# Patient Record
Sex: Male | Born: 1955 | Race: Black or African American | Hispanic: No | Smoking: Current every day smoker
Health system: Southern US, Community
[De-identification: ages and names within clinical notes are randomized; demographics above are authoritative.]

## PROBLEM LIST (undated history)

## (undated) DIAGNOSIS — K209 Esophagitis, unspecified: Secondary | ICD-10-CM

## (undated) DIAGNOSIS — F329 Major depressive disorder, single episode, unspecified: Secondary | ICD-10-CM

## (undated) DIAGNOSIS — J449 Chronic obstructive pulmonary disease, unspecified: Secondary | ICD-10-CM

## (undated) DIAGNOSIS — J189 Pneumonia, unspecified organism: Secondary | ICD-10-CM

## (undated) DIAGNOSIS — M549 Dorsalgia, unspecified: Secondary | ICD-10-CM

## (undated) DIAGNOSIS — D649 Anemia, unspecified: Secondary | ICD-10-CM

## (undated) DIAGNOSIS — F32A Depression, unspecified: Secondary | ICD-10-CM

## (undated) DIAGNOSIS — J9859 Other diseases of mediastinum, not elsewhere classified: Secondary | ICD-10-CM

## (undated) DIAGNOSIS — N400 Enlarged prostate without lower urinary tract symptoms: Secondary | ICD-10-CM

## (undated) DIAGNOSIS — R351 Nocturia: Secondary | ICD-10-CM

## (undated) DIAGNOSIS — R197 Diarrhea, unspecified: Secondary | ICD-10-CM

## (undated) DIAGNOSIS — R339 Retention of urine, unspecified: Secondary | ICD-10-CM

## (undated) DIAGNOSIS — Z8709 Personal history of other diseases of the respiratory system: Secondary | ICD-10-CM

## (undated) DIAGNOSIS — M2550 Pain in unspecified joint: Secondary | ICD-10-CM

## (undated) DIAGNOSIS — R350 Frequency of micturition: Secondary | ICD-10-CM

## (undated) DIAGNOSIS — I10 Essential (primary) hypertension: Secondary | ICD-10-CM

## (undated) DIAGNOSIS — Z923 Personal history of irradiation: Secondary | ICD-10-CM

## (undated) DIAGNOSIS — Z9981 Dependence on supplemental oxygen: Secondary | ICD-10-CM

## (undated) DIAGNOSIS — F419 Anxiety disorder, unspecified: Secondary | ICD-10-CM

## (undated) DIAGNOSIS — R05 Cough: Secondary | ICD-10-CM

## (undated) DIAGNOSIS — R011 Cardiac murmur, unspecified: Secondary | ICD-10-CM

## (undated) DIAGNOSIS — G8929 Other chronic pain: Secondary | ICD-10-CM

## (undated) DIAGNOSIS — R0602 Shortness of breath: Secondary | ICD-10-CM

## (undated) DIAGNOSIS — C3490 Malignant neoplasm of unspecified part of unspecified bronchus or lung: Secondary | ICD-10-CM

## (undated) DIAGNOSIS — G629 Polyneuropathy, unspecified: Secondary | ICD-10-CM

## (undated) DIAGNOSIS — E1140 Type 2 diabetes mellitus with diabetic neuropathy, unspecified: Secondary | ICD-10-CM

## (undated) DIAGNOSIS — E119 Type 2 diabetes mellitus without complications: Secondary | ICD-10-CM

## (undated) HISTORY — DX: Malignant neoplasm of unspecified part of unspecified bronchus or lung: C34.90

## (undated) HISTORY — DX: Type 2 diabetes mellitus without complications: E11.9

## (undated) HISTORY — DX: Personal history of irradiation: Z92.3

## (undated) HISTORY — DX: Dorsalgia, unspecified: M54.9

## (undated) HISTORY — PX: COLONOSCOPY: SHX174

## (undated) HISTORY — DX: Type 2 diabetes mellitus with diabetic neuropathy, unspecified: E11.40

## (undated) HISTORY — DX: Esophagitis, unspecified: K20.9

## (undated) HISTORY — DX: Cough: R05

## (undated) HISTORY — PX: EXCISIONAL HEMORRHOIDECTOMY: SHX1541

---

## 2005-01-17 ENCOUNTER — Emergency Department (HOSPITAL_COMMUNITY): Admission: EM | Admit: 2005-01-17 | Discharge: 2005-01-17 | Payer: Self-pay | Admitting: Emergency Medicine

## 2005-07-03 ENCOUNTER — Ambulatory Visit: Payer: Self-pay | Admitting: Internal Medicine

## 2005-07-07 ENCOUNTER — Ambulatory Visit (HOSPITAL_COMMUNITY): Admission: RE | Admit: 2005-07-07 | Discharge: 2005-07-07 | Payer: Self-pay | Admitting: Internal Medicine

## 2005-08-06 ENCOUNTER — Ambulatory Visit: Payer: Self-pay | Admitting: Internal Medicine

## 2005-08-08 ENCOUNTER — Ambulatory Visit (HOSPITAL_COMMUNITY): Admission: RE | Admit: 2005-08-08 | Discharge: 2005-08-08 | Payer: Self-pay | Admitting: Internal Medicine

## 2005-09-09 ENCOUNTER — Ambulatory Visit: Payer: Self-pay | Admitting: Family Medicine

## 2005-09-16 ENCOUNTER — Ambulatory Visit: Payer: Self-pay | Admitting: *Deleted

## 2006-10-10 ENCOUNTER — Emergency Department (HOSPITAL_COMMUNITY): Admission: EM | Admit: 2006-10-10 | Discharge: 2006-10-10 | Payer: Self-pay | Admitting: Emergency Medicine

## 2006-11-15 ENCOUNTER — Inpatient Hospital Stay (HOSPITAL_COMMUNITY): Admission: EM | Admit: 2006-11-15 | Discharge: 2006-11-19 | Payer: Self-pay | Admitting: Emergency Medicine

## 2006-11-16 ENCOUNTER — Encounter (INDEPENDENT_AMBULATORY_CARE_PROVIDER_SITE_OTHER): Payer: Self-pay | Admitting: Cardiology

## 2006-11-16 ENCOUNTER — Encounter: Payer: Self-pay | Admitting: Vascular Surgery

## 2006-11-24 ENCOUNTER — Inpatient Hospital Stay (HOSPITAL_COMMUNITY): Admission: EM | Admit: 2006-11-24 | Discharge: 2006-11-28 | Payer: Self-pay | Admitting: Emergency Medicine

## 2009-07-28 ENCOUNTER — Emergency Department (HOSPITAL_COMMUNITY): Admission: EM | Admit: 2009-07-28 | Discharge: 2009-07-28 | Payer: Self-pay | Admitting: Emergency Medicine

## 2011-01-05 ENCOUNTER — Encounter: Payer: Self-pay | Admitting: Internal Medicine

## 2011-05-02 NOTE — Consult Note (Signed)
NAMEDRAPER, GALLON            ACCOUNT NO.:  [id number]   MEDICAL RECORD NO.:  [id number]          PATIENT TYPE:  INP   LOCATION:  3737                         FACILITY:  [facility]   PHYSICIAN:  Bevelyn Buckles. Champey, M.D.DATE OF BIRTH:  02/14/1956   DATE OF CONSULTATION:  DATE OF DISCHARGE:                                 CONSULTATION   REASON FOR CONSULT:  Stroke.   HPI:  Mr. Chura is a 55 year old African American male with no  significant past medical history who presents with the onset of slurred  speech and confusion today.  The patient was in the normal state of  health today when around 5:30 p.m., he had an episode of nausea and  vomiting and went to bed.  When he woke up from his nap had severely  dysarthric speech, difficulty getting words out.  The patient did not  notice anything wrong with his speech.  His speech has gradually  improved over time.  He denies any focal weakness, numbness, vision  changes, vertigo, swallowing problems, of loss of consciousness.   PAST MEDICAL HISTORY:  Positive for chronic back pain.   CURRENT MEDICATIONS:  None.   ALLERGIES:  NO KNOWN DRUG ALLERGIES.   FAMILY HISTORY:  Noncontributory.   SOCIAL HISTORY:  Positive for stroke and alcohol use.   REVIEW OF SYSTEMS:  Positive as per HPI.  Review of systems is negative  as per HPI in greater than 7 other organ systems.   EXAMINATION:  VITALS:  Temperature is 97, blood pressure is 100/60,  pulse is 80, respirations 18, O2 sat is 95%.  HEENT:  Normocephalic, atraumatic.  Extraocular muscles are intact.  Pupils are equal, round, and reactive to light.  NECK:  Supple.  No carotid bruits.  HEART:  Regular.  LUNGS:  Clear.  ABDOMEN:  Soft.  EXTREMITIES:  Show good pulses.  NEUROLOGICAL:  The patient is awake, alert, and oriented x3.  Language  is slightly dysarthric.  Cranial nerves II-XII are grossly intact.  Motor exam:  The patient has good strength in all 4 extremities.  The  patient has slight bilateral lower extremity drift on exam.  Sensory  examination is within normal limits to touch.  Reflexes are trace to 1+  throughout toes and neutral bilaterally.  Cerebellar function is within  normal limits, finger-to-nose and heel-to-shin.  Gait is not assessed  secondary to safety.   CT of the head showed no acute bleed with left frontal scalp lipoma.   LABS:  WBC is 10, hemoglobin 15, and hematocrit is 45.1, platelets  236,000.  Sodium is 140, potassium is 4.8, chloride is 109, CO2 is 25,  BUN 15, creatinine is 2.3, glucose is 92, calcium is 9.6.   IMPRESSION:  This is a 54 year old with numerous complaints including  nausea, slurred speech, and confusion, all of which have resolved or  improved.  The patient is not a candidate for IV-TPA, as he is greater  than 3 hours out from onset of symptoms.  The patient could possibly  have a small vessel stroke or possibly a small cerebellar stroke, that  could be  related to his symptoms.  I would recommend starting an aspirin  per day and I would check an MRI/MRA of the brain, carotid Doppler, 2-D  echocardiogram, lipids, and homocysteine level, get PT, OT, and Speech  consults, and keep the patient n.p.o. until clear swallow evaluation,  and place him on IV fluids.  I would also place him on DVT prophylaxis.  Chest x-ray showed possible right upper lobe pneumonia and antibiotics  are started.  I will also check a urinalysis and alcohol level.  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      Bevelyn Buckles. Nash Shearer, M.D.  Electronically Signed     DRC/MEDQ  D:  11/15/2006  T:  11/15/2006  Job:  [id number]
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
## 2011-05-02 NOTE — Procedures (Signed)
EEG [phone].   HISTORY:  This is a 55 year old patient who is being evaluated for new  onset slurred speech and confusion.  This is a routine EEG.  No skull  defects noted.   Medications include Lovenox, aspirin, Protonix, Ativan, Zocor, Atrovent,  Zithromax, Rocephin, Tylenol and Reglan.   EEG CLASSIFICATION:  Normal awake.   DESCRIPTION OF RECORDING:  The backgrounds of this recording consist of  a fairly well modulated medium amplitude alpha rhythm of 9 Hz that is  reactive to eye opening and closure.  As the record progresses, the  patient appears to remain in awakened state during the recording.  Photic stimulation is performed resulting in a bilateral and symmetric  photic driving response.  Hyperventilation is not performed.  At no time  during the recording does there appear to evidence of spikes, spike wave  discharges or evidence of focal slowing.  EKG monitor shows no evidence  of cardiac rhythm abnormalities.  Heart rate of 60.   IMPRESSION:  This is a normal EEG recording in the awakened state.  No  evidence of ictal or interictal discharges were seen.      Marlan Palau, M.D.  Electronically Signed     WJX:BJYN  D:  11/17/2006 21:53:23  T:  11/18/2006 13:42:28  Job #:  [id number]
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
## 2011-05-02 NOTE — H&P (Signed)
NAMEJAIYDEN, LAUR            ACCOUNT NO.:  [id number]   MEDICAL RECORD NO.:  [id number]          PATIENT TYPE:  INP   LOCATION:  3737                         FACILITY:  [facility]   PHYSICIAN:  Della Goo, M.D. DATE OF BIRTH:  Mar 15, 1956   DATE OF ADMISSION:  11/15/2006  DATE OF DISCHARGE:                              HISTORY & PHYSICAL   Patient of [facility]   CHIEF COMPLAINT:  Slurred speech and weakness.   HISTORY OF PRESENT ILLNESS:  This is a 55 year old male who complained  of having slurred speech and weakness, after he awakened from an  afternoon nap.  The patient had difficulty walking and weakness of the  right side of the body, prompting the patient's family to contact EMS  for transfer to the hospital and evaluation.  Prior to this, the patient  does report having episode of vomiting and reports vomiting a greenish-  type emesis.  He also reports having a dry cough for approximately a  month.  He denies having any headache or chest pain associated with  this.  He also denies having any shortness of breath, diarrhea,  associated with his symptoms.   The patient was evaluated in the emergency department, and neurology was  consulted.  The patient was seen by neurologist, Dr. Nash Shearer, who made  recommendations for further evaluation of the patient for a possible  posterior cerebellar infarct.  The patient had a CAT scan performed  which was negative for any acute intracranial hemorrhage or mass effect.  ST-T reveals a lipoma in the right frontal scalp area.   PAST MEDICAL HISTORY:  None.   MEDICATIONS:  None.   ALLERGIES:  NO KNOWN DRUG ALLERGIES.   SOCIAL HISTORY:  Lives with relatives, positive smoker and positive  drinker, reports drinking two 24-ounce cans of beer on the day of his  illness.   REVIEW OF SYSTEMS:  Pertinent for mentioned above.   PHYSICAL EXAMINATION:  FINDINGS:  A 55 year old male in no discomfort or  acute distress currently.  VITAL SIGNS:  Temperature 97.0, blood pressure 107/44, heart rate 61 and  respirations 18.  HEENT:  Examination normocephalic, except for a right frontal nodule,  atraumatic.  Pupils equal, round, reactive to light.  Extraocular  muscles are intact funduscopic benign.  Oropharynx clear.  NECK:  Supple full range of motion.  No thyromegaly, adenopathy, jugular  venous distension.  CARDIOVASCULAR:  Regular rate and rhythm.  LUNGS:  Clear to auscultation bilaterally.  ABDOMEN:  Positive bowel sounds, soft, nontender, nondistended.  EXTREMITIES:  Without edema.  NEUROLOGIC:  Alert and oriented x3, cranial nerves intact.  Motor and  sensory function intact, strength 5/5 throughout and symmetric.  Cerebellar function and gait not assessed.   LABORATORY STUDIES:  White blood cell count 10.0, hemoglobin 15.0,  hematocrit 45.1, MCV 81.7, platelets 236, neutrophils 89%, sodium 140,  potassium 4.8, chloride 109, CO2 25, BUN 15, creatinine 2.3, glucose 82,  calcium 9.6.   CHEST X-RAY FINDINGS:  Reveals a right lower lobe infiltrate and chronic  lung disease changes.  CT of the head negative for an acute intracranial  hemorrhage, negative  for mass effect, positive lipoma right frontal  scalp.   ASSESSMENT:  A 55 year old male with transient symptoms of dysarthria  and weakness admitted for a primary diagnosis of:  1. Cerebrovascular accident versus transient ischemic attack.  2. Right lower lobe pneumonia.  3. Chronic renal insufficiency.  4. Alcohol history.   PLAN:  The patient has been evaluated by neurology and will undergo an  MRI in the a.m.  The patient has been placed on antibiotic therapy of  Rocephin and azithromycin, secondary to right lower lobe pneumonia.  The  patient has also been placed on alcohol withdrawal prophylaxis.  The  patient will also be placed on DVT and GI prophylaxis.  He will also be  administered gentle rehydration and p.r.n. nausea medication.       Della Goo, M.D.  Electronically Signed     HJ/MEDQ  D:  11/15/2006  T:  11/16/2006  Job:  [id number]

## 2011-05-02 NOTE — Discharge Summary (Signed)
Rodney Powers, Rodney Powers            ACCOUNT NO.:  [id number]   MEDICAL RECORD NO.:  [id number]          PATIENT TYPE:  INP   LOCATION:  3737                         FACILITY:  [facility]   PHYSICIAN:  Hind I Elsaid, MD      DATE OF BIRTH:  1956-08-22   DATE OF ADMISSION:  11/14/2006  DATE OF DISCHARGE:  11/19/2006                               DISCHARGE SUMMARY   DISCHARGE DIAGNOSES:  1. Alcohol abuse.  2. Right lower lobe pneumonia, community-acquired pneumonia.  3. Dyslipidemia.  4. Altered mental status, most probably due to transient ischemic      attack.  5. Acute renal failure which resolved.  6. Subclinical hypothyroidism.   DISCHARGE MEDICATIONS:  1. Aspirin 325 mg p.o. daily.  2. Zithromax 500 mg p.o. daily for one week.  3. Albuterol nebulizer q.6 h. p.r.n.  4. Zocor 40 mg p.o. daily.  5. __________  100 mg p.o. daily.  6. Folic acid 1 mg p.o. daily.  7. Protonix 20 mg p.o. dally.   CONSULTING PHYSICIAN:  Neurology was consulted for altered mental  status, TIA and for possibility of a stroke versus seizure.   PROCEDURE:  Procedures during hospitalization:  1. CT head was done on November 14, 2006:  No acute intracranial      abnormalities.  Lipoma of the right frontal scalp and probable      sebaceous cyst.  2. Chest x-ray:  Chronic interstitial lung disease with potentially      superimposed acute pneumonia of the right lower lobe.  3. MRI of the brain:  No acute stroke.  Extracranial lipoma and      sebaceous cyst, remote right parietal cortical infarct.  4. Carotid duplex:  Parietally no intracranial artery stenosis.  There      is no intracranial artery stenosis.  5. Echo done on November 16, 2006:  Left ventricular systolic function      was normal.  Ejection fraction estimated to be 65% and the study      was inadequate for evaluation of ventricular region wall motion      abnormalities.  Aortic valve thickness was mildly increased.  Left      atrium was mildly  dilated.  6. EEG was done on November 17, 2006:  It is normal EEG recording in      the awakened state.  No evidence of ictal or interictal discharges      were seen.   HOSPITAL COURSE:  Problem #37 - A 55 year old male mainly admitted on  November 15, 2006 because when he was awake, he was found to have  dysarthria and weakness and experienced vomiting and was unsteady.  EMS  was called.  Neurology was called immediately.  The patient immediately  returned back to his baseline level of consciousness.  There was no  dysarthria and they recommended to start the patient on aspirin.  MRI of  the brain was that result was completely negative and EEG was done also  during hospitalization.  Results were as above.  So patient has an  episode of altered mental status with dysarthria.  We  think it could be  due a transient ischemic attack.  MRI was negative.  Seizure was ruled  out.  The EEG was negative and neurology was recommending smoking  cessation.  Treatment of hyperlipidemia.  During hospitalization, the  patient completely returned back to his baseline with no dysarthria.  Gait was normal.  Problem #2 - Right lower lobe pneumonia:  The patient received Zithromax  and Rocephin IV to be discharged on Zithromax IV to continue one week of  Zithromax.  Problem #3 - Hyperlipidemia:  Patient to continue on Zocor 40 mg p.o.  daily.  Problem #4 - Acute renal failure:  During hospitalization, the patient  was admitted with a creatinine  of 2.3.  Today on discharge, creatinine  was 1.3, so which is completely resolved.  Problem #5 - Alcohol abuse:  Alcohol and smoking counseling was done  during hospitalization.  Patient to be discharged on thiamine and folic  acid.  Problem #6 - The patient has no primary care physician to follow up.  Patient advised to follow-up for his condition.   On discharge, the patient was completely stable with vital signs of  temperature 97.7, pulse rate 66, respiratory  rate 20, blood pressure  122/75 and saturating 97% on two liters.   DISPOSITION:  The patient is medically stable for discharge home on  aspirin, Zocor and albuterol nebulizers for history of COPD and smoking  cessation, alcohol counseling was done during discharge.      Hind Bosie Helper, MD  Electronically Signed     HIE/MEDQ  D:  11/19/2006  T:  11/20/2006  Job:  [id number]

## 2011-10-13 ENCOUNTER — Emergency Department (INDEPENDENT_AMBULATORY_CARE_PROVIDER_SITE_OTHER): Payer: Self-pay

## 2011-10-13 ENCOUNTER — Emergency Department (HOSPITAL_BASED_OUTPATIENT_CLINIC_OR_DEPARTMENT_OTHER)
Admission: EM | Admit: 2011-10-13 | Discharge: 2011-10-14 | Disposition: A | Discharge status: Home / Self Care | Payer: Self-pay | Attending: Emergency Medicine | Admitting: Emergency Medicine

## 2011-10-13 ENCOUNTER — Encounter: Payer: Self-pay | Admitting: *Deleted

## 2011-10-13 DIAGNOSIS — K7689 Other specified diseases of liver: Secondary | ICD-10-CM

## 2011-10-13 DIAGNOSIS — R109 Unspecified abdominal pain: Secondary | ICD-10-CM

## 2011-10-13 DIAGNOSIS — M5030 Other cervical disc degeneration, unspecified cervical region: Secondary | ICD-10-CM

## 2011-10-13 DIAGNOSIS — F17200 Nicotine dependence, unspecified, uncomplicated: Secondary | ICD-10-CM | POA: Insufficient documentation

## 2011-10-13 DIAGNOSIS — G8929 Other chronic pain: Secondary | ICD-10-CM | POA: Insufficient documentation

## 2011-10-13 DIAGNOSIS — R209 Unspecified disturbances of skin sensation: Secondary | ICD-10-CM | POA: Insufficient documentation

## 2011-10-13 DIAGNOSIS — S0990XA Unspecified injury of head, initial encounter: Secondary | ICD-10-CM

## 2011-10-13 DIAGNOSIS — M47812 Spondylosis without myelopathy or radiculopathy, cervical region: Secondary | ICD-10-CM

## 2011-10-13 DIAGNOSIS — Y92410 Unspecified street and highway as the place of occurrence of the external cause: Secondary | ICD-10-CM | POA: Insufficient documentation

## 2011-10-13 HISTORY — DX: Other chronic pain: G89.29

## 2011-10-13 HISTORY — DX: Dorsalgia, unspecified: M54.9

## 2011-10-13 LAB — CBC
HCT: 47.9 % (ref 39.0–52.0)
Hemoglobin: 16.4 g/dL (ref 13.0–17.0)
MCH: 27 pg (ref 26.0–34.0)
MCHC: 34.2 g/dL (ref 30.0–36.0)
WBC: 5.7 10*3/uL (ref 4.0–10.5)

## 2011-10-13 LAB — BASIC METABOLIC PANEL
CO2: 25 mEq/L (ref 19–32)
Calcium: 9.3 mg/dL (ref 8.4–10.5)
Chloride: 104 mEq/L (ref 96–112)
Creatinine, Ser: 1.3 mg/dL (ref 0.50–1.35)
GFR calc non Af Amer: 61 mL/min — ABNORMAL LOW (ref >90)
Glucose, Bld: 112 mg/dL — ABNORMAL HIGH (ref 70–99)

## 2011-10-13 MED ORDER — HYDROMORPHONE HCL 1 MG/ML IJ SOLN
1.0000 mg | Freq: Once | INTRAMUSCULAR | Status: AC
  Administered 2011-10-13: 1 mg via INTRAVENOUS
  Filled 2011-10-13: qty 1

## 2011-10-13 MED ORDER — SODIUM CHLORIDE 0.9 % IV BOLUS (SEPSIS): 250.0000 mL | Freq: Once | INTRAVENOUS | Status: AC

## 2011-10-13 MED ORDER — IOHEXOL 300 MG/ML  SOLN
100.0000 mL | Freq: Once | INTRAMUSCULAR | Status: AC | PRN
  Administered 2011-10-13: 100 mL via INTRAVENOUS

## 2011-10-13 MED ORDER — SODIUM CHLORIDE 0.9 % IV SOLN: INTRAVENOUS | Status: DC

## 2011-10-13 MED ORDER — ONDANSETRON HCL 4 MG/2ML IJ SOLN
4.0000 mg | Freq: Once | INTRAMUSCULAR | Status: AC
  Administered 2011-10-13: 4 mg via INTRAVENOUS
  Filled 2011-10-13: qty 2

## 2011-10-13 NOTE — ED Notes (Signed)
Pt. Reports back pain in the spine area and to the sides  Of his spine.

## 2011-10-13 NOTE — ED Notes (Signed)
Family at bedside. 

## 2011-10-13 NOTE — ED Notes (Signed)
Pt. Oxygen status expressed to Dr. Herma Carson.

## 2011-10-13 NOTE — ED Notes (Signed)
98.8 oral temp
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
## 2011-10-13 NOTE — ED Notes (Signed)
Called Pt. Girlfriend to let her know the Pt. Is in the ED.  [facility] at [facility].

## 2011-10-13 NOTE — ED Notes (Signed)
c-collar removed from patient by EDP currently at the bedside talking to family and patient
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
## 2011-10-14 MED ORDER — HYDROCODONE-ACETAMINOPHEN 5-325 MG PO TABS: 1.0000 | ORAL_TABLET | Freq: Four times a day (QID) | ORAL | Status: AC | PRN

## 2013-03-06 ENCOUNTER — Encounter (HOSPITAL_COMMUNITY): Payer: Self-pay | Admitting: *Deleted

## 2013-03-06 ENCOUNTER — Inpatient Hospital Stay (HOSPITAL_COMMUNITY)
Admission: EM | Admit: 2013-03-06 | Discharge: 2013-03-10 | DRG: 194 | Disposition: A | Discharge status: Home / Self Care | Payer: Medicaid Other | Attending: Internal Medicine | Admitting: Internal Medicine

## 2013-03-06 ENCOUNTER — Emergency Department (HOSPITAL_COMMUNITY): Payer: Medicaid Other

## 2013-03-06 DIAGNOSIS — Z8249 Family history of ischemic heart disease and other diseases of the circulatory system: Secondary | ICD-10-CM

## 2013-03-06 DIAGNOSIS — R7309 Other abnormal glucose: Secondary | ICD-10-CM | POA: Diagnosis present

## 2013-03-06 DIAGNOSIS — R1110 Vomiting, unspecified: Secondary | ICD-10-CM

## 2013-03-06 DIAGNOSIS — D72829 Elevated white blood cell count, unspecified: Secondary | ICD-10-CM

## 2013-03-06 DIAGNOSIS — J189 Pneumonia, unspecified organism: Principal | ICD-10-CM

## 2013-03-06 DIAGNOSIS — D1809 Hemangioma of other sites: Secondary | ICD-10-CM | POA: Diagnosis present

## 2013-03-06 DIAGNOSIS — N179 Acute kidney failure, unspecified: Secondary | ICD-10-CM

## 2013-03-06 DIAGNOSIS — M549 Dorsalgia, unspecified: Secondary | ICD-10-CM | POA: Diagnosis present

## 2013-03-06 DIAGNOSIS — C787 Secondary malignant neoplasm of liver and intrahepatic bile duct: Secondary | ICD-10-CM

## 2013-03-06 DIAGNOSIS — N400 Enlarged prostate without lower urinary tract symptoms: Secondary | ICD-10-CM | POA: Diagnosis present

## 2013-03-06 DIAGNOSIS — F1010 Alcohol abuse, uncomplicated: Secondary | ICD-10-CM

## 2013-03-06 DIAGNOSIS — R918 Other nonspecific abnormal finding of lung field: Secondary | ICD-10-CM | POA: Diagnosis present

## 2013-03-06 DIAGNOSIS — T380X5A Adverse effect of glucocorticoids and synthetic analogues, initial encounter: Secondary | ICD-10-CM | POA: Diagnosis present

## 2013-03-06 DIAGNOSIS — K560 Paralytic ileus: Secondary | ICD-10-CM | POA: Diagnosis present

## 2013-03-06 DIAGNOSIS — R630 Anorexia: Secondary | ICD-10-CM | POA: Diagnosis present

## 2013-03-06 DIAGNOSIS — E440 Moderate protein-calorie malnutrition: Secondary | ICD-10-CM

## 2013-03-06 DIAGNOSIS — J449 Chronic obstructive pulmonary disease, unspecified: Secondary | ICD-10-CM | POA: Diagnosis present

## 2013-03-06 DIAGNOSIS — R739 Hyperglycemia, unspecified: Secondary | ICD-10-CM

## 2013-03-06 DIAGNOSIS — J4489 Other specified chronic obstructive pulmonary disease: Secondary | ICD-10-CM | POA: Diagnosis present

## 2013-03-06 DIAGNOSIS — K769 Liver disease, unspecified: Secondary | ICD-10-CM

## 2013-03-06 DIAGNOSIS — R599 Enlarged lymph nodes, unspecified: Secondary | ICD-10-CM | POA: Diagnosis present

## 2013-03-06 DIAGNOSIS — F17200 Nicotine dependence, unspecified, uncomplicated: Secondary | ICD-10-CM | POA: Diagnosis present

## 2013-03-06 DIAGNOSIS — R222 Localized swelling, mass and lump, trunk: Secondary | ICD-10-CM | POA: Diagnosis present

## 2013-03-06 DIAGNOSIS — E871 Hypo-osmolality and hyponatremia: Secondary | ICD-10-CM | POA: Diagnosis present

## 2013-03-06 DIAGNOSIS — R112 Nausea with vomiting, unspecified: Secondary | ICD-10-CM | POA: Diagnosis present

## 2013-03-06 HISTORY — DX: Chronic obstructive pulmonary disease, unspecified: J44.9

## 2013-03-06 LAB — GLUCOSE, CAPILLARY: Glucose-Capillary: 119 mg/dL — ABNORMAL HIGH (ref 70–99)

## 2013-03-06 LAB — CBC
HCT: 29.9 % — ABNORMAL LOW (ref 39.0–52.0)
Hemoglobin: 10.1 g/dL — ABNORMAL LOW (ref 13.0–17.0)
MCH: 24.8 pg — ABNORMAL LOW (ref 26.0–34.0)
MCHC: 33.8 g/dL (ref 30.0–36.0)
MCV: 73.5 fL — ABNORMAL LOW (ref 78.0–100.0)
RBC: 4.07 MIL/uL — ABNORMAL LOW (ref 4.22–5.81)

## 2013-03-06 LAB — COMPREHENSIVE METABOLIC PANEL
AST: 46 U/L — ABNORMAL HIGH (ref 0–37)
Alkaline Phosphatase: 99 U/L (ref 39–117)
Calcium: 9.1 mg/dL (ref 8.4–10.5)
Chloride: 93 mEq/L — ABNORMAL LOW (ref 96–112)
GFR calc non Af Amer: 71 mL/min — ABNORMAL LOW (ref >90)
Glucose, Bld: 250 mg/dL — ABNORMAL HIGH (ref 70–99)
Total Bilirubin: 0.6 mg/dL (ref 0.3–1.2)
Total Protein: 8.1 g/dL (ref 6.0–8.3)

## 2013-03-06 LAB — URINALYSIS, MICROSCOPIC ONLY
Ketones, ur: 15 mg/dL — AB
Nitrite: NEGATIVE

## 2013-03-06 LAB — CBC WITH DIFFERENTIAL/PLATELET
Eosinophils Absolute: 0 10*3/uL (ref 0.0–0.7)
Eosinophils Relative: 0 % (ref 0–5)
HCT: 34.7 % — ABNORMAL LOW (ref 39.0–52.0)
Lymphocytes Relative: 9 % — ABNORMAL LOW (ref 12–46)
Lymphs Abs: 1.3 10*3/uL (ref 0.7–4.0)
MCHC: 34.3 g/dL (ref 30.0–36.0)
Monocytes Relative: 8 % (ref 3–12)
Neutro Abs: 12.8 10*3/uL — ABNORMAL HIGH (ref 1.7–7.7)

## 2013-03-06 LAB — CREATININE, URINE, RANDOM: Creatinine, Urine: 93.49 mg/dL

## 2013-03-06 LAB — CG4 I-STAT (LACTIC ACID): Lactic Acid, Venous: 1.06 mmol/L (ref 0.5–2.2)

## 2013-03-06 LAB — LIPASE, BLOOD: Lipase: 32 U/L (ref 11–59)

## 2013-03-06 LAB — CREATININE, SERUM: Creatinine, Ser: 1.03 mg/dL (ref 0.50–1.35)

## 2013-03-06 LAB — SODIUM, URINE, RANDOM: Sodium, Ur: 106 mEq/L

## 2013-03-06 MED ORDER — PIPERACILLIN-TAZOBACTAM 3.375 G IVPB
3.3750 g | Freq: Once | INTRAVENOUS | Status: DC
  Administered 2013-03-06: 3.375 g via INTRAVENOUS
  Filled 2013-03-06: qty 50

## 2013-03-06 MED ORDER — SODIUM CHLORIDE 0.9 % IV BOLUS (SEPSIS)
1000.0000 mL | Freq: Once | INTRAVENOUS | Status: AC
  Administered 2013-03-06: 1000 mL via INTRAVENOUS

## 2013-03-06 MED ORDER — SODIUM CHLORIDE 0.9 % IV SOLN
INTRAVENOUS | Status: DC
  Administered 2013-03-07 – 2013-03-09 (×5): via INTRAVENOUS

## 2013-03-06 MED ORDER — OXYCODONE HCL 5 MG PO TABS: 5.0000 mg | ORAL_TABLET | ORAL | Status: DC | PRN

## 2013-03-06 MED ORDER — ACETAMINOPHEN 650 MG RE SUPP: 650.0000 mg | Freq: Four times a day (QID) | RECTAL | Status: DC | PRN

## 2013-03-06 MED ORDER — SODIUM CHLORIDE 0.9 % IV SOLN
INTRAVENOUS | Status: AC
  Administered 2013-03-06: 18:00:00 via INTRAVENOUS

## 2013-03-06 MED ORDER — THIAMINE HCL 100 MG/ML IJ SOLN
100.0000 mg | Freq: Every day | INTRAMUSCULAR | Status: DC
  Administered 2013-03-06: 100 mg via INTRAVENOUS
  Filled 2013-03-06 (×4): qty 1

## 2013-03-06 MED ORDER — ONDANSETRON HCL 4 MG PO TABS: 4.0000 mg | ORAL_TABLET | Freq: Four times a day (QID) | ORAL | Status: DC | PRN

## 2013-03-06 MED ORDER — HEPARIN SODIUM (PORCINE) 5000 UNIT/ML IJ SOLN
5000.0000 [IU] | Freq: Three times a day (TID) | INTRAMUSCULAR | Status: DC
  Administered 2013-03-06 – 2013-03-07 (×2): 5000 [IU] via SUBCUTANEOUS
  Filled 2013-03-06 (×5): qty 1

## 2013-03-06 MED ORDER — INSULIN ASPART 100 UNIT/ML ~~LOC~~ SOLN: 0.0000 [IU] | Freq: Every day | SUBCUTANEOUS | Status: DC

## 2013-03-06 MED ORDER — ACETAMINOPHEN 325 MG PO TABS
650.0000 mg | ORAL_TABLET | Freq: Four times a day (QID) | ORAL | Status: DC | PRN
  Administered 2013-03-06: 650 mg via ORAL
  Filled 2013-03-06: qty 2

## 2013-03-06 MED ORDER — LORAZEPAM 2 MG/ML IJ SOLN: 0.0000 mg | Freq: Four times a day (QID) | INTRAMUSCULAR | Status: AC

## 2013-03-06 MED ORDER — METHYLPREDNISOLONE SODIUM SUCC 125 MG IJ SOLR
60.0000 mg | Freq: Two times a day (BID) | INTRAMUSCULAR | Status: DC
  Administered 2013-03-07 – 2013-03-08 (×3): 60 mg via INTRAVENOUS
  Filled 2013-03-06 (×6): qty 0.96

## 2013-03-06 MED ORDER — INSULIN ASPART 100 UNIT/ML ~~LOC~~ SOLN
4.0000 [IU] | Freq: Three times a day (TID) | SUBCUTANEOUS | Status: DC
  Administered 2013-03-06 – 2013-03-09 (×5): 4 [IU] via SUBCUTANEOUS

## 2013-03-06 MED ORDER — VITAMIN B-1 100 MG PO TABS
100.0000 mg | ORAL_TABLET | Freq: Every day | ORAL | Status: DC
  Administered 2013-03-07 – 2013-03-09 (×3): 100 mg via ORAL
  Filled 2013-03-06 (×5): qty 1

## 2013-03-06 MED ORDER — IOHEXOL 300 MG/ML  SOLN
80.0000 mL | Freq: Once | INTRAMUSCULAR | Status: AC | PRN
  Administered 2013-03-06: 80 mL via INTRAVENOUS

## 2013-03-06 MED ORDER — DEXTROSE 5 % IV SOLN
1.0000 g | INTRAVENOUS | Status: DC
  Administered 2013-03-06 – 2013-03-09 (×4): 1 g via INTRAVENOUS
  Filled 2013-03-06 (×5): qty 10

## 2013-03-06 MED ORDER — VANCOMYCIN HCL IN DEXTROSE 1-5 GM/200ML-% IV SOLN
1000.0000 mg | Freq: Once | INTRAVENOUS | Status: DC
  Filled 2013-03-06: qty 200

## 2013-03-06 MED ORDER — FOLIC ACID 1 MG PO TABS
1.0000 mg | ORAL_TABLET | Freq: Every day | ORAL | Status: DC
  Administered 2013-03-06 – 2013-03-09 (×4): 1 mg via ORAL
  Filled 2013-03-06 (×5): qty 1

## 2013-03-06 MED ORDER — LORAZEPAM 2 MG/ML IJ SOLN: 1.0000 mg | Freq: Four times a day (QID) | INTRAMUSCULAR | Status: AC | PRN

## 2013-03-06 MED ORDER — INSULIN ASPART 100 UNIT/ML ~~LOC~~ SOLN
0.0000 [IU] | Freq: Three times a day (TID) | SUBCUTANEOUS | Status: DC
  Administered 2013-03-07: 8 [IU] via SUBCUTANEOUS
  Administered 2013-03-07 – 2013-03-08 (×3): 3 [IU] via SUBCUTANEOUS
  Administered 2013-03-08: 5 [IU] via SUBCUTANEOUS
  Administered 2013-03-08: 2 [IU] via SUBCUTANEOUS
  Administered 2013-03-09: 1 [IU] via SUBCUTANEOUS
  Administered 2013-03-09: 5 [IU] via SUBCUTANEOUS
  Administered 2013-03-10: 3 [IU] via SUBCUTANEOUS

## 2013-03-06 MED ORDER — SODIUM CHLORIDE 0.9 % IV BOLUS (SEPSIS)
2000.0000 mL | Freq: Once | INTRAVENOUS | Status: AC
  Administered 2013-03-06: 2000 mL via INTRAVENOUS

## 2013-03-06 MED ORDER — POLYETHYLENE GLYCOL 3350 17 G PO PACK
17.0000 g | PACK | Freq: Every day | ORAL | Status: AC
  Administered 2013-03-07 – 2013-03-08 (×2): 17 g via ORAL
  Filled 2013-03-06 (×3): qty 1

## 2013-03-06 MED ORDER — ADULT MULTIVITAMIN W/MINERALS CH
1.0000 | ORAL_TABLET | Freq: Every day | ORAL | Status: DC
  Administered 2013-03-06 – 2013-03-09 (×4): 1 via ORAL
  Filled 2013-03-06 (×5): qty 1

## 2013-03-06 MED ORDER — METOCLOPRAMIDE HCL 5 MG/ML IJ SOLN
10.0000 mg | Freq: Once | INTRAMUSCULAR | Status: AC
  Administered 2013-03-06: 10 mg via INTRAVENOUS
  Filled 2013-03-06: qty 2

## 2013-03-06 MED ORDER — METHYLPREDNISOLONE SODIUM SUCC 125 MG IJ SOLR
80.0000 mg | Freq: Once | INTRAMUSCULAR | Status: AC
  Administered 2013-03-06: 18:00:00 via INTRAVENOUS

## 2013-03-06 MED ORDER — INSULIN DETEMIR 100 UNIT/ML ~~LOC~~ SOLN
5.0000 [IU] | Freq: Every day | SUBCUTANEOUS | Status: DC
  Administered 2013-03-06 – 2013-03-09 (×4): 5 [IU] via SUBCUTANEOUS
  Filled 2013-03-06 (×6): qty 0.05

## 2013-03-06 MED ORDER — AZITHROMYCIN 500 MG PO TABS
500.0000 mg | ORAL_TABLET | ORAL | Status: DC
  Administered 2013-03-06 – 2013-03-09 (×4): 500 mg via ORAL
  Filled 2013-03-06 (×5): qty 1

## 2013-03-06 MED ORDER — ONDANSETRON HCL 4 MG/2ML IJ SOLN: 4.0000 mg | Freq: Four times a day (QID) | INTRAMUSCULAR | Status: DC | PRN

## 2013-03-06 MED ORDER — LORAZEPAM 2 MG/ML IJ SOLN: 0.0000 mg | Freq: Two times a day (BID) | INTRAMUSCULAR | Status: DC

## 2013-03-06 MED ORDER — LORAZEPAM 1 MG PO TABS: 1.0000 mg | ORAL_TABLET | Freq: Four times a day (QID) | ORAL | Status: AC | PRN

## 2013-03-06 NOTE — H&P (Signed)
[facility] History and Physical  Rodney Powers ZOX:[id number] DOB: [birth year] DOA: 03/06/2013  Referring physician: Dr. Louellen Molder  PCP: Default, Provider, MD  Specialists: none  Chief Complaint: Nausea and vomiting  HPI: Rodney Powers is a 57 y.o. male  Alcoholic his last drink was 4 days prior to admission, past medical history of tobacco abuse, but no primary care doctor, that comes in for nausea and vomiting 4 days prior to admission progressively getting worst to the point where he can even history drink anything. He also relates a persistent cough for the past 2 weeks and malaise. In the emergency room his temperature was checked which was 100.7, chest x-ray was done that showed a hilar mass a CT scan was then done that showed a lung mass with multiple lymph nodules. A lipase was checked which was within normal limits. He denies any abdominal pain. Blood glucose is 250 he is hyponatremic and hypochloremic with leukocytosis of 15. He was started empirically on vancomycin Zosyn and we were asked to admit for further evaluation. - He's been anorexic for week , denies any weight loss. Abdominal pain, melena or abnormal bleeding.  Review of Systems: The patient denies  fever, weight loss,, vision loss, decreased hearing, hoarseness, chest pain, syncope, dyspnea on exertion, peripheral edema, balance deficits, hemoptysis, abdominal pain, melena, hematochezia, severe indigestion/heartburn, hematuria, incontinence, genital sores, muscle weakness, suspicious skin lesions, transient blindness, difficulty walking, depression, unusual weight change, abnormal bleeding, enlarged lymph nodes, angioedema, and breast masses.    Past Medical History  Diagnosis Date  . Back pain, chronic   . COPD (chronic obstructive pulmonary disease)    History reviewed. No pertinent past surgical history. Social History:  reports that he has been smoking Cigarettes.  He has been smoking about 1.00 pack per  day. He does not have any smokeless tobacco history on file. He reports that he drinks about 3.6 ounces of alcohol per week. He reports that he uses illicit drugs (Marijuana). Lives by himself  No Known Allergies  Family History  Problem Relation Age of Onset  . Aneurysm Mother   . Heart attack Father     Prior to Admission medications   Not on File   Physical Exam: Filed Vitals:   03/06/13 1006 03/06/13 1435  BP: 140/78 120/63  Pulse: 115 96  Temp: 97.5 F (36.4 C) 100.7 F (38.2 C)  TempSrc: Oral Oral  Resp: 18 20  SpO2: 95% 94%    BP 120/63  Pulse 96  Temp(Src) 100.7 F (38.2 C) (Oral)  Resp 20  SpO2 94%  General Appearance:    Alert, cooperative, no distress, disheveled   Head:    Normocephalic, without obvious abnormality, atraumatic  Eyes:    PERRL, conjunctiva/corneas clear, EOM's intact, fundi    benign, both eyes             Throat:   dry mucous membranes poor oral hygiene   Neck:   Supple, symmetrical, trachea midline, no adenopathy;       thyroid:  No enlargement/tenderness/nodules; no carotid   bruit or JVD  Back:     Symmetric, no curvature, ROM normal, no CVA tenderness  Lungs:     good air movement and wheezing bilaterally.   Chest wall:    No tenderness or deformity  Heart:    Regular rate and rhythm, S1 and S2 normal, no murmur, rub   or gallop  Abdomen:     Soft, non-tender, bowel sounds active all four  quadrants,    no masses, no organomegaly        Extremities:   Extremities normal, atraumatic, no cyanosis or edema  Pulses:   2+ and symmetric all extremities  Skin:   Skin color, texture, turgor normal, no rashes or lesions  Lymph nodes:   Cervical, supraclavicular, and axillary nodes normal  Neurologic:   CNII-XII intact. Normal strength, sensation and reflexes      throughout    Labs on Admission:  Basic Metabolic Panel:  Recent Labs Lab 03/06/13 1009  NA 131*  K 4.0  CL 93*  CO2 24  GLUCOSE 250*  BUN 10  CREATININE 1.14   CALCIUM 9.1   Liver Function Tests:  Recent Labs Lab 03/06/13 1009  AST 46*  ALT 51  ALKPHOS 99  BILITOT 0.6  PROT 8.1  ALBUMIN 2.6*    Recent Labs Lab 03/06/13 1009  LIPASE 32   No results found for this basename: AMMONIA,  in the last 168 hours CBC:  Recent Labs Lab 03/06/13 1009  WBC 15.3*  NEUTROABS 12.8*  HGB 11.9*  HCT 34.7*  MCV 75.3*  PLT 476*   Cardiac Enzymes: No results found for this basename: CKTOTAL, CKMB, CKMBINDEX, TROPONINI,  in the last 168 hours  BNP (last 3 results) No results found for this basename: PROBNP,  in the last 8760 hours CBG: No results found for this basename: GLUCAP,  in the last 168 hours  Radiological Exams on Admission: Ct Chest W Contrast  03/06/2013  *RADIOLOGY REPORT*  Clinical Data: Mass noted on x-ray  CT CHEST WITH CONTRAST  Technique:  Multidetector CT imaging of the chest was performed following the standard protocol during bolus administration of intravenous contrast.  Contrast: 80mL OMNIPAQUE IOHEXOL 300 MG/ML  SOLN  Comparison: Chest x-ray same day  Findings: Sagittal images of the spine are unremarkable.  The images of the thoracic inlet are unremarkable.  Central airways are patent.  As noted on chest x-rays there is a nodular mass or confluent adenopathy in the left mediastinum extending in the left upper lobe suprahilar region.  There is low density centrally suspicious for necrosis the lesion measures at least 6.5 x 5.5 cm highly suspicious for malignancy.  Correlation with bronchoscopy and/or biopsy is recommended.  A AP window lymph node measures 1.4 x 1.5 cm.  Right pretracheal lymph node measures 1.6 x 1.3 cm.  Left hilar lymph node measures 2 x 1.9 cm.  No right hilar adenopathy is noted.  Images of the lung parenchyma shows bilateral interstitial prominence.  There is interstitial thickening with patchy airspace disease in the left upper lobe anteriorly suspicious for postobstructive pneumonia.  Large paraseptal  emphysematous bullae are noted in the upper lobes bilaterally.  Small hiatal hernia.  There is a low density lesion in the right hepatic lobe laterally measures 1.7 cm.  Second low density lesion in the right hepatic lobe measures 2.2 cm.  These are highly suspicious for metastatic disease.  Further evaluation is recommended.  No adrenal gland mass is noted.  Bilateral nonspecific axillary lymph nodes are noted.  IMPRESSION:  1.  There is a mass or confluent adenopathy in the left mediastinum//suprahilar region measures 6.5 x 5.5 cm.  This is highly suspicious for malignancy.  Further evaluation with bronchoscopy and/or biopsy is recommended. 2.  There are pathologic mediastinal and left hilar lymph nodes. 3.  Airspace disease in the left upper lobe anteriorly suspicious for postobstructive pneumonia. 4.  Extensive  emphysematous changes bilateral  upper lobe. 5.  Low density lesions are noted in the right hepatic lobe suspicious for metastatic disease.  Further evaluation is recommended.  Critical findings discussed with Dr. Ethelda Chick   Original Report Authenticated By: Natasha Mead, M.D.    Dg Abd Acute W/chest  03/06/2013  *RADIOLOGY REPORT*  Clinical Data: Abdominal pain, emesis.  ACUTE ABDOMEN SERIES (ABDOMEN 2 VIEW & CHEST 1 VIEW)  Comparison: 10/13/2011  Findings: Poorly marginated left hilar mass-like opacity.  Streaky left perihilar interstitial and alveolar opacities.  Stable prominent right perihilar interstitial markings.  No effusion. Heart size normal.  No free air. Small bowel and colon are nondilated.  Scattered fluid levels on the erect radiograph.  IMPRESSION:  1.  Left perihilar mass-like opacity with adjacent peripheral disease suggesting obstructing mass or pneumonia.  CT chest with contrast may be useful for further characterization. 2.  Nonobstructive bowel gas pattern with scattered fluid levels, suggesting possible early ileus.   Original Report Authenticated By: D. Andria Rhein, MD      EKG: Independently reviewed. None  Assessment/Plan Nausea and vomiting due to to PNA (pneumonia)/Mass of lung/Leukocytosis: - This seems to be most likely a postobstructive pneumonia. He denies any episodes of passing out. She does 5 x 6 cm mass in the right upper area with a pneumonia by CT. I will get a sputum cultures and start him empirically antibiotic Rocephin and azithromycin. - We'll consult pulmonology to see if mass or lymph nodes and amenable to US guided bronchoscopy. - We'll use Tylenol for fevers and, Zofran for nausea. - Check a swallowing evaluation as he relates only nausea and vomiting difficulty swallowing. - He also has mild wheezing on physical exam. This probably secondary to infectious process. We'll go ahead and start him on low-dose nitroglycerin.  Hyponatremia: -  His most likely  decreased intravascular volume, chloride is also low. creatinine is only 1.4. My guess his BUN and creatinine will drop significantly after IV fluids. We'll check urinary sodium and urinary creatinine.   AKI (acute kidney injury): - This most likely secondary to nausea and vomiting, we'll go ahead and start on IV fluids and check a basic metabolic morning.  Protein-calorie malnutrition, moderate: - His albumin is 2.6 I'm sure after hydration his albumin will drop. I will go ahead and get a swallowing evaluation as he's relates difficulty swallowing   Alcohol abuse: - He relates his last drink was about 4 days ago. It is unlikely that he will withdrawal on this admission. I would still go ahead and start him on thiamine and folate and monitor him with serial protocol.     Hyperglycemia: - This most likely secondary to his infectious process. He was started on Solu-Medrol as he had some wheezing so this will make his blood glucose increase, start him on low dose Levemir plus sliding scale insulin.  -  Check a hemoglobin A1c.     Code Status: full Family Communication:  none Disposition Plan: inpatient  Time spent: 65 minutes  Marinda Elk [facility] Pager [phone]  If 7PM-7AM, please contact night-coverage [email or web address] Password [facility] 03/06/2013, 4:39 PM

## 2013-03-06 NOTE — ED Notes (Signed)
Pt reports having vomiting x 2 weeks, feels like something is stuck in his throat and unable to keep anything down, including liquids. Also had mild diarrhea over past two weeks. No acute distress noted at triage.

## 2013-03-06 NOTE — ED Provider Notes (Signed)
History     CSN: [id number]  Arrival date & time 03/06/13  0946   None     Chief Complaint  Patient presents with  . Emesis    (Consider location/radiation/quality/duration/timing/severity/associated sxs/prior treatment) HPI Presents with vomiting and diarrhea onset 2 weeks ago accompanied by diffuse crampy abdominal pain and feeling that food is stuck in his throat. He does not vomit food however he vomited yellow material several times, one to several hours after he eats. He had 2 episodes of diarrhea today, one episode yesterday. He also complains of crampy abdominal pain and chills no fever no treatment prior to coming here nothing makes symptoms better or worse. No other complaint no treatment prior to coming. No other associated symptoms Past Medical History  Diagnosis Date  . Back pain, chronic     History reviewed. No pertinent past surgical history.  Surgical history negative History reviewed. No pertinent family history.  History  Substance Use Topics  . Smoking status: Current Every Day Smoker -- 1.00 packs/day    Types: Cigarettes  . Smokeless tobacco: Not on file  . Alcohol Use: No     Comment: 4-5 days ago Pt. reports having a couple of beers   Denies drug use admits to 2 alcoholic drinks per day   Review of Systems  Constitutional: Positive for chills.  HENT: Negative.   Respiratory: Negative.   Cardiovascular: Negative.   Gastrointestinal: Positive for nausea, vomiting, abdominal pain and diarrhea.  Musculoskeletal: Negative.   Skin: Negative.   Neurological: Negative.   Psychiatric/Behavioral: Negative.   All other systems reviewed and are negative.    Allergies  Review of patient's allergies indicates no known allergies.  Home Medications  No current outpatient prescriptions on file.  BP 140/78  Pulse 115  Temp(Src) 97.5 F (36.4 C) (Oral)  Resp 18  SpO2 95%  Physical Exam  Nursing note and vitals reviewed. Constitutional: He appears  well-developed and well-nourished. He appears distressed.  Actively vomiting  HENT:  Head: Normocephalic and atraumatic.  Eyes: Conjunctivae are normal. Pupils are equal, round, and reactive to light.  Neck: Neck supple. No tracheal deviation present. No thyromegaly present.  Cardiovascular: Normal rate and regular rhythm.   No murmur heard. Pulmonary/Chest: Effort normal and breath sounds normal.  Abdominal: Soft. Bowel sounds are normal. He exhibits no distension. There is no tenderness.  Genitourinary: Penis normal.  Normal male genitalia  Musculoskeletal: Normal range of motion. He exhibits no edema and no tenderness.  Neurological: He is alert. Coordination normal.  Skin: Skin is warm and dry. No rash noted.  Psychiatric: He has a normal mood and affect.    ED Course  Procedures (including critical care time)  Labs Reviewed  CBC WITH DIFFERENTIAL - Abnormal; Notable for the following:    WBC 15.3 (*)    Hemoglobin 11.9 (*)    HCT 34.7 (*)    MCV 75.3 (*)    MCH 25.8 (*)    Platelets 476 (*)    Neutrophils Relative 83 (*)    Neutro Abs 12.8 (*)    Lymphocytes Relative 9 (*)    Monocytes Absolute 1.2 (*)    All other components within normal limits  COMPREHENSIVE METABOLIC PANEL - Abnormal; Notable for the following:    Sodium 131 (*)    Chloride 93 (*)    Glucose, Bld 250 (*)    Albumin 2.6 (*)    AST 46 (*)    GFR calc non Af Amer 71 (*)  GFR calc Af Amer 82 (*)    All other components within normal limits  URINALYSIS, MICROSCOPIC ONLY - Abnormal; Notable for the following:    Color, Urine ORANGE (*)    Hgb urine dipstick MODERATE (*)    Bilirubin Urine SMALL (*)    Ketones, ur 15 (*)    Protein, ur 30 (*)    Urobilinogen, UA 2.0 (*)    Leukocytes, UA TRACE (*)    All other components within normal limits  LIPASE, BLOOD   No results found. 12 05 PM nausea has resolved after treatment with intervenous Reglan and intravenous fluids  No diagnosis  found.  5:20 PM patient feels improved after intravenous fluids and Reglan. He is resting comfortably. Discussed CT scan result with radiologist. X-rays viewed by me  MDM  Case discussed with Dr. Radonna Ricker plan admit medical surgical floor, intravenous antibiotics we'll treat postobstructive pneumonia with vancomycin and Zosyn as patient felt to be immunocompromised. He will receive evaluation for lung lesion while hospitalized Diagnosis #1 vomiting and diarrhea #2 hyperglycemia #3 hyponatremia #4 lung mass #5 fever         Doug Sou, MD 03/06/13 4010

## 2013-03-07 ENCOUNTER — Encounter (HOSPITAL_COMMUNITY): Payer: Self-pay | Admitting: Radiology

## 2013-03-07 ENCOUNTER — Inpatient Hospital Stay (HOSPITAL_COMMUNITY): Payer: Medicaid Other

## 2013-03-07 DIAGNOSIS — J189 Pneumonia, unspecified organism: Principal | ICD-10-CM

## 2013-03-07 DIAGNOSIS — R222 Localized swelling, mass and lump, trunk: Secondary | ICD-10-CM

## 2013-03-07 DIAGNOSIS — N179 Acute kidney failure, unspecified: Secondary | ICD-10-CM

## 2013-03-07 DIAGNOSIS — R1110 Vomiting, unspecified: Secondary | ICD-10-CM

## 2013-03-07 DIAGNOSIS — R197 Diarrhea, unspecified: Secondary | ICD-10-CM

## 2013-03-07 DIAGNOSIS — C787 Secondary malignant neoplasm of liver and intrahepatic bile duct: Secondary | ICD-10-CM

## 2013-03-07 DIAGNOSIS — R7309 Other abnormal glucose: Secondary | ICD-10-CM

## 2013-03-07 DIAGNOSIS — F1010 Alcohol abuse, uncomplicated: Secondary | ICD-10-CM

## 2013-03-07 DIAGNOSIS — D72829 Elevated white blood cell count, unspecified: Secondary | ICD-10-CM

## 2013-03-07 DIAGNOSIS — E871 Hypo-osmolality and hyponatremia: Secondary | ICD-10-CM

## 2013-03-07 DIAGNOSIS — C801 Malignant (primary) neoplasm, unspecified: Secondary | ICD-10-CM

## 2013-03-07 LAB — BASIC METABOLIC PANEL
CO2: 23 mEq/L (ref 19–32)
CO2: 21 mEq/L (ref 19–32)
Chloride: 103 mEq/L (ref 96–112)
Chloride: 103 mEq/L (ref 96–112)
Creatinine, Ser: 0.81 mg/dL (ref 0.50–1.35)
GFR calc Af Amer: >90 mL/min (ref >90)
Glucose, Bld: 180 mg/dL — ABNORMAL HIGH (ref 70–99)
Potassium: 4.5 mEq/L (ref 3.5–5.1)
Sodium: 136 mEq/L (ref 135–145)
Sodium: 135 mEq/L (ref 135–145)

## 2013-03-07 LAB — GLUCOSE, CAPILLARY
Glucose-Capillary: 173 mg/dL — ABNORMAL HIGH (ref 70–99)
Glucose-Capillary: 115 mg/dL — ABNORMAL HIGH (ref 70–99)

## 2013-03-07 LAB — CBC
Hemoglobin: 11.1 g/dL — ABNORMAL LOW (ref 13.0–17.0)
MCH: 24.9 pg — ABNORMAL LOW (ref 26.0–34.0)
Platelets: 442 10*3/uL — ABNORMAL HIGH (ref 150–400)
RBC: 4.45 MIL/uL (ref 4.22–5.81)
WBC: 13 10*3/uL — ABNORMAL HIGH (ref 4.0–10.5)

## 2013-03-07 LAB — HEMOGLOBIN A1C: Hgb A1c MFr Bld: 7.6 % — ABNORMAL HIGH (ref <5.7)

## 2013-03-07 LAB — HIV ANTIBODY (ROUTINE TESTING W REFLEX): HIV: NONREACTIVE

## 2013-03-07 LAB — PROTIME-INR: Prothrombin Time: 16.3 seconds — ABNORMAL HIGH (ref 11.6–15.2)

## 2013-03-07 LAB — APTT: aPTT: 35 seconds (ref 24–37)

## 2013-03-07 MED ORDER — MIDAZOLAM HCL 2 MG/2ML IJ SOLN
INTRAMUSCULAR | Status: AC
  Filled 2013-03-07: qty 4

## 2013-03-07 MED ORDER — FENTANYL CITRATE 0.05 MG/ML IJ SOLN
INTRAMUSCULAR | Status: AC
  Filled 2013-03-07: qty 4

## 2013-03-07 MED ORDER — HEPARIN SODIUM (PORCINE) 5000 UNIT/ML IJ SOLN
5000.0000 [IU] | Freq: Three times a day (TID) | INTRAMUSCULAR | Status: DC
  Administered 2013-03-07 – 2013-03-10 (×7): 5000 [IU] via SUBCUTANEOUS
  Filled 2013-03-07 (×11): qty 1

## 2013-03-07 NOTE — Consult Note (Signed)
PULMONARY  / CRITICAL CARE MEDICINE  Name: Rodney Powers MRN: [id number] DOB: [birth year]    ADMISSION DATE:  03/06/2013 CONSULTATION DATE:  3-24  REFERRING MD :  triad  CHIEF COMPLAINT: Abd pain  BRIEF PATIENT DESCRIPTION:  57 yo unemployed [name] with 1 PPD tobacco use since age 24 who presented on 3-23 with CC: abd pain, N/v x 4 days prior to admit. + for chills sweats and non productive cough. He can normally ambulate 1/2 mile without SOB. No complaints of PND, orthopnea    CULTURES: 3/23 bc x 2>> 3-23 sputum>>   ANTIBIOTICS: 3-23 zithro>> 3-23 Roc>>  HISTORY OF PRESENT ILLNESS:   57 yo unemployed [name] with 1 PPD tobacco use since age 66 who presented on 3-23 with CC: abd pain, N/v x 4 days prior to admit. + for chills sweats and non productive cough. He can normally ambulate 1/2 mile without SOB. No complaints of PND, orthopnea. He is reported to consume etoh on regular bases last 4 days prior to admit.  CT of abd revealed lung lesions/post obstuctive changes and CT chest was concerning for metastatic dz. PCCM asked to evaluate for possible FOB.    VITAL SIGNS: Temp:  [97.3 F (36.3 C)-101.6 F (38.7 C)] 97.3 F (36.3 C) (03/24 0550) Pulse Rate:  [66-115] 66 (03/24 0550) Resp:  [16-22] 18 (03/24 0550) BP: (108-144)/(55-80) 116/69 mmHg (03/24 0550) SpO2:  [94 %-99 %] 99 % (03/24 0550) Weight:  [99.791 kg (220 lb)] 99.791 kg (220 lb) (03/23 1745)  PHYSICAL EXAMINATION: General: WNWDAAM with dull affect Neuro:  Dull affect but intact HEENT:  Poor dentation with multiple missing teeth. No caries noted Neck:  No LAN Cardiovascular:  HSR RRR Lungs:  Decrease bs bases.No increase wob at rest Abdomen:  +bs , obese, tender Musculoskeletal:  intact Skin:  Warm, no edema   Recent Labs Lab 03/06/13 1009 03/06/13 1759 03/07/13 0511  NA 131*  --  136  K 4.0  --  4.5  CL 93*  --  103  CO2 24  --  23  BUN 10  --  10  CREATININE 1.14 1.03 0.93  GLUCOSE 250*  --  180*     Recent Labs Lab 03/06/13 1009 03/06/13 1759 03/07/13 0511  HGB 11.9* 10.1* 11.1*  HCT 34.7* 29.9* 33.5*  WBC 15.3* 13.7* 13.0*  PLT 476* 387 442*   Ct Chest W Contrast  03/06/2013  *RADIOLOGY REPORT*  Clinical Data: Mass noted on x-ray  CT CHEST WITH CONTRAST  Technique:  Multidetector CT imaging of the chest was performed following the standard protocol during bolus administration of intravenous contrast.  Contrast: 80mL OMNIPAQUE IOHEXOL 300 MG/ML  SOLN  Comparison: Chest x-ray same day  Findings: Sagittal images of the spine are unremarkable.  The images of the thoracic inlet are unremarkable.  Central airways are patent.  As noted on chest x-rays there is a nodular mass or confluent adenopathy in the left mediastinum extending in the left upper lobe suprahilar region.  There is low density centrally suspicious for necrosis the lesion measures at least 6.5 x 5.5 cm highly suspicious for malignancy.  Correlation with bronchoscopy and/or biopsy is recommended.  A AP window lymph node measures 1.4 x 1.5 cm.  Right pretracheal lymph node measures 1.6 x 1.3 cm.  Left hilar lymph node measures 2 x 1.9 cm.  No right hilar adenopathy is noted.  Images of the lung parenchyma shows bilateral interstitial prominence.  There is interstitial thickening with  patchy airspace disease in the left upper lobe anteriorly suspicious for postobstructive pneumonia.  Large paraseptal emphysematous bullae are noted in the upper lobes bilaterally.  Small hiatal hernia.  There is a low density lesion in the right hepatic lobe laterally measures 1.7 cm.  Second low density lesion in the right hepatic lobe measures 2.2 cm.  These are highly suspicious for metastatic disease.  Further evaluation is recommended.  No adrenal gland mass is noted.  Bilateral nonspecific axillary lymph nodes are noted.  IMPRESSION:  1.  There is a mass or confluent adenopathy in the left mediastinum//suprahilar region measures 6.5 x 5.5 cm.  This  is highly suspicious for malignancy.  Further evaluation with bronchoscopy and/or biopsy is recommended. 2.  There are pathologic mediastinal and left hilar lymph nodes. 3.  Airspace disease in the left upper lobe anteriorly suspicious for postobstructive pneumonia. 4.  Extensive  emphysematous changes bilateral upper lobe. 5.  Low density lesions are noted in the right hepatic lobe suspicious for metastatic disease.  Further evaluation is recommended.  Critical findings discussed with Dr. Ethelda Chick   Original Report Authenticated By: Natasha Mead, M.D.    Dg Abd Acute W/chest  03/06/2013  *RADIOLOGY REPORT*  Clinical Data: Abdominal pain, emesis.  ACUTE ABDOMEN SERIES (ABDOMEN 2 VIEW & CHEST 1 VIEW)  Comparison: 10/13/2011  Findings: Poorly marginated left hilar mass-like opacity.  Streaky left perihilar interstitial and alveolar opacities.  Stable prominent right perihilar interstitial markings.  No effusion. Heart size normal.  No free air. Small bowel and colon are nondilated.  Scattered fluid levels on the erect radiograph.  IMPRESSION:  1.  Left perihilar mass-like opacity with adjacent peripheral disease suggesting obstructing mass or pneumonia.  CT chest with contrast may be useful for further characterization. 2.  Nonobstructive bowel gas pattern with scattered fluid levels, suggesting possible early ileus.   Original Report Authenticated By: D. Andria Rhein, MD     ASSESSMENT / PLAN:  57 yo smoker admitted 3-23 with abd pain, N/V. Ct abd with Ct findings suspicious for metastatic disease, asdz lul ?post obstructive pna,rt paratracheal lymph node.  Plan: T2N3 possibly M1  -likely stg 4  Sites to biopsy would include endobronchial [place] with needle aspiration of rt paratracheal Ln 9will have to schedule in OR)  vs US biopsy of liver lesion -appears superficial, will discuss with IR   Hyponatremia - resolved, may be related to malignancy  Acute renal failure- dehydration , resolved  Ileus -  obtain CT abdomen/ pelvis with PO & IV contrast  Liver lesions- favor mets   ETOH abuse - w/f withdrawal  Care during the described time interval was provided by me and/or other providers on the critical care team.  I have reviewed this patient's available data, including medical history, events of note, physical examination and test results as part of my evaluation  Cyril Mourning MD. FCCP. [facility] Pager [phone] If no response call [phone]     03/07/2013, 9:13 AM

## 2013-03-07 NOTE — Evaluation (Signed)
Physical Therapy Evaluation Patient Details Name: Rodney Powers MRN: [id number] DOB: 1956/03/14 Today's Date: 03/07/2013 Time: 4098-1191 PT Time Calculation (min): 18 min  PT Assessment / Plan / Recommendation Clinical Impression  Pt is a 57 y.o. male who presented for nausea and vomitting in the setting of PNA.  Patient demonstrates no signficant deficits in functional mobility at this time. Pt has no acute PT needs, will sign off.    PT Assessment  Patent does not need any further PT services    Follow Up Recommendations  No PT follow up          Equipment Recommendations  None recommended by PT          Precautions / Restrictions Precautions Precautions: None Restrictions Weight Bearing Restrictions: No   Pertinent Vitals/Pain No pain reported at this time      Mobility  Bed Mobility Bed Mobility: Supine to Sit;Sitting - Scoot to Edge of Bed Supine to Sit: 7: Independent Sitting - Scoot to [place] of Bed: 7: Independent Transfers Transfers: Sit to Stand;Stand to Sit Sit to Stand: 7: Independent Stand to Sit: 7: Independent Ambulation/Gait Ambulation/Gait Assistance: 7: Independent Ambulation Distance (Feet): 300 Feet Assistive device: None Ambulation/Gait Assistance Details: no assist required; pt feels he is at his baseline Gait Pattern: Within Functional Limits Gait velocity: [facility] Gait Details: pt demonstrates no significant gait dysfunction at this time      Visit Information  Last PT Received On: 03/07/13 Assistance Needed: +1    Subjective Data  Subjective: I had a bad gag refelx Patient Stated Goal: to go home   Prior Functioning  Home Living Lives With: Significant other;Family Available Help at Discharge: Available PRN/intermittently Type of Home: House Home Access: Stairs to enter [employer] of Steps: 4 Entrance Stairs-Rails: Can reach both Home Layout: One level Bathroom Shower/Tub: Agricultural engineer: Standard Bathroom Accessibility: Yes How Accessible: Accessible via walker Home Adaptive Equipment: None Prior Function Level of Independence: Independent Able to Take Stairs?: Yes Driving: Yes Vocation: Unemployed    Cognition  Cognition Overall Cognitive Status: Appears within functional limits for tasks assessed/performed Arousal/Alertness: Awake/alert Orientation Level: Appears intact for tasks assessed;Oriented X4 / Intact Behavior During Session: [facility] for tasks performed    Extremity/Trunk Assessment Right Upper Extremity Assessment RUE ROM/Strength/Tone: [facility] for tasks assessed Left Upper Extremity Assessment LUE ROM/Strength/Tone: [facility] for tasks assessed Right Lower Extremity Assessment RLE ROM/Strength/Tone: [facility] for tasks assessed Left Lower Extremity Assessment LLE ROM/Strength/Tone: [facility] for tasks assessed Trunk Assessment Trunk Assessment: Normal   Balance Balance Balance Assessed: Yes High Level Balance High Level Balance Activites: Side stepping;Backward walking;Direction changes;Turns;Head turns High Level Balance Comments: steady with balance activites  End of Session PT - End of Session Equipment Utilized During Treatment: Gait belt Activity Tolerance: Patient tolerated treatment well Patient left: in chair;with call bell/phone within reach Nurse Communication: Mobility status  GP     Fabio Asa 03/07/2013, 11:16 AM Charlotte Crumb, PT DPT  [phone]

## 2013-03-07 NOTE — Progress Notes (Signed)
Patient ID: Rodney Powers  male  ZOX:[id number]    DOB: [birth year]    DOA: 03/06/2013  PCP: Default, Provider, MD  Assessment/Plan:   PNA likely postobstructive/ 5x6cm mass right upper lung/Leukocytosis/ metastasis:  - CT findings suspicious for metastatic disease, post obstructive PNA Rt paratracheal lymph node - Pulmonology and interventional radiology consulted - US guided biopsy today by IR, keep NPO  Hyponatremia: improved, likely sec to decreased intravascular volume but also has pulm process which can cause SIADH  AKI (acute kidney injury): resolved, likely secondary to nausea and vomiting - cont IV fluids   Protein-calorie malnutrition, moderate: albumin is 2.6 possibly sec to malignancy, Etoh  - nutrition consult  Alcohol abuse: last drink was about 4 days ago.  - cont CIWA protocol  Hyperglycemia: most likely secondary to his infectious process, on solumedrol with underlying DM.  -  on low dose Levemir plus sliding scale insulin.  -  hemoglobin A1c 7.6.   DVT Prophylaxis: Heparin subcutaneous held for biopsy today  Code Status:  Disposition:    Subjective: Requesting for food, no chest pain, shortness of breath fevers or chills  Objective: Weight change:   Intake/Output Summary (Last 24 hours) at 03/07/13 1359 Last data filed at 03/07/13 0500  Gross per 24 hour  Intake      0 ml  Output   1600 ml  Net  -1600 ml   Blood pressure 122/82, pulse 66, temperature 96.5 F (35.8 C), temperature source Oral, resp. rate 22, height 6\' 3"  (1.905 m), weight 99.791 kg (220 lb), SpO2 97.00%.  Physical Exam: General: Alert and awake, oriented x3, not in any acute distress. CVS: S1-S2 clear, no murmur rubs or gallops Chest: clear to auscultation bilaterally, wheezing improved  Abdomen: soft nontender, nondistended, normal bowel sounds Extremities: no cyanosis, clubbing or edema noted bilaterally   Lab Results: Basic Metabolic Panel:  Recent Labs Lab  03/07/13 0511 03/07/13 0910  NA 136 135  K 4.5 4.3  CL 103 103  CO2 23 21  GLUCOSE 180* 159*  BUN 10 11  CREATININE 0.93 0.81  CALCIUM 8.9 8.9   Liver Function Tests:  Recent Labs Lab 03/06/13 1009  AST 46*  ALT 51  ALKPHOS 99  BILITOT 0.6  PROT 8.1  ALBUMIN 2.6*    Recent Labs Lab 03/06/13 1009  LIPASE 32   No results found for this basename: AMMONIA,  in the last 168 hours CBC:  Recent Labs Lab 03/06/13 1009 03/06/13 1759 03/07/13 0511  WBC 15.3* 13.7* 13.0*  NEUTROABS 12.8*  --   --   HGB 11.9* 10.1* 11.1*  HCT 34.7* 29.9* 33.5*  MCV 75.3* 73.5* 75.3*  PLT 476* 387 442*   Cardiac Enzymes: No results found for this basename: CKTOTAL, CKMB, CKMBINDEX, TROPONINI,  in the last 168 hours BNP: No components found with this basename: POCBNP,  CBG:  Recent Labs Lab 03/06/13 1806 03/06/13 2201 03/07/13 0801 03/07/13 1204  GLUCAP 119* 171* 173* 163*     Micro Results: Recent Results (from the past 240 hour(s))  CULTURE, BLOOD (ROUTINE X 2)     Status: None   Collection Time    03/06/13  4:42 PM      Result Value Range Status   Specimen Description BLOOD RIGHT ARM   Final   Special Requests BOTTLES DRAWN AEROBIC AND ANAEROBIC [facility]   Final   Culture  Setup Time 03/06/2013 21:15   Final   Culture     Final  Value:        BLOOD CULTURE RECEIVED NO GROWTH TO DATE CULTURE WILL BE HELD FOR 5 DAYS BEFORE ISSUING A FINAL NEGATIVE REPORT   Report Status PENDING   Incomplete  CULTURE, BLOOD (ROUTINE X 2)     Status: None   Collection Time    03/06/13  4:49 PM      Result Value Range Status   Specimen Description BLOOD LEFT HAND   Final   Special Requests BOTTLES DRAWN AEROBIC AND ANAEROBIC 10CC   Final   Culture  Setup Time 03/06/2013 21:15   Final   Culture     Final   Value:        BLOOD CULTURE RECEIVED NO GROWTH TO DATE CULTURE WILL BE HELD FOR 5 DAYS BEFORE ISSUING A FINAL NEGATIVE REPORT   Report Status PENDING   Incomplete     Studies/Results: Ct Chest W Contrast  03/06/2013  *RADIOLOGY REPORT*  Clinical Data: Mass noted on x-ray  CT CHEST WITH CONTRAST  Technique:  Multidetector CT imaging of the chest was performed following the standard protocol during bolus administration of intravenous contrast.  Contrast: 80mL OMNIPAQUE IOHEXOL 300 MG/ML  SOLN  Comparison: Chest x-ray same day  Findings: Sagittal images of the spine are unremarkable.  The images of the thoracic inlet are unremarkable.  Central airways are patent.  As noted on chest x-rays there is a nodular mass or confluent adenopathy in the left mediastinum extending in the left upper lobe suprahilar region.  There is low density centrally suspicious for necrosis the lesion measures at least 6.5 x 5.5 cm highly suspicious for malignancy.  Correlation with bronchoscopy and/or biopsy is recommended.  A AP window lymph node measures 1.4 x 1.5 cm.  Right pretracheal lymph node measures 1.6 x 1.3 cm.  Left hilar lymph node measures 2 x 1.9 cm.  No right hilar adenopathy is noted.  Images of the lung parenchyma shows bilateral interstitial prominence.  There is interstitial thickening with patchy airspace disease in the left upper lobe anteriorly suspicious for postobstructive pneumonia.  Large paraseptal emphysematous bullae are noted in the upper lobes bilaterally.  Small hiatal hernia.  There is a low density lesion in the right hepatic lobe laterally measures 1.7 cm.  Second low density lesion in the right hepatic lobe measures 2.2 cm.  These are highly suspicious for metastatic disease.  Further evaluation is recommended.  No adrenal gland mass is noted.  Bilateral nonspecific axillary lymph nodes are noted.  IMPRESSION:  1.  There is a mass or confluent adenopathy in the left mediastinum//suprahilar region measures 6.5 x 5.5 cm.  This is highly suspicious for malignancy.  Further evaluation with bronchoscopy and/or biopsy is recommended. 2.  There are pathologic  mediastinal and left hilar lymph nodes. 3.  Airspace disease in the left upper lobe anteriorly suspicious for postobstructive pneumonia. 4.  Extensive  emphysematous changes bilateral upper lobe. 5.  Low density lesions are noted in the right hepatic lobe suspicious for metastatic disease.  Further evaluation is recommended.  Critical findings discussed with Dr. Ethelda Chick   Original Report Authenticated By: Natasha Mead, M.D.    Dg Abd Acute W/chest  03/06/2013  *RADIOLOGY REPORT*  Clinical Data: Abdominal pain, emesis.  ACUTE ABDOMEN SERIES (ABDOMEN 2 VIEW & CHEST 1 VIEW)  Comparison: 10/13/2011  Findings: Poorly marginated left hilar mass-like opacity.  Streaky left perihilar interstitial and alveolar opacities.  Stable prominent right perihilar interstitial markings.  No effusion. Heart size normal.  No free air. Small bowel and colon are nondilated.  Scattered fluid levels on the erect radiograph.  IMPRESSION:  1.  Left perihilar mass-like opacity with adjacent peripheral disease suggesting obstructing mass or pneumonia.  CT chest with contrast may be useful for further characterization. 2.  Nonobstructive bowel gas pattern with scattered fluid levels, suggesting possible early ileus.   Original Report Authenticated By: D. Andria Rhein, MD     Medications: Scheduled Meds: . azithromycin  500 mg Oral Q24H  . cefTRIAXone (ROCEPHIN)  IV  1 g Intravenous Q24H  . fentaNYL      . folic acid  1 mg Oral Daily  . heparin  5,000 Units Subcutaneous Q8H  . insulin aspart  0-15 Units Subcutaneous TID WC  . insulin aspart  0-5 Units Subcutaneous QHS  . insulin aspart  4 Units Subcutaneous TID WC  . insulin detemir  5 Units Subcutaneous QHS  . LORazepam  0-4 mg Intravenous Q6H   Followed by  . [START ON 03/08/2013] LORazepam  0-4 mg Intravenous Q12H  . methylPREDNISolone (SOLU-MEDROL) injection  60 mg Intravenous Q12H  . midazolam      . multivitamin with minerals  1 tablet Oral Daily  . polyethylene glycol   17 g Oral Daily  . thiamine  100 mg Oral Daily   Or  . thiamine  100 mg Intravenous Daily      LOS: 1 day   Abdulahi Schor M.D. [facility] 03/07/2013, 1:59 PM Pager: [phone]  If 7PM-7AM, please contact night-coverage [email or web address] Password [email or web address]

## 2013-03-07 NOTE — Progress Notes (Signed)
INITIAL NUTRITION ASSESSMENT  DOCUMENTATION CODES Per approved criteria  -Not Applicable   INTERVENTION: 1.  General healthful diet; discussed intake and nutrition-related goals with pt.  Pt reports poor tolerance of meals-particularly solid food.  2.  Supplements; Ensure Complete daily  NUTRITION DIAGNOSIS: Unintended wt change related to nausea/vomiting as evidenced by pt report of 15 lbs wt loss.    Monitor:  1.  Food/Beverage; intake sufficient to meet >/=90% estimated needs 2.  Wt/wt change; monitor trends  Reason for Assessment: consult  57 y.o. male  Admitting Dx: post-obstructive PNA  ASSESSMENT: Pt admitted with cough, found to have post-obstructive PNA with possible lung mass.  Pt reports he has been vomiting with meals x2 weeks.  He reports 15 lbs wt loss in unspecified time frame; usual wt is 235 lbs. Pt is eating at time of visit- his first meal since admission per his report and states he feels "burning" in his stomach.  Despite mild discomfort, pt ask to ensure he receives dinner tonight. RD to order supplement once daily for pt.  Additional supplements to be considered based on intake.   Height: Ht Readings from Last 1 Encounters:  03/06/13 6\' 3"  (1.905 m)    Weight: Wt Readings from Last 1 Encounters:  03/06/13 220 lb (99.791 kg)    Ideal Body Weight: 86.3 kg  % Ideal Body Weight: 115%  Wt Readings from Last 10 Encounters:  03/06/13 220 lb (99.791 kg)  10/13/11 235 lb (106.595 kg)    Usual Body Weight: 235 lbs  % Usual Body Weight: 93%  BMI:  Body mass index is 27.5 kg/(m^2).  Estimated Nutritional Needs: Kcal: 2300-2500 Protein: 80-100g Fluid: >2.3 L/day  Skin: intact  Diet Order: Carb Control  EDUCATION NEEDS: -Education needs addressed   Intake/Output Summary (Last 24 hours) at 03/07/13 1528 Last data filed at 03/07/13 1459  Gross per 24 hour  Intake    800 ml  Output   1600 ml  Net   -800 ml    Last BM:  3/24  Labs:   Recent Labs Lab 03/06/13 1009 03/06/13 1759 03/07/13 0511 03/07/13 0910  NA 131*  --  136 135  K 4.0  --  4.5 4.3  CL 93*  --  103 103  CO2 24  --  23 21  BUN 10  --  10 11  CREATININE 1.14 1.03 0.93 0.81  CALCIUM 9.1  --  8.9 8.9  GLUCOSE 250*  --  180* 159*    CBG (last 3)   Recent Labs  03/06/13 2201 03/07/13 0801 03/07/13 1204  GLUCAP 171* 173* 163*    Scheduled Meds: . azithromycin  500 mg Oral Q24H  . cefTRIAXone (ROCEPHIN)  IV  1 g Intravenous Q24H  . folic acid  1 mg Oral Daily  . heparin  5,000 Units Subcutaneous Q8H  . insulin aspart  0-15 Units Subcutaneous TID WC  . insulin aspart  0-5 Units Subcutaneous QHS  . insulin aspart  4 Units Subcutaneous TID WC  . insulin detemir  5 Units Subcutaneous QHS  . LORazepam  0-4 mg Intravenous Q6H   Followed by  . [START ON 03/08/2013] LORazepam  0-4 mg Intravenous Q12H  . methylPREDNISolone (SOLU-MEDROL) injection  60 mg Intravenous Q12H  . multivitamin with minerals  1 tablet Oral Daily  . polyethylene glycol  17 g Oral Daily  . thiamine  100 mg Oral Daily   Or  . thiamine  100 mg Intravenous Daily  Continuous Infusions: . sodium chloride      Past Medical History  Diagnosis Date  . Back pain, chronic   . COPD (chronic obstructive pulmonary disease)     History reviewed. No pertinent past surgical history.  Loyce Dys, MS RD LDN Clinical Inpatient Dietitian Pager: [phone] Weekend/After hours pager: [phone]

## 2013-03-07 NOTE — Progress Notes (Signed)
Occupational Therapy Evaluation Patient Details Name: Rodney Powers MRN: [id number] DOB: 24-Oct-1956 Today's Date: 03/07/2013 Time: 6578-4696 OT Time Calculation (min): 17 min  OT Assessment / Plan / Recommendation Clinical Impression  57 year old male with a history of Alcoholism, past medical history of tobacco abuse. Pt dx with Nausea and vomiting due to to PNA (pneumonia)/Mass of lung/Leukocytosis. Pt reports difficulty swallowing due to nausea and vomiting. Pt with likely a postobstructive pna due to a 5/6cm mass in the right upper lung. Pt independent with ADL and mobility for ADL. No further OT indicated at this time. 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       OT Assessment  Patient does not need any further OT services    Follow Up Recommendations  No OT follow up    Barriers to Discharge      Equipment Recommendations  None recommended by OT    Recommendations for Other Services  SW to educate about community resources.  Frequency       Precautions / Restrictions Precautions Precautions: None   Pertinent Vitals/Pain No pain    ADL  Grooming: Independent Where Assessed - Grooming: Unsupported standing Upper Body Bathing: Independent Where Assessed - Upper Body Bathing: Unsupported standing Lower Body Bathing: Modified independent Where Assessed - Lower Body Bathing: Unsupported sit to stand Upper Body Dressing: Independent Where Assessed - Upper Body Dressing: Unsupported sitting Lower Body Dressing: Modified independent Where Assessed - Lower Body Dressing: Unsupported sit to stand Toilet Transfer: Independent Toilet Transfer Method: Other (comment) (amulating) Acupuncturist: Comfort height toilet Toileting - Architect and Hygiene: Independent Where Assessed - Engineer, mining and Hygiene: Standing Transfers/Ambulation Related to ADLs:  indpendent    OT Diagnosis:    OT Problem List:   OT Treatment Interventions:     OT Goals Acute Rehab OT Goals OT Goal Formulation:  (eval only)  Visit Information  Last OT Received On: 03/07/13 Assistance Needed: +1    Subjective Data      Prior Functioning     Home Living Lives With: Significant other;Family Available Help at Discharge: Available PRN/intermittently Type of Home: House Home Access: Stairs to enter Secretary/administrator of Steps: 4 Entrance Stairs-Rails: Can reach both Home Layout: One level Bathroom Shower/Tub: Engineer, manufacturing systems: Standard Bathroom Accessibility: Yes How Accessible: Accessible via walker Home Adaptive Equipment: None Prior Function Level of Independence: Independent Able to Take Stairs?: Yes Driving: Yes Vocation: Unemployed         Vision/Perception Vision - History Baseline Vision: No visual deficits   Cognition  Cognition Overall Cognitive Status: Appears within functional limits for tasks assessed/performed Arousal/Alertness: Awake/alert Orientation Level: Appears intact for tasks assessed;Oriented X4 / Intact Behavior During Session: [facility] for tasks performed    Extremity/Trunk Assessment Right Upper Extremity Assessment RUE ROM/Strength/Tone: [facility] for tasks assessed Left Upper Extremity Assessment LUE ROM/Strength/Tone: WFL for tasks assessed Right Lower Extremity Assessment RLE ROM/Strength/Tone: [facility] for tasks assessed Left Lower Extremity Assessment LLE ROM/Strength/Tone: [facility] for tasks assessed Trunk Assessment Trunk Assessment: Normal     Mobility Bed Mobility Bed Mobility: Supine to Sit;Sitting - Scoot to Edge of Bed Supine to Sit: 7: Independent Sitting - Scoot to [place] of Bed: 7: Independent Transfers Transfers: Sit to Stand;Stand to Sit Sit to Stand: 7: Independent Stand to Sit: 7: Independent     Exercise     Balance Balance Balance Assessed:  (WFL for ADL) High  Level  Balance High Level Balance Activites: Side stepping;Backward walking;Direction changes;Turns;Head turns High Level Balance Comments: steady with balance activites   End of Session OT - End of Session Equipment Utilized During Treatment: Gait belt Activity Tolerance: Patient tolerated treatment well Patient left: in chair;with call bell/phone within reach Nurse Communication: Mobility status  GO     Jeptha Hinnenkamp,HILLARY 03/07/2013, 11:15 AM Luisa Dago, OTR/L  [phone] 03/07/2013

## 2013-03-07 NOTE — H&P (Signed)
Rodney Powers is an 57 y.o. male.   Chief Complaint: abd pain; cough; fever; chills; sweats Work up reveals liver mass; lymphadenopathy; sacral lesion Scheduled for sacral lesion biopsy  HPI: COPD; ETOH abuse  Past Medical History  Diagnosis Date  . Back pain, chronic   . COPD (chronic obstructive pulmonary disease)     History reviewed. No pertinent past surgical history.  Family History  Problem Relation Age of Onset  . Aneurysm Mother   . Heart attack Father    Social History:  reports that he has been smoking Cigarettes.  He has been smoking about 1.00 pack per day. He does not have any smokeless tobacco history on file. He reports that he drinks about 3.6 ounces of alcohol per week. He reports that he uses illicit drugs (Marijuana).  Allergies: No Known Allergies  No prescriptions prior to admission    Results for orders placed during the hospital encounter of 03/06/13 (from the past 48 hour(s))  CBC WITH DIFFERENTIAL     Status: Abnormal   Collection Time    03/06/13 10:09 AM      Result Value Range   WBC 15.3 (*) 4.0 - 10.5 K/uL   RBC 4.61  4.22 - 5.81 MIL/uL   Hemoglobin 11.9 (*) 13.0 - 17.0 g/dL   HCT 16.1 (*) 09.6 - 04.5 %   MCV 75.3 (*) 78.0 - 100.0 fL   MCH 25.8 (*) 26.0 - 34.0 pg   MCHC 34.3  30.0 - 36.0 g/dL   RDW 40.9  81.1 - 91.4 %   Platelets 476 (*) 150 - 400 K/uL   Neutrophils Relative 83 (*) 43 - 77 %   Neutro Abs 12.8 (*) 1.7 - 7.7 K/uL   Lymphocytes Relative 9 (*) 12 - 46 %   Lymphs Abs 1.3  0.7 - 4.0 K/uL   Monocytes Relative 8  3 - 12 %   Monocytes Absolute 1.2 (*) 0.1 - 1.0 K/uL   Eosinophils Relative 0  0 - 5 %   Eosinophils Absolute 0.0  0.0 - 0.7 K/uL   Basophils Relative 0  0 - 1 %   Basophils Absolute 0.0  0.0 - 0.1 K/uL  COMPREHENSIVE METABOLIC PANEL     Status: Abnormal   Collection Time    03/06/13 10:09 AM      Result Value Range   Sodium 131 (*) 135 - 145 mEq/L   Potassium 4.0  3.5 - 5.1 mEq/L   Chloride 93 (*) 96 - 112  mEq/L   CO2 24  19 - 32 mEq/L   Glucose, Bld 250 (*) 70 - 99 mg/dL   BUN 10  6 - 23 mg/dL   Creatinine, Ser 7.82  0.50 - 1.35 mg/dL   Calcium 9.1  8.4 - 95.6 mg/dL   Total Protein 8.1  6.0 - 8.3 g/dL   Albumin 2.6 (*) 3.5 - 5.2 g/dL   AST 46 (*) 0 - 37 U/L   ALT 51  0 - 53 U/L   Alkaline Phosphatase 99  39 - 117 U/L   Total Bilirubin 0.6  0.3 - 1.2 mg/dL   GFR calc non Af Amer 71 (*) >90 mL/min   GFR calc Af Amer 82 (*) >90 mL/min   Comment:            The eGFR has been calculated     using the CKD EPI equation.     This calculation has not been     validated  in all clinical     situations.     eGFR's persistently     <90 mL/min signify     possible Chronic Kidney Disease.  LIPASE, BLOOD     Status: None   Collection Time    03/06/13 10:09 AM      Result Value Range   Lipase 32  11 - 59 U/L  URINALYSIS, MICROSCOPIC ONLY     Status: Abnormal   Collection Time    03/06/13 10:26 AM      Result Value Range   Color, Urine ORANGE (*) YELLOW   Comment: BIOCHEMICALS MAY BE AFFECTED BY COLOR   APPearance CLEAR  CLEAR   Specific Gravity, Urine 1.024  1.005 - 1.030   pH 5.5  5.0 - 8.0   Glucose, UA NEGATIVE  NEGATIVE mg/dL   Hgb urine dipstick MODERATE (*) NEGATIVE   Bilirubin Urine SMALL (*) NEGATIVE   Ketones, ur 15 (*) NEGATIVE mg/dL   Protein, ur 30 (*) NEGATIVE mg/dL   Urobilinogen, UA 2.0 (*) 0.0 - 1.0 mg/dL   Nitrite NEGATIVE  NEGATIVE   Leukocytes, UA TRACE (*) NEGATIVE   WBC, UA 0-2  <3 WBC/hpf   RBC / HPF 0-2  <3 RBC/hpf   Squamous Epithelial / LPF RARE  RARE  CULTURE, BLOOD (ROUTINE X 2)     Status: None   Collection Time    03/06/13  4:42 PM      Result Value Range   Specimen Description BLOOD RIGHT ARM     Special Requests BOTTLES DRAWN AEROBIC AND ANAEROBIC 7CC     Culture  Setup Time 03/06/2013 21:15     Culture       Value:        BLOOD CULTURE RECEIVED NO GROWTH TO DATE CULTURE WILL BE HELD FOR 5 DAYS BEFORE ISSUING A FINAL NEGATIVE REPORT   Report  Status PENDING    CULTURE, BLOOD (ROUTINE X 2)     Status: None   Collection Time    03/06/13  4:49 PM      Result Value Range   Specimen Description BLOOD LEFT HAND     Special Requests BOTTLES DRAWN AEROBIC AND ANAEROBIC 10CC     Culture  Setup Time 03/06/2013 21:15     Culture       Value:        BLOOD CULTURE RECEIVED NO GROWTH TO DATE CULTURE WILL BE HELD FOR 5 DAYS BEFORE ISSUING A FINAL NEGATIVE REPORT   Report Status PENDING    CG4 I-STAT (LACTIC ACID)     Status: None   Collection Time    03/06/13  5:12 PM      Result Value Range   Lactic Acid, Venous 1.06  0.5 - 2.2 mmol/L  HIV ANTIBODY (ROUTINE TESTING)     Status: None   Collection Time    03/06/13  5:59 PM      Result Value Range   HIV NON REACTIVE  NON REACTIVE  CBC     Status: Abnormal   Collection Time    03/06/13  5:59 PM      Result Value Range   WBC 13.7 (*) 4.0 - 10.5 K/uL   RBC 4.07 (*) 4.22 - 5.81 MIL/uL   Hemoglobin 10.1 (*) 13.0 - 17.0 g/dL   HCT 40.3 (*) 47.4 - 25.9 %   MCV 73.5 (*) 78.0 - 100.0 fL   MCH 24.8 (*) 26.0 - 34.0 pg   MCHC 33.8  30.0 -  36.0 g/dL   RDW 16.1  09.6 - 04.5 %   Platelets 387  150 - 400 K/uL  CREATININE, SERUM     Status: Abnormal   Collection Time    03/06/13  5:59 PM      Result Value Range   Creatinine, Ser 1.03  0.50 - 1.35 mg/dL   GFR calc non Af Amer 80 (*) >90 mL/min   GFR calc Af Amer >90  >90 mL/min   Comment:            The eGFR has been calculated     using the CKD EPI equation.     This calculation has not been     validated in all clinical     situations.     eGFR's persistently     <90 mL/min signify     possible Chronic Kidney Disease.  HEMOGLOBIN A1C     Status: Abnormal   Collection Time    03/06/13  5:59 PM      Result Value Range   Hemoglobin A1C 7.6 (*) <5.7 %   Comment: (NOTE)                                                                               According to the ADA Clinical Practice Recommendations for 2011, when     HbA1c is used  as a screening test:      >=6.5%   Diagnostic of Diabetes Mellitus               (if abnormal result is confirmed)     5.7-6.4%   Increased risk of developing Diabetes Mellitus     References:Diagnosis and Classification of Diabetes Mellitus,Diabetes     Care,2011,34(Suppl 1):S62-S69 and Standards of Medical Care in             Diabetes - 2011,Diabetes Care,2011,34 (Suppl 1):S11-S61.   Mean Plasma Glucose 171 (*) <117 mg/dL  GLUCOSE, CAPILLARY     Status: Abnormal   Collection Time    03/06/13  6:06 PM      Result Value Range   Glucose-Capillary 119 (*) 70 - 99 mg/dL   Comment 1 Notify RN    SODIUM, URINE, RANDOM     Status: None   Collection Time    03/06/13  8:16 PM      Result Value Range   Sodium, Ur 106    CREATININE, URINE, RANDOM     Status: None   Collection Time    03/06/13  8:16 PM      Result Value Range   Creatinine, Urine 93.49    STREP PNEUMONIAE URINARY ANTIGEN     Status: None   Collection Time    03/06/13  8:17 PM      Result Value Range   Strep Pneumo Urinary Antigen NEGATIVE  NEGATIVE   Comment:            Infection due to S. pneumoniae     cannot be absolutely ruled out     since the antigen present     may be below the detection limit     of the test.  GLUCOSE, CAPILLARY     Status:  Abnormal   Collection Time    03/06/13 10:01 PM      Result Value Range   Glucose-Capillary 171 (*) 70 - 99 mg/dL   Comment 1 Documented in Chart     Comment 2 Notify RN    CBC     Status: Abnormal   Collection Time    03/07/13  5:11 AM      Result Value Range   WBC 13.0 (*) 4.0 - 10.5 K/uL   RBC 4.45  4.22 - 5.81 MIL/uL   Hemoglobin 11.1 (*) 13.0 - 17.0 g/dL   HCT 16.1 (*) 09.6 - 04.5 %   MCV 75.3 (*) 78.0 - 100.0 fL   MCH 24.9 (*) 26.0 - 34.0 pg   MCHC 33.1  30.0 - 36.0 g/dL   RDW 40.9  81.1 - 91.4 %   Platelets 442 (*) 150 - 400 K/uL  BASIC METABOLIC PANEL     Status: Abnormal   Collection Time    03/07/13  5:11 AM      Result Value Range   Sodium 136  135  - 145 mEq/L   Potassium 4.5  3.5 - 5.1 mEq/L   Chloride 103  96 - 112 mEq/L   CO2 23  19 - 32 mEq/L   Glucose, Bld 180 (*) 70 - 99 mg/dL   BUN 10  6 - 23 mg/dL   Creatinine, Ser 7.82  0.50 - 1.35 mg/dL   Calcium 8.9  8.4 - 95.6 mg/dL   GFR calc non Af Amer >90  >90 mL/min   GFR calc Af Amer >90  >90 mL/min   Comment:            The eGFR has been calculated     using the CKD EPI equation.     This calculation has not been     validated in all clinical     situations.     eGFR's persistently     <90 mL/min signify     possible Chronic Kidney Disease.  GLUCOSE, CAPILLARY     Status: Abnormal   Collection Time    03/07/13  8:01 AM      Result Value Range   Glucose-Capillary 173 (*) 70 - 99 mg/dL  BASIC METABOLIC PANEL     Status: Abnormal   Collection Time    03/07/13  9:10 AM      Result Value Range   Sodium 135  135 - 145 mEq/L   Potassium 4.3  3.5 - 5.1 mEq/L   Chloride 103  96 - 112 mEq/L   CO2 21  19 - 32 mEq/L   Glucose, Bld 159 (*) 70 - 99 mg/dL   BUN 11  6 - 23 mg/dL   Creatinine, Ser 2.13  0.50 - 1.35 mg/dL   Calcium 8.9  8.4 - 08.6 mg/dL   GFR calc non Af Amer >90  >90 mL/min   GFR calc Af Amer >90  >90 mL/min   Comment:            The eGFR has been calculated     using the CKD EPI equation.     This calculation has not been     validated in all clinical     situations.     eGFR's persistently     <90 mL/min signify     possible Chronic Kidney Disease.   Ct Chest W Contrast  03/06/2013  *RADIOLOGY REPORT*  Clinical Data: Mass noted on x-ray  CT CHEST WITH CONTRAST  Technique:  Multidetector CT imaging of the chest was performed following the standard protocol during bolus administration of intravenous contrast.  Contrast: 80mL OMNIPAQUE IOHEXOL 300 MG/ML  SOLN  Comparison: Chest x-ray same day  Findings: Sagittal images of the spine are unremarkable.  The images of the thoracic inlet are unremarkable.  Central airways are patent.  As noted on chest x-rays there  is a nodular mass or confluent adenopathy in the left mediastinum extending in the left upper lobe suprahilar region.  There is low density centrally suspicious for necrosis the lesion measures at least 6.5 x 5.5 cm highly suspicious for malignancy.  Correlation with bronchoscopy and/or biopsy is recommended.  A AP window lymph node measures 1.4 x 1.5 cm.  Right pretracheal lymph node measures 1.6 x 1.3 cm.  Left hilar lymph node measures 2 x 1.9 cm.  No right hilar adenopathy is noted.  Images of the lung parenchyma shows bilateral interstitial prominence.  There is interstitial thickening with patchy airspace disease in the left upper lobe anteriorly suspicious for postobstructive pneumonia.  Large paraseptal emphysematous bullae are noted in the upper lobes bilaterally.  Small hiatal hernia.  There is a low density lesion in the right hepatic lobe laterally measures 1.7 cm.  Second low density lesion in the right hepatic lobe measures 2.2 cm.  These are highly suspicious for metastatic disease.  Further evaluation is recommended.  No adrenal gland mass is noted.  Bilateral nonspecific axillary lymph nodes are noted.  IMPRESSION:  1.  There is a mass or confluent adenopathy in the left mediastinum//suprahilar region measures 6.5 x 5.5 cm.  This is highly suspicious for malignancy.  Further evaluation with bronchoscopy and/or biopsy is recommended. 2.  There are pathologic mediastinal and left hilar lymph nodes. 3.  Airspace disease in the left upper lobe anteriorly suspicious for postobstructive pneumonia. 4.  Extensive  emphysematous changes bilateral upper lobe. 5.  Low density lesions are noted in the right hepatic lobe suspicious for metastatic disease.  Further evaluation is recommended.  Critical findings discussed with Dr. Ethelda Chick   Original Report Authenticated By: Natasha Mead, M.D.    Dg Abd Acute W/chest  03/06/2013  *RADIOLOGY REPORT*  Clinical Data: Abdominal pain, emesis.  ACUTE ABDOMEN SERIES  (ABDOMEN 2 VIEW & CHEST 1 VIEW)  Comparison: 10/13/2011  Findings: Poorly marginated left hilar mass-like opacity.  Streaky left perihilar interstitial and alveolar opacities.  Stable prominent right perihilar interstitial markings.  No effusion. Heart size normal.  No free air. Small bowel and colon are nondilated.  Scattered fluid levels on the erect radiograph.  IMPRESSION:  1.  Left perihilar mass-like opacity with adjacent peripheral disease suggesting obstructing mass or pneumonia.  CT chest with contrast may be useful for further characterization. 2.  Nonobstructive bowel gas pattern with scattered fluid levels, suggesting possible early ileus.   Original Report Authenticated By: D. Andria Rhein, MD     Review of Systems  Constitutional: Positive for weight loss. Negative for fever.  Respiratory: Positive for cough.   Cardiovascular: Negative for chest pain.  Gastrointestinal: Positive for abdominal pain.  Neurological: Positive for headaches.  Psychiatric/Behavioral: The patient is not nervous/anxious.     Blood pressure 122/82, pulse 66, temperature 96.5 F (35.8 C), temperature source Oral, resp. rate 22, height 6\' 3"  (1.905 m), weight 220 lb (99.791 kg), SpO2 97.00%. Physical Exam  Constitutional: He is oriented to person, place, and time. He appears well-developed.  Cardiovascular: Normal rate, regular  rhythm and normal heart sounds.   No murmur heard. Respiratory: Effort normal. He has wheezes.  GI: Soft. Bowel sounds are normal.  Musculoskeletal: Normal range of motion.  Neurological: He is alert and oriented to person, place, and time.  Psychiatric: He has a normal mood and affect. His behavior is normal. Judgment and thought content normal.     Assessment/Plan Liver lesion; LAN; sacral lesion Scheduled for sacral lesion biopsy Pt aware of procedure benefits and risks and agreeable to propceed Consent signed and in chart Hep inj held after 6 am dose ;may resume in am Dr  Deanne Coffer reviewed and discussed with Dr Sheffield Slider A 03/07/2013, 11:49 AM

## 2013-03-07 NOTE — ED Notes (Signed)
CT procedure cancelled

## 2013-03-07 NOTE — Progress Notes (Addendum)
  Clinical/Bedside Swallow Evaluation Patient Details  Name: JEURY MCNAB MRN: [id number] Date of Birth: [birth year]  Today's Date: 03/07/2013 Time: 1308-6578 SLP Time Calculation (min): 20 min  Past Medical History:  Past Medical History  Diagnosis Date  . Back pain, chronic   . COPD (chronic obstructive pulmonary disease)    Past Surgical History: History reviewed. No pertinent past surgical history. HPI:  Pt isa 57 year old male with a history of Alcoholism, his last drink was 4 days prior to admission, past medical history of tobacco abuse, but no primary care doctor. Pt dx with Nausea and vomiting due to to PNA (pneumonia)/Mass of lung/Leukocytosis. Pt reports difficulty swallowing due to nausea and vomiting. Pt with likely a postobstructive pna due to a 5/6cm mass in the right upper lung.   Assessment / Plan / Recommendation Clinical Impression  Pt demonstrates normal oral and oropharyngeal function. No evidence of aspiration. Pt is safe to initiate a regular diet and thin liquids but Dr. Vassie Loll says that pt will need to stay NPO at this time for CT of abdomen. Confirms that the small amount of PO pt consumed for swallow eval will not impact test. Pt aware. No SLP f/u needed at this time.     Aspiration Risk  None    Diet Recommendation Regular;Thin liquid   Liquid Administration via: Cup;Straw Medication Administration: Whole meds with liquid Supervision: Patient able to self feed    Other  Recommendations Oral Care Recommendations: Patient independent with oral care   Follow Up Recommendations       Frequency and Duration        Pertinent Vitals/Pain NA    SLP Swallow Goals     Swallow Study Prior Functional Status       General HPI: Pt isa 57 year old male with a history of Alcoholism, his last drink was 4 days prior to admission, past medical history of tobacco abuse, but no primary care doctor. Pt dx with Nausea and vomiting due to to PNA (pneumonia)/Mass  of lung/Leukocytosis. Pt reports difficulty swallowing due to nausea and vomiting. Pt with likely a postobstructive pna due to a 5/6cm mass in the right upper lung. Type of Study: Bedside swallow evaluation Diet Prior to this Study: NPO Temperature Spikes Noted: No Respiratory Status: Room air History of Recent Intubation: No Behavior/Cognition: Alert;Cooperative;Pleasant mood Oral Cavity - Dentition: Poor condition;Missing dentition Self-Feeding Abilities: Able to feed self Patient Positioning: Upright in bed Baseline Vocal Quality: Clear Volitional Cough: Strong Volitional Swallow: Able to elicit    Oral/Motor/Sensory Function Overall Oral Motor/Sensory Function: Appears within functional limits for tasks assessed   Ice Chips     Thin Liquid Thin Liquid: Within functional limits    Nectar Thick Nectar Thick Liquid: Not tested   Honey Thick Honey Thick Liquid: Not tested   Puree Puree: Within functional limits   Solid   GO    Solid: Within functional limits      [facility], [place] CCC-SLP [phone]  Claudine Mouton 03/07/2013,10:23 AM

## 2013-03-07 NOTE — Progress Notes (Signed)
Pharmacy to Renally Adjust Abx  Admit Complaint:  57 yo male admitted with complaints of N/V when swallowing.  He was found to have a lung mass as well as possible pneumonia.  He was started on IV Azithromycin 500 mg every 24 hours and Ceftriaxone 1 gm every 24 hours.  Pharmacy was asked to adjust antibiotics for renal fxn.  Plan:  Continue antibiotics as ordered - no renal dose adjustment required.  Nadara Mustard, PharmD., MS Clinical Pharmacist Pager:  [phone] Thank you for allowing pharmacy to be part of this patients care team.

## 2013-03-07 NOTE — Progress Notes (Signed)
Inpatient Diabetes Program Recommendations  AACE/ADA: New Consensus Statement on Inpatient Glycemic Control (2013)  Target Ranges:  Prepandial:   less than 140 mg/dL      Peak postprandial:   less than 180 mg/dL (1-2 hours)      Critically ill patients:  140 - 180 mg/dL   Inpatient Diabetes Program Recommendations HgbA1C: =7.6 no prior diagnosis of Diabetes  MD please address. Thank you  Piedad Climes BSN, RN,CDE Inpatient Diabetes Coordinator [phone] (team pager)

## 2013-03-08 ENCOUNTER — Encounter (HOSPITAL_COMMUNITY): Payer: Self-pay | Admitting: Radiology

## 2013-03-08 ENCOUNTER — Inpatient Hospital Stay (HOSPITAL_COMMUNITY): Payer: Medicaid Other

## 2013-03-08 DIAGNOSIS — R112 Nausea with vomiting, unspecified: Secondary | ICD-10-CM

## 2013-03-08 DIAGNOSIS — E440 Moderate protein-calorie malnutrition: Secondary | ICD-10-CM

## 2013-03-08 LAB — GLUCOSE, CAPILLARY
Glucose-Capillary: 155 mg/dL — ABNORMAL HIGH (ref 70–99)
Glucose-Capillary: 132 mg/dL — ABNORMAL HIGH (ref 70–99)
Glucose-Capillary: 212 mg/dL — ABNORMAL HIGH (ref 70–99)

## 2013-03-08 LAB — BASIC METABOLIC PANEL
BUN: 16 mg/dL (ref 6–23)
CO2: 18 mEq/L — ABNORMAL LOW (ref 19–32)
Calcium: 9 mg/dL (ref 8.4–10.5)
Glucose, Bld: 169 mg/dL — ABNORMAL HIGH (ref 70–99)
Sodium: 136 mEq/L (ref 135–145)

## 2013-03-08 MED ORDER — MIDAZOLAM HCL 2 MG/2ML IJ SOLN
INTRAMUSCULAR | Status: AC
  Filled 2013-03-08: qty 4

## 2013-03-08 MED ORDER — IOHEXOL 300 MG/ML  SOLN
100.0000 mL | Freq: Once | INTRAMUSCULAR | Status: AC | PRN
  Administered 2013-03-08: 100 mL via INTRAVENOUS

## 2013-03-08 MED ORDER — FENTANYL CITRATE 0.05 MG/ML IJ SOLN
INTRAMUSCULAR | Status: AC
  Filled 2013-03-08: qty 4

## 2013-03-08 MED ORDER — IOHEXOL 300 MG/ML  SOLN
25.0000 mL | INTRAMUSCULAR | Status: AC
  Administered 2013-03-08 (×2): 25 mL via ORAL

## 2013-03-08 MED ORDER — LIVING WELL WITH DIABETES BOOK
Freq: Once | Status: AC
  Administered 2013-03-08: 17:00:00
  Filled 2013-03-08: qty 1

## 2013-03-08 MED ORDER — PREDNISONE 20 MG PO TABS
40.0000 mg | ORAL_TABLET | Freq: Every day | ORAL | Status: DC
  Administered 2013-03-09: 40 mg via ORAL
  Filled 2013-03-08 (×3): qty 2

## 2013-03-08 NOTE — Progress Notes (Signed)
Patient ID: Rodney Powers  male  ZOX:[id number]    DOB: 14-Jan-1956    DOA: 03/06/2013  PCP: Default, Provider, MD  Assessment/Plan:   PNA likely postobstructive/ 5x6cm mass right upper lung/Leukocytosis/ metastasis:  - CT findings suspicious for metastatic disease, post obstructive PNA  - Pulmonology following - US guided biopsy was planned today but cancelled. According to Dr Grace Isaac, the liver lesions on CT chest appears most likely as hemangiomas and have been stable compared to previous imaging. CT of the abdomen is ordered today to further characterize these lesions and also sec to abd distension - will defer to pulm regarding bronchoscopy and biopsy for diagnosis   Hyponatremia: improved, likely sec to decreased intravascular volume but also has pulm process which can cause SIADH  AKI (acute kidney injury): resolved, likely secondary to nausea and vomiting - cont IV fluids   Protein-calorie malnutrition, moderate: albumin is 2.6 possibly sec to malignancy, Etoh  - nutrition consult  Alcohol abuse:  - cont CIWA protocol, stable  Hyperglycemia: most likely secondary to his infectious process, on solumedrol with underlying new diagnosis borderline DM.  -  on low dose Levemir plus sliding scale insulin.  -  hemoglobin A1c 7.6.  - dc IV Solu-Medrol, transitioned to oral prednisone tomorrow, hopefully BS will improve   DVT Prophylaxis:   Code Status:  Disposition:    Subjective: no chest pain, shortness of breath fevers or chills, +abdominal distention last bowel movement yesterday Objective: Weight change:   Intake/Output Summary (Last 24 hours) at 03/08/13 1503 Last data filed at 03/08/13 1000  Gross per 24 hour  Intake   1564 ml  Output   1200 ml  Net    364 ml   Blood pressure 142/72, pulse 65, temperature 96.6 F (35.9 C), temperature source Axillary, resp. rate 20, height 6\' 3"  (1.905 m), weight 99.791 kg (220 lb), SpO2 97.00%.  Physical Exam: General: Alert  and awake, oriented x3, NAD CVS: S1-S2 clear, no murmur rubs or gallops Chest: dec BS at bses  Abdomen: distended, soft , BS+  Extremities: no cyanosis, clubbing or edema  b/l   Lab Results: Basic Metabolic Panel:  Recent Labs Lab 03/07/13 0910 03/08/13 0630  NA 135 136  K 4.3 4.5  CL 103 105  CO2 21 18*  GLUCOSE 159* 169*  BUN 11 16  CREATININE 0.81 0.82  CALCIUM 8.9 9.0   Liver Function Tests:  Recent Labs Lab 03/06/13 1009  AST 46*  ALT 51  ALKPHOS 99  BILITOT 0.6  PROT 8.1  ALBUMIN 2.6*    Recent Labs Lab 03/06/13 1009  LIPASE 32   No results found for this basename: AMMONIA,  in the last 168 hours CBC:  Recent Labs Lab 03/06/13 1009 03/06/13 1759 03/07/13 0511  WBC 15.3* 13.7* 13.0*  NEUTROABS 12.8*  --   --   HGB 11.9* 10.1* 11.1*  HCT 34.7* 29.9* 33.5*  MCV 75.3* 73.5* 75.3*  PLT 476* 387 442*   Cardiac Enzymes: No results found for this basename: CKTOTAL, CKMB, CKMBINDEX, TROPONINI,  in the last 168 hours BNP: No components found with this basename: POCBNP,  CBG:  Recent Labs Lab 03/07/13 1204 03/07/13 1718 03/07/13 2138 03/08/13 0735 03/08/13 1212  GLUCAP 163* 266* 115* 155* 132*     Micro Results: Recent Results (from the past 240 hour(s))  CULTURE, BLOOD (ROUTINE X 2)     Status: None   Collection Time    03/06/13  4:42 PM  Result Value Range Status   Specimen Description BLOOD RIGHT ARM   Final   Special Requests BOTTLES DRAWN AEROBIC AND ANAEROBIC 7CC   Final   Culture  Setup Time 03/06/2013 21:15   Final   Culture     Final   Value:        BLOOD CULTURE RECEIVED NO GROWTH TO DATE CULTURE WILL BE HELD FOR 5 DAYS BEFORE ISSUING A FINAL NEGATIVE REPORT   Report Status PENDING   Incomplete  CULTURE, BLOOD (ROUTINE X 2)     Status: None   Collection Time    03/06/13  4:49 PM      Result Value Range Status   Specimen Description BLOOD LEFT HAND   Final   Special Requests BOTTLES DRAWN AEROBIC AND ANAEROBIC 10CC    Final   Culture  Setup Time 03/06/2013 21:15   Final   Culture     Final   Value:        BLOOD CULTURE RECEIVED NO GROWTH TO DATE CULTURE WILL BE HELD FOR 5 DAYS BEFORE ISSUING A FINAL NEGATIVE REPORT   Report Status PENDING   Incomplete  CULTURE, BLOOD (ROUTINE X 2)     Status: None   Collection Time    03/06/13  6:00 PM      Result Value Range Status   Specimen Description BLOOD RIGHT ARM   Final   Special Requests BOTTLES DRAWN AEROBIC AND ANAEROBIC 10CC EA   Final   Culture  Setup Time 03/07/2013 01:43   Final   Culture     Final   Value:        BLOOD CULTURE RECEIVED NO GROWTH TO DATE CULTURE WILL BE HELD FOR 5 DAYS BEFORE ISSUING A FINAL NEGATIVE REPORT   Report Status PENDING   Incomplete    Studies/Results: Ct Chest W Contrast  03/06/2013  *RADIOLOGY REPORT*  Clinical Data: Mass noted on x-ray  CT CHEST WITH CONTRAST  Technique:  Multidetector CT imaging of the chest was performed following the standard protocol during bolus administration of intravenous contrast.  Contrast: 80mL OMNIPAQUE IOHEXOL 300 MG/ML  SOLN  Comparison: Chest x-ray same day  Findings: Sagittal images of the spine are unremarkable.  The images of the thoracic inlet are unremarkable.  Central airways are patent.  As noted on chest x-rays there is a nodular mass or confluent adenopathy in the left mediastinum extending in the left upper lobe suprahilar region.  There is low density centrally suspicious for necrosis the lesion measures at least 6.5 x 5.5 cm highly suspicious for malignancy.  Correlation with bronchoscopy and/or biopsy is recommended.  A AP window lymph node measures 1.4 x 1.5 cm.  Right pretracheal lymph node measures 1.6 x 1.3 cm.  Left hilar lymph node measures 2 x 1.9 cm.  No right hilar adenopathy is noted.  Images of the lung parenchyma shows bilateral interstitial prominence.  There is interstitial thickening with patchy airspace disease in the left upper lobe anteriorly suspicious for  postobstructive pneumonia.  Large paraseptal emphysematous bullae are noted in the upper lobes bilaterally.  Small hiatal hernia.  There is a low density lesion in the right hepatic lobe laterally measures 1.7 cm.  Second low density lesion in the right hepatic lobe measures 2.2 cm.  These are highly suspicious for metastatic disease.  Further evaluation is recommended.  No adrenal gland mass is noted.  Bilateral nonspecific axillary lymph nodes are noted.  IMPRESSION:  1.  There is a mass  or confluent adenopathy in the left mediastinum//suprahilar region measures 6.5 x 5.5 cm.  This is highly suspicious for malignancy.  Further evaluation with bronchoscopy and/or biopsy is recommended. 2.  There are pathologic mediastinal and left hilar lymph nodes. 3.  Airspace disease in the left upper lobe anteriorly suspicious for postobstructive pneumonia. 4.  Extensive  emphysematous changes bilateral upper lobe. 5.  Low density lesions are noted in the right hepatic lobe suspicious for metastatic disease.  Further evaluation is recommended.  Critical findings discussed with Dr. Ethelda Chick   Original Report Authenticated By: Natasha Mead, M.D.    Dg Abd Acute W/chest  03/06/2013  *RADIOLOGY REPORT*  Clinical Data: Abdominal pain, emesis.  ACUTE ABDOMEN SERIES (ABDOMEN 2 VIEW & CHEST 1 VIEW)  Comparison: 10/13/2011  Findings: Poorly marginated left hilar mass-like opacity.  Streaky left perihilar interstitial and alveolar opacities.  Stable prominent right perihilar interstitial markings.  No effusion. Heart size normal.  No free air. Small bowel and colon are nondilated.  Scattered fluid levels on the erect radiograph.  IMPRESSION:  1.  Left perihilar mass-like opacity with adjacent peripheral disease suggesting obstructing mass or pneumonia.  CT chest with contrast may be useful for further characterization. 2.  Nonobstructive bowel gas pattern with scattered fluid levels, suggesting possible early ileus.   Original Report  Authenticated By: D. Andria Rhein, MD     Medications: Scheduled Meds: . azithromycin  500 mg Oral Q24H  . cefTRIAXone (ROCEPHIN)  IV  1 g Intravenous Q24H  . folic acid  1 mg Oral Daily  . heparin  5,000 Units Subcutaneous Q8H  . insulin aspart  0-15 Units Subcutaneous TID WC  . insulin aspart  0-5 Units Subcutaneous QHS  . insulin aspart  4 Units Subcutaneous TID WC  . insulin detemir  5 Units Subcutaneous QHS  . living well with diabetes book   Does not apply Once  . LORazepam  0-4 mg Intravenous Q6H   Followed by  . LORazepam  0-4 mg Intravenous Q12H  . methylPREDNISolone (SOLU-MEDROL) injection  60 mg Intravenous Q12H  . multivitamin with minerals  1 tablet Oral Daily  . polyethylene glycol  17 g Oral Daily  . thiamine  100 mg Oral Daily   Or  . thiamine  100 mg Intravenous Daily      LOS: 2 days   Earla Osei M.D. [facility] 03/08/2013, 3:03 PM Pager: [phone]  If 7PM-7AM, please contact night-coverage [email or web address] Password [email or web address]

## 2013-03-08 NOTE — Progress Notes (Signed)
NUTRITION FOLLOW UP  Intervention:   1. Brief education; handout lefts for pt review.  RD will continue to follow to assess for education needs and ongoing management of intake and blood glucose.  Nutrition Dx:   Unintended wt change, ongoing.  Monitor:   1. Food/Beverage; intake sufficient to meet >/=90% estimated needs.  Ongoing, pt with improved PO intake 2. Wt/wt change; monitor trends.  Ongoing.   Assessment:   Pt admitted with N/V, found to have post-obstructive PNA and CT findings suspicious for metastatic disease.   Pt underwent liver biopsy in IR this am.  RD following pt for reported wt loss and poor PO.  Pt with tolerance of diet since admission.  MD has dx with moderate malnutrition of chronic illness.  Note that albumin has a half-life of 21 days and is strongly affected by stress response and inflammatory process, therefore, do not expect to see an improvement in this lab value during acute hospitalization.  Consult received today for education r/t to new dx of Type 2 Diabetes.   Lab Results  Component Value Date   HGBA1C 7.6* 03/06/2013   RD visited pt who was soundly asleep.  Left handout in room with contact information and will plan to revisit pt once awake, alert, and appropriate for education.   Height: Ht Readings from Last 1 Encounters:  03/06/13 6\' 3"  (1.905 m)    Weight Status:   Wt Readings from Last 1 Encounters:  03/06/13 220 lb (99.791 kg)    Re-estimated needs:  Kcal: 2300-2500 Protein: 80-100g Fluid: >2.3 L/day  Skin: intact  Diet Order: Carb Control   Intake/Output Summary (Last 24 hours) at 03/08/13 1539 Last data filed at 03/08/13 1000  Gross per 24 hour  Intake   1564 ml  Output   1200 ml  Net    364 ml    Last BM: 3/24  Labs:   Recent Labs Lab 03/07/13 0511 03/07/13 0910 03/08/13 0630  NA 136 135 136  K 4.5 4.3 4.5  CL 103 103 105  CO2 23 21 18*  BUN 10 11 16   CREATININE 0.93 0.81 0.82  CALCIUM 8.9 8.9 9.0  GLUCOSE  180* 159* 169*    CBG (last 3)   Recent Labs  03/07/13 2138 03/08/13 0735 03/08/13 1212  GLUCAP 115* 155* 132*    Scheduled Meds: . azithromycin  500 mg Oral Q24H  . cefTRIAXone (ROCEPHIN)  IV  1 g Intravenous Q24H  . folic acid  1 mg Oral Daily  . heparin  5,000 Units Subcutaneous Q8H  . insulin aspart  0-15 Units Subcutaneous TID WC  . insulin aspart  0-5 Units Subcutaneous QHS  . insulin aspart  4 Units Subcutaneous TID WC  . insulin detemir  5 Units Subcutaneous QHS  . living well with diabetes book   Does not apply Once  . LORazepam  0-4 mg Intravenous Q6H   Followed by  . LORazepam  0-4 mg Intravenous Q12H  . multivitamin with minerals  1 tablet Oral Daily  . polyethylene glycol  17 g Oral Daily  . [START ON 03/09/2013] predniSONE  40 mg Oral Q breakfast  . thiamine  100 mg Oral Daily   Or  . thiamine  100 mg Intravenous Daily    Continuous Infusions: . sodium chloride 100 mL/hr at 03/08/13 1000    Loyce Dys, MS RD LDN Clinical Inpatient Dietitian Pager: [phone] Weekend/After hours pager: [phone]

## 2013-03-08 NOTE — ED Notes (Signed)
Procedure cancelled Dr Grace Isaac At [facility] d/w pt POC

## 2013-03-08 NOTE — Progress Notes (Addendum)
Inpatient Diabetes Program Recommendations  AACE/ADA: New Consensus Statement on Inpatient Glycemic Control (2013)  Target Ranges:  Prepandial:   less than 140 mg/dL      Peak postprandial:   less than 180 mg/dL (1-2 hours)      Critically ill patients:  140 - 180 mg/dL    Results for Rodney Powers, Rodney Powers (MRN [id number]) as of 03/08/2013 14:25  Ref. Range 03/06/2013 17:59  Hemoglobin A1C Latest Range: <5.7 % 7.6 (H)   No previous diagnosis of diabetes.  Spoke with patient about his elevated glucose levels and his elevated A1c.  Explained what an A1c is and what it measures with this patient.  Discussed the possibility that he may have a diagnosis of diabetes and may need medication/lifestyle changes to manage his glucose levels at home.  Patient receptive to information and told me that he did have increased thirst/urination and blurry vision before admission.  Explained to patient that these may have been s/sxs of hyperglycemia.   Addendum 1425pm: Spoke with Dr. Isidoro Donning about this patient.  Dr. Isidoro Donning stated that based on his A1c and his glucose readings here in hospital that patient does have a diagnosis of diabetes.  Will order RD consult for diet education.  RNs to provide ongoing DM education at bedside.  When preparing for discharge, please consider patient's financial constraints.  No insurance listed.  Consider oral generics that can be purchased for $4 at [facility].  Metformin may not be a good option considering questionable liver mass.  May consider low dose sulfonlyurea like Amaryl for home (Amaryl 1-2 mg daily in AM).  Will follow. Ambrose Finland RN, MSN, CDE Diabetes Coordinator Inpatient Diabetes Program [phone]

## 2013-03-08 NOTE — Consult Note (Signed)
PULMONARY  / CRITICAL CARE MEDICINE  Name: Rodney Powers MRN: [id number] DOB: [birth year]    ADMISSION DATE:  03/06/2013 CONSULTATION DATE:  3-24  REFERRING MD :  triad  CHIEF COMPLAINT: Abd pain  BRIEF PATIENT DESCRIPTION:  57 yo unemployed [name] with 1 PPD tobacco use since age 48 who presented on 3-23 with CC: abd pain, N/v x 4 days prior to admit. + for chills sweats and non productive cough. He can normally ambulate 1/2 mile without SOB. No complaints of PND, orthopnea    CULTURES: 3/23 bc x 2>> 3-23 sputum>>   ANTIBIOTICS: 3-23 zithro>> 3-23 Roc>>  HISTORY OF PRESENT ILLNESS:   57 yo unemployed [name] with 1 PPD tobacco use since age 46 who presented on 3-23 with CC: abd pain, N/v x 4 days prior to admit. + for chills sweats and non productive cough. He can normally ambulate 1/2 mile without SOB. No complaints of PND, orthopnea. He is reported to consume etoh on regular bases last 4 days prior to admit.  CT of abd revealed lung lesions/post obstuctive changes and CT chest was concerning for metastatic dz. PCCM asked to evaluate for possible FOB.    VITAL SIGNS: Temp:  [95.7 F (35.4 C)-97.3 F (36.3 C)] 97.3 F (36.3 C) (03/25 0900) Pulse Rate:  [76-91] 83 (03/25 0900) Resp:  [16-18] 16 (03/25 0900) BP: (120-140)/(76-90) 140/90 mmHg (03/25 0900) SpO2:  [96 %-98 %] 97 % (03/25 0900)  PHYSICAL EXAMINATION: General: WNWDAAM with dull affect Neuro:  Dull affect but intact HEENT:  Poor dentation with multiple missing teeth. No caries noted, rt temporal lipoma noted. Neck:  No LAN Cardiovascular:  HSR RRR Lungs:  Decrease bs bases.No increase wob at rest Abdomen:  +bs , obese, tender Musculoskeletal:  intact Skin:  Warm, no edema   Recent Labs Lab 03/07/13 0511 03/07/13 0910 03/08/13 0630  NA 136 135 136  K 4.5 4.3 4.5  CL 103 103 105  CO2 23 21 18*  BUN 10 11 16   CREATININE 0.93 0.81 0.82  GLUCOSE 180* 159* 169*    Recent Labs Lab 03/06/13 1009  03/06/13 1759 03/07/13 0511  HGB 11.9* 10.1* 11.1*  HCT 34.7* 29.9* 33.5*  WBC 15.3* 13.7* 13.0*  PLT 476* 387 442*   Ct Chest W Contrast  03/06/2013  *RADIOLOGY REPORT*  Clinical Data: Mass noted on x-ray  CT CHEST WITH CONTRAST  Technique:  Multidetector CT imaging of the chest was performed following the standard protocol during bolus administration of intravenous contrast.  Contrast: 80mL OMNIPAQUE IOHEXOL 300 MG/ML  SOLN  Comparison: Chest x-ray same day  Findings: Sagittal images of the spine are unremarkable.  The images of the thoracic inlet are unremarkable.  Central airways are patent.  As noted on chest x-rays there is a nodular mass or confluent adenopathy in the left mediastinum extending in the left upper lobe suprahilar region.  There is low density centrally suspicious for necrosis the lesion measures at least 6.5 x 5.5 cm highly suspicious for malignancy.  Correlation with bronchoscopy and/or biopsy is recommended.  A AP window lymph node measures 1.4 x 1.5 cm.  Right pretracheal lymph node measures 1.6 x 1.3 cm.  Left hilar lymph node measures 2 x 1.9 cm.  No right hilar adenopathy is noted.  Images of the lung parenchyma shows bilateral interstitial prominence.  There is interstitial thickening with patchy airspace disease in the left upper lobe anteriorly suspicious for postobstructive pneumonia.  Large paraseptal emphysematous bullae are noted  in the upper lobes bilaterally.  Small hiatal hernia.  There is a low density lesion in the right hepatic lobe laterally measures 1.7 cm.  Second low density lesion in the right hepatic lobe measures 2.2 cm.  These are highly suspicious for metastatic disease.  Further evaluation is recommended.  No adrenal gland mass is noted.  Bilateral nonspecific axillary lymph nodes are noted.  IMPRESSION:  1.  There is a mass or confluent adenopathy in the left mediastinum//suprahilar region measures 6.5 x 5.5 cm.  This is highly suspicious for malignancy.   Further evaluation with bronchoscopy and/or biopsy is recommended. 2.  There are pathologic mediastinal and left hilar lymph nodes. 3.  Airspace disease in the left upper lobe anteriorly suspicious for postobstructive pneumonia. 4.  Extensive  emphysematous changes bilateral upper lobe. 5.  Low density lesions are noted in the right hepatic lobe suspicious for metastatic disease.  Further evaluation is recommended.  Critical findings discussed with Dr. Ethelda Chick   Original Report Authenticated By: Natasha Mead, M.D.    Ct Pelvis Limited W/o Cm  03/07/2013  *RADIOLOGY REPORT*  Clinical Data:  COPD, left upper lobe hilar mass  CT PELVIS WITHOUT CONTRAST  Technique:  Multidetector CT imaging of the pelvis was performed following the standard protocol without intravenous contrast.  Comparison:  03/06/2013, 10/13/2011  Findings:  Limited noncontrast CT imaging performed through the pelvic sacrum in preparation for presumed biopsy.  This was done with the patient prone.  Noncontrast imaging fails to demonstrate a sacral mass by CT.  No significant destructive osseous lesion.  No adenopathy.  Vascular calcifications noted.  Upon further chart review, CT sacral biopsy was ordered incorrectly on this patient Rodney, Powers. 08-14-1956). The patient with a sacral mass is Rodney, Powers.  Findings discussed with Dr.Alva  IMPRESSION: Limited pelvic CT negative for a sacral mass.  See above comment. No biopsy performed.  The patient should be credited for this exam.   Original Report Authenticated By: Judie Petit. Shick, M.D.    Dg Abd Acute W/chest  03/06/2013  *RADIOLOGY REPORT*  Clinical Data: Abdominal pain, emesis.  ACUTE ABDOMEN SERIES (ABDOMEN 2 VIEW & CHEST 1 VIEW)  Comparison: 10/13/2011  Findings: Poorly marginated left hilar mass-like opacity.  Streaky left perihilar interstitial and alveolar opacities.  Stable prominent right perihilar interstitial markings.  No effusion. Heart size normal.  No free air. Small  bowel and colon are nondilated.  Scattered fluid levels on the erect radiograph.  IMPRESSION:  1.  Left perihilar mass-like opacity with adjacent peripheral disease suggesting obstructing mass or pneumonia.  CT chest with contrast may be useful for further characterization. 2.  Nonobstructive bowel gas pattern with scattered fluid levels, suggesting possible early ileus.   Original Report Authenticated By: D. Andria Rhein, MD     ASSESSMENT / PLAN:  57 yo smoker admitted 3-23 with abd pain, N/V. Ct abd with Ct findings suspicious for metastatic disease, asdz lul ?post obstructive pna,rt paratracheal lymph node.  Plan: T2N3 possibly M1  -likely stg 4  Sites to biopsy would include endobronchial [place] with needle aspiration of rt paratracheal Ln (will have to schedule in OR)  vs US biopsy of liver lesion vs sacral bx -appears superficial, will discuss with IR (Dr Vassie Loll)  Hyponatremia - resolved, may be related to malignancy  Acute renal failure- dehydration , resolved  Ileus - obtain CT abdomen/ pelvis with PO & IV contrast  Liver lesions- favor mets   ETOH abuse - w/f withdrawal  Brett Canales Minor ACNP [facility] PCC[facility] Pager [phone] till 3 pm If no answer page [phone] 03/08/2013, 10:22 AM  Attending Addendum:  The liver lesions look like hemangiomas, would likely not be diagnostic. There is no sacral mass (as initially reported). FOB + EBUS is the likely best choice here. I will discuss with Dr Vassie Loll and we will help you coordinate, probably outpatient.   Levy Pupa, MD, PhD 03/08/2013, 4:21 PM [facility] [phone] or if no answer [phone]

## 2013-03-08 NOTE — H&P (Signed)
Biopsy not performed as lesions appeared to represent stable hepatic hemangiomas.   Please refer to right upper quadrant ultrasound report. D/W Dr. Isidoro Donning at the time of procedure cancellation.

## 2013-03-08 NOTE — H&P (Signed)
Rodney Powers is an 57 y.o. male.   Chief Complaint: liver lesion abd pain; sweats and chills etoh abuse Work up shows liver lesion; lymphadenopathy; lung mass Scheduled now for liver mass biopsy  HPI: COPD  Past Medical History  Diagnosis Date  . Back pain, chronic   . COPD (chronic obstructive pulmonary disease)     History reviewed. No pertinent past surgical history.  Family History  Problem Relation Age of Onset  . Aneurysm Mother   . Heart attack Father    Social History:  reports that he has been smoking Cigarettes.  He has been smoking about 1.00 pack per day. He does not have any smokeless tobacco history on file. He reports that he drinks about 3.6 ounces of alcohol per week. He reports that he uses illicit drugs (Marijuana).  Allergies: No Known Allergies  No prescriptions prior to admission    Results for orders placed during the hospital encounter of 03/06/13 (from the past 48 hour(s))  CBC WITH DIFFERENTIAL     Status: Abnormal   Collection Time    03/06/13 10:09 AM      Result Value Range   WBC 15.3 (*) 4.0 - 10.5 K/uL   RBC 4.61  4.22 - 5.81 MIL/uL   Hemoglobin 11.9 (*) 13.0 - 17.0 g/dL   HCT 40.9 (*) 81.1 - 91.4 %   MCV 75.3 (*) 78.0 - 100.0 fL   MCH 25.8 (*) 26.0 - 34.0 pg   MCHC 34.3  30.0 - 36.0 g/dL   RDW 78.2  95.6 - 21.3 %   Platelets 476 (*) 150 - 400 K/uL   Neutrophils Relative 83 (*) 43 - 77 %   Neutro Abs 12.8 (*) 1.7 - 7.7 K/uL   Lymphocytes Relative 9 (*) 12 - 46 %   Lymphs Abs 1.3  0.7 - 4.0 K/uL   Monocytes Relative 8  3 - 12 %   Monocytes Absolute 1.2 (*) 0.1 - 1.0 K/uL   Eosinophils Relative 0  0 - 5 %   Eosinophils Absolute 0.0  0.0 - 0.7 K/uL   Basophils Relative 0  0 - 1 %   Basophils Absolute 0.0  0.0 - 0.1 K/uL  COMPREHENSIVE METABOLIC PANEL     Status: Abnormal   Collection Time    03/06/13 10:09 AM      Result Value Range   Sodium 131 (*) 135 - 145 mEq/L   Potassium 4.0  3.5 - 5.1 mEq/L   Chloride 93 (*) 96 - 112  mEq/L   CO2 24  19 - 32 mEq/L   Glucose, Bld 250 (*) 70 - 99 mg/dL   BUN 10  6 - 23 mg/dL   Creatinine, Ser 0.86  0.50 - 1.35 mg/dL   Calcium 9.1  8.4 - 57.8 mg/dL   Total Protein 8.1  6.0 - 8.3 g/dL   Albumin 2.6 (*) 3.5 - 5.2 g/dL   AST 46 (*) 0 - 37 U/L   ALT 51  0 - 53 U/L   Alkaline Phosphatase 99  39 - 117 U/L   Total Bilirubin 0.6  0.3 - 1.2 mg/dL   GFR calc non Af Amer 71 (*) >90 mL/min   GFR calc Af Amer 82 (*) >90 mL/min   Comment:            The eGFR has been calculated     using the CKD EPI equation.     This calculation has not been  validated in all clinical     situations.     eGFR's persistently     <90 mL/min signify     possible Chronic Kidney Disease.  LIPASE, BLOOD     Status: None   Collection Time    03/06/13 10:09 AM      Result Value Range   Lipase 32  11 - 59 U/L  URINALYSIS, MICROSCOPIC ONLY     Status: Abnormal   Collection Time    03/06/13 10:26 AM      Result Value Range   Color, Urine ORANGE (*) YELLOW   Comment: BIOCHEMICALS MAY BE AFFECTED BY COLOR   APPearance CLEAR  CLEAR   Specific Gravity, Urine 1.024  1.005 - 1.030   pH 5.5  5.0 - 8.0   Glucose, UA NEGATIVE  NEGATIVE mg/dL   Hgb urine dipstick MODERATE (*) NEGATIVE   Bilirubin Urine SMALL (*) NEGATIVE   Ketones, ur 15 (*) NEGATIVE mg/dL   Protein, ur 30 (*) NEGATIVE mg/dL   Urobilinogen, UA 2.0 (*) 0.0 - 1.0 mg/dL   Nitrite NEGATIVE  NEGATIVE   Leukocytes, UA TRACE (*) NEGATIVE   WBC, UA 0-2  <3 WBC/hpf   RBC / HPF 0-2  <3 RBC/hpf   Squamous Epithelial / LPF RARE  RARE  CULTURE, BLOOD (ROUTINE X 2)     Status: None   Collection Time    03/06/13  4:42 PM      Result Value Range   Specimen Description BLOOD RIGHT ARM     Special Requests BOTTLES DRAWN AEROBIC AND ANAEROBIC 7CC     Culture  Setup Time 03/06/2013 21:15     Culture       Value:        BLOOD CULTURE RECEIVED NO GROWTH TO DATE CULTURE WILL BE HELD FOR 5 DAYS BEFORE ISSUING A FINAL NEGATIVE REPORT   Report  Status PENDING    CULTURE, BLOOD (ROUTINE X 2)     Status: None   Collection Time    03/06/13  4:49 PM      Result Value Range   Specimen Description BLOOD LEFT HAND     Special Requests BOTTLES DRAWN AEROBIC AND ANAEROBIC 10CC     Culture  Setup Time 03/06/2013 21:15     Culture       Value:        BLOOD CULTURE RECEIVED NO GROWTH TO DATE CULTURE WILL BE HELD FOR 5 DAYS BEFORE ISSUING A FINAL NEGATIVE REPORT   Report Status PENDING    CG4 I-STAT (LACTIC ACID)     Status: None   Collection Time    03/06/13  5:12 PM      Result Value Range   Lactic Acid, Venous 1.06  0.5 - 2.2 mmol/L  HIV ANTIBODY (ROUTINE TESTING)     Status: None   Collection Time    03/06/13  5:59 PM      Result Value Range   HIV NON REACTIVE  NON REACTIVE  CBC     Status: Abnormal   Collection Time    03/06/13  5:59 PM      Result Value Range   WBC 13.7 (*) 4.0 - 10.5 K/uL   RBC 4.07 (*) 4.22 - 5.81 MIL/uL   Hemoglobin 10.1 (*) 13.0 - 17.0 g/dL   HCT 16.1 (*) 09.6 - 04.5 %   MCV 73.5 (*) 78.0 - 100.0 fL   MCH 24.8 (*) 26.0 - 34.0 pg   MCHC 33.8  30.0 - 36.0 g/dL   RDW 84.1  32.4 - 40.1 %   Platelets 387  150 - 400 K/uL  CREATININE, SERUM     Status: Abnormal   Collection Time    03/06/13  5:59 PM      Result Value Range   Creatinine, Ser 1.03  0.50 - 1.35 mg/dL   GFR calc non Af Amer 80 (*) >90 mL/min   GFR calc Af Amer >90  >90 mL/min   Comment:            The eGFR has been calculated     using the CKD EPI equation.     This calculation has not been     validated in all clinical     situations.     eGFR's persistently     <90 mL/min signify     possible Chronic Kidney Disease.  HEMOGLOBIN A1C     Status: Abnormal   Collection Time    03/06/13  5:59 PM      Result Value Range   Hemoglobin A1C 7.6 (*) <5.7 %   Comment: (NOTE)                                                                               According to the ADA Clinical Practice Recommendations for 2011, when     HbA1c is used  as a screening test:      >=6.5%   Diagnostic of Diabetes Mellitus               (if abnormal result is confirmed)     5.7-6.4%   Increased risk of developing Diabetes Mellitus     References:Diagnosis and Classification of Diabetes Mellitus,Diabetes     Care,2011,34(Suppl 1):S62-S69 and Standards of Medical Care in             Diabetes - 2011,Diabetes Care,2011,34 (Suppl 1):S11-S61.   Mean Plasma Glucose 171 (*) <117 mg/dL  CULTURE, BLOOD (ROUTINE X 2)     Status: None   Collection Time    03/06/13  6:00 PM      Result Value Range   Specimen Description BLOOD RIGHT ARM     Special Requests BOTTLES DRAWN AEROBIC AND ANAEROBIC 10CC EA     Culture  Setup Time 03/07/2013 01:43     Culture       Value:        BLOOD CULTURE RECEIVED NO GROWTH TO DATE CULTURE WILL BE HELD FOR 5 DAYS BEFORE ISSUING A FINAL NEGATIVE REPORT   Report Status PENDING    GLUCOSE, CAPILLARY     Status: Abnormal   Collection Time    03/06/13  6:06 PM      Result Value Range   Glucose-Capillary 119 (*) 70 - 99 mg/dL   Comment 1 Notify RN    SODIUM, URINE, RANDOM     Status: None   Collection Time    03/06/13  8:16 PM      Result Value Range   Sodium, Ur 106    CREATININE, URINE, RANDOM     Status: None   Collection Time    03/06/13  8:16 PM      Result  Value Range   Creatinine, Urine 93.49    LEGIONELLA ANTIGEN, URINE     Status: None   Collection Time    03/06/13  8:17 PM      Result Value Range   Specimen Description URINE, CLEAN CATCH     Special Requests NONE     Legionella Antigen, Urine Negative for Legionella pneumophilia serogroup 1     Report Status 03/07/2013 FINAL    STREP PNEUMONIAE URINARY ANTIGEN     Status: None   Collection Time    03/06/13  8:17 PM      Result Value Range   Strep Pneumo Urinary Antigen NEGATIVE  NEGATIVE   Comment:            Infection due to S. pneumoniae     cannot be absolutely ruled out     since the antigen present     may be below the detection limit     of  the test.  GLUCOSE, CAPILLARY     Status: Abnormal   Collection Time    03/06/13 10:01 PM      Result Value Range   Glucose-Capillary 171 (*) 70 - 99 mg/dL   Comment 1 Documented in Chart     Comment 2 Notify RN    CBC     Status: Abnormal   Collection Time    03/07/13  5:11 AM      Result Value Range   WBC 13.0 (*) 4.0 - 10.5 K/uL   RBC 4.45  4.22 - 5.81 MIL/uL   Hemoglobin 11.1 (*) 13.0 - 17.0 g/dL   HCT 16.1 (*) 09.6 - 04.5 %   MCV 75.3 (*) 78.0 - 100.0 fL   MCH 24.9 (*) 26.0 - 34.0 pg   MCHC 33.1  30.0 - 36.0 g/dL   RDW 40.9  81.1 - 91.4 %   Platelets 442 (*) 150 - 400 K/uL  BASIC METABOLIC PANEL     Status: Abnormal   Collection Time    03/07/13  5:11 AM      Result Value Range   Sodium 136  135 - 145 mEq/L   Potassium 4.5  3.5 - 5.1 mEq/L   Chloride 103  96 - 112 mEq/L   CO2 23  19 - 32 mEq/L   Glucose, Bld 180 (*) 70 - 99 mg/dL   BUN 10  6 - 23 mg/dL   Creatinine, Ser 7.82  0.50 - 1.35 mg/dL   Calcium 8.9  8.4 - 95.6 mg/dL   GFR calc non Af Amer >90  >90 mL/min   GFR calc Af Amer >90  >90 mL/min   Comment:            The eGFR has been calculated     using the CKD EPI equation.     This calculation has not been     validated in all clinical     situations.     eGFR's persistently     <90 mL/min signify     possible Chronic Kidney Disease.  GLUCOSE, CAPILLARY     Status: Abnormal   Collection Time    03/07/13  8:01 AM      Result Value Range   Glucose-Capillary 173 (*) 70 - 99 mg/dL  BASIC METABOLIC PANEL     Status: Abnormal   Collection Time    03/07/13  9:10 AM      Result Value Range   Sodium 135  135 - 145 mEq/L  Potassium 4.3  3.5 - 5.1 mEq/L   Chloride 103  96 - 112 mEq/L   CO2 21  19 - 32 mEq/L   Glucose, Bld 159 (*) 70 - 99 mg/dL   BUN 11  6 - 23 mg/dL   Creatinine, Ser 2.95  0.50 - 1.35 mg/dL   Calcium 8.9  8.4 - 62.1 mg/dL   GFR calc non Af Amer >90  >90 mL/min   GFR calc Af Amer >90  >90 mL/min   Comment:            The eGFR has been  calculated     using the CKD EPI equation.     This calculation has not been     validated in all clinical     situations.     eGFR's persistently     <90 mL/min signify     possible Chronic Kidney Disease.  GLUCOSE, CAPILLARY     Status: Abnormal   Collection Time    03/07/13 12:04 PM      Result Value Range   Glucose-Capillary 163 (*) 70 - 99 mg/dL  PROTIME-INR     Status: Abnormal   Collection Time    03/07/13 12:48 PM      Result Value Range   Prothrombin Time 16.3 (*) 11.6 - 15.2 seconds   INR 1.34  0.00 - 1.49  APTT     Status: None   Collection Time    03/07/13 12:48 PM      Result Value Range   aPTT 35  24 - 37 seconds  GLUCOSE, CAPILLARY     Status: Abnormal   Collection Time    03/07/13  5:18 PM      Result Value Range   Glucose-Capillary 266 (*) 70 - 99 mg/dL  GLUCOSE, CAPILLARY     Status: Abnormal   Collection Time    03/07/13  9:38 PM      Result Value Range   Glucose-Capillary 115 (*) 70 - 99 mg/dL   Comment 1 Documented in Chart     Comment 2 Notify RN    BASIC METABOLIC PANEL     Status: Abnormal   Collection Time    03/08/13  6:30 AM      Result Value Range   Sodium 136  135 - 145 mEq/L   Potassium 4.5  3.5 - 5.1 mEq/L   Chloride 105  96 - 112 mEq/L   CO2 18 (*) 19 - 32 mEq/L   Glucose, Bld 169 (*) 70 - 99 mg/dL   BUN 16  6 - 23 mg/dL   Creatinine, Ser 3.08  0.50 - 1.35 mg/dL   Calcium 9.0  8.4 - 65.7 mg/dL   GFR calc non Af Amer >90  >90 mL/min   GFR calc Af Amer >90  >90 mL/min   Comment:            The eGFR has been calculated     using the CKD EPI equation.     This calculation has not been     validated in all clinical     situations.     eGFR's persistently     <90 mL/min signify     possible Chronic Kidney Disease.  GLUCOSE, CAPILLARY     Status: Abnormal   Collection Time    03/08/13  7:35 AM      Result Value Range   Glucose-Capillary 155 (*) 70 - 99 mg/dL   Ct Chest W Contrast  03/06/2013  *RADIOLOGY REPORT*  Clinical Data:  Mass noted on x-ray  CT CHEST WITH CONTRAST  Technique:  Multidetector CT imaging of the chest was performed following the standard protocol during bolus administration of intravenous contrast.  Contrast: 80mL OMNIPAQUE IOHEXOL 300 MG/ML  SOLN  Comparison: Chest x-ray same day  Findings: Sagittal images of the spine are unremarkable.  The images of the thoracic inlet are unremarkable.  Central airways are patent.  As noted on chest x-rays there is a nodular mass or confluent adenopathy in the left mediastinum extending in the left upper lobe suprahilar region.  There is low density centrally suspicious for necrosis the lesion measures at least 6.5 x 5.5 cm highly suspicious for malignancy.  Correlation with bronchoscopy and/or biopsy is recommended.  A AP window lymph node measures 1.4 x 1.5 cm.  Right pretracheal lymph node measures 1.6 x 1.3 cm.  Left hilar lymph node measures 2 x 1.9 cm.  No right hilar adenopathy is noted.  Images of the lung parenchyma shows bilateral interstitial prominence.  There is interstitial thickening with patchy airspace disease in the left upper lobe anteriorly suspicious for postobstructive pneumonia.  Large paraseptal emphysematous bullae are noted in the upper lobes bilaterally.  Small hiatal hernia.  There is a low density lesion in the right hepatic lobe laterally measures 1.7 cm.  Second low density lesion in the right hepatic lobe measures 2.2 cm.  These are highly suspicious for metastatic disease.  Further evaluation is recommended.  No adrenal gland mass is noted.  Bilateral nonspecific axillary lymph nodes are noted.  IMPRESSION:  1.  There is a mass or confluent adenopathy in the left mediastinum//suprahilar region measures 6.5 x 5.5 cm.  This is highly suspicious for malignancy.  Further evaluation with bronchoscopy and/or biopsy is recommended. 2.  There are pathologic mediastinal and left hilar lymph nodes. 3.  Airspace disease in the left upper lobe anteriorly  suspicious for postobstructive pneumonia. 4.  Extensive  emphysematous changes bilateral upper lobe. 5.  Low density lesions are noted in the right hepatic lobe suspicious for metastatic disease.  Further evaluation is recommended.  Critical findings discussed with Dr. Ethelda Chick   Original Report Authenticated By: Natasha Mead, M.D.    Ct Pelvis Limited W/o Cm  03/07/2013  *RADIOLOGY REPORT*  Clinical Data:  COPD, left upper lobe hilar mass  CT PELVIS WITHOUT CONTRAST  Technique:  Multidetector CT imaging of the pelvis was performed following the standard protocol without intravenous contrast.  Comparison:  03/06/2013, 10/13/2011  Findings:  Limited noncontrast CT imaging performed through the pelvic sacrum in preparation for presumed biopsy.  This was done with the patient prone.  Noncontrast imaging fails to demonstrate a sacral mass by CT.  No significant destructive osseous lesion.  No adenopathy.  Vascular calcifications noted.  Upon further chart review, CT sacral biopsy was ordered incorrectly on this patient Wilson, Dusenbery. 25-Oct-1956). The patient with a sacral mass is Ramirez, Fullbright.  Findings discussed with Dr.Alva  IMPRESSION: Limited pelvic CT negative for a sacral mass.  See above comment. No biopsy performed.  The patient should be credited for this exam.   Original Report Authenticated By: Judie Petit. Shick, M.D.    Dg Abd Acute W/chest  03/06/2013  *RADIOLOGY REPORT*  Clinical Data: Abdominal pain, emesis.  ACUTE ABDOMEN SERIES (ABDOMEN 2 VIEW & CHEST 1 VIEW)  Comparison: 10/13/2011  Findings: Poorly marginated left hilar mass-like opacity.  Streaky left perihilar interstitial and alveolar opacities.  Stable  prominent right perihilar interstitial markings.  No effusion. Heart size normal.  No free air. Small bowel and colon are nondilated.  Scattered fluid levels on the erect radiograph.  IMPRESSION:  1.  Left perihilar mass-like opacity with adjacent peripheral disease suggesting obstructing  mass or pneumonia.  CT chest with contrast may be useful for further characterization. 2.  Nonobstructive bowel gas pattern with scattered fluid levels, suggesting possible early ileus.   Original Report Authenticated By: D. Andria Rhein, MD     Review of Systems  Constitutional: Negative for fever.  Respiratory: Positive for cough.   Cardiovascular: Negative for chest pain.  Gastrointestinal: Positive for abdominal pain. Negative for nausea and vomiting.  Musculoskeletal: Positive for back pain.    Blood pressure 120/81, pulse 91, temperature 97.3 F (36.3 C), temperature source Oral, resp. rate 16, height 6\' 3"  (1.905 m), weight 220 lb (99.791 kg), SpO2 97.00%. Physical Exam  Constitutional: He is oriented to person, place, and time. He appears well-developed and well-nourished.  Cardiovascular: Normal rate, regular rhythm and normal heart sounds.   No murmur heard. Respiratory: Effort normal. He has wheezes.  GI: Soft. Bowel sounds are normal. There is no tenderness.  Musculoskeletal: Normal range of motion.  Neurological: He is alert and oriented to person, place, and time.  Psychiatric: He has a normal mood and affect. His behavior is normal. Judgment and thought content normal.     Assessment/Plan Pt with liver lesion Lung mass and LAN Scheduled for liver mass bx in [place] Pt aware of procedure benefits and risks and agreeable to proceed Consent signed and in chart  Esias Mory A 03/08/2013, 8:44 AM

## 2013-03-09 LAB — GLUCOSE, CAPILLARY
Glucose-Capillary: 87 mg/dL (ref 70–99)
Glucose-Capillary: 160 mg/dL — ABNORMAL HIGH (ref 70–99)

## 2013-03-09 MED ORDER — TAMSULOSIN HCL 0.4 MG PO CAPS
0.4000 mg | ORAL_CAPSULE | Freq: Every day | ORAL | Status: DC
  Administered 2013-03-09: 0.4 mg via ORAL
  Filled 2013-03-09 (×2): qty 1

## 2013-03-09 NOTE — Progress Notes (Addendum)
Patient ID: Rodney Powers  male  ZOX:[id number]    DOB: 28-Apr-1956    DOA: 03/06/2013  PCP: Default, Provider, MD   Addendum Please note that the the lung mass is 6.5x 5.5cm left perihilar mass, NOT in right upper lung as dictated by error below.    RAI,RIPUDEEP M.D. [facility] 03/10/2013, 2:06 PM  Pager: [phone]     Assessment/Plan:   PNA likely postobstructive/ NEW 5x6cm mass right upper lung/Leukocytosis/ metastasis:  - CT chest findings suspicious for metastatic disease, post obstructive PNA  - Pulmonology following, rec'd endobronchial [place] and needle aspiration of right paratracheal LN, scheduled tomorrow   - Per IR, Dr Grace Isaac, the liver lesions on CT chest appears most likely as hemangiomas and have been stable compared to previous imaging.  - CT of the abdomen showed possible proteinaceous cyst ( Lesions noted in the right lobe of the liver and stable), long segment narrowing of the terminal ileum ?IBD, multiple renal lesions bilaterally, simple cysts marked enlargement of the prostrate gland. - I have arranged oncology appointment on 03/16/13 with Dr Shirline Frees at 1:30pm.   Hyponatremia: improved, likely sec to decreased intravascular volume but also has pulm process which can cause SIADH  AKI (acute kidney injury): resolved, likely secondary to nausea and vomiting  Protein-calorie malnutrition, moderate: albumin is 2.6 possibly sec to malignancy, Etoh   Alcohol abuse: stable, no withdrawal sx  Hyperglycemia: most likely secondary to his infectious process, on solumedrol with underlying new diagnosis borderline DM.  -  on low dose Levemir plus sliding scale insulin.  -  hemoglobin A1c 7.6.   BPH:  - placed on flomax  DVT Prophylaxis:   Code Status:  Disposition: Per pulmonology, okay to DC tomorrow after the bronchoscopy with outpatient oncology referral and PET scan    Subjective: no chest pain, shortness of breath fevers or chill. Sitting up in the chair,  no complaints   Objective: Weight change:   Intake/Output Summary (Last 24 hours) at 03/09/13 1503 Last data filed at 03/09/13 1300  Gross per 24 hour  Intake   2380 ml  Output    950 ml  Net   1430 ml   Blood pressure 142/70, pulse 83, temperature 97.7 F (36.5 C), temperature source Oral, resp. rate 20, height 6\' 3"  (1.905 m), weight 99.791 kg (220 lb), SpO2 96.00%.  Physical Exam: General: AxO x3 NAD CVS: S1-S2 clear, Chest: dec BS at bses  Abdomen: distended, soft , BS+  Extremities: no cyanosis, clubbing or edema  b/l   Lab Results: Basic Metabolic Panel:  Recent Labs Lab 03/07/13 0910 03/08/13 0630  NA 135 136  K 4.3 4.5  CL 103 105  CO2 21 18*  GLUCOSE 159* 169*  BUN 11 16  CREATININE 0.81 0.82  CALCIUM 8.9 9.0   Liver Function Tests:  Recent Labs Lab 03/06/13 1009  AST 46*  ALT 51  ALKPHOS 99  BILITOT 0.6  PROT 8.1  ALBUMIN 2.6*    Recent Labs Lab 03/06/13 1009  LIPASE 32   No results found for this basename: AMMONIA,  in the last 168 hours CBC:  Recent Labs Lab 03/06/13 1009 03/06/13 1759 03/07/13 0511  WBC 15.3* 13.7* 13.0*  NEUTROABS 12.8*  --   --   HGB 11.9* 10.1* 11.1*  HCT 34.7* 29.9* 33.5*  MCV 75.3* 73.5* 75.3*  PLT 476* 387 442*   Cardiac Enzymes: No results found for this basename: CKTOTAL, CKMB, CKMBINDEX, TROPONINI,  in the last 168 hours  BNP: No components found with this basename: POCBNP,  CBG:  Recent Labs Lab 03/08/13 1645 03/08/13 1839 03/08/13 2203 03/09/13 0753 03/09/13 1255  GLUCAP 212* 206* 107* 121* 87     Micro Results: Recent Results (from the past 240 hour(s))  CULTURE, BLOOD (ROUTINE X 2)     Status: None   Collection Time    03/06/13  4:42 PM      Result Value Range Status   Specimen Description BLOOD RIGHT ARM   Final   Special Requests BOTTLES DRAWN AEROBIC AND ANAEROBIC 7CC   Final   Culture  Setup Time 03/06/2013 21:15   Final   Culture     Final   Value:        BLOOD CULTURE  RECEIVED NO GROWTH TO DATE CULTURE WILL BE HELD FOR 5 DAYS BEFORE ISSUING A FINAL NEGATIVE REPORT   Report Status PENDING   Incomplete  CULTURE, BLOOD (ROUTINE X 2)     Status: None   Collection Time    03/06/13  4:49 PM      Result Value Range Status   Specimen Description BLOOD LEFT HAND   Final   Special Requests BOTTLES DRAWN AEROBIC AND ANAEROBIC 10CC   Final   Culture  Setup Time 03/06/2013 21:15   Final   Culture     Final   Value:        BLOOD CULTURE RECEIVED NO GROWTH TO DATE CULTURE WILL BE HELD FOR 5 DAYS BEFORE ISSUING A FINAL NEGATIVE REPORT   Report Status PENDING   Incomplete  CULTURE, BLOOD (ROUTINE X 2)     Status: None   Collection Time    03/06/13  6:00 PM      Result Value Range Status   Specimen Description BLOOD RIGHT ARM   Final   Special Requests BOTTLES DRAWN AEROBIC AND ANAEROBIC 10CC EA   Final   Culture  Setup Time 03/07/2013 01:43   Final   Culture     Final   Value:        BLOOD CULTURE RECEIVED NO GROWTH TO DATE CULTURE WILL BE HELD FOR 5 DAYS BEFORE ISSUING A FINAL NEGATIVE REPORT   Report Status PENDING   Incomplete    Studies/Results: Ct Chest W Contrast  03/06/2013  *RADIOLOGY REPORT*  Clinical Data: Mass noted on x-ray  CT CHEST WITH CONTRAST  Technique:  Multidetector CT imaging of the chest was performed following the standard protocol during bolus administration of intravenous contrast.  Contrast: 80mL OMNIPAQUE IOHEXOL 300 MG/ML  SOLN  Comparison: Chest x-ray same day  Findings: Sagittal images of the spine are unremarkable.  The images of the thoracic inlet are unremarkable.  Central airways are patent.  As noted on chest x-rays there is a nodular mass or confluent adenopathy in the left mediastinum extending in the left upper lobe suprahilar region.  There is low density centrally suspicious for necrosis the lesion measures at least 6.5 x 5.5 cm highly suspicious for malignancy.  Correlation with bronchoscopy and/or biopsy is recommended.  A AP  window lymph node measures 1.4 x 1.5 cm.  Right pretracheal lymph node measures 1.6 x 1.3 cm.  Left hilar lymph node measures 2 x 1.9 cm.  No right hilar adenopathy is noted.  Images of the lung parenchyma shows bilateral interstitial prominence.  There is interstitial thickening with patchy airspace disease in the left upper lobe anteriorly suspicious for postobstructive pneumonia.  Large paraseptal emphysematous bullae are noted in the upper  lobes bilaterally.  Small hiatal hernia.  There is a low density lesion in the right hepatic lobe laterally measures 1.7 cm.  Second low density lesion in the right hepatic lobe measures 2.2 cm.  These are highly suspicious for metastatic disease.  Further evaluation is recommended.  No adrenal gland mass is noted.  Bilateral nonspecific axillary lymph nodes are noted.  IMPRESSION:  1.  There is a mass or confluent adenopathy in the left mediastinum//suprahilar region measures 6.5 x 5.5 cm.  This is highly suspicious for malignancy.  Further evaluation with bronchoscopy and/or biopsy is recommended. 2.  There are pathologic mediastinal and left hilar lymph nodes. 3.  Airspace disease in the left upper lobe anteriorly suspicious for postobstructive pneumonia. 4.  Extensive  emphysematous changes bilateral upper lobe. 5.  Low density lesions are noted in the right hepatic lobe suspicious for metastatic disease.  Further evaluation is recommended.  Critical findings discussed with Dr. Ethelda Chick   Original Report Authenticated By: Natasha Mead, M.D.    Dg Abd Acute W/chest  03/06/2013  *RADIOLOGY REPORT*  Clinical Data: Abdominal pain, emesis.  ACUTE ABDOMEN SERIES (ABDOMEN 2 VIEW & CHEST 1 VIEW)  Comparison: 10/13/2011  Findings: Poorly marginated left hilar mass-like opacity.  Streaky left perihilar interstitial and alveolar opacities.  Stable prominent right perihilar interstitial markings.  No effusion. Heart size normal.  No free air. Small bowel and colon are nondilated.   Scattered fluid levels on the erect radiograph.  IMPRESSION:  1.  Left perihilar mass-like opacity with adjacent peripheral disease suggesting obstructing mass or pneumonia.  CT chest with contrast may be useful for further characterization. 2.  Nonobstructive bowel gas pattern with scattered fluid levels, suggesting possible early ileus.   Original Report Authenticated By: D. Andria Rhein, MD     Medications: Scheduled Meds: . azithromycin  500 mg Oral Q24H  . cefTRIAXone (ROCEPHIN)  IV  1 g Intravenous Q24H  . folic acid  1 mg Oral Daily  . heparin  5,000 Units Subcutaneous Q8H  . insulin aspart  0-15 Units Subcutaneous TID WC  . insulin aspart  0-5 Units Subcutaneous QHS  . insulin aspart  4 Units Subcutaneous TID WC  . insulin detemir  5 Units Subcutaneous QHS  . LORazepam  0-4 mg Intravenous Q12H  . multivitamin with minerals  1 tablet Oral Daily  . predniSONE  40 mg Oral Q breakfast  . thiamine  100 mg Oral Daily      LOS: 3 days   RAI,RIPUDEEP M.D. [facility] 03/09/2013, 3:03 PM Pager: [phone]  If 7PM-7AM, please contact night-coverage [email or web address] Password [email or web address]

## 2013-03-09 NOTE — Progress Notes (Signed)
Pt IV infiltrated. Finished running pt's Rocephin at 2030

## 2013-03-09 NOTE — Progress Notes (Addendum)
Asked NP on call if pt's 6 AM dose of heparin needed to be held due to pt's bronchoscopy in am. Per K Schorr, hold am dose of heparin.

## 2013-03-09 NOTE — Progress Notes (Addendum)
PULMONARY  / CRITICAL CARE MEDICINE  Name: MINOR IDEN MRN: [id number] DOB: 1956/07/06    ADMISSION DATE:  03/06/2013 CONSULTATION DATE:  3-24  REFERRING MD :  [facility]  CHIEF COMPLAINT: Abd pain  BRIEF PATIENT DESCRIPTION:  57 yo unemployed [name] with 1 PPD tobacco use since age 47 who presented on 3-23 with CC: abd pain, N/v x 4 days prior to admit. + for chills sweats and non productive cough. He can normally ambulate 1/2 mile without SOB. No complaints of PND, orthopnea    CULTURES: 3/23 bc x 2>>ng 3-23 sputum>>   ANTIBIOTICS: 3-23 zithro>> 3-23 Roc>>  HISTORY OF PRESENT ILLNESS:   57 yo unemployed [name] with 1 PPD tobacco use since age 46 who presented on 3-23 with CC: abd pain, N/v x 4 days prior to admit. + for chills sweats and non productive cough. He can normally ambulate 1/2 mile without SOB. No complaints of PND, orthopnea. He is reported to consume etoh on regular bases last 4 days prior to admit.  CT of abd revealed lung lesions/post obstuctive changes and CT chest was concerning for metastatic dz. PCCM asked to evaluate for possible FOB.    VITAL SIGNS: Temp:  [96.6 F (35.9 C)-97.5 F (36.4 C)] 97.3 F (36.3 C) (03/26 0607) Pulse Rate:  [61-83] 66 (03/26 0607) Resp:  [14-20] 18 (03/26 0607) BP: (132-156)/(72-90) 132/80 mmHg (03/26 0607) SpO2:  [97 %-100 %] 98 % (03/26 0607)  PHYSICAL EXAMINATION: General: WNWDAAM with dull affect Neuro:  Dull affect but intact HEENT:  Poor dentation with multiple missing teeth. No caries noted Neck:  No LAN Cardiovascular:  HSR RRR Lungs:  Decrease bs bases.No increase wob at rest Abdomen:  +bs , obese, tender Musculoskeletal:  intact Skin:  Warm, no edema   Recent Labs Lab 03/07/13 0511 03/07/13 0910 03/08/13 0630  NA 136 135 136  K 4.5 4.3 4.5  CL 103 103 105  CO2 23 21 18*  BUN 10 11 16   CREATININE 0.93 0.81 0.82  GLUCOSE 180* 159* 169*    Recent Labs Lab 03/06/13 1009 03/06/13 1759 03/07/13 0511   HGB 11.9* 10.1* 11.1*  HCT 34.7* 29.9* 33.5*  WBC 15.3* 13.7* 13.0*  PLT 476* 387 442*   Ct Abdomen Pelvis W Contrast  03/08/2013  *RADIOLOGY REPORT*  Clinical Data: History of ileus.  Periumbilical pain.  Diarrhea.  CT ABDOMEN AND PELVIS WITH CONTRAST  Technique:  Multidetector CT imaging of the abdomen and pelvis was performed following the standard protocol during bolus administration of intravenous contrast.  Contrast: OMNIPAQUE IOHEXOL 300 MG/ML  SOLN  Comparison: CT of the abdomen and pelvis 10/13/2011.  Findings:  Lung Bases: Linear opacity in the left lower lobe is similar to the prior study compatible with scarring.  Emphysematous changes are noted in the lower lobes of the lungs bilaterally.  Abdomen/Pelvis:  Several small low to intermediate attenuation lesions are noted in the right lobe of the liver and appear similar to the prior examination from 10/13/2011.  These are favored to represent proteinaceous cysts.  The appearance of the gallbladder, pancreas, spleen and bilateral adrenal glands is unremarkable. There is a 1.7 cm low attenuation lesion in the lower pole of the left kidney, compatible with a simple cyst.  Multiple other subcentimeter low attenuation lesions are noted in the kidneys bilaterally, which are too small to definitively characterize, but are similar to the prior examination.  There is a trace amount of ascites in the central abdominal mesentery  and in the lower anatomic pelvis.  No pneumoperitoneum.  No pathologic distension of small bowel.  There is a suggestion of some long segment narrowing of the terminal ileum, best demonstrated on images 45-54 of series 2, and there is a small amount of adjacent mesenteric free fluid. Prostate gland is markedly enlarged measuring approximately 7.1 x 6.9 cm.  Urinary bladder is mildly thick-walled, but otherwise unremarkable in appearance.  Musculoskeletal: There are no aggressive appearing lytic or blastic lesions noted in the  visualized portions of the skeleton.  IMPRESSION: 1.  Findings are suspicious for long segment narrowing of the terminal ileum.  Given the adjacent small amount of free fluid in the mesentery tracking down in the pelvis, the possibility of inflammation from inflammatory bowel disease such as Crohn's disease is not excluded.  Clinical correlation is recommended. 2.  Nonspecific low intermediate attenuation liver lesions are similar to the prior examination but incompletely characterized on today's study.  These are favored to be benign, but could be definitively characterized with contrast-enhanced MRI of the liver if clinically indicated. 3.  Multiple renal lesions bilaterally, the largest of which in the left kidney is compatible with a simple cyst.  The smaller lesions are too small to definitively characterize, but are similar to prior examinations and are therefore favored to represent cysts. 4.  Marked enlargement of the prostate gland. 5.  Additional incidental findings, as above.   Original Report Authenticated By: Trudie Reed, M.D.    Ct Pelvis Limited W/o Cm  03/07/2013  *RADIOLOGY REPORT*  Clinical Data:  COPD, left upper lobe hilar mass  CT PELVIS WITHOUT CONTRAST  Technique:  Multidetector CT imaging of the pelvis was performed following the standard protocol without intravenous contrast.  Comparison:  03/06/2013, 10/13/2011  Findings:  Limited noncontrast CT imaging performed through the pelvic sacrum in preparation for presumed biopsy.  This was done with the patient prone.  Noncontrast imaging fails to demonstrate a sacral mass by CT.  No significant destructive osseous lesion.  No adenopathy.  Vascular calcifications noted.  Upon further chart review, CT sacral biopsy was ordered incorrectly on this patient Ananias, [place]ander. [birth year]). The patient with a sacral mass is Zaden, Sako.  Findings discussed with Dr.Azure Budnick  IMPRESSION: Limited pelvic CT negative for a sacral mass.  See  above comment. No biopsy performed.  The patient should be credited for this exam.   Original Report Authenticated By: Judie Petit. Miles Costain, M.D.    US Abdomen Limited Ruq  03/08/2013  *RADIOLOGY REPORT*  Clinical Data:  History of left-sided perihilar mass and left-sided infrahilar adenopathy worrisome for bronchogenic carcinoma with regional metastatic disease.  Recently performed chest CT demonstrates a several hypoattenuating lesions within the imaged right lobe of the liver.  LIMITED ABDOMINAL ULTRASOUND - RIGHT UPPER QUADRANT  Comparison:  Chest CT - 03/06/2013; CT abdomen pelvis - later same day; 10/13/2011; 08/08/2005  Findings:  Gallbladder:  Images provided of the gallbladder suggests the presence of multiple echogenic shadowing gallstones, however these were not visualized on subsequent CT abdomen and pelvis performed later the same day.  As such, provided images labeled "gallbladder" are favored to represent the adjacent hepatic flexure of the colon as opposed to the gallbladder.  Common bile duct:  Normal in size measuring 5.4 mm in diameter  Liver:  Normal hepatic contour.  There are multiple hyperechoic lesions scattered throughout the right lobe of the liver. Dominant hyperechoic lesion within the inferior subcapsular aspect of the right lobe the liver measures  2.2 x 1.6 x 1.9 cm (images 26-29). Additional dominant hyperechoic lesion within the posterior inferior aspect the right lobe of the liver measures approximately 1.9 x 1.9 x 1.3 cm (images 28 through 31).  There is an additional approximate 1.4 cm hyperechoic lesion in the subcapsular aspect the right lobe the liver (image 40).  IMPRESSION:  Multiple hyperechoic lesions within the right lobe of the liver, the largest of which measures approximately 2.2 cm in diameter.  As the ultrasound appearance of these lesions suggests benign hemangiomas and review of the chest CT performed 03/06/2013 suggests many of these lesions are stable since the CT of the  abdomen pelvis performed in 2006, ultrasound-guided biopsy was not performed at this time. If clinical concern persists for hepatic metastatic disease, further evaluation with either abdominal MRI or PET scan may be performed as indicated.  Above findings discussed with Dr. Isidoro Donning at the time of procedure cancellation.   Original Report Authenticated By: Tacey Ruiz, MD     ASSESSMENT / PLAN:  57 yo smoker admitted 3-23 with abd pain, N/V. Ct abd with Ct findings suspicious for metastatic disease, asdz lul ?post obstructive pna,rt paratracheal lymph node. T2N3  Plan:  Proceed with endobronchial [place] with needle aspiration of rt paratracheal Ln ( scheduled  in OR for 3/27 am) Will need PET scan as outpt & oncology FU Social work consult to help with financial issues I have explained the importance of FU to him   Hyponatremia - resolved, may be related to malignancy  Acute renal failure- dehydration , resolved  Ileus -  CT abdomen/ pelvis showed long segment narrowing of the terminal ileum.  Liver lesions- favor hemangiomas  - stable from priro films  ETOH abuse - w/f withdrawal  Care during the described time interval was provided by me and/or other providers on the critical care team.  I have reviewed this patient's available data, including medical history, events of note, physical examination and test results as part of my evaluation  Cyril Mourning MD. FCCP. [facility] Pager [phone] If no response call [phone]     03/09/2013, 8:53 AM

## 2013-03-10 ENCOUNTER — Encounter (HOSPITAL_COMMUNITY)
Admission: EM | Disposition: A | Discharge status: Home / Self Care | Payer: Self-pay | Source: Home / Self Care | Attending: Internal Medicine

## 2013-03-10 ENCOUNTER — Inpatient Hospital Stay (HOSPITAL_COMMUNITY): Payer: Medicaid Other | Admitting: Certified Registered"

## 2013-03-10 ENCOUNTER — Telehealth: Payer: Self-pay | Admitting: Oncology

## 2013-03-10 ENCOUNTER — Encounter (HOSPITAL_COMMUNITY): Payer: Self-pay | Admitting: Certified Registered"

## 2013-03-10 DIAGNOSIS — K7689 Other specified diseases of liver: Secondary | ICD-10-CM

## 2013-03-10 HISTORY — PX: VIDEO BRONCHOSCOPY WITH ENDOBRONCHIAL ULTRASOUND: SHX6177

## 2013-03-10 LAB — CBC
HCT: 30.9 % — ABNORMAL LOW (ref 39.0–52.0)
Hemoglobin: 10.2 g/dL — ABNORMAL LOW (ref 13.0–17.0)
RBC: 4.09 MIL/uL — ABNORMAL LOW (ref 4.22–5.81)

## 2013-03-10 LAB — SURGICAL PCR SCREEN
MRSA, PCR: NEGATIVE
Staphylococcus aureus: NEGATIVE

## 2013-03-10 LAB — GLUCOSE, CAPILLARY: Glucose-Capillary: 95 mg/dL (ref 70–99)

## 2013-03-10 LAB — BASIC METABOLIC PANEL
BUN: 15 mg/dL (ref 6–23)
CO2: 23 mEq/L (ref 19–32)
Chloride: 108 mEq/L (ref 96–112)
Glucose, Bld: 103 mg/dL — ABNORMAL HIGH (ref 70–99)
Potassium: 4 mEq/L (ref 3.5–5.1)
Sodium: 138 mEq/L (ref 135–145)

## 2013-03-10 SURGERY — BRONCHOSCOPY, WITH EBUS
Anesthesia: General | Laterality: Bilateral | Wound class: Clean Contaminated

## 2013-03-10 MED ORDER — LEVOFLOXACIN 750 MG PO TABS: 750.0000 mg | ORAL_TABLET | Freq: Every day | ORAL | Status: DC

## 2013-03-10 MED ORDER — EPHEDRINE SULFATE 50 MG/ML IJ SOLN
INTRAMUSCULAR | Status: DC | PRN
  Administered 2013-03-10 (×2): 10 mg via INTRAVENOUS

## 2013-03-10 MED ORDER — TAMSULOSIN HCL 0.4 MG PO CAPS: 0.4000 mg | ORAL_CAPSULE | Freq: Every day | ORAL | Status: DC

## 2013-03-10 MED ORDER — OXYCODONE HCL 5 MG PO TABS: 5.0000 mg | ORAL_TABLET | Freq: Four times a day (QID) | ORAL | Status: DC | PRN

## 2013-03-10 MED ORDER — PHENYLEPHRINE HCL 10 MG/ML IJ SOLN
INTRAMUSCULAR | Status: DC | PRN
  Administered 2013-03-10: 80 ug via INTRAVENOUS

## 2013-03-10 MED ORDER — GLYCOPYRROLATE 0.2 MG/ML IJ SOLN
INTRAMUSCULAR | Status: DC | PRN
  Administered 2013-03-10: 1 mg via INTRAVENOUS

## 2013-03-10 MED ORDER — ACETAMINOPHEN 10 MG/ML IV SOLN: 1000.0000 mg | Freq: Once | INTRAVENOUS | Status: DC | PRN

## 2013-03-10 MED ORDER — PROPOFOL 10 MG/ML IV BOLUS
INTRAVENOUS | Status: DC | PRN
  Administered 2013-03-10: 150 mg via INTRAVENOUS

## 2013-03-10 MED ORDER — HYDROMORPHONE HCL PF 1 MG/ML IJ SOLN: 0.2500 mg | INTRAMUSCULAR | Status: DC | PRN

## 2013-03-10 MED ORDER — ADULT MULTIVITAMIN W/MINERALS CH: 1.0000 | ORAL_TABLET | Freq: Every day | ORAL | Status: DC

## 2013-03-10 MED ORDER — LIDOCAINE HCL (CARDIAC) 20 MG/ML IV SOLN
INTRAVENOUS | Status: DC | PRN
  Administered 2013-03-10: 50 mg via INTRAVENOUS

## 2013-03-10 MED ORDER — PREDNISONE (PAK) 10 MG PO TABS: ORAL_TABLET | ORAL | Status: DC

## 2013-03-10 MED ORDER — ONDANSETRON HCL 4 MG/2ML IJ SOLN
INTRAMUSCULAR | Status: DC | PRN
  Administered 2013-03-10: 4 mg via INTRAVENOUS

## 2013-03-10 MED ORDER — 0.9 % SODIUM CHLORIDE (POUR BTL) OPTIME
TOPICAL | Status: DC | PRN
  Administered 2013-03-10: 1000 mL

## 2013-03-10 MED ORDER — FENTANYL CITRATE 0.05 MG/ML IJ SOLN
INTRAMUSCULAR | Status: DC | PRN
  Administered 2013-03-10: 100 ug via INTRAVENOUS
  Administered 2013-03-10 (×2): 50 ug via INTRAVENOUS

## 2013-03-10 MED ORDER — GLIMEPIRIDE 1 MG PO TABS: 1.0000 mg | ORAL_TABLET | Freq: Every day | ORAL | Status: DC

## 2013-03-10 MED ORDER — NEOSTIGMINE METHYLSULFATE 1 MG/ML IJ SOLN
INTRAMUSCULAR | Status: DC | PRN
  Administered 2013-03-10: 5 mg via INTRAVENOUS

## 2013-03-10 MED ORDER — ONDANSETRON HCL 4 MG/2ML IJ SOLN: 4.0000 mg | Freq: Once | INTRAMUSCULAR | Status: DC | PRN

## 2013-03-10 MED ORDER — LACTATED RINGERS IV SOLN
INTRAVENOUS | Status: DC | PRN
  Administered 2013-03-10 (×2): via INTRAVENOUS

## 2013-03-10 MED ORDER — ROCURONIUM BROMIDE 100 MG/10ML IV SOLN
INTRAVENOUS | Status: DC | PRN
  Administered 2013-03-10: 50 mg via INTRAVENOUS
  Administered 2013-03-10: 10 mg via INTRAVENOUS

## 2013-03-10 MED ORDER — MIDAZOLAM HCL 5 MG/5ML IJ SOLN
INTRAMUSCULAR | Status: DC | PRN
  Administered 2013-03-10: 2 mg via INTRAVENOUS

## 2013-03-10 MED ORDER — LEVOFLOXACIN 750 MG PO TABS
750.0000 mg | ORAL_TABLET | Freq: Every day | ORAL | Status: DC
  Administered 2013-03-10: 750 mg via ORAL
  Filled 2013-03-10: qty 1

## 2013-03-10 SURGICAL SUPPLY — 25 items
BRUSH CYTOL CELLEBRITY 1.5X140 (MISCELLANEOUS) IMPLANT
CANISTER SUCTION 2500CC (MISCELLANEOUS) ×2 IMPLANT
CLOTH BEACON ORANGE TIMEOUT ST (SAFETY) ×2 IMPLANT
CONT SPEC 4OZ CLIKSEAL STRL BL (MISCELLANEOUS) ×2 IMPLANT
COVER TABLE BACK 60X90 (DRAPES) ×2 IMPLANT
FORCEPS BIOP RJ4 1.8 (CUTTING FORCEPS) IMPLANT
GLOVE BIO SURGEON STRL SZ 6 (GLOVE) ×2 IMPLANT
GLOVE BIO SURGEON STRL SZ 6.5 (GLOVE) ×2 IMPLANT
GLOVE BIOGEL PI IND STRL 6 (GLOVE) ×1 IMPLANT
GLOVE BIOGEL PI INDICATOR 6 (GLOVE) ×1
GLOVE SURG SIGNA 7.5 PF LTX (GLOVE) ×2 IMPLANT
KIT ROOM TURNOVER OR (KITS) ×2 IMPLANT
MARKER SKIN DUAL TIP RULER LAB (MISCELLANEOUS) ×2 IMPLANT
NEEDLE BIOPSY TRANSBRONCH 21G (NEEDLE) IMPLANT
NEEDLE SYS SONOTIP II EBUSTBNA (NEEDLE) ×2 IMPLANT
NS IRRIG 1000ML POUR BTL (IV SOLUTION) ×2 IMPLANT
OIL SILICONE PENTAX (PARTS (SERVICE/REPAIRS)) IMPLANT
PAD ARMBOARD 7.5X6 YLW CONV (MISCELLANEOUS) ×4 IMPLANT
SPONGE GAUZE 4X4 12PLY (GAUZE/BANDAGES/DRESSINGS) IMPLANT
SYR 20CC LL (SYRINGE) ×2 IMPLANT
SYR 20ML ECCENTRIC (SYRINGE) ×4 IMPLANT
SYR 5ML LUER SLIP (SYRINGE) ×2 IMPLANT
TOWEL OR 17X24 6PK STRL BLUE (TOWEL DISPOSABLE) ×2 IMPLANT
TRAP SPECIMEN MUCOUS 40CC (MISCELLANEOUS) ×2 IMPLANT
TUBE CONNECTING 12X1/4 (SUCTIONS) ×2 IMPLANT

## 2013-03-10 NOTE — H&P (View-Only) (Signed)
PULMONARY  / CRITICAL CARE MEDICINE  Name: Rodney Powers MRN: [id number] DOB: 10/12/1956    ADMISSION DATE:  03/06/2013 CONSULTATION DATE:  3-24  REFERRING MD :  triad  CHIEF COMPLAINT: Abd pain  BRIEF PATIENT DESCRIPTION:  56 yo unemployed [name] with 1 PPD tobacco use since age 15 who presented on 3-23 with CC: abd pain, N/v x 4 days prior to admit. + for chills sweats and non productive cough. He can normally ambulate 1/2 mile without SOB. No complaints of PND, orthopnea    CULTURES: 3/23 bc x 2>>ng 3-23 sputum>>   ANTIBIOTICS: 3-23 zithro>> 3-23 Roc>>  HISTORY OF PRESENT ILLNESS:   56 yo unemployed [name] with 1 PPD tobacco use since age 15 who presented on 3-23 with CC: abd pain, N/v x 4 days prior to admit. + for chills sweats and non productive cough. He can normally ambulate 1/2 mile without SOB. No complaints of PND, orthopnea. He is reported to consume etoh on regular bases last 4 days prior to admit.  CT of abd revealed lung lesions/post obstuctive changes and CT chest was concerning for metastatic dz. PCCM asked to evaluate for possible FOB.    VITAL SIGNS: Temp:  [96.6 F (35.9 C)-97.5 F (36.4 C)] 97.3 F (36.3 C) (03/26 0607) Pulse Rate:  [61-83] 66 (03/26 0607) Resp:  [14-20] 18 (03/26 0607) BP: (132-156)/(72-90) 132/80 mmHg (03/26 0607) SpO2:  [97 %-100 %] 98 % (03/26 0607)  PHYSICAL EXAMINATION: General: WNWDAAM with dull affect Neuro:  Dull affect but intact HEENT:  Poor dentation with multiple missing teeth. No caries noted Neck:  No LAN Cardiovascular:  HSR RRR Lungs:  Decrease bs bases.No increase wob at rest Abdomen:  +bs , obese, tender Musculoskeletal:  intact Skin:  Warm, no edema   Recent Labs Lab 03/07/13 0511 03/07/13 0910 03/08/13 0630  NA 136 135 136  K 4.5 4.3 4.5  CL 103 103 105  CO2 23 21 18*  BUN 10 11 16  CREATININE 0.93 0.81 0.82  GLUCOSE 180* 159* 169*    Recent Labs Lab 03/06/13 1009 03/06/13 1759 03/07/13 0511   HGB 11.9* 10.1* 11.1*  HCT 34.7* 29.9* 33.5*  WBC 15.3* 13.7* 13.0*  PLT 476* 387 442*   Ct Abdomen Pelvis W Contrast  03/08/2013  *RADIOLOGY REPORT*  Clinical Data: History of ileus.  Periumbilical pain.  Diarrhea.  CT ABDOMEN AND PELVIS WITH CONTRAST  Technique:  Multidetector CT imaging of the abdomen and pelvis was performed following the standard protocol during bolus administration of intravenous contrast.  Contrast: 100mL OMNIPAQUE IOHEXOL 300 MG/ML  SOLN  Comparison: CT of the abdomen and pelvis 10/13/2011.  Findings:  Lung Bases: Linear opacity in the left lower lobe is similar to the prior study compatible with scarring.  Emphysematous changes are noted in the lower lobes of the lungs bilaterally.  Abdomen/Pelvis:  Several small low to intermediate attenuation lesions are noted in the right lobe of the liver and appear similar to the prior examination from 10/13/2011.  These are favored to represent proteinaceous cysts.  The appearance of the gallbladder, pancreas, spleen and bilateral adrenal glands is unremarkable. There is a 1.7 cm low attenuation lesion in the lower pole of the left kidney, compatible with a simple cyst.  Multiple other subcentimeter low attenuation lesions are noted in the kidneys bilaterally, which are too small to definitively characterize, but are similar to the prior examination.  There is a trace amount of ascites in the central abdominal mesentery   and in the lower anatomic pelvis.  No pneumoperitoneum.  No pathologic distension of small bowel.  There is a suggestion of some long segment narrowing of the terminal ileum, best demonstrated on images 45-54 of series 2, and there is a small amount of adjacent mesenteric free fluid. Prostate gland is markedly enlarged measuring approximately 7.1 x 6.9 cm.  Urinary bladder is mildly thick-walled, but otherwise unremarkable in appearance.  Musculoskeletal: There are no aggressive appearing lytic or blastic lesions noted in the  visualized portions of the skeleton.  IMPRESSION: 1.  Findings are suspicious for long segment narrowing of the terminal ileum.  Given the adjacent small amount of free fluid in the mesentery tracking down in the pelvis, the possibility of inflammation from inflammatory bowel disease such as Crohn's disease is not excluded.  Clinical correlation is recommended. 2.  Nonspecific low intermediate attenuation liver lesions are similar to the prior examination but incompletely characterized on today's study.  These are favored to be benign, but could be definitively characterized with contrast-enhanced MRI of the liver if clinically indicated. 3.  Multiple renal lesions bilaterally, the largest of which in the left kidney is compatible with a simple cyst.  The smaller lesions are too small to definitively characterize, but are similar to prior examinations and are therefore favored to represent cysts. 4.  Marked enlargement of the prostate gland. 5.  Additional incidental findings, as above.   Original Report Authenticated By: Daniel Entrikin, M.D.    Ct Pelvis Limited W/o Cm  03/07/2013  *RADIOLOGY REPORT*  Clinical Data:  COPD, left upper lobe hilar mass  CT PELVIS WITHOUT CONTRAST  Technique:  Multidetector CT imaging of the pelvis was performed following the standard protocol without intravenous contrast.  Comparison:  03/06/2013, 10/13/2011  Findings:  Limited noncontrast CT imaging performed through the pelvic sacrum in preparation for presumed biopsy.  This was done with the patient prone.  Noncontrast imaging fails to demonstrate a sacral mass by CT.  No significant destructive osseous lesion.  No adenopathy.  Vascular calcifications noted.  Upon further chart review, CT sacral biopsy was ordered incorrectly on this patient (Dib, Tryce R. 06/09/1956). The patient with a sacral mass is Nydam, Muhammed F.  Findings discussed with Dr.Alva  IMPRESSION: Limited pelvic CT negative for a sacral mass.  See  above comment. No biopsy performed.  The patient should be credited for this exam.   Original Report Authenticated By: M. Shick, M.D.    Us Abdomen Limited Ruq  03/08/2013  *RADIOLOGY REPORT*  Clinical Data:  History of left-sided perihilar mass and left-sided infrahilar adenopathy worrisome for bronchogenic carcinoma with regional metastatic disease.  Recently performed chest CT demonstrates a several hypoattenuating lesions within the imaged right lobe of the liver.  LIMITED ABDOMINAL ULTRASOUND - RIGHT UPPER QUADRANT  Comparison:  Chest CT - 03/06/2013; CT abdomen pelvis - later same day; 10/13/2011; 08/08/2005  Findings:  Gallbladder:  Images provided of the gallbladder suggests the presence of multiple echogenic shadowing gallstones, however these were not visualized on subsequent CT abdomen and pelvis performed later the same day.  As such, provided images labeled "gallbladder" are favored to represent the adjacent hepatic flexure of the colon as opposed to the gallbladder.  Common bile duct:  Normal in size measuring 5.4 mm in diameter  Liver:  Normal hepatic contour.  There are multiple hyperechoic lesions scattered throughout the right lobe of the liver. Dominant hyperechoic lesion within the inferior subcapsular aspect of the right lobe the liver measures   2.2 x 1.6 x 1.9 cm (images 26-29). Additional dominant hyperechoic lesion within the posterior inferior aspect the right lobe of the liver measures approximately 1.9 x 1.9 x 1.3 cm (images 28 through 31).  There is an additional approximate 1.4 cm hyperechoic lesion in the subcapsular aspect the right lobe the liver (image 40).  IMPRESSION:  Multiple hyperechoic lesions within the right lobe of the liver, the largest of which measures approximately 2.2 cm in diameter.  As the ultrasound appearance of these lesions suggests benign hemangiomas and review of the chest CT performed 03/06/2013 suggests many of these lesions are stable since the CT of the  abdomen pelvis performed in 2006, ultrasound-guided biopsy was not performed at this time. If clinical concern persists for hepatic metastatic disease, further evaluation with either abdominal MRI or PET scan may be performed as indicated.  Above findings discussed with Dr. Rai at the time of procedure cancellation.   Original Report Authenticated By: John Watts V, MD     ASSESSMENT / PLAN:  56 yo smoker admitted 3-23 with abd pain, N/V. Ct abd with Ct findings suspicious for metastatic disease, asdz lul ?post obstructive pna,rt paratracheal lymph node. T2N3  Plan:  Proceed with endobronchial US with needle aspiration of rt paratracheal Ln ( scheduled  in OR for 3/27 am) Will need PET scan as outpt & oncology FU Social work consult to help with financial issues I have explained the importance of FU to him   Hyponatremia - resolved, may be related to malignancy  Acute renal failure- dehydration , resolved  Ileus -  CT abdomen/ pelvis showed long segment narrowing of the terminal ileum.  Liver lesions- favor hemangiomas  - stable from priro films  ETOH abuse - w/f withdrawal  Care during the described time interval was provided by me and/or other providers on the critical care team.  I have reviewed this patient's available data, including medical history, events of note, physical examination and test results as part of my evaluation  Rakesh Alva MD. FCCP. [facility] Pager [phone] If no response call [phone]     03/09/2013, 8:53 AM   

## 2013-03-10 NOTE — Anesthesia Procedure Notes (Signed)
Procedure Name: Intubation Date/Time: 03/10/2013 7:36 AM Performed by: Carmela Rima Pre-anesthesia Checklist: Patient identified, Timeout performed, Emergency Drugs available, Suction available and Patient being monitored Patient Re-evaluated:Patient Re-evaluated prior to inductionOxygen Delivery Method: Circle system utilized Preoxygenation: Pre-oxygenation with 100% oxygen Intubation Type: IV induction Ventilation: Mask ventilation without difficulty Laryngoscope Size: Mac and 4 Grade View: Grade II Tube size: 9.0 mm Number of attempts: 1 Placement Confirmation: ETT inserted through vocal cords under direct vision,  positive ETCO2 and breath sounds checked- equal and bilateral Secured at: 23 cm Tube secured with: Tape Dental Injury: Teeth and Oropharynx as per pre-operative assessment

## 2013-03-10 NOTE — Anesthesia Postprocedure Evaluation (Signed)
  Anesthesia Post-op Note  Patient: Rodney Powers  Procedure(s) Performed: Procedure(s): VIDEO BRONCHOSCOPY WITH ENDOBRONCHIAL ULTRASOUND (Bilateral)  Patient Location: PACU  Anesthesia Type:General  Level of Consciousness: awake, alert  and oriented  Airway and Oxygen Therapy: Patient Spontanous Breathing and Patient connected to nasal cannula oxygen  Post-op Pain: none  Post-op Assessment: Post-op Vital signs reviewed, Patient's Cardiovascular Status Stable, Respiratory Function Stable, Patent Airway and Pain level controlled  Post-op Vital Signs: stable  Complications: No apparent anesthesia complications

## 2013-03-10 NOTE — Progress Notes (Signed)
PULMONARY  / CRITICAL CARE MEDICINE  Name: Rodney Powers MRN: [id number] DOB: August 27, 1956    ADMISSION DATE:  03/06/2013 CONSULTATION DATE:  3-24  REFERRING MD :  triad  CHIEF COMPLAINT: Abd pain  BRIEF PATIENT DESCRIPTION:  57 yo unemployed [name] with 1 PPD tobacco use since age 6 who presented on 3-23 with CC: abd pain, N/v x 4 days prior to admit. + for chills sweats and non productive cough. He can normally ambulate 1/2 mile without SOB. No complaints of PND, orthopnea    CULTURES: 3/23 bc x 2>>ng    ANTIBIOTICS: 3-23 zithro>> 3-23 Roc>>  HISTORY OF PRESENT ILLNESS:   57 yo unemployed [name] with 1 PPD tobacco use since age 69 who presented on 3-23 with CC: abd pain, N/v x 4 days prior to admit. + for chills sweats and non productive cough. He can normally ambulate 1/2 mile without SOB. No complaints of PND, orthopnea. He is reported to consume etoh on regular bases last 4 days prior to admit.  CT of abd revealed lung lesions/post obstuctive changes and CT chest was concerning for metastatic dz. PCCM asked to evaluate for possible FOB.    VITAL SIGNS: Temp:  [97.3 F (36.3 C)-97.9 F (36.6 C)] 97.3 F (36.3 C) (03/27 0939) Pulse Rate:  [66-85] 75 (03/27 0939) Resp:  [17-24] 17 (03/27 0939) BP: (125-152)/(64-85) 143/83 mmHg (03/27 0930) SpO2:  [93 %-99 %] 96 % (03/27 0939) Weight:  [99.565 kg (219 lb 8 oz)] 99.565 kg (219 lb 8 oz) (03/27 0536)  PHYSICAL EXAMINATION: General: WNWDAAM with dull affect Neuro:  Dull affect but intact HEENT:  Poor dentation with multiple missing teeth. No caries noted Neck:  No LAN Cardiovascular:  HSR RRR Lungs:  Decrease bs bases.No increase wob at rest Abdomen:  +bs , obese, tender Musculoskeletal:  intact Skin:  Warm, no edema   Recent Labs Lab 03/07/13 0910 03/08/13 0630 03/10/13 0430  NA 135 136 138  K 4.3 4.5 4.0  CL 103 105 108  CO2 21 18* 23  BUN 11 16 15   CREATININE 0.81 0.82 0.97  GLUCOSE 159* 169* 103*     Recent Labs Lab 03/06/13 1759 03/07/13 0511 03/10/13 0430  HGB 10.1* 11.1* 10.2*  HCT 29.9* 33.5* 30.9*  WBC 13.7* 13.0* 12.1*  PLT 387 442* 511*   Ct Abdomen Pelvis W Contrast  03/08/2013  *RADIOLOGY REPORT*  Clinical Data: History of ileus.  Periumbilical pain.  Diarrhea.  CT ABDOMEN AND PELVIS WITH CONTRAST  Technique:  Multidetector CT imaging of the abdomen and pelvis was performed following the standard protocol during bolus administration of intravenous contrast.  Contrast: OMNIPAQUE IOHEXOL 300 MG/ML  SOLN  Comparison: CT of the abdomen and pelvis 10/13/2011.  Findings:  Lung Bases: Linear opacity in the left lower lobe is similar to the prior study compatible with scarring.  Emphysematous changes are noted in the lower lobes of the lungs bilaterally.  Abdomen/Pelvis:  Several small low to intermediate attenuation lesions are noted in the right lobe of the liver and appear similar to the prior examination from 10/13/2011.  These are favored to represent proteinaceous cysts.  The appearance of the gallbladder, pancreas, spleen and bilateral adrenal glands is unremarkable. There is a 1.7 cm low attenuation lesion in the lower pole of the left kidney, compatible with a simple cyst.  Multiple other subcentimeter low attenuation lesions are noted in the kidneys bilaterally, which are too small to definitively characterize, but are similar to the  prior examination.  There is a trace amount of ascites in the central abdominal mesentery and in the lower anatomic pelvis.  No pneumoperitoneum.  No pathologic distension of small bowel.  There is a suggestion of some long segment narrowing of the terminal ileum, best demonstrated on images 45-54 of series 2, and there is a small amount of adjacent mesenteric free fluid. Prostate gland is markedly enlarged measuring approximately 7.1 x 6.9 cm.  Urinary bladder is mildly thick-walled, but otherwise unremarkable in appearance.  Musculoskeletal: There  are no aggressive appearing lytic or blastic lesions noted in the visualized portions of the skeleton.  IMPRESSION: 1.  Findings are suspicious for long segment narrowing of the terminal ileum.  Given the adjacent small amount of free fluid in the mesentery tracking down in the pelvis, the possibility of inflammation from inflammatory bowel disease such as Crohn's disease is not excluded.  Clinical correlation is recommended. 2.  Nonspecific low intermediate attenuation liver lesions are similar to the prior examination but incompletely characterized on today's study.  These are favored to be benign, but could be definitively characterized with contrast-enhanced MRI of the liver if clinically indicated. 3.  Multiple renal lesions bilaterally, the largest of which in the left kidney is compatible with a simple cyst.  The smaller lesions are too small to definitively characterize, but are similar to prior examinations and are therefore favored to represent cysts. 4.  Marked enlargement of the prostate gland. 5.  Additional incidental findings, as above.   Original Report Authenticated By: Trudie Reed, M.D.     ASSESSMENT / PLAN:  58 yo smoker admitted 3-23 with abd pain, N/V. Ct abd with Ct findings suspicious for metastatic disease, asdz lul ?post obstructive pna,rt paratracheal lymph node. T2N3  Plan:  ENndobronchial [place] with needle aspiration of 4L & 10L Lns performed - prelim cytology negative but willawait final results  ( scheduled  in OR for 3/27 am) Pl arrange PET scan as outpt  Social work consult to help with financial issues I have explained the importance of FU to him   Hyponatremia - resolved  Acute renal failure- dehydration , resolved  Ileus -  CT abdomen/ pelvis showed long segment narrowing of the terminal ileum.  Liver lesions- favor hemangiomas  - stable from priro films  ETOH abuse - counselled  I have arranged Fu with me, he can be discharged today if stable   Cyril Mourning MD. FCCP. [facility] Pager [phone] If no response call [phone]     03/10/2013, 11:04 AM

## 2013-03-10 NOTE — Preoperative (Signed)
Beta Blockers   Reason not to administer Beta Blockers:Not Applicable 

## 2013-03-10 NOTE — Telephone Encounter (Signed)
S/W PT IN RE TO NP APPT 03/31 @ 1:30 W/DR. HA WELCOME PACKET MAILED.

## 2013-03-10 NOTE — Progress Notes (Signed)
Pt has a pending Medicaid application submitted on 03/09/2013.  I have contacted Financial Counselor office to speak with pt prior to discharging.  Medicaid can take from 45 days to several months to get approved.  There is no other coverage that can assist the patient with expensive testing for his diagnoses.  The financial counselor said that she would speak with the patient prior to his discharge today.

## 2013-03-10 NOTE — Op Note (Signed)
Indication : Mediastinal  Lymphadenopathy & LUL mass in this 57 y.o heavy smoker with suspicion of primary lung malignancy   Written informed consent was obtained prior to the procedure. The risks of the procedure including coughing, bleeding and the small chance of lung puncture requiring chest tube were discussed in great detail. The benefits & alternatives including serial follow up were also discussed.  The patient was identified in the holding area, interim history obtained & brought to the OR Procedure was performed under general anesthesia. Bronchoscope entered from the ETT. Trachea & bronchial tree examined to the subsegmental level. Narrowing of LUL apicoposterior segment bronchus was noted without any discrete endobronchial lesions. Minimal white secretions were suctioned out. The bronchoscope was then withdrawn & EBUS scope was introduced. Lymphadenopathy was identified at 10L, 4L stations & 3-4 passes were obtained from each area with aspirated specimens sent for cytology. Endobronchial brushings & washings were also obtained from the  LUL bronchi. Procedure was uneventful with minimal blood loss He was recovered by anaesthesia  Cyril Mourning MD. FCCP. [facility] Pager [phone] If no response call [phone]

## 2013-03-10 NOTE — Clinical Social Work Note (Signed)
Clinical Child psychotherapist received inappropriate referral for "No insurance, has lung mass , needs outpt pET scan, biopsy procedure & oncology consult & ongoing care." Per chart, patient is "medicaid pending." CSW will sign off, as social work intervention is no longer needed.   Rozetta Nunnery MSW, [employer] [phone]

## 2013-03-10 NOTE — Discharge Summary (Signed)
Physician Discharge Summary  Patient ID: Rodney Powers MRN: [id number] DOB/AGE: 08/12/1956 57 y.o.  Admit date: 03/06/2013 Discharge date: 03/10/2013  Primary Care Physician:  Default, Provider, MD  Discharge Diagnoses:    . Postobstructive PNA (pneumonia)/ new 6.5x 5.5cm lung mass  . Hyponatremia . AKI (acute kidney injury)- resolved . Protein-calorie malnutrition, moderate . Hyperglycemia- likely secondary to borderline diabetes, exacerbated by steroids . Leukocytosis . ETOH abuse . (Resolved) Nausea and vomiting likely ileus .  Liver hemangiomas   Consults: pulmonology, Dr Vassie Loll                   Interventional radiology   Discharge Instructions:   Discharge Medications:   Medication List    TAKE these medications       glimepiride 1 MG tablet  Commonly known as:  AMARYL  Take 1 tablet (1 mg total) by mouth daily before breakfast.     levofloxacin 750 MG tablet  Commonly known as:  LEVAQUIN  Take 1 tablet (750 mg total) by mouth daily. X 7 DAYS     multivitamin with minerals Tabs  Take 1 tablet by mouth daily.     oxyCODONE 5 MG immediate release tablet  Commonly known as:  Oxy IR/ROXICODONE  Take 1 tablet (5 mg total) by mouth every 6 (six) hours as needed for pain.     predniSONE 10 MG tablet  Commonly known as:  STERAPRED UNI-PAK  Prednisone dosing: Take prednsone 30mg  (3 tabs) x 3 days, then 20mg  (2 tabs) x 3days, then 10mg  (1 tab) x 3days, then OFF.    Dispense:  18 tabs, refills: None     tamsulosin 0.4 MG Caps  Commonly known as:  FLOMAX  Take 1 capsule (0.4 mg total) by mouth daily after supper.         Brief H and P: For complete details please refer to admission H and P, but in brief the patient had presented with nausea and vomiting 4 days prior to admission progressively getting worse. He also reported persistent cough for the past 2 weeks and malaise. In the emergency room his temperature was checked which was 100.7, chest x-ray was  done that showed a hilar mass a CT scan was then done that showed a lung mass with multiple lymph nodules. A lipase was checked which was within normal limits. He denied any abdominal pain. Blood glucose is 250 he is hyponatremic and hypochloremic with leukocytosis of 15. He was started empirically on vancomycin Zosyn  Hospital Course:   PNA likely postobstructive/ NEW 5x6cm mass left perihilar mass /Leukocytosis - CT chest findings was suspicious for metastatic disease, post obstructive PNA  Interventional radiology and pulmonology were consulted and patient was placed on IV antibiotics. Per IR, Dr Grace Isaac, the liver lesions on CT chest appears most likely as hemangiomas and have been stable compared to previous imaging. Limited CT pelvis was done which was negative for any sacral mass. CT of the abdomen and pelvis showed long segment narrowing of the terminal ileum, possible ileus but low attenuation liver lesions appear to be similar from the prior examination. Patient underwent endobronchial [place] and needle aspiration of right paratracheal lymph node.   I have arranged oncology appointment on 03/14/13 with Dr Shirline Frees at 1:30pm and outpatient PET scan on 03/15/13. His pulmonology follow up is arranged on 03/24/13.    Hyponatremia:resolved, likely sec to decreased intravascular volume due to nausea vomiting at the time of admission AKI (acute kidney injury): resolved, likely  secondary to nausea and vomiting  Protein-calorie malnutrition, moderate: albumin is 2.6 possibly sec to malignancy, Etoh  Alcohol abuse: stable, no withdrawal sx  Hyperglycemia: most likely secondary to his infectious process, on steroids with underlying new diagnosis borderline DM. Patient was placed on Amaryl for out-patient. hemoglobin A1c 7.6. Requested case management to provide patient information about obtaining PCP prior to DC. BPH: placed on flomax  Day of Discharge BP 134/75  Pulse 77  Temp(Src) 98 F (36.7 C) (Oral)   Resp 16  Ht 6\' 3"  (1.905 m)  Wt 99.565 kg (219 lb 8 oz)  BMI 27.44 kg/m2  SpO2 96%  Physical Exam: General: Alert and awake oriented x3 not in any acute distress. CVS: S1-S2 clear no murmur rubs or gallops Chest: clear to auscultation bilaterally, no wheezing rales or rhonchi Abdomen: soft nontender, nondistended, normal bowel sounds Extremities: no cyanosis, clubbing or edema noted bilaterally    The results of significant diagnostics from this hospitalization (including imaging, microbiology, ancillary and laboratory) are listed below for reference.    LAB RESULTS: Basic Metabolic Panel:  Recent Labs Lab 03/08/13 0630 03/10/13 0430  NA 136 138  K 4.5 4.0  CL 105 108  CO2 18* 23  GLUCOSE 169* 103*  BUN 16 15  CREATININE 0.82 0.97  CALCIUM 9.0 8.4   Liver Function Tests:  Recent Labs Lab 03/06/13 1009  AST 46*  ALT 51  ALKPHOS 99  BILITOT 0.6  PROT 8.1  ALBUMIN 2.6*    Recent Labs Lab 03/06/13 1009  LIPASE 32   No results found for this basename: AMMONIA,  in the last 168 hours CBC:  Recent Labs Lab 03/06/13 1009  03/07/13 0511 03/10/13 0430  WBC 15.3*  < > 13.0* 12.1*  NEUTROABS 12.8*  --   --   --   HGB 11.9*  < > 11.1* 10.2*  HCT 34.7*  < > 33.5* 30.9*  MCV 75.3*  < > 75.3* 75.6*  PLT 476*  < > 442* 511*  < > = values in this interval not displayed. Cardiac Enzymes: No results found for this basename: CKTOTAL, CKMB, CKMBINDEX, TROPONINI,  in the last 168 hours BNP: No components found with this basename: POCBNP,  CBG:  Recent Labs Lab 03/10/13 0907 03/10/13 1205  GLUCAP 107* 168*    Significant Diagnostic Studies:  Ct Chest W Contrast  03/06/2013  *RADIOLOGY REPORT*  Clinical Data: Mass noted on x-ray  CT CHEST WITH CONTRAST  Technique:  Multidetector CT imaging of the chest was performed following the standard protocol during bolus administration of intravenous contrast.  Contrast: 80mL OMNIPAQUE IOHEXOL 300 MG/ML  SOLN  Comparison:  Chest x-ray same day  Findings: Sagittal images of the spine are unremarkable.  The images of the thoracic inlet are unremarkable.  Central airways are patent.  As noted on chest x-rays there is a nodular mass or confluent adenopathy in the left mediastinum extending in the left upper lobe suprahilar region.  There is low density centrally suspicious for necrosis the lesion measures at least 6.5 x 5.5 cm highly suspicious for malignancy.  Correlation with bronchoscopy and/or biopsy is recommended.  A AP window lymph node measures 1.4 x 1.5 cm.  Right pretracheal lymph node measures 1.6 x 1.3 cm.  Left hilar lymph node measures 2 x 1.9 cm.  No right hilar adenopathy is noted.  Images of the lung parenchyma shows bilateral interstitial prominence.  There is interstitial thickening with patchy airspace disease in the left upper  lobe anteriorly suspicious for postobstructive pneumonia.  Large paraseptal emphysematous bullae are noted in the upper lobes bilaterally.  Small hiatal hernia.  There is a low density lesion in the right hepatic lobe laterally measures 1.7 cm.  Second low density lesion in the right hepatic lobe measures 2.2 cm.  These are highly suspicious for metastatic disease.  Further evaluation is recommended.  No adrenal gland mass is noted.  Bilateral nonspecific axillary lymph nodes are noted.  IMPRESSION:  1.  There is a mass or confluent adenopathy in the left mediastinum//suprahilar region measures 6.5 x 5.5 cm.  This is highly suspicious for malignancy.  Further evaluation with bronchoscopy and/or biopsy is recommended. 2.  There are pathologic mediastinal and left hilar lymph nodes. 3.  Airspace disease in the left upper lobe anteriorly suspicious for postobstructive pneumonia. 4.  Extensive  emphysematous changes bilateral upper lobe. 5.  Low density lesions are noted in the right hepatic lobe suspicious for metastatic disease.  Further evaluation is recommended.  Critical findings discussed with  Dr. Ethelda Chick   Original Report Authenticated By: Natasha Mead, M.D.    Ct Pelvis Limited W/o Cm  03/07/2013  *RADIOLOGY REPORT*  Clinical Data:  COPD, left upper lobe hilar mass  CT PELVIS WITHOUT CONTRAST  Technique:  Multidetector CT imaging of the pelvis was performed following the standard protocol without intravenous contrast.  Comparison:  03/06/2013, 10/13/2011  Findings:  Limited noncontrast CT imaging performed through the pelvic sacrum in preparation for presumed biopsy.  This was done with the patient prone.  Noncontrast imaging fails to demonstrate a sacral mass by CT.  No significant destructive osseous lesion.  No adenopathy.  Vascular calcifications noted.  Upon further chart review, CT sacral biopsy was ordered incorrectly on this patient Ved, Martos. 05-25-1956). The patient with a sacral mass is Jahzier, Villalon.  Findings discussed with Dr.Alva  IMPRESSION: Limited pelvic CT negative for a sacral mass.  See above comment. No biopsy performed.  The patient should be credited for this exam.   Original Report Authenticated By: Judie Petit. Shick, M.D.    Dg Abd Acute W/chest  03/06/2013  *RADIOLOGY REPORT*  Clinical Data: Abdominal pain, emesis.  ACUTE ABDOMEN SERIES (ABDOMEN 2 VIEW & CHEST 1 VIEW)  Comparison: 10/13/2011  Findings: Poorly marginated left hilar mass-like opacity.  Streaky left perihilar interstitial and alveolar opacities.  Stable prominent right perihilar interstitial markings.  No effusion. Heart size normal.  No free air. Small bowel and colon are nondilated.  Scattered fluid levels on the erect radiograph.  IMPRESSION:  1.  Left perihilar mass-like opacity with adjacent peripheral disease suggesting obstructing mass or pneumonia.  CT chest with contrast may be useful for further characterization. 2.  Nonobstructive bowel gas pattern with scattered fluid levels, suggesting possible early ileus.   Original Report Authenticated By: D. Andria Rhein, MD       Disposition  and Follow-up:      Future Appointments Provider Department Dept Phone   03/14/2013 1:30 PM [facility] MEDICAL ONCOLOGY [phone]   03/14/2013 1:45 PM [facility] MEDICAL ONCOLOGY [phone]   03/14/2013 2:00 PM Exie Parody, MD [facility] MEDICAL ONCOLOGY [phone]   03/15/2013 1:30 PM [facility] [phone]   Pt should arrive15 minutes prior to scheduled appt time. Please inform patient that exam will take a minimum of 1 1/2 hours. Patient to be NPO 6 hours  prior to exam  and should not take any insulin the day of exam.   03/24/2013 1:30 PM Oretha Milch, MD [facility] [phone]       DISPOSITION: home DIET: carb modified diet ACTIVITY: as tolerated   DISCHARGE FOLLOW-UP Follow-up Information   Follow up with Oretha Milch., MD On 03/24/2013. (1.30 pm)    Contact information:   [address] [place] [address] [phone]       Follow up with Lajuana Matte., MD On 03/14/2013. (at 1:30 PM)    Contact information:   [address] [place] [address] [phone]       Follow up with [facility] On 03/15/2013. (at 1:15PM for Nuclear medicine PET scan. please see instructions below )    Contact information:   [address] [place] [address] [phone]      Time spent on Discharge: 45 mins Signed:   Shatiqua Heroux M.D. [facility] 03/10/2013, 1:41 PM Pager: [phone]

## 2013-03-10 NOTE — Anesthesia Preprocedure Evaluation (Addendum)
Anesthesia Evaluation  Patient identified by MRN, date of birth, ID band Patient awake    Reviewed: Allergy & Precautions, H&P , NPO status , Patient's Chart, lab work & pertinent test results  Airway Mallampati: II TM Distance: >3 FB Neck ROM: full    Dental  (+) Poor Dentition, Dental Advidsory Given and Dental Advisory Given   Pulmonary pneumonia -, COPD + rhonchi         Cardiovascular Rhythm:Regular Rate:Normal     Neuro/Psych    GI/Hepatic   Endo/Other  diabetes, Poorly Controlled  Renal/GU      Musculoskeletal   Abdominal   Peds  Hematology   Anesthesia Other Findings Newly diagnosed DM  Reproductive/Obstetrics                          Anesthesia Physical Anesthesia Plan  ASA: III  Anesthesia Plan: General   Post-op Pain Management:    Induction: Intravenous  Airway Management Planned: Oral ETT  Additional Equipment:   Intra-op Plan:   Post-operative Plan:   Informed Consent: I have reviewed the patients History and Physical, chart, labs and discussed the procedure including the risks, benefits and alternatives for the proposed anesthesia with the patient or authorized representative who has indicated his/her understanding and acceptance.   Dental Advisory Given and Dental advisory given  Plan Discussed with: Anesthesiologist, CRNA and Surgeon  Anesthesia Plan Comments: (bilat lung masses Smoker ETOH abuse  Plan GA with oral; ETT  Kipp Brood, MD)       Anesthesia Quick Evaluation

## 2013-03-10 NOTE — Interval H&P Note (Signed)
Rodney Powers has presented today for procedure, with the diagnosis of LUL mass with mediastinal lymphadenopathy. The various methods of treatment have been discussed with the patient and family. After consideration of risks, benefits and other options for treatment, the patient has consented to Procedure(s) :  Bronchoscopy under GA with biopsies .  The patient's history has been reviewed, patient examined, no change in status, stable for surgery. I have reviewed the patient's chart and labs. Questions were answered to the patient's satisfaction  ALVA,RAKESH V.  [phone]

## 2013-03-10 NOTE — Progress Notes (Signed)
Pt discharged to home

## 2013-03-10 NOTE — Transfer of Care (Signed)
Immediate Anesthesia Transfer of Care Note  Patient: Rodney Powers  Procedure(s) Performed: Procedure(s): VIDEO BRONCHOSCOPY WITH ENDOBRONCHIAL ULTRASOUND (Bilateral)  Patient Location: PACU  Anesthesia Type:General  Level of Consciousness: awake, alert  and oriented  Airway & Oxygen Therapy: Patient Spontanous Breathing and Patient connected to face mask oxygen  Post-op Assessment: Report given to PACU RN, Post -op Vital signs reviewed and stable and Patient moving all extremities X 4  Post vital signs: Reviewed and stable  Complications: No apparent anesthesia complications

## 2013-03-11 ENCOUNTER — Other Ambulatory Visit: Payer: Self-pay | Admitting: Oncology

## 2013-03-11 ENCOUNTER — Encounter (HOSPITAL_COMMUNITY): Payer: Self-pay | Admitting: Pulmonary Disease

## 2013-03-13 LAB — CULTURE, BLOOD (ROUTINE X 2): Culture: NO GROWTH

## 2013-03-14 ENCOUNTER — Encounter: Payer: Self-pay | Admitting: Oncology

## 2013-03-14 ENCOUNTER — Ambulatory Visit (HOSPITAL_BASED_OUTPATIENT_CLINIC_OR_DEPARTMENT_OTHER): Payer: Medicaid Other | Admitting: Oncology

## 2013-03-14 ENCOUNTER — Ambulatory Visit: Payer: Self-pay

## 2013-03-14 ENCOUNTER — Other Ambulatory Visit: Payer: Self-pay | Admitting: Lab

## 2013-03-14 ENCOUNTER — Telehealth: Payer: Self-pay | Admitting: Oncology

## 2013-03-14 VITALS — BP 134/88 | HR 86 | Temp 97.8°F | Resp 18 | Ht 75.0 in | Wt 210.4 lb

## 2013-03-14 DIAGNOSIS — J449 Chronic obstructive pulmonary disease, unspecified: Secondary | ICD-10-CM

## 2013-03-14 DIAGNOSIS — R109 Unspecified abdominal pain: Secondary | ICD-10-CM

## 2013-03-14 DIAGNOSIS — R599 Enlarged lymph nodes, unspecified: Secondary | ICD-10-CM

## 2013-03-14 DIAGNOSIS — R918 Other nonspecific abnormal finding of lung field: Secondary | ICD-10-CM

## 2013-03-14 DIAGNOSIS — R222 Localized swelling, mass and lump, trunk: Secondary | ICD-10-CM

## 2013-03-14 LAB — CULTURE, BLOOD (ROUTINE X 2): Culture: NO GROWTH

## 2013-03-14 MED ORDER — HYDROCODONE-ACETAMINOPHEN 5-325 MG PO TABS: 1.0000 | ORAL_TABLET | Freq: Four times a day (QID) | ORAL | Status: DC | PRN

## 2013-03-14 NOTE — Progress Notes (Signed)
Checked in new patient. No financial issues, but I did give him an EPP. He applied for medicaid when he was in the hospital. He will call me with social worker's name/ph#.

## 2013-03-14 NOTE — Patient Instructions (Addendum)
1.  Issue:  Lung mass and enlarged nodes.  2.  Concerned for lung cancer.  However, biopsies of multiple areas did not show cancer. 3.  Further work up:  *  PET scan to see if positive uptake.  If positive, will need to refer back to Pulmonary or to Thoracic Surgery for biopsy.  *  If PET scan negative, we may consider observing and repeating CT scan in about 2 months.   4.  Follow up:  In about 3 weeks or when there is a tissue diagnosis.

## 2013-03-14 NOTE — Progress Notes (Signed)
[facility]  Telephone:([phone] Fax:(336) [phone]   MEDICAL ONCOLOGY - INITIAL CONSULATION    Referral MD:  Dr. Cyril Mourning, M.D.  Reason for Referral: left lung mass.     HPI:  [name]. Heart is a 57 year-old man with extensive history of smoking.  He has had stomach pain with tenesmus  for years.  These symptoms have been stable.  However, 2 weeks prior to recent admission, he developed solid more than liquid dysphagia, nausea/vomiting, 20-lb weight loss.  He presented to ED with CT chest showing left lung mass and mediastinal adenopathy.  He was first thought to have liver met.  Bu eval by IR was more consistent with hemangioma.  Dr. Vassie Loll performed EBUS on 03/10/2013 with 6/6 samples negative for cancer.  He was kindly referred to the Cancer for work up and treatment.   Rodney Powers presented to clinic for the first time today with a friend.  He still has solid more than liquid dysphagia.  He has no appetite and has lost 20-lb.  His abdominal pain, midepigastric area is chronic, stable, no need for pain med.  However, he has left shoulder pain.  He needed pain med; and was prescribed Percocet which he cannot afford.  He has generalized fatigue.  He is still independent of all activities of daily living.  He denied SOB, chest pain, cough, hemoptysis, visible source of bleeding, headache, confusion, focal motor weakness. The rest of the 14-point review of system was negative.    Past Medical History  Diagnosis Date  . Back pain, chronic   . COPD (chronic obstructive pulmonary disease)   . Diabetes mellitus, type II   :  Past Surgical History  Procedure Laterality Date  . Video bronchoscopy with endobronchial ultrasound Bilateral 03/10/2013    Procedure: VIDEO BRONCHOSCOPY WITH ENDOBRONCHIAL ULTRASOUND;  Surgeon: Oretha Milch, MD;  Location: [facility] OR;  Service: Pulmonary;  Laterality: Bilateral;  :  Current Outpatient Prescriptions  Medication Sig  Dispense Refill  . glimepiride (AMARYL) 1 MG tablet Take 1 tablet (1 mg total) by mouth daily before breakfast.  30 tablet  3  . levofloxacin (LEVAQUIN) 750 MG tablet Take 1 tablet (750 mg total) by mouth daily. X 7 DAYS  7 tablet  0  . Multiple Vitamin (MULTIVITAMIN WITH MINERALS) TABS Take 1 tablet by mouth daily.  30 tablet  3  . predniSONE (STERAPRED UNI-PAK) 10 MG tablet Prednisone dosing: Take prednsone 30mg  (3 tabs) x 3 days, then 20mg  (2 tabs) x 3days, then 10mg  (1 tab) x 3days, then OFF.  Dispense:  18 tabs, refills: None  18 tablet  0  . tamsulosin (FLOMAX) 0.4 MG CAPS Take 1 capsule (0.4 mg total) by mouth daily after supper.  30 capsule  3  . HYDROcodone-acetaminophen (NORCO) 5-325 MG per tablet Take 1 tablet by mouth every 6 (six) hours as needed for pain.  90 tablet  0   No current facility-administered medications for this visit.     No Known Allergies:  Family History  Problem Relation Age of Onset  . Aneurysm Mother   . Heart attack Father   . Aneurysm Sister   :  History   Social History  . Marital Status: Single    Spouse Name: N/A    Number of Children: 3  . Years of Education: N/A   Occupational History  .      [employer] man   Social History Main Topics  .  Smoking status: Current Every Day Smoker -- 1.00 packs/day    Types: Cigarettes  . Smokeless tobacco: Not on file  . Alcohol Use: 3.6 oz/week    6 Cans of beer per week     Comment: 4-5 days ago Pt. reports having a couple of beers  . Drug Use: Yes    Special: Marijuana     Comment: Pt. quit 30 yrs ago  . Sexually Active: No   Other Topics Concern  . Not on file   Social History Narrative  . No narrative on file  : Exam: ECOG 1.   General:  well-nourished man, in no acute distress.  Eyes:  no scleral icterus.  ENT:  There were no oropharyngeal lesions.  Neck was without thyromegaly.  Lymphatics:  Negative cervical, supraclavicular or axillary adenopathy.  Respiratory: lungs were clear  bilaterally without wheezing or crackles.  Cardiovascular:  Regular rate and rhythm, S1/S2, without murmur, rub or gallop.  There was no pedal edema.  GI:  abdomen was soft, flat, nontender, nondistended, without organomegaly.  Muscoloskeletal:  no spinal tenderness of palpation of vertebral spine.  Skin exam was without echymosis, petichae.  Neuro exam was nonfocal.  Patient was able to get on and off exam table without assistance.  Gait was normal.  Patient was alerted and oriented.  Attention was good.   Language was appropriate.  Mood was normal without depression.  Speech was not pressured.  Thought content was not tangential.     Lab Results  Component Value Date   WBC 12.1* 03/10/2013   HGB 10.2* 03/10/2013   HCT 30.9* 03/10/2013   PLT 511* 03/10/2013   GLUCOSE 103* 03/10/2013   ALT 51 03/06/2013   AST 46* 03/06/2013   NA 138 03/10/2013   K 4.0 03/10/2013   CL 108 03/10/2013   CREATININE 0.97 03/10/2013   BUN 15 03/10/2013   CO2 23 03/10/2013    Ct Chest W Contrast  03/06/2013  *RADIOLOGY REPORT*  Clinical Data: Mass noted on x-ray  CT CHEST WITH CONTRAST  Technique:  Multidetector CT imaging of the chest was performed following the standard protocol during bolus administration of intravenous contrast.  Contrast: 80mL OMNIPAQUE IOHEXOL 300 MG/ML  SOLN  Comparison: Chest x-ray same day  Findings: Sagittal images of the spine are unremarkable.  The images of the thoracic inlet are unremarkable.  Central airways are patent.  As noted on chest x-rays there is a nodular mass or confluent adenopathy in the left mediastinum extending in the left upper lobe suprahilar region.  There is low density centrally suspicious for necrosis the lesion measures at least 6.5 x 5.5 cm highly suspicious for malignancy.  Correlation with bronchoscopy and/or biopsy is recommended.  A AP window lymph node measures 1.4 x 1.5 cm.  Right pretracheal lymph node measures 1.6 x 1.3 cm.  Left hilar lymph node measures 2 x 1.9 cm.   No right hilar adenopathy is noted.  Images of the lung parenchyma shows bilateral interstitial prominence.  There is interstitial thickening with patchy airspace disease in the left upper lobe anteriorly suspicious for postobstructive pneumonia.  Large paraseptal emphysematous bullae are noted in the upper lobes bilaterally.  Small hiatal hernia.  There is a low density lesion in the right hepatic lobe laterally measures 1.7 cm.  Second low density lesion in the right hepatic lobe measures 2.2 cm.  These are highly suspicious for metastatic disease.  Further evaluation is recommended.  No adrenal gland mass is noted.  Bilateral nonspecific axillary lymph nodes are noted.  IMPRESSION:  1.  There is a mass or confluent adenopathy in the left mediastinum//suprahilar region measures 6.5 x 5.5 cm.  This is highly suspicious for malignancy.  Further evaluation with bronchoscopy and/or biopsy is recommended. 2.  There are pathologic mediastinal and left hilar lymph nodes. 3.  Airspace disease in the left upper lobe anteriorly suspicious for postobstructive pneumonia. 4.  Extensive  emphysematous changes bilateral upper lobe. 5.  Low density lesions are noted in the right hepatic lobe suspicious for metastatic disease.  Further evaluation is recommended.  Critical findings discussed with Dr. Ethelda Chick   Original Report Authenticated By: Natasha Mead, M.D.    Ct Abdomen Pelvis W Contrast  03/08/2013  *RADIOLOGY REPORT*  Clinical Data: History of ileus.  Periumbilical pain.  Diarrhea.  CT ABDOMEN AND PELVIS WITH CONTRAST  Technique:  Multidetector CT imaging of the abdomen and pelvis was performed following the standard protocol during bolus administration of intravenous contrast.  Contrast: OMNIPAQUE IOHEXOL 300 MG/ML  SOLN  Comparison: CT of the abdomen and pelvis 10/13/2011.  Findings:  Lung Bases: Linear opacity in the left lower lobe is similar to the prior study compatible with scarring.  Emphysematous changes  are noted in the lower lobes of the lungs bilaterally.  Abdomen/Pelvis:  Several small low to intermediate attenuation lesions are noted in the right lobe of the liver and appear similar to the prior examination from 10/13/2011.  These are favored to represent proteinaceous cysts.  The appearance of the gallbladder, pancreas, spleen and bilateral adrenal glands is unremarkable. There is a 1.7 cm low attenuation lesion in the lower pole of the left kidney, compatible with a simple cyst.  Multiple other subcentimeter low attenuation lesions are noted in the kidneys bilaterally, which are too small to definitively characterize, but are similar to the prior examination.  There is a trace amount of ascites in the central abdominal mesentery and in the lower anatomic pelvis.  No pneumoperitoneum.  No pathologic distension of small bowel.  There is a suggestion of some long segment narrowing of the terminal ileum, best demonstrated on images 45-54 of series 2, and there is a small amount of adjacent mesenteric free fluid. Prostate gland is markedly enlarged measuring approximately 7.1 x 6.9 cm.  Urinary bladder is mildly thick-walled, but otherwise unremarkable in appearance.  Musculoskeletal: There are no aggressive appearing lytic or blastic lesions noted in the visualized portions of the skeleton.  IMPRESSION: 1.  Findings are suspicious for long segment narrowing of the terminal ileum.  Given the adjacent small amount of free fluid in the mesentery tracking down in the pelvis, the possibility of inflammation from inflammatory bowel disease such as Crohn's disease is not excluded.  Clinical correlation is recommended. 2.  Nonspecific low intermediate attenuation liver lesions are similar to the prior examination but incompletely characterized on today's study.  These are favored to be benign, but could be definitively characterized with contrast-enhanced MRI of the liver if clinically indicated. 3.  Multiple renal  lesions bilaterally, the largest of which in the left kidney is compatible with a simple cyst.  The smaller lesions are too small to definitively characterize, but are similar to prior examinations and are therefore favored to represent cysts. 4.  Marked enlargement of the prostate gland. 5.  Additional incidental findings, as above.   Original Report Authenticated By: Trudie Reed, M.D.    Ct Pelvis Limited W/o Cm  03/07/2013  *RADIOLOGY REPORT*  Clinical  Data:  COPD, left upper lobe hilar mass  CT PELVIS WITHOUT CONTRAST  Technique:  Multidetector CT imaging of the pelvis was performed following the standard protocol without intravenous contrast.  Comparison:  03/06/2013, 10/13/2011  Findings:  Limited noncontrast CT imaging performed through the pelvic sacrum in preparation for presumed biopsy.  This was done with the patient prone.  Noncontrast imaging fails to demonstrate a sacral mass by CT.  No significant destructive osseous lesion.  No adenopathy.  Vascular calcifications noted.  Upon further chart review, CT sacral biopsy was ordered incorrectly on this patient Jaykub, Mackins. 21-Jan-1956). The patient with a sacral mass is Dyshon, [place].  Findings discussed with Dr.Alva  IMPRESSION: Limited pelvic CT negative for a sacral mass.  See above comment. No biopsy performed.  The patient should be credited for this exam.   Original Report Authenticated By: Judie Petit. Miles Costain, M.D.      Assessment and Plan:   1. Smoking history:  Patient reported none at this time. 2.  COPD:  Mild; no inhaler needed per PCP. 3.  Diabetes mellitus, type II:  On glimepride per PCP.  4.  Abdominal pain, tenesmus:  CT abdomen concerning for inflammatory disease.  Will defer this after work up and treatment for lung mass.  5.  Lung mass and mediastinal adenopathy in a patient with history of smoking, negative EBUS biopsy:  -Further work up:  *  PET scan to see if positive uptake.  If positive, will need to refer  back to Pulmonary or to Thoracic Surgery for biopsy.  Dr. Vassie Loll recommended CVTS instead of repeating EBUS.   *  If PET scan negative, we may consider observing and repeating CT scan in about 2 months.     Mr. Morten and his family friend expressed informed understanding and agreed with outlined plan.  4.  Follow up:  In about 3 weeks or when there is a tissue diagnosis.    The length of time of the face-to-face encounter was 45 minutes. More than 50% of time was spent counseling and coordination of care.     Thank you for this referral.

## 2013-03-15 ENCOUNTER — Other Ambulatory Visit: Payer: Self-pay | Admitting: Oncology

## 2013-03-15 ENCOUNTER — Ambulatory Visit (HOSPITAL_COMMUNITY)
Admit: 2013-03-15 | Discharge: 2013-03-15 | Disposition: A | Discharge status: Home / Self Care | Payer: Medicaid Other | Attending: Internal Medicine | Admitting: Internal Medicine

## 2013-03-15 ENCOUNTER — Encounter: Payer: Self-pay | Admitting: Oncology

## 2013-03-15 DIAGNOSIS — M799 Soft tissue disorder, unspecified: Secondary | ICD-10-CM | POA: Insufficient documentation

## 2013-03-15 DIAGNOSIS — R222 Localized swelling, mass and lump, trunk: Secondary | ICD-10-CM | POA: Insufficient documentation

## 2013-03-15 DIAGNOSIS — R911 Solitary pulmonary nodule: Secondary | ICD-10-CM | POA: Insufficient documentation

## 2013-03-15 DIAGNOSIS — R918 Other nonspecific abnormal finding of lung field: Secondary | ICD-10-CM

## 2013-03-15 DIAGNOSIS — N400 Enlarged prostate without lower urinary tract symptoms: Secondary | ICD-10-CM | POA: Insufficient documentation

## 2013-03-15 MED ORDER — FLUDEOXYGLUCOSE F - 18 (FDG) INJECTION
18.0000 | Freq: Once | INTRAVENOUS | Status: AC | PRN
  Administered 2013-03-15: 18 via INTRAVENOUS

## 2013-03-15 NOTE — Progress Notes (Signed)
Processed EPP-- 100% ind 03/15/13-09/14/13. All scans documented. Giving the patient the letter and card.

## 2013-03-16 ENCOUNTER — Ambulatory Visit: Payer: Self-pay | Admitting: Internal Medicine

## 2013-03-16 ENCOUNTER — Telehealth: Payer: Self-pay | Admitting: Oncology

## 2013-03-16 ENCOUNTER — Ambulatory Visit: Payer: Self-pay

## 2013-03-16 ENCOUNTER — Other Ambulatory Visit: Payer: Self-pay | Admitting: Oncology

## 2013-03-16 ENCOUNTER — Other Ambulatory Visit: Payer: Self-pay | Admitting: Lab

## 2013-03-16 DIAGNOSIS — J392 Other diseases of pharynx: Secondary | ICD-10-CM

## 2013-03-16 DIAGNOSIS — R918 Other nonspecific abnormal finding of lung field: Secondary | ICD-10-CM

## 2013-03-16 NOTE — Telephone Encounter (Signed)
S/w pt re appt w/Dr Pollyann Kennedy for 4/8 @ 1:40pm to arrive @ 1:25pm. Pt has appt d/t/location and is aware he will need to bring $100.00 deposit as his medicaid is pending. S/w Victorino Dike @ [facility] and per Victorino Dike when [facility] is active pt can bring card and they will reimburse him - pt aware. Faxed [facility] referral along w/referral/order from [facility] to [facility] today. Per Drinda Butts @ [facility] they will review info and call pt and me w/appt. Pt aware. No other orders.

## 2013-03-17 ENCOUNTER — Telehealth: Payer: Self-pay | Admitting: Oncology

## 2013-03-17 NOTE — Telephone Encounter (Signed)
Pt on schedule for appt @ [facility] 4/4. S/w pt and per pt [facility] did call him w/appt for tomorrow.

## 2013-03-18 ENCOUNTER — Telehealth: Payer: Self-pay | Admitting: Dietician

## 2013-03-18 ENCOUNTER — Institutional Professional Consult (permissible substitution) (INDEPENDENT_AMBULATORY_CARE_PROVIDER_SITE_OTHER): Payer: Medicaid Other | Admitting: Thoracic Surgery (Cardiothoracic Vascular Surgery)

## 2013-03-18 ENCOUNTER — Encounter: Payer: Self-pay | Admitting: Thoracic Surgery (Cardiothoracic Vascular Surgery)

## 2013-03-18 ENCOUNTER — Other Ambulatory Visit: Payer: Self-pay

## 2013-03-18 VITALS — BP 130/80 | HR 120 | Resp 20 | Ht 75.0 in | Wt 210.0 lb

## 2013-03-18 DIAGNOSIS — R590 Localized enlarged lymph nodes: Secondary | ICD-10-CM

## 2013-03-18 DIAGNOSIS — R222 Localized swelling, mass and lump, trunk: Secondary | ICD-10-CM

## 2013-03-18 DIAGNOSIS — R599 Enlarged lymph nodes, unspecified: Secondary | ICD-10-CM

## 2013-03-18 DIAGNOSIS — R918 Other nonspecific abnormal finding of lung field: Secondary | ICD-10-CM

## 2013-03-18 NOTE — Progress Notes (Signed)
PCP is Default, Provider, MD Referring Provider is Ha, Huan T, MD  Chief Complaint  Patient presents with  . Lung Mass    surgical eval on lung mass, PET Scan 03/15/2013    HPI: 56-year-old presents with chief complaint of difficulty swallowing.  Rodney Powers is a 56-year-old gentleman who says that he felt well up until about 3- 4 weeks ago. Around that time he noticed that when he ate he had the sensation of food sticking in his chest and a little while later would throw up. The food would not be mixed with any gastric contents, that would just be the food that he had eaten. Over time this actually has improved and he now can swallow, but he "wants to know where the food is going" because he only had 2 bowel movements in the past 3 weeks. He also with has been experiencing some shortness of breath with exertion, but he has not had any chest pain. He does feel cold all the time, but denies any fevers, sweats, or shaking chills. He did have a cough which was productive, but has gotten better. He has not had any hemoptysis. He has experienced a 25 pound weight loss over the past 4 weeks. He has a 60-pack-year history of smoking.  His initial complaints led to a chest x-ray on 03/06/13 which showed a left hilar mass. A CT showed a large aortopulmonary window mass and some slightly enlarged mediastinal lymph nodes. He then had a bronchoscopy and endobronchial ultrasound, this was nondiagnostic. He socially had a PET/CT which showed the aortopulmonary window mass and mediastinal nodes to be hypermetabolic. There was also a focus of hypermetabolism noted in the hypopharynx.   Past Medical History  Diagnosis Date  . Back pain, chronic   . COPD (chronic obstructive pulmonary disease)   . Diabetes mellitus, type II     Past Surgical History  Procedure Laterality Date  . Video bronchoscopy with endobronchial ultrasound Bilateral 03/10/2013    Procedure: VIDEO BRONCHOSCOPY WITH ENDOBRONCHIAL ULTRASOUND;   Surgeon: Rakesh V Alva, MD;  Location: [facility] OR;  Service: Pulmonary;  Laterality: Bilateral;    Family History  Problem Relation Age of Onset  . Aneurysm Mother   . Heart attack Father   . Aneurysm Sister     Social History History  Substance Use Topics  . Smoking status: Current Every Day Smoker -- 1.50 packs/day for 40 years    Types: Cigarettes    Start date: 07/18/1971  . Smokeless tobacco: Not on file  . Alcohol Use: 3.6 oz/week    6 Cans of beer per week     Comment: 4-5 days ago Pt. reports having a couple of beers    Current Outpatient Prescriptions  Medication Sig Dispense Refill  . glimepiride (AMARYL) 1 MG tablet Take 1 tablet (1 mg total) by mouth daily before breakfast.  30 tablet  3  . HYDROcodone-acetaminophen (NORCO) 5-325 MG per tablet Take 1 tablet by mouth every 6 (six) hours as needed for pain.  90 tablet  0  . levofloxacin (LEVAQUIN) 750 MG tablet Take 1 tablet (750 mg total) by mouth daily. X 7 DAYS  7 tablet  0  . Multiple Vitamin (MULTIVITAMIN WITH MINERALS) TABS Take 1 tablet by mouth daily.  30 tablet  3  . predniSONE (STERAPRED UNI-PAK) 10 MG tablet Prednisone dosing: Take prednsone 30mg (3 tabs) x 3 days, then 20mg (2 tabs) x 3days, then 10mg (1 tab) x 3days, then OFF.  Dispense:    18 tabs, refills: None  18 tablet  0  . tamsulosin (FLOMAX) 0.4 MG CAPS Take 1 capsule (0.4 mg total) by mouth daily after supper.  30 capsule  3   No current facility-administered medications for this visit.    No Known Allergies  Review of Systems  Constitutional: Positive for chills, appetite change and unexpected weight change (lost 25 pounds in 4 weeks). Negative for fever.  Respiratory: Positive for cough (productive at first now decreasing) and shortness of breath (with exertion). Negative for wheezing and stridor.   Cardiovascular: Negative for chest pain and leg swelling.  Gastrointestinal: Positive for vomiting, abdominal pain, constipation, blood in stool (tarry  stools) and abdominal distention.       Difficulty swallowing  Genitourinary: Positive for dysuria, urgency and frequency.    BP 130/80  Pulse 120  Resp 20  Ht 6' 3" (1.905 m)  Wt 210 lb (95.255 kg)  BMI 26.25 kg/m2  SpO2 96% Physical Exam  Vitals reviewed. Constitutional: He is oriented to person, place, and time. He appears well-developed and well-nourished. No distress.  HENT:  Soft tissue mass right forehead- soft and mobile  Eyes: EOM are normal. Pupils are equal, round, and reactive to light.  Neck: Neck supple. No thyromegaly present.  Cardiovascular: Normal rate, regular rhythm, normal heart sounds and intact distal pulses.   Pulmonary/Chest: Effort normal and breath sounds normal. He has no wheezes. He has no rales.  Abdominal: He exhibits distension. There is no tenderness.  Musculoskeletal: Normal range of motion. He exhibits no edema.  Lymphadenopathy:    He has no cervical adenopathy.  Neurological: He is alert and oriented to person, place, and time. No cranial nerve deficit.  Skin: Skin is warm and dry.     Diagnostic Tests: CT of chest 03/06/2013. *RADIOLOGY REPORT*  Clinical Data: Mass noted on x-ray  CT CHEST WITH CONTRAST  Technique: Multidetector CT imaging of the chest was performed  following the standard protocol during bolus administration of  intravenous contrast.  Contrast: 80mL OMNIPAQUE IOHEXOL 300 MG/ML SOLN  Comparison: Chest x-ray same day  Findings: Sagittal images of the spine are unremarkable. The  images of the thoracic inlet are unremarkable. Central airways are  patent. As noted on chest x-rays there is a nodular mass or  confluent adenopathy in the left mediastinum extending in the left  upper lobe suprahilar region. There is low density centrally  suspicious for necrosis the lesion measures at least 6.5 x 5.5 cm  highly suspicious for malignancy. Correlation with bronchoscopy  and/or biopsy is recommended.  A AP window lymph node  measures 1.4 x 1.5 cm. Right pretracheal  lymph node measures 1.6 x 1.3 cm. Left hilar lymph node measures 2  x 1.9 cm. No right hilar adenopathy is noted.  Images of the lung parenchyma shows bilateral interstitial  prominence. There is interstitial thickening with patchy airspace  disease in the left upper lobe anteriorly suspicious for  postobstructive pneumonia. Large paraseptal emphysematous bullae  are noted in the upper lobes bilaterally.  Small hiatal hernia. There is a low density lesion in the right  hepatic lobe laterally measures 1.7 cm. Second low density lesion  in the right hepatic lobe measures 2.2 cm. These are highly  suspicious for metastatic disease. Further evaluation is  recommended. No adrenal gland mass is noted.  Bilateral nonspecific axillary lymph nodes are noted.  IMPRESSION:  1. There is a mass or confluent adenopathy in the left  mediastinum//suprahilar region measures 6.5   x 5.5 cm. This is  highly suspicious for malignancy. Further evaluation with  bronchoscopy and/or biopsy is recommended.  2. There are pathologic mediastinal and left hilar lymph nodes.  3. Airspace disease in the left upper lobe anteriorly suspicious  for postobstructive pneumonia.  4. Extensive emphysematous changes bilateral upper lobe.  5. Low density lesions are noted in the right hepatic lobe  suspicious for metastatic disease. Further evaluation is  recommended.  Critical findings discussed with Dr. Jacubowitz  Original Report Authenticated By: Liviu Pop, M.D.  PET/CT for 114 NUCLEAR MEDICINE PET SKULL BASE TO THIGH  Fasting Blood Glucose: 94  Technique: 18 mCi F-18 FDG was injected intravenously. CT data was  obtained and used for attenuation correction and anatomic  localization only. (This was not acquired as a diagnostic CT  examination.) Additional exam technical data entered on  technologist worksheet.  Comparison: 10/13/2011  Findings:  Neck: Within the right side  of the hypopharynx there is abnormal  soft tissue thickening with associated increased FDG uptake. This  measures 1.6 x 2.1 cm and has an SUV max equal to 9.6, image 33.  No hypermetabolic lymph nodes identified within the neck.  Chest: Large prevascular mass is identified extending into the  left upper lobe. This measures 5 x 6.2 by 3.9 cm. The SUV max  associated this mass is equal to 27. Hypermetabolic left hilar  lymph node has an SUV max equal to 6.8, image 92. There is an AP  window lymph node which is hypermetabolic. This measures 1.3 cm  and has an SUV max equal to 6.30, image 87. There is no pleural  effusion identified. Advanced changes of centrilobular and  paraseptal emphysema identified. Pulmonary nodule within the  posterior left upper lobe measures 7 mm. The SUV max associated  this nodule is equal to 2.5.  Abdomen/Pelvis: Within the inferior right hepatic lobe there is a  1.8 cm hypodense structure, image 143. There is no abnormal FDG  uptake associated with this structure. The pancreas and the spleen  both appear normal. The adrenal glands are unremarkable. The  prostate gland is enlarged and there is diffuse bladder wall  thickening suggesting chronic bladder outlet obstruction. There is  a focal area of increased uptake within the central aspect of the  posterior prostate gland. This is nonspecific with an SUV max  equal to 6.5.  Skeleton: No focal hypermetabolic activity to suggest skeletal  metastasis.  IMPRESSION:  1. There is a large hypermetabolic mass centered over the  prevascular lymph node region and extending into the left upper  lobe. This is concerning for bronchogenic carcinoma.  2. Hypermetabolic left hilar and AP window lymph nodes which are  worrisome for metastatic adenopathy.  3. Left upper lobe pulmonary nodule exhibits malignant range FDG  uptake within SUV max equal to 2.5.  4. Within the right side of the hypopharynx there is a soft tissue   attenuating lesion which exhibits intense FDG uptake. Cannot rule  out primary head neck carcinoma. Correlation with direct  visualization is advised. Additionally, contrast-enhanced CT of  the neck may be helpful for further characterization.  Original Report Authenticated By: Taylor Stroud, M.D.  Impression: Rodney Powers is a 56-year-old smoker who presents with dysphagia, shortness of breath, and weight loss over the past month. He has a large aortopulmonary window mass as well as some mildly enlarged external lymph nodes. Bronchoscopy and endobronchial ultrasound were nondiagnostic. The mass is hypermetabolic by PET and remains highly suspicious for   malignancy. A tissue diagnosis is a must in order to guide appropriate treatment.  I recommended to Rodney Powers that we proceed with a left anterior mediastinotomy (Chamberlain procedure). I described the procedure to him including the need for general anesthesia and the incision to be used. We discussed the indications, risks, benefits, and alternatives. He understands the risks include, but are not limited to, death, MI, DVT, PE, bleeding, infection, pneumothorax, nondiagnostic biopsies, as well as potential for unforeseeable complications.  He accepts these risks and wishes to proceed.  Plan: Left anterior mediastinotomy (Chamberlain procedure) on Thursday, 03/24/2013. Hopefully we can do this on an outpatient basis 

## 2013-03-18 NOTE — Telephone Encounter (Signed)
Brief Outpatient Oncology Nutrition Note  Patient has been identified to be at risk on malnutrition screen.   Wt Readings from Last 10 Encounters:  03/18/13 210 lb (95.255 kg)  03/14/13 210 lb 6.4 oz (95.437 kg)  03/10/13 219 lb 8 oz (99.565 kg)  03/10/13 219 lb 8 oz (99.565 kg)  10/13/11 235 lb (106.595 kg)    Chart reviewed.  Patient with lung cancer.  Poor appetite and weight loss as above.  Called patient and left message with outpatient [facility].  Oran Rein, RD, LDN

## 2013-03-21 ENCOUNTER — Encounter (HOSPITAL_COMMUNITY): Payer: Self-pay | Admitting: Pharmacy Technician

## 2013-03-21 ENCOUNTER — Telehealth: Payer: Self-pay | Admitting: Pulmonary Disease

## 2013-03-21 NOTE — Telephone Encounter (Signed)
I spoke with pt and he stated he thought he had an appt with RA tomorrow. I advised him he did not have one until 03/24/13 at 1:30. He stated he had his dates mixed up and this was fine. Nothing further was needed

## 2013-03-22 NOTE — Pre-Procedure Instructions (Signed)
Rodney Powers  03/22/2013   Your procedure is scheduled on:  April 10  Report to [facility] at 05:30 AM.  Call this number if you have problems the morning of surgery: [phone]   Remember:   Do not eat food or drink liquids after midnight.   Take these medicines the morning of surgery with A SIP OF WATER: Hydrocodone (if needed)   Do not wear jewelry, make-up or nail polish.  Do not wear lotions, powders, or perfumes. You may wear deodorant.  Do not shave 48 hours prior to surgery. Men may shave face and neck.  Do not bring valuables to the hospital.  Contacts, dentures or bridgework may not be worn into surgery.  Leave suitcase in the car. After surgery it may be brought to your room.  For patients admitted to the hospital, checkout time is 11:00 AM the day of discharge.   Patients discharged the day of surgery will not be allowed to drive home.  Name and phone number of your driver: Family/ Friend  Special Instructions: Shower using CHG 2 nights before surgery and the night before surgery.  If you shower the day of surgery use CHG.  Use special wash - you have one bottle of CHG for all showers.  You should use approximately 1/3 of the bottle for each shower.   Please read over the following fact sheets that you were given: Pain Booklet, Coughing and Deep Breathing, Blood Transfusion Information and Surgical Site Infection Prevention

## 2013-03-23 ENCOUNTER — Encounter (HOSPITAL_COMMUNITY)
Admission: RE | Admit: 2013-03-23 | Discharge: 2013-03-23 | Disposition: A | Discharge status: Home / Self Care | Payer: Medicaid Other | Source: Ambulatory Visit | Attending: Thoracic Surgery (Cardiothoracic Vascular Surgery) | Admitting: Thoracic Surgery (Cardiothoracic Vascular Surgery)

## 2013-03-23 ENCOUNTER — Encounter (HOSPITAL_COMMUNITY): Payer: Self-pay

## 2013-03-23 VITALS — BP 110/69 | HR 94 | Temp 97.9°F | Resp 20 | Ht 75.0 in | Wt 209.0 lb

## 2013-03-23 DIAGNOSIS — R222 Localized swelling, mass and lump, trunk: Secondary | ICD-10-CM

## 2013-03-23 HISTORY — DX: Retention of urine, unspecified: R33.9

## 2013-03-23 HISTORY — DX: Other diseases of mediastinum, not elsewhere classified: J98.59

## 2013-03-23 HISTORY — DX: Anemia, unspecified: D64.9

## 2013-03-23 LAB — URINALYSIS, ROUTINE W REFLEX MICROSCOPIC
Nitrite: NEGATIVE
Specific Gravity, Urine: 1.033 — ABNORMAL HIGH (ref 1.005–1.030)
Urobilinogen, UA: 0.2 mg/dL (ref 0.0–1.0)
pH: 5.5 (ref 5.0–8.0)

## 2013-03-23 LAB — COMPREHENSIVE METABOLIC PANEL
ALT: 70 U/L — ABNORMAL HIGH (ref 0–53)
Albumin: 2.5 g/dL — ABNORMAL LOW (ref 3.5–5.2)
Alkaline Phosphatase: 103 U/L (ref 39–117)
BUN: 16 mg/dL (ref 6–23)
Chloride: 103 mEq/L (ref 96–112)
Glucose, Bld: 129 mg/dL — ABNORMAL HIGH (ref 70–99)
Potassium: 3.5 mEq/L (ref 3.5–5.1)
Sodium: 136 mEq/L (ref 135–145)
Total Bilirubin: 0.2 mg/dL — ABNORMAL LOW (ref 0.3–1.2)

## 2013-03-23 LAB — TYPE AND SCREEN: Antibody Screen: NEGATIVE

## 2013-03-23 LAB — URINE MICROSCOPIC-ADD ON

## 2013-03-23 LAB — BLOOD GAS, ARTERIAL
Bicarbonate: 23.5 mEq/L (ref 20.0–24.0)
TCO2: 24.6 mmol/L (ref 0–100)
pCO2 arterial: 35.4 mmHg (ref 35.0–45.0)
pH, Arterial: 7.438 (ref 7.350–7.450)

## 2013-03-23 LAB — CBC
HCT: 34.6 % — ABNORMAL LOW (ref 39.0–52.0)
Hemoglobin: 11.4 g/dL — ABNORMAL LOW (ref 13.0–17.0)
RDW: 14.7 % (ref 11.5–15.5)
WBC: 12.5 10*3/uL — ABNORMAL HIGH (ref 4.0–10.5)

## 2013-03-23 LAB — PROTIME-INR: Prothrombin Time: 13.2 seconds (ref 11.6–15.2)

## 2013-03-23 MED ORDER — DEXTROSE 5 % IV SOLN
1.5000 g | INTRAVENOUS | Status: AC
  Administered 2013-03-24: 1.5 g via INTRAVENOUS
  Filled 2013-03-23: qty 1.5

## 2013-03-24 ENCOUNTER — Encounter (HOSPITAL_COMMUNITY): Payer: Self-pay | Admitting: Anesthesiology

## 2013-03-24 ENCOUNTER — Encounter (HOSPITAL_COMMUNITY)
Admission: RE | Disposition: A | Discharge status: Home / Self Care | Payer: Self-pay | Source: Ambulatory Visit | Attending: Thoracic Surgery (Cardiothoracic Vascular Surgery)

## 2013-03-24 ENCOUNTER — Inpatient Hospital Stay: Payer: Self-pay | Admitting: Pulmonary Disease

## 2013-03-24 ENCOUNTER — Ambulatory Visit (HOSPITAL_COMMUNITY): Payer: Medicaid Other | Admitting: Anesthesiology

## 2013-03-24 ENCOUNTER — Observation Stay (HOSPITAL_COMMUNITY)
Admission: RE | Admit: 2013-03-24 | Discharge: 2013-03-25 | Disposition: A | Discharge status: Home / Self Care | Payer: Medicaid Other | Source: Ambulatory Visit | Attending: Thoracic Surgery (Cardiothoracic Vascular Surgery) | Admitting: Thoracic Surgery (Cardiothoracic Vascular Surgery)

## 2013-03-24 ENCOUNTER — Ambulatory Visit (HOSPITAL_COMMUNITY): Payer: Medicaid Other

## 2013-03-24 DIAGNOSIS — R0602 Shortness of breath: Secondary | ICD-10-CM | POA: Insufficient documentation

## 2013-03-24 DIAGNOSIS — C3490 Malignant neoplasm of unspecified part of unspecified bronchus or lung: Principal | ICD-10-CM | POA: Insufficient documentation

## 2013-03-24 DIAGNOSIS — J449 Chronic obstructive pulmonary disease, unspecified: Secondary | ICD-10-CM | POA: Insufficient documentation

## 2013-03-24 DIAGNOSIS — R1310 Dysphagia, unspecified: Secondary | ICD-10-CM | POA: Insufficient documentation

## 2013-03-24 DIAGNOSIS — R634 Abnormal weight loss: Secondary | ICD-10-CM | POA: Insufficient documentation

## 2013-03-24 DIAGNOSIS — R222 Localized swelling, mass and lump, trunk: Secondary | ICD-10-CM | POA: Insufficient documentation

## 2013-03-24 DIAGNOSIS — J4489 Other specified chronic obstructive pulmonary disease: Secondary | ICD-10-CM | POA: Insufficient documentation

## 2013-03-24 DIAGNOSIS — E119 Type 2 diabetes mellitus without complications: Secondary | ICD-10-CM | POA: Insufficient documentation

## 2013-03-24 HISTORY — PX: MEDIASTINOTOMY CHAMBERLAIN MCNEIL: SHX5966

## 2013-03-24 LAB — GLUCOSE, CAPILLARY
Glucose-Capillary: 108 mg/dL — ABNORMAL HIGH (ref 70–99)
Glucose-Capillary: 125 mg/dL — ABNORMAL HIGH (ref 70–99)
Glucose-Capillary: 147 mg/dL — ABNORMAL HIGH (ref 70–99)

## 2013-03-24 SURGERY — MEDIASTINOTOMY, CHAMBERLAIN
Anesthesia: General | Laterality: Left | Wound class: Clean

## 2013-03-24 MED ORDER — OXYCODONE-ACETAMINOPHEN 5-325 MG PO TABS
1.0000 | ORAL_TABLET | ORAL | Status: DC | PRN
  Administered 2013-03-24 – 2013-03-25 (×3): 1 via ORAL
  Filled 2013-03-24: qty 1
  Filled 2013-03-24: qty 2

## 2013-03-24 MED ORDER — GLIMEPIRIDE 1 MG PO TABS
1.0000 mg | ORAL_TABLET | Freq: Every day | ORAL | Status: DC
  Administered 2013-03-25: 1 mg via ORAL
  Filled 2013-03-24 (×2): qty 1

## 2013-03-24 MED ORDER — PROPOFOL 10 MG/ML IV BOLUS
INTRAVENOUS | Status: DC | PRN
  Administered 2013-03-24: 50 mg via INTRAVENOUS
  Administered 2013-03-24: 200 mg via INTRAVENOUS

## 2013-03-24 MED ORDER — NEOSTIGMINE METHYLSULFATE 1 MG/ML IJ SOLN
INTRAMUSCULAR | Status: DC | PRN
  Administered 2013-03-24: 5 mg via INTRAVENOUS

## 2013-03-24 MED ORDER — OXYCODONE HCL 5 MG PO TABS
5.0000 mg | ORAL_TABLET | ORAL | Status: AC | PRN
  Administered 2013-03-24 – 2013-03-25 (×3): 5 mg via ORAL
  Administered 2013-03-25: 10 mg via ORAL
  Filled 2013-03-24: qty 1
  Filled 2013-03-24: qty 2
  Filled 2013-03-24: qty 1

## 2013-03-24 MED ORDER — ONDANSETRON HCL 4 MG/2ML IJ SOLN
INTRAMUSCULAR | Status: DC | PRN
  Administered 2013-03-24: 4 mg via INTRAVENOUS

## 2013-03-24 MED ORDER — HYDROMORPHONE HCL PF 1 MG/ML IJ SOLN
INTRAMUSCULAR | Status: AC
  Filled 2013-03-24: qty 2

## 2013-03-24 MED ORDER — OXYCODONE-ACETAMINOPHEN 5-325 MG PO TABS
1.0000 | ORAL_TABLET | ORAL | Status: DC | PRN
  Filled 2013-03-24: qty 1

## 2013-03-24 MED ORDER — TAMSULOSIN HCL 0.4 MG PO CAPS
0.4000 mg | ORAL_CAPSULE | Freq: Every day | ORAL | Status: DC
  Administered 2013-03-24: 0.4 mg via ORAL
  Filled 2013-03-24 (×2): qty 1

## 2013-03-24 MED ORDER — 0.9 % SODIUM CHLORIDE (POUR BTL) OPTIME
TOPICAL | Status: DC | PRN
  Administered 2013-03-24: 1000 mL

## 2013-03-24 MED ORDER — DEXTROSE 5 % IV SOLN
1.5000 g | Freq: Two times a day (BID) | INTRAVENOUS | Status: AC
  Administered 2013-03-24 – 2013-03-25 (×2): 1.5 g via INTRAVENOUS
  Filled 2013-03-24 (×2): qty 1.5

## 2013-03-24 MED ORDER — POTASSIUM CHLORIDE 10 MEQ/50ML IV SOLN: 10.0000 meq | Freq: Every day | INTRAVENOUS | Status: DC | PRN

## 2013-03-24 MED ORDER — GLYCOPYRROLATE 0.2 MG/ML IJ SOLN
INTRAMUSCULAR | Status: DC | PRN
  Administered 2013-03-24: .8 mg via INTRAVENOUS

## 2013-03-24 MED ORDER — INSULIN ASPART 100 UNIT/ML ~~LOC~~ SOLN
0.0000 [IU] | Freq: Three times a day (TID) | SUBCUTANEOUS | Status: DC
  Administered 2013-03-24 – 2013-03-25 (×4): 2 [IU] via SUBCUTANEOUS

## 2013-03-24 MED ORDER — ZOLPIDEM TARTRATE 5 MG PO TABS: 5.0000 mg | ORAL_TABLET | Freq: Every evening | ORAL | Status: DC | PRN

## 2013-03-24 MED ORDER — BUPIVACAINE HCL (PF) 0.5 % IJ SOLN
INTRAMUSCULAR | Status: AC
  Filled 2013-03-24: qty 30

## 2013-03-24 MED ORDER — ROCURONIUM BROMIDE 100 MG/10ML IV SOLN
INTRAVENOUS | Status: DC | PRN
  Administered 2013-03-24 (×2): 10 mg via INTRAVENOUS
  Administered 2013-03-24: 20 mg via INTRAVENOUS
  Administered 2013-03-24: 50 mg via INTRAVENOUS

## 2013-03-24 MED ORDER — BUPIVACAINE HCL (PF) 0.5 % IJ SOLN
INTRAMUSCULAR | Status: DC | PRN
  Administered 2013-03-24: 10 mL

## 2013-03-24 MED ORDER — LACTATED RINGERS IV SOLN
INTRAVENOUS | Status: DC | PRN
  Administered 2013-03-24: 07:00:00 via INTRAVENOUS

## 2013-03-24 MED ORDER — ONDANSETRON HCL 4 MG/2ML IJ SOLN: 4.0000 mg | Freq: Four times a day (QID) | INTRAMUSCULAR | Status: DC | PRN

## 2013-03-24 MED ORDER — HYDROMORPHONE HCL PF 1 MG/ML IJ SOLN
0.2500 mg | INTRAMUSCULAR | Status: DC | PRN
  Administered 2013-03-24: 0.25 mg via INTRAVENOUS
  Administered 2013-03-24 (×2): 0.5 mg via INTRAVENOUS
  Administered 2013-03-24: 0.25 mg via INTRAVENOUS
  Administered 2013-03-24: 0.5 mg via INTRAVENOUS

## 2013-03-24 MED ORDER — MIDAZOLAM HCL 5 MG/5ML IJ SOLN
INTRAMUSCULAR | Status: DC | PRN
  Administered 2013-03-24 (×2): 1 mg via INTRAVENOUS

## 2013-03-24 MED ORDER — METOCLOPRAMIDE HCL 5 MG/ML IJ SOLN: 10.0000 mg | Freq: Once | INTRAMUSCULAR | Status: DC | PRN

## 2013-03-24 MED ORDER — HEMOSTATIC AGENTS (NO CHARGE) OPTIME
TOPICAL | Status: DC | PRN
  Administered 2013-03-24: 1 via TOPICAL

## 2013-03-24 MED ORDER — OXYCODONE HCL 5 MG PO TABS
5.0000 mg | ORAL_TABLET | Freq: Once | ORAL | Status: AC | PRN
  Filled 2013-03-24: qty 1

## 2013-03-24 MED ORDER — LACTATED RINGERS IV SOLN
INTRAVENOUS | Status: DC | PRN
  Administered 2013-03-24 (×2): via INTRAVENOUS

## 2013-03-24 MED ORDER — ARTIFICIAL TEARS OP OINT
TOPICAL_OINTMENT | OPHTHALMIC | Status: DC | PRN
  Administered 2013-03-24: 1 via OPHTHALMIC

## 2013-03-24 MED ORDER — ALBUTEROL SULFATE HFA 108 (90 BASE) MCG/ACT IN AERS
2.0000 | INHALATION_SPRAY | Freq: Four times a day (QID) | RESPIRATORY_TRACT | Status: DC | PRN
  Filled 2013-03-24: qty 6.7

## 2013-03-24 MED ORDER — LIDOCAINE HCL (CARDIAC) 20 MG/ML IV SOLN
INTRAVENOUS | Status: DC | PRN
  Administered 2013-03-24: 100 mg via INTRAVENOUS

## 2013-03-24 MED ORDER — BISACODYL 5 MG PO TBEC
10.0000 mg | DELAYED_RELEASE_TABLET | Freq: Every day | ORAL | Status: DC
  Administered 2013-03-25: 10 mg via ORAL
  Filled 2013-03-24: qty 2

## 2013-03-24 MED ORDER — OXYCODONE HCL 5 MG/5ML PO SOLN: 5.0000 mg | Freq: Once | ORAL | Status: AC | PRN

## 2013-03-24 MED ORDER — ACETAMINOPHEN 10 MG/ML IV SOLN
1000.0000 mg | Freq: Four times a day (QID) | INTRAVENOUS | Status: DC
  Administered 2013-03-24 – 2013-03-25 (×3): 1000 mg via INTRAVENOUS
  Filled 2013-03-24 (×4): qty 100

## 2013-03-24 MED ORDER — FENTANYL CITRATE 0.05 MG/ML IJ SOLN
INTRAMUSCULAR | Status: DC | PRN
  Administered 2013-03-24 (×8): 50 ug via INTRAVENOUS
  Administered 2013-03-24: 100 ug via INTRAVENOUS

## 2013-03-24 SURGICAL SUPPLY — 62 items
APPLIER CLIP LOGIC TI 5 (MISCELLANEOUS) IMPLANT
BLADE SURG 15 STRL LF DISP TIS (BLADE) ×1 IMPLANT
BLADE SURG 15 STRL SS (BLADE) ×1
CANISTER SUCTION 2500CC (MISCELLANEOUS) ×2 IMPLANT
CATH ROBINSON RED A/P 18FR (CATHETERS) ×2 IMPLANT
CATH THORACIC 28FR (CATHETERS) ×2 IMPLANT
CLIP TI MEDIUM 24 (CLIP) ×2 IMPLANT
CLOTH BEACON ORANGE TIMEOUT ST (SAFETY) ×2 IMPLANT
CONT SPEC 4OZ CLIKSEAL STRL BL (MISCELLANEOUS) ×10 IMPLANT
COVER SURGICAL LIGHT HANDLE (MISCELLANEOUS) ×4 IMPLANT
DERMABOND ADVANCED (GAUZE/BANDAGES/DRESSINGS) ×1
DERMABOND ADVANCED .7 DNX12 (GAUZE/BANDAGES/DRESSINGS) ×1 IMPLANT
DRAPE LAPAROTOMY T 102X78X121 (DRAPES) ×2 IMPLANT
ELECT BLADE 6.5 EXT (BLADE) ×2 IMPLANT
ELECT REM PT RETURN 9FT ADLT (ELECTROSURGICAL) ×2
ELECTRODE REM PT RTRN 9FT ADLT (ELECTROSURGICAL) ×1 IMPLANT
GAUZE SPONGE 4X4 16PLY XRAY LF (GAUZE/BANDAGES/DRESSINGS) ×2 IMPLANT
GLOVE BIOGEL PI IND STRL 6 (GLOVE) ×1 IMPLANT
GLOVE BIOGEL PI IND STRL 7.0 (GLOVE) ×4 IMPLANT
GLOVE BIOGEL PI INDICATOR 6 (GLOVE) ×1
GLOVE BIOGEL PI INDICATOR 7.0 (GLOVE) ×4
GLOVE EUDERMIC 7 POWDERFREE (GLOVE) ×2 IMPLANT
GOWN PREVENTION PLUS XLARGE (GOWN DISPOSABLE) ×2 IMPLANT
GOWN STRL NON-REIN LRG LVL3 (GOWN DISPOSABLE) ×4 IMPLANT
HEMOSTAT SURGICEL 2X14 (HEMOSTASIS) IMPLANT
KIT BASIN OR (CUSTOM PROCEDURE TRAY) ×2 IMPLANT
KIT ROOM TURNOVER OR (KITS) ×2 IMPLANT
NEEDLE 22X1 1/2 (OR ONLY) (NEEDLE) ×4 IMPLANT
NEEDLE BIOPSY 14X6 SOFT TISS (NEEDLE) ×2 IMPLANT
NS IRRIG 1000ML POUR BTL (IV SOLUTION) ×4 IMPLANT
PACK GENERAL/GYN (CUSTOM PROCEDURE TRAY) ×2 IMPLANT
PAD ARMBOARD 7.5X6 YLW CONV (MISCELLANEOUS) ×4 IMPLANT
PENCIL BUTTON HOLSTER BLD 10FT (ELECTRODE) ×4 IMPLANT
SEALANT PROGEL (MISCELLANEOUS) ×2 IMPLANT
SPONGE GAUZE 4X4 12PLY (GAUZE/BANDAGES/DRESSINGS) ×2 IMPLANT
SPONGE INTESTINAL PEANUT (DISPOSABLE) IMPLANT
SUT PROLENE 5 0 C 1 36 (SUTURE) ×6 IMPLANT
SUT SILK  1 MH (SUTURE) ×1
SUT SILK 1 MH (SUTURE) ×1 IMPLANT
SUT SILK 2 0 SH CR/8 (SUTURE) ×2 IMPLANT
SUT SILK 2 0 TIES 10X30 (SUTURE) IMPLANT
SUT SILK 3 0 SH CR/8 (SUTURE) ×2 IMPLANT
SUT VIC AB 1 CTX 36 (SUTURE) ×1
SUT VIC AB 1 CTX36XBRD ANBCTR (SUTURE) ×1 IMPLANT
SUT VIC AB 2-0 CT1 27 (SUTURE) ×1
SUT VIC AB 2-0 CT1 TAPERPNT 27 (SUTURE) ×1 IMPLANT
SUT VIC AB 3-0 SH 18 (SUTURE) IMPLANT
SUT VIC AB 3-0 SH 27 (SUTURE) ×1
SUT VIC AB 3-0 SH 27X BRD (SUTURE) ×1 IMPLANT
SUT VIC AB 3-0 X1 27 (SUTURE) ×2 IMPLANT
SUT VICRYL 0 UR6 27IN ABS (SUTURE) IMPLANT
SWAB COLLECTION DEVICE MRSA (MISCELLANEOUS) IMPLANT
SYR CONTROL 10ML LL (SYRINGE) ×2 IMPLANT
SYRINGE 10CC LL (SYRINGE) IMPLANT
SYSTEM SAHARA CHEST DRAIN RE-I (WOUND CARE) ×2 IMPLANT
TAPE CLOTH SURG 4X10 WHT LF (GAUZE/BANDAGES/DRESSINGS) ×2 IMPLANT
TOWEL OR 17X24 6PK STRL BLUE (TOWEL DISPOSABLE) ×2 IMPLANT
TOWEL OR 17X26 10 PK STRL BLUE (TOWEL DISPOSABLE) ×2 IMPLANT
TRAY FOLEY IC TEMP SENS 14FR (CATHETERS) IMPLANT
TUBE ANAEROBIC SPECIMEN COL (MISCELLANEOUS) IMPLANT
TUBE CONNECTING 12X1/4 (SUCTIONS) ×2 IMPLANT
WATER STERILE IRR 1000ML POUR (IV SOLUTION) ×2 IMPLANT

## 2013-03-24 NOTE — Brief Op Note (Signed)
03/24/2013  11:24 AM  PATIENT:  Rodney Powers  57 y.o. male  PRE-OPERATIVE DIAGNOSIS:  mediastinal mass  POST-OPERATIVE DIAGNOSIS:  mediastinal mass  PROCEDURE:  Procedure(s): MEDIASTINOTOMY CHAMBERLAIN MCNEIL (Left)  SURGEON:  Surgeon(s) and Role:    * Loreli Slot, MD - Primary  ANESTHESIA:   local and general  EBL:  Total I/O In: 1500 [I.V.:1500] Out: -   BLOOD ADMINISTERED:none  DRAINS: 45F Chest Tube(s) in the left pleura   LOCAL MEDICATIONS USED:  MARCAINE    and Amount: 10 ml  SPECIMEN:  Source of Specimen:  mediastinal mass  DISPOSITION OF SPECIMEN:  PATHOLOGY  COUNTS:  YES  PLAN OF CARE: Admit for overnight observation  PATIENT DISPOSITION:  PACU - hemodynamically stable.   Delay start of Pharmacological VTE agent (>24hrs) due to surgical blood loss or risk of bleeding: not applicable

## 2013-03-24 NOTE — Preoperative (Signed)
Beta Blockers   Reason not to administer Beta Blockers:Not Applicable 

## 2013-03-24 NOTE — Anesthesia Postprocedure Evaluation (Signed)
Anesthesia Post Note  Patient: Rodney Powers  Procedure(s) Performed: Procedure(s) (LRB): MEDIASTINOTOMY CHAMBERLAIN MCNEIL (Left)  Anesthesia type: general  Patient location: PACU  Post pain: Pain level controlled  Post assessment: Patient's Cardiovascular Status Stable  Last Vitals:  Filed Vitals:   03/24/13 1215  BP: 120/75  Pulse: 78  Temp:   Resp: 17    Post vital signs: Reviewed and stable  Level of consciousness: sedated  Complications: No apparent anesthesia complications

## 2013-03-24 NOTE — Anesthesia Procedure Notes (Signed)
Procedure Name: Intubation Date/Time: 03/24/2013 8:24 AM Performed by: Sherie Don Pre-anesthesia Checklist: Patient identified, Emergency Drugs available, Suction available, Patient being monitored and Timeout performed Patient Re-evaluated:Patient Re-evaluated prior to inductionOxygen Delivery Method: Circle system utilized Preoxygenation: Pre-oxygenation with 100% oxygen Intubation Type: IV induction Ventilation: Mask ventilation without difficulty Laryngoscope Size: Mac and 3 Grade View: Grade II Tube type: Oral Tube size: 7.5 mm Number of attempts: 1 Airway Equipment and Method: Stylet Placement Confirmation: ETT inserted through vocal cords under direct vision,  positive ETCO2 and breath sounds checked- equal and bilateral Secured at: 24 cm Tube secured with: Tape Dental Injury: Teeth and Oropharynx as per pre-operative assessment

## 2013-03-24 NOTE — Anesthesia Preprocedure Evaluation (Signed)
Anesthesia Evaluation  Patient identified by MRN, date of birth, ID band Patient awake    Reviewed: Allergy & Precautions, H&P , NPO status , Patient's Chart, lab work & pertinent test results, reviewed documented beta blocker date and time   Airway Mallampati: II TM Distance: >3 FB Neck ROM: full    Dental  (+) Poor Dentition   Pulmonary pneumonia -, resolved, COPD COPD inhaler, Current Smoker,  breath sounds clear to auscultation        Cardiovascular negative cardio ROS  Rhythm:regular     Neuro/Psych negative neurological ROS  negative psych ROS   GI/Hepatic negative GI ROS, Neg liver ROS, (+)     substance abuse  alcohol use,   Endo/Other  diabetes  Renal/GU Renal disease  negative genitourinary   Musculoskeletal   Abdominal   Peds  Hematology  (+) anemia ,   Anesthesia Other Findings See surgeon's H&P   Reproductive/Obstetrics negative OB ROS                           Anesthesia Physical Anesthesia Plan  ASA: III  Anesthesia Plan: General   Post-op Pain Management:    Induction: Intravenous  Airway Management Planned: Oral ETT  Additional Equipment: Arterial line  Intra-op Plan:   Post-operative Plan: Extubation in OR  Informed Consent: I have reviewed the patients History and Physical, chart, labs and discussed the procedure including the risks, benefits and alternatives for the proposed anesthesia with the patient or authorized representative who has indicated his/her understanding and acceptance.   Dental Advisory Given  Plan Discussed with: CRNA and Surgeon  Anesthesia Plan Comments:         Anesthesia Quick Evaluation

## 2013-03-24 NOTE — Transfer of Care (Signed)
Immediate Anesthesia Transfer of Care Note  Patient: Rodney Powers  Procedure(s) Performed: Procedure(s): MEDIASTINOTOMY CHAMBERLAIN MCNEIL (Left)  Patient Location: PACU  Anesthesia Type:General  Level of Consciousness: sedated and patient cooperative  Airway & Oxygen Therapy: Patient Spontanous Breathing and Patient connected to face mask oxygen  Post-op Assessment: Report given to PACU RN, Post -op Vital signs reviewed and stable and Patient moving all extremities X 4  Post vital signs: Reviewed and stable  Complications: No apparent anesthesia complications

## 2013-03-24 NOTE — Interval H&P Note (Signed)
History and Physical Interval Note:  03/24/2013 7:56 AM  Rodney Powers  has presented today for surgery, with the diagnosis of mediastinal mass  The various methods of treatment have been discussed with the patient and family. After consideration of risks, benefits and other options for treatment, the patient has consented to  Procedure(s): MEDIASTINOTOMY CHAMBERLAIN MCNEIL (Left) as a surgical intervention .  The patient's history has been reviewed, patient examined, no change in status, stable for surgery.  I have reviewed the patient's chart and labs.  Questions were answered to the patient's satisfaction.     [name] C

## 2013-03-24 NOTE — H&P (View-Only) (Signed)
PCP is Default, Provider, MD Referring Provider is Exie Parody, MD  Chief Complaint  Patient presents with  . Lung Mass    surgical eval on lung mass, PET Scan 03/15/2013    HPI: 57 year old presents with chief complaint of difficulty swallowing.  Rodney Powers is a 57 year old gentleman who says that he felt well up until about 3- 4 weeks ago. Around that time he noticed that when he ate he had the sensation of food sticking in his chest and a little while later would throw up. The food would not be mixed with any gastric contents, that would just be the food that he had eaten. Over time this actually has improved and he now can swallow, but he "wants to know where the food is going" because he only had 2 bowel movements in the past 3 weeks. He also with has been experiencing some shortness of breath with exertion, but he has not had any chest pain. He does feel cold all the time, but denies any fevers, sweats, or shaking chills. He did have a cough which was productive, but has gotten better. He has not had any hemoptysis. He has experienced a 25 pound weight loss over the past 4 weeks. He has a 60-pack-year history of smoking.  His initial complaints led to a chest x-ray on 03/06/13 which showed a left hilar mass. A CT showed a large aortopulmonary window mass and some slightly enlarged mediastinal lymph nodes. He then had a bronchoscopy and endobronchial ultrasound, this was nondiagnostic. He socially had a PET/CT which showed the aortopulmonary window mass and mediastinal nodes to be hypermetabolic. There was also a focus of hypermetabolism noted in the hypopharynx.   Past Medical History  Diagnosis Date  . Back pain, chronic   . COPD (chronic obstructive pulmonary disease)   . Diabetes mellitus, type II     Past Surgical History  Procedure Laterality Date  . Video bronchoscopy with endobronchial ultrasound Bilateral 03/10/2013    Procedure: VIDEO BRONCHOSCOPY WITH ENDOBRONCHIAL ULTRASOUND;   Surgeon: Oretha Milch, MD;  Location: [facility] OR;  Service: Pulmonary;  Laterality: Bilateral;    Family History  Problem Relation Age of Onset  . Aneurysm Mother   . Heart attack Father   . Aneurysm Sister     Social History History  Substance Use Topics  . Smoking status: Current Every Day Smoker -- 1.50 packs/day for 40 years    Types: Cigarettes    Start date: 07/18/1971  . Smokeless tobacco: Not on file  . Alcohol Use: 3.6 oz/week    6 Cans of beer per week     Comment: 4-5 days ago Pt. reports having a couple of beers    Current Outpatient Prescriptions  Medication Sig Dispense Refill  . glimepiride (AMARYL) 1 MG tablet Take 1 tablet (1 mg total) by mouth daily before breakfast.  30 tablet  3  . HYDROcodone-acetaminophen (NORCO) 5-325 MG per tablet Take 1 tablet by mouth every 6 (six) hours as needed for pain.  90 tablet  0  . levofloxacin (LEVAQUIN) 750 MG tablet Take 1 tablet (750 mg total) by mouth daily. X 7 DAYS  7 tablet  0  . Multiple Vitamin (MULTIVITAMIN WITH MINERALS) TABS Take 1 tablet by mouth daily.  30 tablet  3  . predniSONE (STERAPRED UNI-PAK) 10 MG tablet Prednisone dosing: Take prednsone 30mg  (3 tabs) x 3 days, then 20mg  (2 tabs) x 3days, then 10mg  (1 tab) x 3days, then OFF.  Dispense:  18 tabs, refills: None  18 tablet  0  . tamsulosin (FLOMAX) 0.4 MG CAPS Take 1 capsule (0.4 mg total) by mouth daily after supper.  30 capsule  3   No current facility-administered medications for this visit.    No Known Allergies  Review of Systems  Constitutional: Positive for chills, appetite change and unexpected weight change (lost 25 pounds in 4 weeks). Negative for fever.  Respiratory: Positive for cough (productive at first now decreasing) and shortness of breath (with exertion). Negative for wheezing and stridor.   Cardiovascular: Negative for chest pain and leg swelling.  Gastrointestinal: Positive for vomiting, abdominal pain, constipation, blood in stool (tarry  stools) and abdominal distention.       Difficulty swallowing  Genitourinary: Positive for dysuria, urgency and frequency.    BP 130/80  Pulse 120  Resp 20  Ht 6\' 3"  (1.905 m)  Wt 210 lb (95.255 kg)  BMI 26.25 kg/m2  SpO2 96% Physical Exam  Vitals reviewed. Constitutional: He is oriented to person, place, and time. He appears well-developed and well-nourished. No distress.  HENT:  Soft tissue mass right forehead- soft and mobile  Eyes: EOM are normal. Pupils are equal, round, and reactive to light.  Neck: Neck supple. No thyromegaly present.  Cardiovascular: Normal rate, regular rhythm, normal heart sounds and intact distal pulses.   Pulmonary/Chest: Effort normal and breath sounds normal. He has no wheezes. He has no rales.  Abdominal: He exhibits distension. There is no tenderness.  Musculoskeletal: Normal range of motion. He exhibits no edema.  Lymphadenopathy:    He has no cervical adenopathy.  Neurological: He is alert and oriented to person, place, and time. No cranial nerve deficit.  Skin: Skin is warm and dry.     Diagnostic Tests: CT of chest 03/06/2013. *RADIOLOGY REPORT*  Clinical Data: Mass noted on x-ray  CT CHEST WITH CONTRAST  Technique: Multidetector CT imaging of the chest was performed  following the standard protocol during bolus administration of  intravenous contrast.  Contrast: 80mL OMNIPAQUE IOHEXOL 300 MG/ML SOLN  Comparison: Chest x-ray same day  Findings: Sagittal images of the spine are unremarkable. The  images of the thoracic inlet are unremarkable. Central airways are  patent. As noted on chest x-rays there is a nodular mass or  confluent adenopathy in the left mediastinum extending in the left  upper lobe suprahilar region. There is low density centrally  suspicious for necrosis the lesion measures at least 6.5 x 5.5 cm  highly suspicious for malignancy. Correlation with bronchoscopy  and/or biopsy is recommended.  A AP window lymph node  measures 1.4 x 1.5 cm. Right pretracheal  lymph node measures 1.6 x 1.3 cm. Left hilar lymph node measures 2  x 1.9 cm. No right hilar adenopathy is noted.  Images of the lung parenchyma shows bilateral interstitial  prominence. There is interstitial thickening with patchy airspace  disease in the left upper lobe anteriorly suspicious for  postobstructive pneumonia. Large paraseptal emphysematous bullae  are noted in the upper lobes bilaterally.  Small hiatal hernia. There is a low density lesion in the right  hepatic lobe laterally measures 1.7 cm. Second low density lesion  in the right hepatic lobe measures 2.2 cm. These are highly  suspicious for metastatic disease. Further evaluation is  recommended. No adrenal gland mass is noted.  Bilateral nonspecific axillary lymph nodes are noted.  IMPRESSION:  1. There is a mass or confluent adenopathy in the left  mediastinum//suprahilar region measures 6.5  x 5.5 cm. This is  highly suspicious for malignancy. Further evaluation with  bronchoscopy and/or biopsy is recommended.  2. There are pathologic mediastinal and left hilar lymph nodes.  3. Airspace disease in the left upper lobe anteriorly suspicious  for postobstructive pneumonia.  4. Extensive emphysematous changes bilateral upper lobe.  5. Low density lesions are noted in the right hepatic lobe  suspicious for metastatic disease. Further evaluation is  recommended.  Critical findings discussed with Dr. Ethelda Chick  Original Report Authenticated By: Natasha Mead, M.D.  PET/CT for 114 NUCLEAR MEDICINE PET SKULL BASE TO THIGH  Fasting Blood Glucose: 94  Technique: 18 mCi F-18 FDG was injected intravenously. CT data was  obtained and used for attenuation correction and anatomic  localization only. (This was not acquired as a diagnostic CT  examination.) Additional exam technical data entered on  technologist worksheet.  Comparison: 10/13/2011  Findings:  Neck: Within the right side  of the hypopharynx there is abnormal  soft tissue thickening with associated increased FDG uptake. This  measures 1.6 x 2.1 cm and has an SUV max equal to 9.6, image 33.  No hypermetabolic lymph nodes identified within the neck.  Chest: Large prevascular mass is identified extending into the  left upper lobe. This measures 5 x 6.2 by 3.9 cm. The SUV max  associated this mass is equal to 27. Hypermetabolic left hilar  lymph node has an SUV max equal to 6.8, image 92. There is an AP  window lymph node which is hypermetabolic. This measures 1.3 cm  and has an SUV max equal to 6.30, image 87. There is no pleural  effusion identified. Advanced changes of centrilobular and  paraseptal emphysema identified. Pulmonary nodule within the  posterior left upper lobe measures 7 mm. The SUV max associated  this nodule is equal to 2.5.  Abdomen/Pelvis: Within the inferior right hepatic lobe there is a  1.8 cm hypodense structure, image 143. There is no abnormal FDG  uptake associated with this structure. The pancreas and the spleen  both appear normal. The adrenal glands are unremarkable. The  prostate gland is enlarged and there is diffuse bladder wall  thickening suggesting chronic bladder outlet obstruction. There is  a focal area of increased uptake within the central aspect of the  posterior prostate gland. This is nonspecific with an SUV max  equal to 6.5.  Skeleton: No focal hypermetabolic activity to suggest skeletal  metastasis.  IMPRESSION:  1. There is a large hypermetabolic mass centered over the  prevascular lymph node region and extending into the left upper  lobe. This is concerning for bronchogenic carcinoma.  2. Hypermetabolic left hilar and AP window lymph nodes which are  worrisome for metastatic adenopathy.  3. Left upper lobe pulmonary nodule exhibits malignant range FDG  uptake within SUV max equal to 2.5.  4. Within the right side of the hypopharynx there is a soft tissue   attenuating lesion which exhibits intense FDG uptake. Cannot rule  out primary head neck carcinoma. Correlation with direct  visualization is advised. Additionally, contrast-enhanced CT of  the neck may be helpful for further characterization.  Original Report Authenticated By: Signa Kell, M.D.  Impression: Rodney Powers is a 57 year old smoker who presents with dysphagia, shortness of breath, and weight loss over the past month. He has a large aortopulmonary window mass as well as some mildly enlarged external lymph nodes. Bronchoscopy and endobronchial ultrasound were nondiagnostic. The mass is hypermetabolic by PET and remains highly suspicious for  malignancy. A tissue diagnosis is a must in order to guide appropriate treatment.  I recommended to Mr. Niemann that we proceed with a left anterior mediastinotomy Lula Olszewski procedure). I described the procedure to him including the need for general anesthesia and the incision to be used. We discussed the indications, risks, benefits, and alternatives. He understands the risks include, but are not limited to, death, MI, DVT, PE, bleeding, infection, pneumothorax, nondiagnostic biopsies, as well as potential for unforeseeable complications.  He accepts these risks and wishes to proceed.  Plan: Left anterior mediastinotomy Lula Olszewski procedure) on Thursday, 03/24/2013. Hopefully we can do this on an outpatient basis

## 2013-03-25 ENCOUNTER — Observation Stay (HOSPITAL_COMMUNITY): Payer: Medicaid Other

## 2013-03-25 ENCOUNTER — Encounter (HOSPITAL_COMMUNITY): Payer: Self-pay | Admitting: Thoracic Surgery (Cardiothoracic Vascular Surgery)

## 2013-03-25 LAB — GLUCOSE, CAPILLARY: Glucose-Capillary: 142 mg/dL — ABNORMAL HIGH (ref 70–99)

## 2013-03-25 LAB — BLOOD GAS, ARTERIAL
Bicarbonate: 24.4 mEq/L — ABNORMAL HIGH (ref 20.0–24.0)
O2 Saturation: 95.1 %
pH, Arterial: 7.428 (ref 7.350–7.450)
pO2, Arterial: 71.2 mmHg — ABNORMAL LOW (ref 80.0–100.0)

## 2013-03-25 LAB — CBC
HCT: 30.9 % — ABNORMAL LOW (ref 39.0–52.0)
Hemoglobin: 10.2 g/dL — ABNORMAL LOW (ref 13.0–17.0)
MCHC: 33 g/dL (ref 30.0–36.0)

## 2013-03-25 LAB — BASIC METABOLIC PANEL
CO2: 26 mEq/L (ref 19–32)
Chloride: 100 mEq/L (ref 96–112)
Sodium: 132 mEq/L — ABNORMAL LOW (ref 135–145)

## 2013-03-25 NOTE — Op Note (Signed)
NAMELAWERENCE, DERY            ACCOUNT NO.:  [id number]  MEDICAL RECORD NO.:  [id number]  LOCATION:  3309                         FACILITY:  [facility]  PHYSICIAN:  Salvatore Decent. Dorris Fetch, M.D.DATE OF BIRTH:  September 25, 1956  DATE OF PROCEDURE:  03/24/2013 DATE OF DISCHARGE:                              OPERATIVE REPORT   PREOPERATIVE DIAGNOSIS:  Mediastinal and left hilar mass.  POSTOPERATIVE DIAGNOSIS:  Mediastinal and left hilar mass.  PROCEDURE:  Left anterior mediastinotomy Lula Olszewski procedure).  SURGEON:  Salvatore Decent. Dorris Fetch, M.D.  ASSISTANT: none  ANESTHESIA:  General.  FINDINGS:  Firm nodular tissue within the mediastinal fat sent to frozen section.  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  Additional frozen section of dense tissue at the left hilum revealed fibrosis but no evidence of malignancy.  Multiple additional samples sent for pathology.  CLINICAL NOTE:  [name] is a 57 year old gentleman, who presented initially with some dysphagia.  Workup revealed a left hilar mass.  On CT, he was found to have a large aortopulmonary window mass and slightly enlarged mediastinal lymph nodes.  Bronchoscopy and endobronchial ultrasound were nondiagnostic.  The PET/CT showed the lesions to be hypermetabolic.  He also was noted to have a focus of hypermetabolism in his hypopharynx which will need to be addressed at a later time.  The patient was advised to undergo a left anterior mediastinotomy for direct biopsy of the lesion in order to obtain enough tissue to differentiate thymoma and lymphoma versus other sources of carcinoma such as primary lung lesion.  Indications, risks, benefits, and alternatives were discussed in detail with the patient.  He understood and accepted the risks and agreed to proceed.  OPERATIVE NOTE:  Mr. Mages was brought to the preoperative holding area on March 24, 2012.  There, intravenous access was established and arterial blood pressure monitoring line was placed.  Intravenous antibiotics were given.  He was taken to the operating room, anesthetized, and intubated.  The chest was prepped and draped in usual sterile fashion.  A transverse incision was made in the third intercostal space just left of midline.  It was carried through the skin and subcutaneous tissue. The pectoralis muscular fibers were separated.  The intercostal muscles were divided.  As expected based on the CT findings, immediately on dividing the intercostal muscles, we were within the left pleural space. A finger was placed into the incision and the mass was palpated.  The bulk of the mass was superior to the incision, but directly below the incision, there was firmer masslike density diffusely within the mediastinal fat pad.  This was dissected off.  It was highly vascular and bled in several places.  Care was taken at this point to avoid the phrenic nerve which could not be visualized at anytime during the procedure.  As this dissection was carried more posteriorly, it was clear that there was invasion into involvement of the lung as well as the mediastinum but unclear where the original source of the tumor was. The initial specimens were sent for frozen section.  While awaiting that, additional biopsies were taken more superiorly in the primary component of the  mass.  These will all be sent for permanent sections. The frozen section subsequently returned as involuted thymic tissue but no evidence of malignancy.  At this point, a 15-blade scalpel was used to excise an area of the dense tissue between the mediastinum and the lung and this was sent for frozen section.  While awaiting that results, Tru-Cut biopsies were taken in the primary mass.  These were labeled left hilar mass and were sent for permanent pathology only.  The second frozen section results subsequently  returned with dense fibrosis but no viable tumor was seen.  A final inspection was made for hemostasis.  The remaining specimens were sent for permanent pathology.  A 28-French chest tube was placed in the anterior left chest wall and secured with a #1 silk suture.  The pectoralis fascia was closed with running #1 Vicryl suture.  The subcutaneous tissue and skin were closed in standard fashion.  Dermabond was applied to the wound.  The chest tube was placed to suction.  The patient was extubated in the operating room and taken to the postanesthetic care unit in good condition.     Salvatore Decent Dorris Fetch, M.D.     SCH/MEDQ  D:  03/24/2013  T:  03/25/2013  Job:  [id number]

## 2013-03-25 NOTE — Progress Notes (Signed)
Utilization review completed.  

## 2013-03-25 NOTE — Progress Notes (Signed)
Pt CT d/c per MD order, pt tol well, pt educated to call nursing for SOB, CP, or feeling different, pt verbalized understanding

## 2013-03-25 NOTE — Progress Notes (Signed)
SATURATION QUALIFICATIONS: (This note is used to comply with regulatory documentation for home oxygen)  Patient Saturations on Room Air at Rest = 84%  Patient Saturations on Room Air while Ambulating = 80%  Patient Saturations on 2 Liters of oxygen while Ambulating = 93%  Please briefly explain why patient needs home oxygen: pt has COPD hx, s/p mediastinotomy

## 2013-03-25 NOTE — Discharge Summary (Signed)
[address]       [place] [address]             [phone]     Rodney Powers 1955/12/16 57 y.o. [id number]  03/24/2013   Loreli Slot, MD  mediastinal mass HPI: at time of consultation Mr. Burgen is a 57 year old gentleman who says that he felt well up until about 3- 4 weeks ago. Around that time he noticed that when he ate he had the sensation of food sticking in his chest and a little while later would throw up. The food would not be mixed with any gastric contents, that would just be the food that he had eaten. Over time this actually has improved and he now can swallow, but he "wants to know where the food is going" because he only had 2 bowel movements in the past 3 weeks. He also with has been experiencing some shortness of breath with exertion, but he has not had any chest pain. He does feel cold all the time, but denies any fevers, sweats, or shaking chills. He did have a cough which was productive, but has gotten better. He has not had any hemoptysis. He has experienced a 25 pound weight loss over the past 4 weeks. He has a 60-pack-year history of smoking.  His initial complaints led to a chest x-ray on 03/06/13 which showed a left hilar mass. A CT showed a large aortopulmonary window mass and some slightly enlarged mediastinal lymph nodes. He then had a bronchoscopy and endobronchial ultrasound, this was nondiagnostic. He then  had a PET/CT which showed the aortopulmonary window mass and mediastinal nodes to be hypermetabolic. There was also a focus of hypermetabolism noted in the hypopharynx. Due to these findings recommendations were made to proceed with left anterior mediastinotomy and the patient was admitted for the procedure.  Past Medical History   Diagnosis  Date   .  Back pain, chronic    .  COPD (chronic obstructive pulmonary disease)    .  Diabetes mellitus, type II     Past Surgical History   Procedure  Laterality  Date   .  Video  bronchoscopy with endobronchial ultrasound  Bilateral  03/10/2013     Procedure: VIDEO BRONCHOSCOPY WITH ENDOBRONCHIAL ULTRASOUND; Surgeon: Oretha Milch, MD; Location: [facility] OR; Service: Pulmonary; Laterality: Bilateral;    Family History   Problem  Relation  Age of Onset   .  Aneurysm  Mother    .  Heart attack  Father    .  Aneurysm  Sister     Social History  History   Substance Use Topics   .  Smoking status:  Current Every Day Smoker -- 1.50 packs/day for 40 years     Types:  Cigarettes     Start date:  07/18/1971   .  Smokeless tobacco:  Not on file   .  Alcohol Use:  3.6 oz/week     6 Cans of beer per week      Comment: 4-5 days ago Pt. reports having a couple of beers    Current Outpatient Prescriptions   Medication  Sig  Dispense  Refill   .  glimepiride (AMARYL) 1 MG tablet  Take 1 tablet (1 mg total) by mouth daily before breakfast.  30 tablet  3   .  HYDROcodone-acetaminophen (NORCO) 5-325 MG per tablet  Take 1 tablet by mouth every 6 (six) hours as needed for pain.  90 tablet  0   .  levofloxacin (LEVAQUIN) 750 MG tablet  Take 1 tablet (750 mg total) by mouth daily. X 7 DAYS  7 tablet  0   .  Multiple Vitamin (MULTIVITAMIN WITH MINERALS) TABS  Take 1 tablet by mouth daily.  30 tablet  3   .  predniSONE (STERAPRED UNI-PAK) 10 MG tablet  Prednisone dosing: Take prednsone 30mg  (3 tabs) x 3 days, then 20mg  (2 tabs) x 3days, then 10mg  (1 tab) x 3days, then OFF.  Dispense: 18 tabs, refills: None  18 tablet  0   .  tamsulosin (FLOMAX) 0.4 MG CAPS  Take 1 capsule (0.4 mg total) by mouth daily after supper.  30 capsule  3    No current facility-administered medications for this visit.    No Known Allergies  Hospital Course:  The patient was admitted to the hospital and taken to the operating room on 03/24/2013 and underwent Procedure(s): OPERATIVE REPORT  PREOPERATIVE DIAGNOSIS: Mediastinal and left hilar mass.  POSTOPERATIVE DIAGNOSIS: Mediastinal and left hilar mass.    PROCEDURE: Left anterior mediastinotomy Lula Olszewski procedure).  SURGEON: Salvatore Decent. Dorris Fetch, M.D.  ASSISTANT: none  ANESTHESIA: General.  FINDINGS: Firm nodular tissue within the mediastinal fat sent to frozen  section. 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 Additional  frozen section of dense tissue at the left hilum revealed fibrosis but  no evidence of malignancy. Multiple additional samples sent for  pathology.  Postoperative hospital course: Patient has done well . He remained hemodynamically stable and was able to be weaned off oxygen. He tolerated ambulation and diet. All routine lines, monitors and drainage devices were discontinued in the standard fashion on postoperative day #1. He was felt to be stable for discharge on postoperative day #1. 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 Further planning would be based on MR of the brain and final results of pathology.     Recent Labs  03/23/13 1229 03/25/13 0516  NA 136 132*  K 3.5 3.9  CL 103 100  CO2 22 26  GLUCOSE 129* 154*  BUN 16 11  CALCIUM 9.5 8.9    Recent Labs  03/23/13 1229 03/25/13 0516  WBC 12.5* 11.9*  HGB 11.4* 10.2*  HCT 34.6* 30.9*  PLT 309 278    Recent Labs  03/23/13 1229  INR 1.01     Discharge Instructions:  The patient is discharged to home with extensive instructions on wound care and progressive ambulation.  They are instructed not to drive or perform any heavy lifting until returning to see the physician in his office.  Discharge Diagnosis:  mediastinal mass  Secondary Diagnosis: Patient Active Problem List  Diagnosis  . PNA (pneumonia)  . Hyponatremia  . AKI (acute kidney injury)  . Protein-calorie malnutrition, moderate  . Hyperglycemia  . Leukocytosis  . Mass of lung  . ETOH abuse   Past Medical History  Diagnosis Date  . Back pain, chronic   . Diabetes  mellitus, type II   . Mediastinal mass   . Anemia   . Numbness and tingling in hands   . Numbness and tingling of foot     bilateral  . Urinary retention   . COPD (chronic obstructive pulmonary disease)         Medication List    STOP taking these medications       levofloxacin 750 MG tablet  Commonly known as:  LEVAQUIN  TAKE these medications       glimepiride 1 MG tablet  Commonly known as:  AMARYL  Take 1 tablet (1 mg total) by mouth daily before breakfast.     HYDROcodone-acetaminophen 5-325 MG per tablet  Commonly known as:  NORCO  Take 1 tablet by mouth every 6 (six) hours as needed for pain.     multivitamin with minerals Tabs  Take 1 tablet by mouth daily.     tamsulosin 0.4 MG Caps  Commonly known as:  FLOMAX  Take 1 capsule (0.4 mg total) by mouth daily after supper.       Follow-up Information   Follow up with HENDRICKSON,STEVEN C, MD. (next tues.- office will contact you)    Contact information:   [address] [place] [address] [phone]      Disposition: Discharged home  Patient's condition is Harlin Heys, PA-C 03/25/2013  8:06 AM

## 2013-03-25 NOTE — Progress Notes (Signed)
[facility] DAILY PROGRESS NOTE                   [address]            [place] [address]          [phone]      1 Day Post-Op Procedure(s) (LRB): MEDIASTINOTOMY CHAMBERLAIN MCNEIL (Left)  Total Length of Stay:  LOS: 1 day   Subjective: Feels some pain  Objective: Vital signs in last 24 hours: Temp:  [97.3 F (36.3 C)-98.6 F (37 C)] 98.4 F (36.9 C) (04/11 0733) Pulse Rate:  [72-102] 77 (04/11 0500) Cardiac Rhythm:  [-] Normal sinus rhythm (04/10 2000) Resp:  [17-26] 19 (04/11 0500) BP: (110-134)/(63-84) 127/84 mmHg (04/11 0400) SpO2:  [90 %-100 %] 95 % (04/11 0500) Arterial Line BP: (83-156)/(49-79) 109/49 mmHg (04/11 0500) FiO2 (%):  [100 %] 100 % (04/10 1109)  There were no vitals filed for this visit.  Weight change:    Hemodynamic parameters for last 24 hours:    Intake/Output from previous day: 04/10 0701 - 04/11 0700 In: 2790 [P.O.:1040; I.V.:1700; IV Piggyback:50] Out: 1250 [Urine:1250]  Intake/Output this shift:    Current Meds: Scheduled Meds: . acetaminophen  1,000 mg Intravenous Q6H  . bisacodyl  10 mg Oral Daily  . glimepiride  1 mg Oral QAC breakfast  . insulin aspart  0-24 Units Subcutaneous TID AC & HS  . tamsulosin  0.4 mg Oral QPC supper   Continuous Infusions:  PRN Meds:.albuterol, ondansetron (ZOFRAN) IV, oxyCODONE, oxyCODONE-acetaminophen, oxyCODONE-acetaminophen, potassium chloride, zolpidem  General appearance: alert, cooperative and no distress Heart: regular rate and rhythm Lungs: clear to auscultation bilaterally Abdomen: benign Extremities: no edema Wound: incision without bleeding or erethema  Lab Results: CBC: Recent Labs  03/23/13 1229 03/25/13 0516  WBC 12.5* 11.9*  HGB 11.4* 10.2*  HCT 34.6* 30.9*  PLT 309 278   BMET:  Recent Labs  03/23/13 1229 03/25/13 0516  NA 136 132*  K 3.5 3.9  CL 103 100  CO2 22 26  GLUCOSE 129* 154*  BUN 16 11  CREATININE 0.92 0.96  CALCIUM 9.5 8.9      PT/INR:  Recent Labs  03/23/13 1229  LABPROT 13.2  INR 1.01   Radiology: Dg Chest 2 View  03/23/2013  *RADIOLOGY REPORT*  Clinical Data: Preop mediastinotomy  CHEST - 2 VIEW  Comparison: PET CT dated 03/15/2013.  Findings: Left suprahilar/mediastinal mass.  Lungs are clear. No pleural effusion or pneumothorax.  The heart is normal in size.  Mild degenerative changes of the visualized thoracolumbar spine.  IMPRESSION: Left suprahilar/mediastinal mass.   Original Report Authenticated By: Charline Bills, M.D.    Dg Chest Portable 1 View  03/24/2013  *RADIOLOGY REPORT*  Clinical Data: Mediastinal mass and postop.  PORTABLE CHEST - 1 VIEW  Comparison: 03/23/2013  Findings: Left chest tube tip at the apex.  There may be a trace amount of left apical air but no significant pneumothorax.  Diffuse interstitial densities throughout the left hemithorax probably related to volume loss and mild edema.  There are prominent interstitial densities in the right lower lung.  Stable densities along the left side of mediastinum.  IMPRESSION: Postoperative changes with a left chest tube.  Question a tiny left apical pneumothorax.  Prominent interstitial densities are suggestive for atelectasis and/or mild edema.   Original Report Authenticated By: Richarda Overlie, M.D.    Chest tube no drainage or air leak  Assessment/Plan: S/P Procedure(s) (LRB): MEDIASTINOTOMY  CHAMBERLAIN MCNEIL (Left)  1 doing well 2 d/c chest tube 3 d/c aline 4 wean O2, if unable arrange for home O2 5 d/c home    Garron Eline E 03/25/2013 7:48 AM

## 2013-03-25 NOTE — Progress Notes (Signed)
Pt d/c home per MD order, PCXR done results read to PA, pt VSS, pt verbalized understanding of d/c instructions, family at [facility], all questions answered

## 2013-03-28 ENCOUNTER — Other Ambulatory Visit: Payer: Self-pay | Admitting: *Deleted

## 2013-03-28 DIAGNOSIS — R918 Other nonspecific abnormal finding of lung field: Secondary | ICD-10-CM

## 2013-03-29 ENCOUNTER — Other Ambulatory Visit: Payer: Self-pay

## 2013-03-29 ENCOUNTER — Encounter: Payer: Self-pay | Admitting: Thoracic Surgery (Cardiothoracic Vascular Surgery)

## 2013-03-29 ENCOUNTER — Ambulatory Visit (INDEPENDENT_AMBULATORY_CARE_PROVIDER_SITE_OTHER): Payer: Self-pay | Admitting: Thoracic Surgery (Cardiothoracic Vascular Surgery)

## 2013-03-29 VITALS — BP 126/81 | HR 96 | Resp 20 | Ht 75.0 in | Wt 209.0 lb

## 2013-03-29 DIAGNOSIS — J9859 Other diseases of mediastinum, not elsewhere classified: Secondary | ICD-10-CM

## 2013-03-29 DIAGNOSIS — R222 Localized swelling, mass and lump, trunk: Secondary | ICD-10-CM

## 2013-03-29 DIAGNOSIS — R918 Other nonspecific abnormal finding of lung field: Secondary | ICD-10-CM

## 2013-03-29 DIAGNOSIS — R51 Headache: Secondary | ICD-10-CM

## 2013-03-29 DIAGNOSIS — Z9889 Other specified postprocedural states: Secondary | ICD-10-CM

## 2013-03-29 NOTE — Progress Notes (Signed)
  HPI:  [name] is a 57 year old gentleman who had presented with a left hilar mass. He underwent a left anterior mediastinotomy for biopsy last week. We did place a chest tube afterwards and he stayed overnight. He had an intense inflammatory reaction around the tumor most of the biopsies were sent off were mediastinal fat and fibrous tissue. However on a few of the deeper biopsies there was a poorly differentiated carcinoma.  Past Medical History  Diagnosis Date  . Back pain, chronic   . Diabetes mellitus, type II   . Mediastinal mass   . Anemia   . Numbness and tingling in hands   . Numbness and tingling of foot     bilateral  . Urinary retention   . COPD (chronic obstructive pulmonary disease)       Current Outpatient Prescriptions  Medication Sig Dispense Refill  . glimepiride (AMARYL) 1 MG tablet Take 1 tablet (1 mg total) by mouth daily before breakfast.  30 tablet  3  . HYDROcodone-acetaminophen (NORCO) 5-325 MG per tablet Take 1 tablet by mouth every 6 (six) hours as needed for pain.  90 tablet  0  . Multiple Vitamin (MULTIVITAMIN WITH MINERALS) TABS Take 1 tablet by mouth daily.  30 tablet  3  . tamsulosin (FLOMAX) 0.4 MG CAPS Take 1 capsule (0.4 mg total) by mouth daily after supper.  30 capsule  3   No current facility-administered medications for this visit.    Physical Exam BP 126/81  Pulse 96  Resp 20  Ht 6\' 3"  (1.905 m)  Wt 209 lb (94.802 kg)  BMI 26.12 kg/m2  SpO77 34% 56 year old male in no acute distress Incisions clean dry and intact  Diagnostic Tests: Chest x-ray 03/25/13 PORTABLE CHEST - 1 VIEW  Comparison: 03/25/2013.  Findings: Trachea is midline. Heart size stable. AP window mass  is again noted. Left chest tube has been removed. Suspect tiny  left apical pneumothorax. Tiny left pleural effusion. Improving  bibasilar aeration.  IMPRESSION:  1. Suspect tiny left apical pneumothorax after left chest tube  removal.  2. AP window mass.  3.  Improving bibasilar aeration without complete resolution of air  space disease.  4. Tiny left pleural effusion.  Impression: 57 year old male with a left hilar mass. This is a poorly differential into carcinoma which is involving both the lung and mediastinum. I suspect is a lung primary with invasion of the mediastinum (T4). There is a small possibility that this could be primary mediastinal tumor such as a thymic carcinoma with invasion of the lung. In either case the prognosis is not favorable. We are awaiting final immunostains to differentiate the tumor.  In the meantime he does need an MR of the brain to rule out metastases  We will need to arrange for him to have an appointment in the multidisciplinary thoracic oncology clinic, that'll likely be next week. In the meantime if his pathology returns with any further information I will give him a call. Mostly it is set him up to see me in the office next Tuesday pending final pathology results and establishing an alternate appointment.  Plan: MR brain  Return to office next Tuesday

## 2013-03-31 ENCOUNTER — Inpatient Hospital Stay: Payer: Self-pay | Admitting: Pulmonary Disease

## 2013-04-01 ENCOUNTER — Telehealth: Payer: Self-pay | Admitting: *Deleted

## 2013-04-01 ENCOUNTER — Ambulatory Visit
Admission: RE | Admit: 2013-04-01 | Discharge: 2013-04-01 | Disposition: A | Discharge status: Home / Self Care | Payer: No Typology Code available for payment source | Source: Ambulatory Visit | Attending: Thoracic Surgery (Cardiothoracic Vascular Surgery) | Admitting: Thoracic Surgery (Cardiothoracic Vascular Surgery)

## 2013-04-01 DIAGNOSIS — R51 Headache: Secondary | ICD-10-CM

## 2013-04-01 MED ORDER — GADOBENATE DIMEGLUMINE 529 MG/ML IV SOLN
20.0000 mL | Freq: Once | INTRAVENOUS | Status: AC | PRN
  Administered 2013-04-01: 20 mL via INTRAVENOUS

## 2013-04-01 NOTE — Telephone Encounter (Signed)
Pt left VM states needs a PCP,  Asks if Dr. Gaylyn Rong is or could be his PCP?  If not can he recommend one?   Called pt back and asked that he return nurse's call again to discuss this.

## 2013-04-01 NOTE — Telephone Encounter (Signed)
Pt returned call regarding getting a PCP or Dr. Gaylyn Rong being his PCP.   Informed him Dr. Gaylyn Rong is not his PCP and he does need to have one.  Encouraged him to pick one and call for appt..  He sees Dr. Gaylyn Rong next week.  Asked him to let us know if he does not have any luck in getting appt w/ anyone at that time.  Pt states he was told he has diabetes and needs to see a doctor for this.  He also c/o tingling in his hands and feet he thinks might be from diabetes.  Encouraged pt to find PCP as soon as possible and let us know on his next visit. He verbalized understanding.

## 2013-04-04 ENCOUNTER — Other Ambulatory Visit: Payer: Self-pay | Admitting: Oncology

## 2013-04-04 ENCOUNTER — Telehealth: Payer: Self-pay | Admitting: *Deleted

## 2013-04-04 NOTE — Telephone Encounter (Signed)
Dr. Gaylyn Rong needs to r/s pt's appt from 4/23 for several weeks.  Per Dr. Gaylyn Rong, "Please call him and explain to him that I'll delay his RV. It takes time to put patients on the OR schedule for laryngoscopy to see if he has a primary hypopharynx mass."  Pt's appt w/ Dr. Pollyann Kennedy is on 4/24.  Left VM for pt on home and cell asking him to return nurse's call so I can explain reason for office visit being rescheduled.

## 2013-04-04 NOTE — Telephone Encounter (Signed)
Per 4/21 pof r/s 4/23 appt to 1st wk in May. lmonvm for pt on both home/cell re change w/new d/t.

## 2013-04-05 ENCOUNTER — Encounter: Payer: Self-pay | Admitting: *Deleted

## 2013-04-05 ENCOUNTER — Ambulatory Visit (INDEPENDENT_AMBULATORY_CARE_PROVIDER_SITE_OTHER): Payer: Self-pay | Admitting: Thoracic Surgery (Cardiothoracic Vascular Surgery)

## 2013-04-05 ENCOUNTER — Encounter: Payer: Self-pay | Admitting: Thoracic Surgery (Cardiothoracic Vascular Surgery)

## 2013-04-05 VITALS — BP 102/65 | HR 128 | Resp 20 | Ht 75.0 in | Wt 209.0 lb

## 2013-04-05 DIAGNOSIS — R222 Localized swelling, mass and lump, trunk: Secondary | ICD-10-CM

## 2013-04-05 DIAGNOSIS — Z9889 Other specified postprocedural states: Secondary | ICD-10-CM

## 2013-04-05 DIAGNOSIS — J9859 Other diseases of mediastinum, not elsewhere classified: Secondary | ICD-10-CM

## 2013-04-05 DIAGNOSIS — R918 Other nonspecific abnormal finding of lung field: Secondary | ICD-10-CM

## 2013-04-05 NOTE — Progress Notes (Signed)
Patient ID: Rodney Powers, male   DOB: August 30, 1956, 57 y.o.   MRN: [id number]  Rodney Powers returns today for her second followup appointment. He has a large left hilar mass that is invading the mediastinum. It was unclear with this was a mediastinal mass as a thymic cancer invading the lung or a lung cancer of thymus. We did a Chamberlain procedure 2 weeks ago hand and his postop followup visit last week we only knew that it was a poorly differentiated carcinoma. He returns today to further discuss the pathology.  BP 102/65  Pulse 128  Resp 20  Ht 6\' 3"  (1.905 m)  Wt 209 lb (94.802 kg)  BMI 26.12 kg/m2  SpO67 37% 57 year old male in no acute distress Incision clean dry and intact  Final pathologic report shows the lesion is poorly differentiated and consistent with a squamous cell carcinoma. This would be consistent with a T4 lung cancer. He is not a candidate for surgical resection will need to be treated with chemotherapy and radiation.  He will be seen in the MTOC this Thursday at 1:00 to meet with oncology and radiation oncology

## 2013-04-05 NOTE — Progress Notes (Signed)
Family called wanting resource information on lung cancer.  I stated they could come to the cancer center or wait till Thursday's mtoc and I will bring information.  They would like to wait till thursday

## 2013-04-06 ENCOUNTER — Ambulatory Visit: Payer: Self-pay | Admitting: Oncology

## 2013-04-07 ENCOUNTER — Ambulatory Visit
Admission: RE | Admit: 2013-04-07 | Discharge: 2013-04-07 | Disposition: A | Discharge status: Home / Self Care | Payer: MEDICAID | Source: Ambulatory Visit | Attending: Radiation Oncology | Admitting: Radiation Oncology

## 2013-04-07 ENCOUNTER — Ambulatory Visit: Payer: Self-pay | Admitting: Internal Medicine

## 2013-04-07 ENCOUNTER — Encounter: Payer: Self-pay | Admitting: Radiation Oncology

## 2013-04-07 ENCOUNTER — Encounter: Payer: Self-pay | Admitting: Internal Medicine

## 2013-04-07 ENCOUNTER — Encounter: Payer: Self-pay | Admitting: *Deleted

## 2013-04-07 DIAGNOSIS — C3490 Malignant neoplasm of unspecified part of unspecified bronchus or lung: Secondary | ICD-10-CM

## 2013-04-07 HISTORY — DX: Malignant neoplasm of unspecified part of unspecified bronchus or lung: C34.90

## 2013-04-07 NOTE — Progress Notes (Signed)
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 male who is presented earlier this year with some swallowing difficulties. He did undergo a chest x-ray,  CT scan which revealed a AP window mass.  A PET scan was performed which revealed a large prevascular mass measuring 5 x 6.2 x 3.9 cm. In addition there was hypermetabolic left hilar lymphadenopathy and a hypermetabolic AP window lymph node. Within the neck the patient was noted to have a metabolic activity within the right side of the hypopharynx. The patient did undergo a chamberlain procedure with pathology revealing poorly differentiated malignancy most consistent with squamous cell carcinoma. The patient was not felt to be an operative candidate patient is now seen out of the courtesy of Dr. Dorris Fetch as part of the multidisciplinary thoracic oncology clinic.   PREVIOUS RADIATION THERAPY: No  PAST MEDICAL HISTORY:  has a past medical history of Back pain, chronic; Diabetes mellitus, type II; Mediastinal mass; Anemia; Numbness and tingling in hands; Numbness and tingling of foot; Urinary retention; COPD (chronic obstructive pulmonary disease); and Lung cancer (04/07/2013).    PAST SURGICAL HISTORY: Past Surgical History  Procedure Laterality Date  . Video bronchoscopy with endobronchial ultrasound Bilateral 03/10/2013    Procedure: VIDEO BRONCHOSCOPY WITH ENDOBRONCHIAL ULTRASOUND;  Surgeon: Oretha Milch, MD;  Location: [facility] OR;  Service: Pulmonary;  Laterality: Bilateral;  . Mediastinotomy chamberlain mcneil Left 03/24/2013   Procedure: MEDIASTINOTOMY CHAMBERLAIN MCNEIL;  Surgeon: Loreli Slot, MD;  Location: [facility] OR;  Service: Thoracic;  Laterality: Left;    FAMILY HISTORY: family history includes Aneurysm in his mother and sister and Heart attack in his father.  SOCIAL HISTORY:  reports that he has been smoking Cigarettes.  He started smoking about 41 years ago. He has a 60 pack-year smoking history. He does not have any smokeless tobacco history on file. He reports that he drinks about 3.6 ounces of alcohol per week. He reports that he does not use illicit drugs.  ALLERGIES: Review of patient's allergies indicates no known allergies.  MEDICATIONS:  Current Outpatient Prescriptions  Medication Sig Dispense Refill  . glimepiride (AMARYL) 1 MG tablet Take 1 tablet (1 mg total) by mouth daily before breakfast.  30 tablet  3  . HYDROcodone-acetaminophen (NORCO) 5-325 MG per tablet Take 1 tablet by mouth every 6 (six) hours as needed for pain.  90 tablet  0  . Multiple Vitamin (MULTIVITAMIN WITH MINERALS) TABS Take 1 tablet by mouth daily.  30 tablet  3  . tamsulosin (FLOMAX) 0.4 MG CAPS Take 1 capsule (0.4 mg total) by mouth daily after supper.  30 capsule  3   No current facility-administered medications for this encounter.    REVIEW OF SYSTEMS:  A 15 point review of systems is documented in the electronic medical record. This was obtained by the nursing staff. However, I reviewed this with the patient to discuss relevant findings and make appropriate changes.  He does have some mild shortness of breath. He is having less problems with swallowing. He denies any new bony pain headaches dizziness or blurred vision. He denies any hemoptysis.  PHYSICAL EXAM:  height is 6\' 3"  (1.905 m) and weight is 198 lb (89.812 kg). His temperature is 98 F (36.7 C). His blood pressure is 111/71 and his pulse is 110. His respiration is 19 and oxygen saturation is 96%.   BP 111/71  Pulse 110  Temp(Src) 98 F (36.7 C)   Resp 19  Ht 6\' 3"  (1.905 m)  Wt 198 lb (89.812 kg)  BMI 24.75 kg/m2  SpO2 96%  General Appearance:    Alert, cooperative, no distress, appears stated age  Head:    Normocephalic, without obvious abnormality, atraumatic  Eyes:    PERRL, conjunctiva/corneas clear, EOM's intact,         Nose:   Nares normal, septum midline, mucosa normal, no drainage    or sinus tenderness  Throat:   Lips, mucosa, and tongue normal; poor dentition  Neck:   Supple, symmetrical, trachea midline, no adenopathy;       thyroid:  No enlargement/tenderness/nodules;     Back:     Symmetric, no curvature, ROM normal, no CVA tenderness  Lungs:     Clear to auscultation bilaterally, respirations unlabored  Chest wall:    No tenderness or deformity, well healing scar in the left chest from chamberlain procedure  Heart:   Regular rythym and rate  Abdomen:     Soft, non-tender, bowel sounds active all four quadrants,    no masses, no organomegaly        Extremities:   Extremities normal, atraumatic, no cyanosis or edema  Pulses:   2+ and symmetric all extremities  Skin:   Skin color, texture, turgor normal, no rashes or lesions  Lymph nodes:   Cervical, supraclavicular, and axillary nodes normal  Neurologic:    Normal strength,     throughout    LABORATORY DATA:  Lab Results  Component Value Date   WBC 11.9* 03/25/2013   HGB 10.2* 03/25/2013   HCT 30.9* 03/25/2013   MCV 73.0* 03/25/2013   PLT 278 03/25/2013   Lab Results  Component Value Date   NA 132* 03/25/2013   K 3.9 03/25/2013   CL 100 03/25/2013   CO2 26 03/25/2013   Lab Results  Component Value Date   ALT 70* 03/23/2013   AST 30 03/23/2013   ALKPHOS 103 03/23/2013   BILITOT 0.2* 03/23/2013     RADIOGRAPHY: Dg Chest 2 View  03/23/2013  *RADIOLOGY REPORT*  Clinical Data: Preop mediastinotomy  CHEST - 2 VIEW  Comparison: PET CT dated 03/15/2013.  Findings: Left suprahilar/mediastinal mass.  Lungs are clear. No pleural effusion or pneumothorax.  The heart is  normal in size.  Mild degenerative changes of the visualized thoracolumbar spine.  IMPRESSION: Left suprahilar/mediastinal mass.   Original Report Authenticated By: Charline Bills, M.D.    Mr Laqueta Jean Wo Contrast  04/01/2013  *RADIOLOGY REPORT*  Clinical Data: 57 year old male with recently diagnosed lung cancer.  Headache.  Staging.  MRI HEAD WITHOUT AND WITH CONTRAST  Technique:  Multiplanar, multiecho pulse sequences of the brain and surrounding structures were obtained according to standard protocol without and with intravenous contrast  Contrast: 20mL MULTIHANCE GADOBENATE DIMEGLUMINE 529 MG/ML IV SOLN  Comparison: Head CTs without contrast 10/13/2011 and earlier. Brain MRI without contrast 11/15/2006.  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  Small vertex sebaceous cyst or similar dermal lesion also is stable.  No abnormal enhancement identified.  No midline shift, mass effect, or evidence of mass lesion.  Stable cerebral volume. No  restricted diffusion to suggest acute infarction.  No ventriculomegaly. No acute intracranial hemorrhage identified.  Negative pituitary, cervicomedullary junction and visualized cervical spinal cord.  The Major intracranial vascular flow voids are preserved.  Occasional small foci of white matter T2 and FLAIR hyperintensity, to a degree which is generally considered within normal limits in this age group. Gray-white matter signal elsewhere within normal limits.  Visualized orbit soft tissues are within normal limits.  Grossly normal visualized internal auditory structures. Visualized paranasal sinuses and mastoids are clear.  Bone marrow signal is mildly heterogeneous at the skull base and in the visible cervical spine.  No destructive osseous lesion identified.  IMPRESSION: 1. No acute or metastatic intracranial abnormality identified. 2.  Mildly heterogeneous bone marrow signal, favor benign or treatment related at this point.  Attention directed on follow-up.   Original Report Authenticated By: Erskine Speed, M.D.    Nm Pet Image Initial (pi) Skull Base To Thigh  03/15/2013  *RADIOLOGY REPORT*  Clinical Data: Initial treatment strategy for lung mass.  NUCLEAR MEDICINE PET SKULL BASE TO THIGH  Fasting Blood Glucose:  94  Technique:  18 mCi F-18 FDG was injected intravenously. CT data was obtained and used for attenuation correction and anatomic localization only.  (This was not acquired as a diagnostic CT examination.) Additional exam technical data entered on technologist worksheet.  Comparison:  10/13/2011  Findings:  Neck: Within the right side of the hypopharynx there is abnormal soft tissue thickening with associated increased FDG uptake.  This measures 1.6 x 2.1 cm and has an SUV max equal to 9.6, image 33. No hypermetabolic lymph nodes identified within the neck.  Chest:  Large prevascular mass is identified extending into the left upper lobe.  This measures 5 x 6.2 by 3.9 cm.  The SUV max associated this mass is equal to 27.  Hypermetabolic left hilar lymph node has an SUV max equal to 6.8, image 92.  There is an AP window lymph node which is hypermetabolic.  This measures 1.3 cm and has an SUV max equal to 6.30, image 87.  There is no pleural effusion identified.  Advanced changes of centrilobular and paraseptal emphysema identified. Pulmonary nodule within the posterior left upper lobe measures 7 mm.  The SUV max associated this nodule is equal to 2.5.  Abdomen/Pelvis:  Within the inferior right hepatic lobe there is a 1.8 cm hypodense structure, image 143.  There is no abnormal FDG uptake associated with this structure.  The pancreas and the spleen both appear normal.  The adrenal glands are unremarkable. The prostate gland is enlarged and there is diffuse bladder wall thickening suggesting chronic bladder outlet obstruction.  There is a focal area of increased uptake within the central aspect of the posterior prostate gland.   This is nonspecific with an SUV max equal to 6.5.  Skeleton:  No focal hypermetabolic activity to suggest skeletal metastasis.  IMPRESSION: 1.  There is a large hypermetabolic mass centered over the prevascular lymph node region and extending into the left upper lobe. This is concerning for bronchogenic carcinoma. 2.  Hypermetabolic left hilar and AP window lymph nodes which are worrisome for metastatic adenopathy. 3.  Left upper lobe pulmonary nodule exhibits malignant range FDG uptake within SUV max equal to 2.5.  4. Within the right side of the hypopharynx there is a soft tissue attenuating lesion which exhibits intense FDG uptake.  Cannot rule out primary head neck carcinoma.  Correlation with direct visualization is advised.  Additionally,  contrast-enhanced CT of the neck may be helpful for further characterization.   Original Report Authenticated By: Signa Kell, M.D.    Dg Chest Port 1 View  03/25/2013  *RADIOLOGY REPORT*  Clinical Data: Chest tube removal.  Tachycardia and tachypnea with decreased O2 sats.  PORTABLE CHEST - 1 VIEW  Comparison: 03/25/2013.  Findings: Trachea is midline.  Heart size stable.  AP window mass is again noted.  Left chest tube has been removed.  Suspect tiny left apical pneumothorax.  Tiny left pleural effusion.  Improving bibasilar aeration.  IMPRESSION:  1.  Suspect tiny left apical pneumothorax after left chest tube removal. 2.  AP window mass. 3.  Improving bibasilar aeration without complete resolution of air space disease. 4.  Tiny left pleural effusion.   Original Report Authenticated By: Leanna Battles, M.D.    Dg Chest Port 1 View  03/25/2013  *RADIOLOGY REPORT*  Clinical Data: Chest tube evaluation.  PORTABLE CHEST - 1 VIEW  Comparison: 03/24/2013 and CT chest 03/06/2013.  Findings: Trachea is midline.  Heart size is accentuated by AP semi upright technique and low lung volumes.  AP window mass is again noted.  Left chest tube is in place.  Slight lucency at the  apex of the left hemithorax without definite pleural edge.  Mild diffuse bilateral air space disease.  Small left pleural effusion.  IMPRESSION:  1.  AP window mass. 2.  Suspect small left apical pneumothorax with left chest tube in place. 3.  Mild diffuse bilateral air space disease, possibly due to edema. 4.  Small left pleural effusion.   Original Report Authenticated By: Leanna Battles, M.D.    Dg Chest Portable 1 View  03/24/2013  *RADIOLOGY REPORT*  Clinical Data: Mediastinal mass and postop.  PORTABLE CHEST - 1 VIEW  Comparison: 03/23/2013  Findings: Left chest tube tip at the apex.  There may be a trace amount of left apical air but no significant pneumothorax.  Diffuse interstitial densities throughout the left hemithorax probably related to volume loss and mild edema.  There are prominent interstitial densities in the right lower lung.  Stable densities along the left side of mediastinum.  IMPRESSION: Postoperative changes with a left chest tube.  Question a tiny left apical pneumothorax.  Prominent interstitial densities are suggestive for atelectasis and/or mild edema.   Original Report Authenticated By: Richarda Overlie, M.D.       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  The patient would be a good candidate for a definitive course of radiation along with radiosensitizing chemotherapy.   PLAN: Simulation and planning April 11, 2013.  I anticipate ~ 7 weeks of radiation therapy as part of his overall management.      ------------------------------------------------  -----------------------------------  Billie Lade, PhD, MD

## 2013-04-07 NOTE — Progress Notes (Signed)
Error encounter. 

## 2013-04-07 NOTE — Progress Notes (Signed)
   Thoracic Treatment Summary Name:Amara R Mithcell Date: 04/07/13 DOB:03/19/1956 Your Medical Team Medical Oncologist:Dr. Arbutus Ped Radiation Oncologist:Dr. Roselind Messier Pulmonologist: Surgeon: Type and Stage of Lung Cancer Non-Small Cell Carcinoma:  Squamous Cell Clinical Stage:  III Pathological Stage:  Clinical stage is based on radiology exams.  Pathological stage will be determined after surgery.  Staging is based on the size of the tumor, involvement of lymph nodes or not, and whether or not the cancer center has spread. Recommendations Recommendations: Concurrent chemoradiation therapy.   These recommendations are based on information available as of today's consult.  This is subject to change depending further testing or exams. Next Steps Next Step: 1. Simulation 04/11/13 at 8:30 for Radiation Therapy 2. Cancer Center will call with appointments for chemo education class and chemotherapy  Barriers to Care What do you perceive as a potential barrier that may prevent you from receiving your treatment plan? Pt perceives barrier to care not having PCP.  Dr. Arbutus Ped aware.  Pt instructed to call [facility].  Information given and explained about lung cancer and resources at the cancer center.   Questions Willette Pa, RN BSN Thoracic Oncology Nurse Navigator at [phone]  Annabelle Harman is a nurse navigator that is available to assist you through your cancer journey.  She can answer your questions and/or provide resources regarding your treatment plan, emotional support, or financial concerns.

## 2013-04-10 ENCOUNTER — Other Ambulatory Visit: Payer: Self-pay | Admitting: Internal Medicine

## 2013-04-11 ENCOUNTER — Ambulatory Visit
Admission: RE | Admit: 2013-04-11 | Discharge: 2013-04-11 | Disposition: A | Discharge status: Home / Self Care | Payer: Medicaid Other | Source: Ambulatory Visit | Attending: Radiation Oncology | Admitting: Radiation Oncology

## 2013-04-11 DIAGNOSIS — C3490 Malignant neoplasm of unspecified part of unspecified bronchus or lung: Secondary | ICD-10-CM | POA: Insufficient documentation

## 2013-04-11 DIAGNOSIS — L988 Other specified disorders of the skin and subcutaneous tissue: Secondary | ICD-10-CM | POA: Insufficient documentation

## 2013-04-11 DIAGNOSIS — Z79899 Other long term (current) drug therapy: Secondary | ICD-10-CM | POA: Insufficient documentation

## 2013-04-11 DIAGNOSIS — R5383 Other fatigue: Secondary | ICD-10-CM | POA: Insufficient documentation

## 2013-04-11 DIAGNOSIS — Z510 Encounter for antineoplastic radiation therapy: Secondary | ICD-10-CM | POA: Insufficient documentation

## 2013-04-11 DIAGNOSIS — R5381 Other malaise: Secondary | ICD-10-CM | POA: Insufficient documentation

## 2013-04-11 DIAGNOSIS — Y842 Radiological procedure and radiotherapy as the cause of abnormal reaction of the patient, or of later complication, without mention of misadventure at the time of the procedure: Secondary | ICD-10-CM | POA: Insufficient documentation

## 2013-04-11 NOTE — Progress Notes (Signed)
Pt denies pain this morning but does take Hydrocodone occasionally for upper left chest pain, w/good relief. He reports unintentional wgt loss past few weeks, denies cough, does report SOB w/minimal activity. He uses O2 @ 2L/min prn at home. Pt states he "was healthy until March 16th". Pt was admitted for being "unable to keep food down". Pt has soft raised area on right temple. He states "it popped up 30 years ago". He states it is not painful , has been seen by drs for this, it has never been treated in any way. CT sim notified pt is ready for sim.

## 2013-04-12 NOTE — Progress Notes (Signed)
  Radiation Oncology         [phone] ________________________________  Name: Rodney Powers MRN: [id number]  Date: 04/11/2013  DOB: 03-05-1956  SIMULATION AND TREATMENT PLANNING NOTE  DIAGNOSIS:  Stage III-B non-small cell lung cancer  NARRATIVE:  The patient was brought to the CT Simulation planning suite.  Identity was confirmed.  All relevant records and images related to the planned course of therapy were reviewed.  The patient freely provided informed written consent to proceed with treatment after reviewing the details related to the planned course of therapy. The consent form was witnessed and verified by the simulation staff.  Then, the patient was set-up in a stable reproducible  supine position for radiation therapy.  CT images were obtained.  Surface markings were placed.  The CT images were loaded into the planning software.  Then the target and avoidance structures were contoured.  Treatment planning then occurred.  The radiation prescription was entered and confirmed.  Then, I designed and supervised the construction of a total of 0 medically necessary complex treatment devices.  I have requested : 3D Simulation  I have requested a DVH of the following structures: GTV, PTV, Heart, esophagus, lungs.  I have ordered:dose calc.  PLAN:  The patient will receive 63 Gy in 35 fractions.  ________________________________   Special treatment procedure note  The patient will be receiving radiosensitizing chemotherapy throughout his course of treatment. Given the increased potential for toxicities as well as the necessity for close monitoring of the patient and bloodwork, this constitutes a special treatment procedure. -----------------------------------  Billie Lade, PhD, MD

## 2013-04-13 ENCOUNTER — Encounter: Payer: Self-pay | Admitting: Oncology

## 2013-04-13 ENCOUNTER — Other Ambulatory Visit: Payer: Self-pay

## 2013-04-13 ENCOUNTER — Telehealth: Payer: Self-pay | Admitting: Oncology

## 2013-04-13 NOTE — Progress Notes (Signed)
Called DSS to check the patient's status for medicaid. Per Ms. Laurence Compton, there is no one by patient in the system. He needs to apply.

## 2013-04-13 NOTE — Progress Notes (Signed)
I called the patient to advise what DSS said. He said he applied late march. I gave him the ph to call and confirm and get a case worker name if possible.

## 2013-04-13 NOTE — Telephone Encounter (Signed)
lmonvm at both home/cell phones confirming appt for 5/1 @ 2pm. Pt asked to call to confirm appt and to get new schedule 5/1.

## 2013-04-14 ENCOUNTER — Ambulatory Visit (HOSPITAL_BASED_OUTPATIENT_CLINIC_OR_DEPARTMENT_OTHER): Payer: Medicaid Other | Admitting: Oncology

## 2013-04-14 ENCOUNTER — Telehealth: Payer: Self-pay | Admitting: Oncology

## 2013-04-14 DIAGNOSIS — C3490 Malignant neoplasm of unspecified part of unspecified bronchus or lung: Secondary | ICD-10-CM

## 2013-04-14 NOTE — Patient Instructions (Addendum)
1.  Diagnosis:  Squamous cell carcinoma of the lung.  Need to rule out cancer from head and neck as well. 2.  Work up:  Need to see Dr. Pollyann Kennedy from Ear/Nose/Throat within the next few weeks. 3.  Treatment for lung cancer:  Concurrent chemoradiation.  *  Chemo:  Carboplatin/Taxol once a week for the duration of radiation for about 5-6 weeks.  *  Side effects of combination regimen of Carboplatin andTaxol include but are not limited to mouth sore, low blood count, infection, infusion reaction, numbness and tingling, muscle pain, bone pain, lung inflammation. 4.  To assess response:  Repeat CT scan after finish chemoradiation.

## 2013-04-14 NOTE — Progress Notes (Signed)
[facility]  Telephone:([phone]) [phone] Fax:(336) [phone]   OFFICE PROGRESS NOTE   Cc:  Rodney Powers, Provider, MD  DIAGNOSIS:  Stage III nonsmall cell lung cancer the left lung; squamous cell carcinoma.   PAST THERAPY:  Biopsy only.   CURRENT THERAPY:  Due to start concurrent chemoradiation soon.   INTERVAL HISTORY: Rodney Powers 57 y.o. male returns for regular follow up.  He reported that he still has intermittent nausea/vomiting, diarrhea.  He denied chest pain, SOB, abd pain, bleeding symptoms.  He has mild fatigue; however, he is still independent of all activities of daily living.  The rest of the 14-point review of system was negative. He reports that he only smokes about 3 cigarettes a daily.  He plans to quit completely by next week cold [place] without assistance.   Past Medical History  Diagnosis Date  . Back pain, chronic   . Diabetes mellitus, type II   . Mediastinal mass   . Anemia   . Numbness and tingling in hands   . Numbness and tingling of foot     bilateral  . Urinary retention   . COPD (chronic obstructive pulmonary disease)   . Lung cancer 04/07/2013    Past Surgical History  Procedure Laterality Date  . Video bronchoscopy with endobronchial ultrasound Bilateral 03/10/2013    Procedure: VIDEO BRONCHOSCOPY WITH ENDOBRONCHIAL ULTRASOUND;  Surgeon: Oretha Milch, MD;  Location: [facility] OR;  Service: Pulmonary;  Laterality: Bilateral;  . Mediastinotomy chamberlain mcneil Left 03/24/2013    Procedure: MEDIASTINOTOMY CHAMBERLAIN MCNEIL;  Surgeon: Loreli Slot, MD;  Location: [facility] OR;  Service: Thoracic;  Laterality: Left;  . Excisional hemorrhoidectomy      > 30 yrs ago    Current Outpatient Prescriptions  Medication Sig Dispense Refill  . glimepiride (AMARYL) 1 MG tablet Take 1 tablet (1 mg total) by mouth daily before breakfast.  30 tablet  3  . HYDROcodone-acetaminophen (NORCO) 5-325 MG per tablet Take 1 tablet by mouth every 6 (six)  hours as needed for pain.  90 tablet  0  . Multiple Vitamin (MULTIVITAMIN WITH MINERALS) TABS Take 1 tablet by mouth daily.  30 tablet  3  . tamsulosin (FLOMAX) 0.4 MG CAPS Take 1 capsule (0.4 mg total) by mouth daily after supper.  30 capsule  3   No current facility-administered medications for this visit.    ALLERGIES:  has No Known Allergies.  REVIEW OF SYSTEMS:  The rest of the 14-point review of system was negative.   Filed Vitals:   04/14/13 1402  BP: 144/77  Pulse: 105  Temp: 97.2 F (36.2 C)  Resp: 18   Wt Readings from Last 3 Encounters:  04/14/13 200 lb 6.4 oz (90.901 kg)  04/11/13 201 lb 11.2 oz (91.491 kg)  04/07/13 198 lb (89.812 kg)   ECOG Performance status: ECOG 1.   PHYSICAL EXAMINATION:  General: well-nourished man, in no acute distress. Eyes: no scleral icterus. ENT: There were no oropharyngeal lesions. Neck was without thyromegaly. Lymphatics: Negative cervical, supraclavicular or axillary adenopathy. Respiratory: lungs were clear bilaterally without wheezing or crackles. Cardiovascular: Regular rate and rhythm, S1/S2, without murmur, rub or gallop. There was no pedal edema. GI: abdomen was soft, flat, nontender, nondistended, without organomegaly. Muscoloskeletal: no spinal tenderness of palpation of vertebral spine. Skin exam was without echymosis, petichae. Neuro exam was nonfocal. Patient was able to get on and off exam table without assistance. Gait was normal. Patient was alerted and oriented. Attention was  good. Language was appropriate. Mood was normal without depression. Speech was not pressured. Thought content was not tangential.       LABORATORY/RADIOLOGY DATA:  Lab Results  Component Value Date   WBC 11.9* 03/25/2013   HGB 10.2* 03/25/2013   HCT 30.9* 03/25/2013   PLT 278 03/25/2013   GLUCOSE 154* 03/25/2013   ALKPHOS 103 03/23/2013   ALT 70* 03/23/2013   AST 30 03/23/2013   NA 132* 03/25/2013   K 3.9 03/25/2013   CL 100 03/25/2013   CREATININE 0.96  03/25/2013   BUN 11 03/25/2013   CO2 26 03/25/2013   INR 1.01 03/23/2013   HGBA1C 7.6* 03/06/2013     ASSESSMENT AND PLAN:   1.  Diagnosis:  Squamous cell carcinoma of the lung.  Need to rule out cancer from head and neck as well. 2.  Work up:  Need to see Dr. Pollyann Kennedy from Ear/Nose/Throat within the next few weeks. 3.  Treatment for lung cancer:  Concurrent chemoradiation.  *  Chemo:  Carboplatin/Taxol once a week for the duration of radiation for about 5-6 weeks.  *  Side effects of combination regimen of Carboplatin andTaxol include but are not limited to mouth sore, low blood count, infection, infusion reaction, numbness and tingling, muscle pain, bone pain, lung inflammation. 4.  To assess response:  Repeat CT scan after finish chemoradiation.   Rodney Powers and his wife expressed informed understanding, asked appropriate questions, and agreed to start chemo next week.   5.  Follow up: Once a week while on chemo.   The length of time of the face-to-face encounter was 25 minutes. More than 50% of time was spent counseling and coordination of care.

## 2013-04-15 ENCOUNTER — Encounter: Payer: Self-pay | Admitting: Oncology

## 2013-04-18 ENCOUNTER — Other Ambulatory Visit: Payer: Self-pay | Admitting: Oncology

## 2013-04-18 ENCOUNTER — Ambulatory Visit (HOSPITAL_BASED_OUTPATIENT_CLINIC_OR_DEPARTMENT_OTHER): Payer: Medicaid Other

## 2013-04-18 ENCOUNTER — Encounter: Payer: Self-pay | Admitting: Oncology

## 2013-04-18 ENCOUNTER — Ambulatory Visit (HOSPITAL_BASED_OUTPATIENT_CLINIC_OR_DEPARTMENT_OTHER): Payer: Medicaid Other | Admitting: Lab

## 2013-04-18 ENCOUNTER — Ambulatory Visit
Admission: RE | Admit: 2013-04-18 | Discharge: 2013-04-18 | Disposition: A | Discharge status: Home / Self Care | Payer: Medicaid Other | Source: Ambulatory Visit | Attending: Radiation Oncology | Admitting: Radiation Oncology

## 2013-04-18 ENCOUNTER — Other Ambulatory Visit: Payer: Self-pay

## 2013-04-18 DIAGNOSIS — Z5111 Encounter for antineoplastic chemotherapy: Secondary | ICD-10-CM

## 2013-04-18 DIAGNOSIS — C3410 Malignant neoplasm of upper lobe, unspecified bronchus or lung: Secondary | ICD-10-CM

## 2013-04-18 LAB — BASIC METABOLIC PANEL (CC13)
BUN: 8.1 mg/dL (ref 7.0–26.0)
CO2: 24 mEq/L (ref 22–29)
Calcium: 9.3 mg/dL (ref 8.4–10.4)
Chloride: 98 mEq/L (ref 98–107)
Creatinine: 0.9 mg/dL (ref 0.7–1.3)
Glucose: 108 mg/dl — ABNORMAL HIGH (ref 70–99)

## 2013-04-18 LAB — CBC WITH DIFFERENTIAL/PLATELET
BASO%: 0.7 % (ref 0.0–2.0)
EOS%: 1.3 % (ref 0.0–7.0)
HCT: 29.3 % — ABNORMAL LOW (ref 38.4–49.9)
LYMPH%: 13.5 % — ABNORMAL LOW (ref 14.0–49.0)
MCH: 22.4 pg — ABNORMAL LOW (ref 27.2–33.4)
MCHC: 31.2 g/dL — ABNORMAL LOW (ref 32.0–36.0)
NEUT%: 73.6 % (ref 39.0–75.0)
lymph#: 1.2 10*3/uL (ref 0.9–3.3)

## 2013-04-18 MED ORDER — SODIUM CHLORIDE 0.9 % IV SOLN
290.0000 mg | Freq: Once | INTRAVENOUS | Status: AC
  Administered 2013-04-18: 290 mg via INTRAVENOUS
  Filled 2013-04-18: qty 29

## 2013-04-18 MED ORDER — ONDANSETRON HCL 8 MG PO TABS: 8.0000 mg | ORAL_TABLET | Freq: Two times a day (BID) | ORAL | Status: DC | PRN

## 2013-04-18 MED ORDER — DEXAMETHASONE 4 MG PO TABS: 4.0000 mg | ORAL_TABLET | Freq: Two times a day (BID) | ORAL | Status: DC

## 2013-04-18 MED ORDER — FAMOTIDINE IN NACL 20-0.9 MG/50ML-% IV SOLN
20.0000 mg | Freq: Once | INTRAVENOUS | Status: AC
  Administered 2013-04-18: 20 mg via INTRAVENOUS

## 2013-04-18 MED ORDER — PROCHLORPERAZINE MALEATE 10 MG PO TABS: 10.0000 mg | ORAL_TABLET | Freq: Four times a day (QID) | ORAL | Status: DC | PRN

## 2013-04-18 MED ORDER — DIPHENHYDRAMINE HCL 50 MG/ML IJ SOLN
50.0000 mg | Freq: Once | INTRAMUSCULAR | Status: AC
  Administered 2013-04-18: 50 mg via INTRAVENOUS

## 2013-04-18 MED ORDER — LORAZEPAM 0.5 MG PO TABS: 0.5000 mg | ORAL_TABLET | Freq: Four times a day (QID) | ORAL | Status: DC | PRN

## 2013-04-18 MED ORDER — ONDANSETRON 16 MG/50ML IVPB (CHCC)
16.0000 mg | Freq: Once | INTRAVENOUS | Status: AC
  Administered 2013-04-18: 16 mg via INTRAVENOUS

## 2013-04-18 MED ORDER — SODIUM CHLORIDE 0.9 % IV SOLN
45.0000 mg/m2 | Freq: Once | INTRAVENOUS | Status: AC
  Administered 2013-04-18: 102 mg via INTRAVENOUS
  Filled 2013-04-18: qty 17

## 2013-04-18 MED ORDER — SODIUM CHLORIDE 0.9 % IV SOLN
Freq: Once | INTRAVENOUS | Status: AC
  Administered 2013-04-18: 12:00:00 via INTRAVENOUS

## 2013-04-18 MED ORDER — PROCHLORPERAZINE 25 MG RE SUPP: 25.0000 mg | Freq: Two times a day (BID) | RECTAL | Status: DC | PRN

## 2013-04-18 MED ORDER — DEXAMETHASONE SODIUM PHOSPHATE 20 MG/5ML IJ SOLN
20.0000 mg | Freq: Once | INTRAMUSCULAR | Status: AC
  Administered 2013-04-18: 20 mg via INTRAVENOUS

## 2013-04-18 NOTE — Progress Notes (Signed)
Called in Zofran, Compazine, Dexamethasone and Ativan Rxs called in to [facility].  Patient aware and will pick up tomorrow (04/19/13)

## 2013-04-18 NOTE — Progress Notes (Signed)
Wife came in and I gave her Lauren's card. She is wondering if there are any other resources for help. He has discount with his and the funds. I told her she has to use [facility] pharmacy only and our doctors. She did say Medicaid is still pending and had spoke with DSS on Friday about his application.

## 2013-04-18 NOTE — Progress Notes (Signed)
  Radiation Oncology         [phone] ________________________________  Name: Rodney Powers MRN: [id number]  Date: 04/18/2013  DOB: 02-12-1956  Simulation Verification Note  Status: outpatient  NARRATIVE: The patient was brought to the treatment unit and placed in the planned treatment position. The clinical setup was verified. Then port films were obtained and uploaded to the radiation oncology medical record software.  The treatment beams were carefully compared against the planned radiation fields. The position location and shape of the radiation fields was reviewed. They targeted volume of tissue appears to be appropriately covered by the radiation beams. Organs at risk appear to be excluded as planned.  Based on my personal review, I approved the simulation verification. The patient's treatment will proceed as planned.  -----------------------------------  Billie Lade, PhD, MD

## 2013-04-18 NOTE — Patient Instructions (Addendum)
[facility] Discharge Instructions for Patients Receiving Chemotherapy  Today you received the following chemotherapy agents:  Taxol, carboplatin  To help prevent nausea and vomiting after your treatment, we encourage you to take your nausea medication.  Take it as often as prescribed.     If you develop nausea and vomiting that is not controlled by your nausea medication, call the clinic. If it is after clinic hours your family physician or the after hours number for the clinic or go to the Emergency Department.   BELOW ARE SYMPTOMS THAT SHOULD BE REPORTED IMMEDIATELY:  *FEVER GREATER THAN 100.5 F  *CHILLS WITH OR WITHOUT FEVER  NAUSEA AND VOMITING THAT IS NOT CONTROLLED WITH YOUR NAUSEA MEDICATION  *UNUSUAL SHORTNESS OF BREATH  *UNUSUAL BRUISING OR BLEEDING  TENDERNESS IN MOUTH AND THROAT WITH OR WITHOUT PRESENCE OF ULCERS  *URINARY PROBLEMS  *BOWEL PROBLEMS  UNUSUAL RASH Items with * indicate a potential emergency and should be followed up as soon as possible.  One of the nurses will contact you 24 hours after your treatment. Please let the nurse know about any problems that you may have experienced. Feel free to call the clinic you have any questions or concerns. The clinic phone number is [phone].   I have been informed and understand all the instructions given to me. I know to contact the clinic, my physician, or go to the Emergency Department if any problems should occur. I do not have any questions at this time, but understand that I may call the clinic during office hours   should I have any questions or need assistance in obtaining follow up care.    __________________________________________  _____________  __________ Signature of Patient or Authorized Representative            Date                   Time    __________________________________________ Nurse's Signature    Paclitaxel injection What is this medicine? PACLITAXEL (PAK li  TAX el) is a chemotherapy drug. It targets fast dividing cells, like cancer cells, and causes these cells to die. This medicine is used to treat ovarian cancer, breast cancer, and other cancers. This medicine may be used for other purposes; ask your health care provider or pharmacist if you have questions. What should I tell my health care provider before I take this medicine? They need to know if you have any of these conditions: -blood disorders -irregular heartbeat -infection (especially a virus infection such as chickenpox, cold sores, or herpes) -liver disease -previous or ongoing radiation therapy -an unusual or allergic reaction to paclitaxel, alcohol, polyoxyethylated castor oil, other chemotherapy agents, other medicines, foods, dyes, or preservatives -pregnant or trying to get pregnant -breast-feeding How should I use this medicine? This drug is given as an infusion into a vein. It is administered in a hospital or clinic by a specially trained health care professional. Talk to your pediatrician regarding the use of this medicine in children. Special care may be needed. Overdosage: If you think you have taken too much of this medicine contact a poison control center or emergency room at once. NOTE: This medicine is only for you. Do not share this medicine with others. What if I miss a dose? It is important not to miss your dose. Call your doctor or health care professional if you are unable to keep an appointment. What may interact with this medicine? Do not take this medicine with any  of the following medications: -disulfiram -metronidazole This medicine may also interact with the following medications: -cyclosporine -dexamethasone -diazepam -ketoconazole -medicines to increase blood counts like filgrastim, pegfilgrastim, sargramostim -other chemotherapy drugs like cisplatin, doxorubicin, epirubicin, etoposide, teniposide,  vincristine -quinidine -testosterone -vaccines -verapamil Talk to your doctor or health care professional before taking any of these medicines: -acetaminophen -aspirin -ibuprofen -ketoprofen -naproxen This list may not describe all possible interactions. Give your health care provider a list of all the medicines, herbs, non-prescription drugs, or dietary supplements you use. Also tell them if you smoke, drink alcohol, or use illegal drugs. Some items may interact with your medicine. What should I watch for while using this medicine? Your condition will be monitored carefully while you are receiving this medicine. You will need important blood work done while you are taking this medicine. This drug may make you feel generally unwell. This is not uncommon, as chemotherapy can affect healthy cells as well as cancer cells. Report any side effects. Continue your course of treatment even though you feel ill unless your doctor tells you to stop. In some cases, you may be given additional medicines to help with side effects. Follow all directions for their use. Call your doctor or health care professional for advice if you get a fever, chills or sore throat, or other symptoms of a cold or flu. Do not treat yourself. This drug decreases your body's ability to fight infections. Try to avoid being around people who are sick. This medicine may increase your risk to bruise or bleed. Call your doctor or health care professional if you notice any unusual bleeding. Be careful brushing and flossing your teeth or using a toothpick because you may get an infection or bleed more easily. If you have any dental work done, tell your dentist you are receiving this medicine. Avoid taking products that contain aspirin, acetaminophen, ibuprofen, naproxen, or ketoprofen unless instructed by your doctor. These medicines may hide a fever. Do not become pregnant while taking this medicine. Women should inform their doctor if  they wish to become pregnant or think they might be pregnant. There is a potential for serious side effects to an unborn child. Talk to your health care professional or pharmacist for more information. Do not breast-feed an infant while taking this medicine. Men are advised not to father a child while receiving this medicine. What side effects may I notice from receiving this medicine? Side effects that you should report to your doctor or health care professional as soon as possible: -allergic reactions like skin rash, itching or hives, swelling of the face, lips, or tongue -low blood counts - This drug may decrease the number of white blood cells, red blood cells and platelets. You may be at increased risk for infections and bleeding. -signs of infection - fever or chills, cough, sore throat, pain or difficulty passing urine -signs of decreased platelets or bleeding - bruising, pinpoint red spots on the skin, black, tarry stools, nosebleeds -signs of decreased red blood cells - unusually weak or tired, fainting spells, lightheadedness -breathing problems -chest pain -high or low blood pressure -mouth sores -nausea and vomiting -pain, swelling, redness or irritation at the injection site -pain, tingling, numbness in the hands or feet -slow or irregular heartbeat -swelling of the ankle, feet, hands Side effects that usually do not require medical attention (report to your doctor or health care professional if they continue or are bothersome): -bone pain -complete hair loss including hair on your head, underarms, pubic hair,  eyebrows, and eyelashes -changes in the color of fingernails -diarrhea -loosening of the fingernails -loss of appetite -muscle or joint pain -red flush to skin -sweating This list may not describe all possible side effects. Call your doctor for medical advice about side effects. You may report side effects to FDA at 1-800-FDA-1088. Where should I keep my  medicine? This drug is given in a hospital or clinic and will not be stored at home. NOTE: This sheet is a summary. It may not cover all possible information. If you have questions about this medicine, talk to your doctor, pharmacist, or health care provider.  2012, Elsevier/Gold Standard. (11/13/2008 11:54:26 AM)  Carboplatin injection What is this medicine? CARBOPLATIN (KAR boe pla tin) is a chemotherapy drug. It targets fast dividing cells, like cancer cells, and causes these cells to die. This medicine is used to treat ovarian cancer and many other cancers. This medicine may be used for other purposes; ask your health care provider or pharmacist if you have questions. What should I tell my health care provider before I take this medicine? They need to know if you have any of these conditions: -blood disorders -hearing problems -kidney disease -recent or ongoing radiation therapy -an unusual or allergic reaction to carboplatin, cisplatin, other chemotherapy, other medicines, foods, dyes, or preservatives -pregnant or trying to get pregnant -breast-feeding How should I use this medicine? This drug is usually given as an infusion into a vein. It is administered in a hospital or clinic by a specially trained health care professional. Talk to your pediatrician regarding the use of this medicine in children. Special care may be needed. Overdosage: If you think you have taken too much of this medicine contact a poison control center or emergency room at once. NOTE: This medicine is only for you. Do not share this medicine with others. What if I miss a dose? It is important not to miss a dose. Call your doctor or health care professional if you are unable to keep an appointment. What may interact with this medicine? -medicines for seizures -medicines to increase blood counts like filgrastim, pegfilgrastim, sargramostim -some antibiotics like amikacin, gentamicin, neomycin, streptomycin,  tobramycin -vaccines Talk to your doctor or health care professional before taking any of these medicines: -acetaminophen -aspirin -ibuprofen -ketoprofen -naproxen This list may not describe all possible interactions. Give your health care provider a list of all the medicines, herbs, non-prescription drugs, or dietary supplements you use. Also tell them if you smoke, drink alcohol, or use illegal drugs. Some items may interact with your medicine. What should I watch for while using this medicine? Your condition will be monitored carefully while you are receiving this medicine. You will need important blood work done while you are taking this medicine. This drug may make you feel generally unwell. This is not uncommon, as chemotherapy can affect healthy cells as well as cancer cells. Report any side effects. Continue your course of treatment even though you feel ill unless your doctor tells you to stop. In some cases, you may be given additional medicines to help with side effects. Follow all directions for their use. Call your doctor or health care professional for advice if you get a fever, chills or sore throat, or other symptoms of a cold or flu. Do not treat yourself. This drug decreases your body's ability to fight infections. Try to avoid being around people who are sick. This medicine may increase your risk to bruise or bleed. Call your doctor or health care  professional if you notice any unusual bleeding. Be careful brushing and flossing your teeth or using a toothpick because you may get an infection or bleed more easily. If you have any dental work done, tell your dentist you are receiving this medicine. Avoid taking products that contain aspirin, acetaminophen, ibuprofen, naproxen, or ketoprofen unless instructed by your doctor. These medicines may hide a fever. Do not become pregnant while taking this medicine. Women should inform their doctor if they wish to become pregnant or think  they might be pregnant. There is a potential for serious side effects to an unborn child. Talk to your health care professional or pharmacist for more information. Do not breast-feed an infant while taking this medicine. What side effects may I notice from receiving this medicine? Side effects that you should report to your doctor or health care professional as soon as possible: -allergic reactions like skin rash, itching or hives, swelling of the face, lips, or tongue -signs of infection - fever or chills, cough, sore throat, pain or difficulty passing urine -signs of decreased platelets or bleeding - bruising, pinpoint red spots on the skin, black, tarry stools, nosebleeds -signs of decreased red blood cells - unusually weak or tired, fainting spells, lightheadedness -breathing problems -changes in hearing -changes in vision -chest pain -high blood pressure -low blood counts - This drug may decrease the number of white blood cells, red blood cells and platelets. You may be at increased risk for infections and bleeding. -nausea and vomiting -pain, swelling, redness or irritation at the injection site -pain, tingling, numbness in the hands or feet -problems with balance, talking, walking -trouble passing urine or change in the amount of urine Side effects that usually do not require medical attention (report to your doctor or health care professional if they continue or are bothersome): -hair loss -loss of appetite -metallic taste in the mouth or changes in taste This list may not describe all possible side effects. Call your doctor for medical advice about side effects. You may report side effects to FDA at 1-800-FDA-1088. Where should I keep my medicine? This drug is given in a hospital or clinic and will not be stored at home. NOTE: This sheet is a summary. It may not cover all possible information. If you have questions about this medicine, talk to your doctor, pharmacist, or health care  provider.  2012, Elsevier/Gold Standard. (03/07/2008 2:38:05 PM)

## 2013-04-19 ENCOUNTER — Encounter (HOSPITAL_COMMUNITY): Payer: Self-pay

## 2013-04-19 ENCOUNTER — Ambulatory Visit
Admission: RE | Admit: 2013-04-19 | Discharge: 2013-04-19 | Disposition: A | Discharge status: Home / Self Care | Payer: Medicaid Other | Source: Ambulatory Visit | Attending: Radiation Oncology | Admitting: Radiation Oncology

## 2013-04-19 ENCOUNTER — Emergency Department (INDEPENDENT_AMBULATORY_CARE_PROVIDER_SITE_OTHER)
Admission: EM | Admit: 2013-04-19 | Discharge: 2013-04-19 | Disposition: A | Discharge status: Home / Self Care | Payer: Medicaid Other | Source: Home / Self Care

## 2013-04-19 ENCOUNTER — Telehealth: Payer: Self-pay | Admitting: *Deleted

## 2013-04-19 ENCOUNTER — Other Ambulatory Visit: Payer: Self-pay

## 2013-04-19 ENCOUNTER — Encounter: Payer: Self-pay | Admitting: Radiation Oncology

## 2013-04-19 DIAGNOSIS — C3490 Malignant neoplasm of unspecified part of unspecified bronchus or lung: Secondary | ICD-10-CM

## 2013-04-19 DIAGNOSIS — E119 Type 2 diabetes mellitus without complications: Secondary | ICD-10-CM

## 2013-04-19 MED ORDER — RADIAPLEXRX EX GEL
Freq: Once | CUTANEOUS | Status: AC
  Administered 2013-04-19: 12:00:00 via TOPICAL

## 2013-04-19 MED ORDER — FREESTYLE SYSTEM KIT: 1.0000 | PACK | Status: DC | PRN

## 2013-04-19 NOTE — Progress Notes (Signed)
Post sim ed completed w/pt and family member, charted under pt education appt. Pt denies pain but c/o chronic numbness, tingling in hands and feet and "down his left thigh". He states his energy level, appetite fluctuate. He has dry cough, SOB w/exertion.

## 2013-04-19 NOTE — ED Notes (Signed)
Patient here to establish care History of lung cancer History of diabetes not chronic

## 2013-04-19 NOTE — Telephone Encounter (Signed)
Message copied by Kathlynn Grate on Tue Apr 19, 2013  5:35 PM ------      Message from: Adella Hare K      Created: Mon Apr 18, 2013 12:12 PM      Regarding: 1st Time chemo      Contact: [phone]       Taxol, carboplatin.   ------

## 2013-04-19 NOTE — ED Provider Notes (Signed)
History     CSN: [id number]  Arrival date & time 04/19/13  1631   First MD Initiated Contact with Patient 04/19/13 1718      Chief Complaint  Patient presents with  . Establish Care     HPI 57 year old male with stage III non-small cell lung cancer of the left and underwent chemoradiation (follows with Dr. Gaylyn Rong) with a history of diabetes mellitus which was recently diagnosed., History of smoking,  here to establish care. She was admitted back in March where he was found to have lung cancer. He was also noted to have diabetes mellitus with hyperglycemia likely in the setting of steroid use. Patient today denies any headache, blurry vision, dizziness, chest pain, palpitations, some shortness of breath on exertion, belly pain, nausea, vomiting, bowel or urinary symptoms. Does complain of some tingling in numbness of he is hands and feet.   Past Medical History  Diagnosis Date  . Back pain, chronic   . Diabetes mellitus, type II   . Mediastinal mass   . Anemia   . Numbness and tingling in hands   . Numbness and tingling of foot     bilateral  . Urinary retention   . COPD (chronic obstructive pulmonary disease)   . Lung cancer 04/07/2013    Past Surgical History  Procedure Laterality Date  . Video bronchoscopy with endobronchial ultrasound Bilateral 03/10/2013    Procedure: VIDEO BRONCHOSCOPY WITH ENDOBRONCHIAL ULTRASOUND;  Surgeon: Oretha Milch, MD;  Location: [facility] OR;  Service: Pulmonary;  Laterality: Bilateral;  . Mediastinotomy chamberlain mcneil Left 03/24/2013    Procedure: MEDIASTINOTOMY CHAMBERLAIN MCNEIL;  Surgeon: Loreli Slot, MD;  Location: [facility] OR;  Service: Thoracic;  Laterality: Left;  . Excisional hemorrhoidectomy      > 30 yrs ago    Family History  Problem Relation Age of Onset  . Aneurysm Mother   . Heart attack Father   . Aneurysm Sister   . Cancer Brother     History  Substance Use Topics  . Smoking status: Current Every Day Smoker -- 1.50  packs/day for 40 years    Types: Cigarettes    Start date: 07/18/1971  . Smokeless tobacco: Not on file     Comment: 04/11/13 trying to quit, 2-3 cigs daily now   . Alcohol Use: 3.6 oz/week    6 Cans of beer per week     Comment: 4-5 days ago Pt. reports having a couple of beers      Review of Systems As outlined in history of present illness Allergies  Review of patient's allergies indicates no known allergies.  Home Medications   Current Outpatient Rx  Name  Route  Sig  Dispense  Refill  . paclitaxel (TAXOL) 30 MG/5ML injection   Injection   Inject as directed once a week.         Marland Kitchen dexamethasone (DECADRON) 4 MG tablet   Oral   Take 1 tablet (4 mg total) by mouth 2 (two) times daily with a meal. Take 8 mg 2 times daily with meals starting the day after chemotherapy for 3 days.   30 tablet   1   . glimepiride (AMARYL) 1 MG tablet   Oral   Take 1 tablet (1 mg total) by mouth daily before breakfast.   30 tablet   3   . glucose monitoring kit (FREESTYLE) monitoring kit   Does not apply   1 each by Does not apply route as needed for other.  1 each   0   . hyaluronate sodium (RADIAPLEXRX) GEL   Topical   Apply topically 2 (two) times daily.         Marland Kitchen HYDROcodone-acetaminophen (NORCO) 5-325 MG per tablet   Oral   Take 1 tablet by mouth every 6 (six) hours as needed for pain.   90 tablet   0   . LORazepam (ATIVAN) 0.5 MG tablet   Oral   Take 1 tablet (0.5 mg total) by mouth every 6 (six) hours as needed. Take as needed for nausea and vomiting.   30 tablet   1   . Multiple Vitamin (MULTIVITAMIN WITH MINERALS) TABS   Oral   Take 1 tablet by mouth daily.   30 tablet   3   . ondansetron (ZOFRAN) 8 MG tablet   Oral   Take 1 tablet (8 mg total) by mouth every 12 (twelve) hours as needed for nausea. Take 1 tablet 2 times daily starting the day after chemotherapy.  Then take 1 tablet two times a day as needed for nausea and vomiting.   30 tablet   1   .  prochlorperazine (COMPAZINE) 10 MG tablet   Oral   Take 1 tablet (10 mg total) by mouth every 6 (six) hours as needed. Take as needed for nausea and vomiting.   30 tablet   1   . prochlorperazine (COMPAZINE) 25 MG suppository   Rectal   Place 1 suppository (25 mg total) rectally every 12 (twelve) hours as needed for nausea.   12 suppository   3   . tamsulosin (FLOMAX) 0.4 MG CAPS   Oral   Take 1 capsule (0.4 mg total) by mouth daily after supper.   30 capsule   3     BP 129/65  Pulse 53  Temp(Src) 97.3 F (36.3 C) (Oral)  Resp 16  SpO2 94%  Physical Exam Middle aged male in no acute distress HEENT: No pallor, moist oral mucosa, right temporal lipoma, supple neck Chest: Clear to auscultation bilaterally, no added sounds CVS: Normal S1 and S2, no murmurs or gallop Abdomen: Soft, nontender, nondistended, bowel sounds present Extremities: Warm, no edema CNS: AAO x3 ED Course  Procedures (including critical care time)  Labs Reviewed - No data to display No results found.   1. Diabetes mellitus   2. Lung cancer, unspecified laterality    hb A1c of 7.6. Patient on Amaryl. I would prescribe him with a glucometer, test strips and lancet. Also give him a log to monitor his fingersticks and bring it back on his next visit. Counseled on medication adherence and dietary  restrictions. He takes hemoglobin A1c and lipid panel during that visit  Non small cell lung ca , left Follows with Dr. Gaylyn Rong and getting chemoradiation    MDM  Follow up in 4-6 weeks        Eddie North, MD 04/19/13 2440

## 2013-04-19 NOTE — Telephone Encounter (Signed)
Attempted to call patient, no answer, left message to call us back if needed.

## 2013-04-19 NOTE — Telephone Encounter (Signed)
Message copied by Kathlynn Grate on Tue Apr 19, 2013  5:31 PM ------      Message from: Adella Hare K      Created: Mon Apr 18, 2013 12:12 PM      Regarding: 1st Time chemo      Contact: [phone]       Taxol, carboplatin.   ------

## 2013-04-19 NOTE — Progress Notes (Signed)
[facility]    Radiation Oncology [address] [place]     Maryln Gottron, M.D. [place], [place] [address]               Billie Lade, M.D., Ph.D. Phone: [phone]      Molli Hazard A. Kathrynn Running, M.D. Fax: [phone]      Radene Gunning, M.D., Ph.D.         Lurline Hare, M.D.         Grayland Jack, M.D Weekly Treatment Management Note  Name: Rodney Powers     MRN: [id number]        CSN: [id number] Date: 04/19/2013      DOB: 1956-02-27  CC: Default, Provider, MD         [name]    Status: Outpatient  Diagnosis: The encounter diagnosis was Lung cancer, left.  Current Dose: 1.8 Gy  Current Fraction: 1  Planned Dose: 63 Gy  Narrative: [name] was seen today for weekly treatment management. The chart was checked and CBCT  were reviewed. He is tolerating his radiation therapy well thus far.  Patient did receive chemotherapy earlier this week and also tolerated this well. The patient is scheduled to see Dr. Pollyann Kennedy on May 13 for evaluation of the uptake noted on PET scan in the throat region.  Review of patient's allergies indicates no known allergies.  Current Outpatient Prescriptions  Medication Sig Dispense Refill  . dexamethasone (DECADRON) 4 MG tablet Take 1 tablet (4 mg total) by mouth 2 (two) times daily with a meal. Take 8 mg 2 times daily with meals starting the day after chemotherapy for 3 days.  30 tablet  1  . glimepiride (AMARYL) 1 MG tablet Take 1 tablet (1 mg total) by mouth daily before breakfast.  30 tablet  3  . HYDROcodone-acetaminophen (NORCO) 5-325 MG per tablet Take 1 tablet by mouth every 6 (six) hours as needed for pain.  90 tablet  0  . LORazepam (ATIVAN) 0.5 MG tablet Take 1 tablet (0.5 mg total) by mouth every 6 (six) hours as needed. Take as needed for nausea and vomiting.  30 tablet  1  . Multiple Vitamin (MULTIVITAMIN WITH MINERALS) TABS Take 1 tablet by mouth daily.  30 tablet  3  . ondansetron (ZOFRAN) 8 MG tablet Take 1  tablet (8 mg total) by mouth every 12 (twelve) hours as needed for nausea. Take 1 tablet 2 times daily starting the day after chemotherapy.  Then take 1 tablet two times a day as needed for nausea and vomiting.  30 tablet  1  . prochlorperazine (COMPAZINE) 10 MG tablet Take 1 tablet (10 mg total) by mouth every 6 (six) hours as needed. Take as needed for nausea and vomiting.  30 tablet  1  . prochlorperazine (COMPAZINE) 25 MG suppository Place 1 suppository (25 mg total) rectally every 12 (twelve) hours as needed for nausea.  12 suppository  3  . tamsulosin (FLOMAX) 0.4 MG CAPS Take 1 capsule (0.4 mg total) by mouth daily after supper.  30 capsule  3   No current facility-administered medications for this encounter.   Labs:  Lab Results  Component Value Date   WBC 9.1 04/18/2013   HGB 9.2* 04/18/2013   HCT 29.3* 04/18/2013   MCV 71.7* 04/18/2013   PLT 412* 04/18/2013   Lab Results  Component Value Date   CREATININE 0.9 04/18/2013   BUN 8.1 04/18/2013   NA 134* 04/18/2013  K 3.5 04/18/2013   CL 98 04/18/2013   CO2 24 04/18/2013   Lab Results  Component Value Date   ALT 70* 03/23/2013   AST 30 03/23/2013   BILITOT 0.2* 03/23/2013    Physical Examination:  weight is 202 lb 6.4 oz (91.808 kg). His blood pressure is 129/75 and his pulse is 54. His respiration is 20 and oxygen saturation is 99%.    Wt Readings from Last 3 Encounters:  04/19/13 202 lb 6.4 oz (91.808 kg)  04/14/13 200 lb 6.4 oz (90.901 kg)  04/11/13 201 lb 11.2 oz (91.491 kg)    No palpable supraclavicular adenopathy. Lungs - Normal respiratory effort, chest expands symmetrically. Lungs are clear to auscultation, no crackles or wheezes.  Heart has regular rhythm and rate  Abdomen is soft and non tender with normal bowel sounds  Assessment:  Patient tolerating treatments well  Plan: Continue treatment per original radiation prescription

## 2013-04-19 NOTE — Progress Notes (Signed)
Post sim ed completed w/pt and family member. Gave pt "[facility] and You " booklet w/all pertinent information marked and discussed, re: fatigue, skin irritation/care, nutrition, pain, throat irritation/care. Gave pt radiaplex w/instructions for proper use. Pt and family member verbalized understanding.no questions verbalized.

## 2013-04-19 NOTE — Addendum Note (Signed)
Encounter addended by: Glennie Hawk, RN on: 04/19/2013 12:59 PM<BR>     Documentation filed: Inpatient MAR, Orders

## 2013-04-20 ENCOUNTER — Ambulatory Visit
Admission: RE | Admit: 2013-04-20 | Discharge: 2013-04-20 | Disposition: A | Discharge status: Home / Self Care | Payer: Medicaid Other | Source: Ambulatory Visit | Attending: Radiation Oncology | Admitting: Radiation Oncology

## 2013-04-20 ENCOUNTER — Other Ambulatory Visit: Payer: Self-pay | Admitting: Certified Registered Nurse Anesthetist

## 2013-04-20 ENCOUNTER — Ambulatory Visit (HOSPITAL_BASED_OUTPATIENT_CLINIC_OR_DEPARTMENT_OTHER): Payer: Medicaid Other | Admitting: Oncology

## 2013-04-20 DIAGNOSIS — C3490 Malignant neoplasm of unspecified part of unspecified bronchus or lung: Secondary | ICD-10-CM

## 2013-04-21 ENCOUNTER — Ambulatory Visit
Admission: RE | Admit: 2013-04-21 | Discharge: 2013-04-21 | Disposition: A | Discharge status: Home / Self Care | Payer: Medicaid Other | Source: Ambulatory Visit | Attending: Radiation Oncology | Admitting: Radiation Oncology

## 2013-04-21 NOTE — Progress Notes (Signed)
Appointment was moved up to 04/15/13.

## 2013-04-22 ENCOUNTER — Ambulatory Visit (HOSPITAL_BASED_OUTPATIENT_CLINIC_OR_DEPARTMENT_OTHER): Payer: Medicaid Other | Admitting: Oncology

## 2013-04-22 ENCOUNTER — Ambulatory Visit
Admission: RE | Admit: 2013-04-22 | Discharge: 2013-04-22 | Disposition: A | Discharge status: Home / Self Care | Payer: Medicaid Other | Source: Ambulatory Visit | Attending: Radiation Oncology | Admitting: Radiation Oncology

## 2013-04-22 ENCOUNTER — Other Ambulatory Visit (HOSPITAL_BASED_OUTPATIENT_CLINIC_OR_DEPARTMENT_OTHER): Payer: Medicaid Other | Admitting: Lab

## 2013-04-22 ENCOUNTER — Encounter: Payer: Self-pay | Admitting: Oncology

## 2013-04-22 DIAGNOSIS — C3400 Malignant neoplasm of unspecified main bronchus: Secondary | ICD-10-CM

## 2013-04-22 LAB — CBC WITH DIFFERENTIAL/PLATELET
BASO%: 0 % (ref 0.0–2.0)
Basophils Absolute: 0 10*3/uL (ref 0.0–0.1)
EOS%: 0.2 % (ref 0.0–7.0)
HGB: 9.6 g/dL — ABNORMAL LOW (ref 13.0–17.1)
MCH: 22.5 pg — ABNORMAL LOW (ref 27.2–33.4)
MCHC: 31.3 g/dL — ABNORMAL LOW (ref 32.0–36.0)
MCV: 71.9 fL — ABNORMAL LOW (ref 79.3–98.0)
MONO%: 4.8 % (ref 0.0–14.0)
RDW: 16.6 % — ABNORMAL HIGH (ref 11.0–14.6)

## 2013-04-22 LAB — BASIC METABOLIC PANEL (CC13)
BUN: 8 mg/dL (ref 7.0–26.0)
Calcium: 8.6 mg/dL (ref 8.4–10.4)
Glucose: 240 mg/dl — ABNORMAL HIGH (ref 70–99)
Sodium: 135 mEq/L — ABNORMAL LOW (ref 136–145)

## 2013-04-22 NOTE — Progress Notes (Signed)
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   PAST THERAPY:  Biopsy only.   CURRENT THERAPY:  Started concurrent chemoradiation on 04/18/13. Chemotherapy consists of weekly Carboplatin/Taxol.  INTERVAL HISTORY: Rodney [facility] 57 y.o. male returns for regular follow up.  Received first dose of Carboplatin/Taxol earlier this week.  He has mild fatigue, but remains independent in ADLs. He denied chest pain, cough, SOB, abd pain, bleeding symptoms.  No nausea/vomiting, diarrhea, or constipation. The rest of the 14-point review of system was negative. He reports that he only smokes about 2 cigarettes a daily.  He plans to quit completely without assistance.   Past Medical History  Diagnosis Date  . Back pain, chronic   . Diabetes mellitus, type II   . Mediastinal mass   . Anemia   . Numbness and tingling in hands   . Numbness and tingling of foot     bilateral  . Urinary retention   . COPD (chronic obstructive pulmonary disease)   . Lung cancer 04/07/2013    Past Surgical History  Procedure Laterality Date  . Video bronchoscopy with endobronchial ultrasound Bilateral 03/10/2013    Procedure: VIDEO BRONCHOSCOPY WITH ENDOBRONCHIAL ULTRASOUND;  Surgeon: Oretha Milch, MD;  Location: [facility] OR;  Service: Pulmonary;  Laterality: Bilateral;  . Mediastinotomy chamberlain mcneil Left 03/24/2013    Procedure: MEDIASTINOTOMY CHAMBERLAIN MCNEIL;  Surgeon: Loreli Slot, MD;  Location: [facility] OR;  Service: Thoracic;  Laterality: Left;  . Excisional hemorrhoidectomy      > 30 yrs ago    Current Outpatient Prescriptions  Medication Sig Dispense Refill  . dexamethasone (DECADRON) 4 MG tablet Take 1 tablet (4 mg total) by mouth 2 (two) times daily with a meal. Take 8 mg 2 times daily with meals starting the day after  chemotherapy for 3 days.  30 tablet  1  . glimepiride (AMARYL) 1 MG tablet Take 1 tablet (1 mg total) by mouth daily before breakfast.  30 tablet  3  . glucose monitoring kit (FREESTYLE) monitoring kit 1 each by Does not apply route as needed for other.  1 each  0  . hyaluronate sodium (RADIAPLEXRX) GEL Apply topically 2 (two) times daily.      Marland Kitchen HYDROcodone-acetaminophen (NORCO) 5-325 MG per tablet Take 1 tablet by mouth every 6 (six) hours as needed for pain.  90 tablet  0  . LORazepam (ATIVAN) 0.5 MG tablet Take 1 tablet (0.5 mg total) by mouth every 6 (six) hours as needed. Take as needed for nausea and vomiting.  30 tablet  1  . Multiple Vitamin (MULTIVITAMIN WITH MINERALS) TABS Take 1 tablet by mouth daily.  30 tablet  3  . ondansetron (ZOFRAN) 8 MG tablet Take 1 tablet (8 mg total) by mouth every 12 (twelve) hours as needed for nausea. Take 1 tablet 2 times daily starting the day after chemotherapy.  Then take 1 tablet two times a day as needed for nausea and vomiting.  30 tablet  1  . paclitaxel (TAXOL) 30 MG/5ML injection Inject as directed once a week.      . prochlorperazine (COMPAZINE) 10 MG tablet Take 1 tablet (10 mg total) by mouth every 6 (six) hours as needed. Take as needed for nausea and vomiting.  30 tablet  1  . prochlorperazine (COMPAZINE) 25 MG suppository Place  1 suppository (25 mg total) rectally every 12 (twelve) hours as needed for nausea.  12 suppository  3  . tamsulosin (FLOMAX) 0.4 MG CAPS Take 1 capsule (0.4 mg total) by mouth daily after supper.  30 capsule  3   No current facility-administered medications for this visit.    ALLERGIES:  has No Known Allergies.  REVIEW OF SYSTEMS:  The rest of the 14-point review of system was negative.   Filed Vitals:   04/22/13 0930  BP: 140/71  Pulse: 104  Temp: 98.9 F (37.2 C)  Resp: 20   Wt Readings from Last 3 Encounters:  04/22/13 204 lb 6.4 oz (92.715 kg)  04/19/13 202 lb 6.4 oz (91.808 kg)  04/14/13 200 lb 6.4  oz (90.901 kg)   ECOG Performance status: ECOG 1.   PHYSICAL EXAMINATION:  General: well-nourished man, in no acute distress. Eyes: no scleral icterus. ENT: There were no oropharyngeal lesions. Neck was without thyromegaly. Lymphatics: Negative cervical, supraclavicular or axillary adenopathy. Respiratory: lungs were clear bilaterally without wheezing or crackles. Cardiovascular: Regular rate and rhythm, S1/S2, without murmur, rub or gallop. There was no pedal edema. GI: abdomen was soft, flat, nontender, nondistended, without organomegaly. Muscoloskeletal: no spinal tenderness of palpation of vertebral spine. Skin exam was without echymosis, petichae. Neuro exam was nonfocal. Patient was able to get on and off exam table without assistance. Gait was normal. Patient was alerted and oriented. Attention was good. Language was appropriate. Mood was normal without depression. Speech was not pressured. Thought content was not tangential.   LABORATORY/RADIOLOGY DATA:  Lab Results  Component Value Date   WBC 10.8* 04/22/2013   HGB 9.6* 04/22/2013   HCT 30.7* 04/22/2013   PLT 519* 04/22/2013   GLUCOSE 240* 04/22/2013   ALKPHOS 103 03/23/2013   ALT 70* 03/23/2013   AST 30 03/23/2013   NA 135* 04/22/2013   K 3.4* 04/22/2013   CL 101 04/22/2013   CREATININE 0.9 04/22/2013   BUN 8.0 04/22/2013   CO2 24 04/22/2013   INR 1.01 03/23/2013   HGBA1C 7.6* 03/06/2013     ASSESSMENT AND PLAN:   1.  Diagnosis:  Squamous cell carcinoma of the lung.  Need to rule out cancer from head and neck as well. 2.  Work up:  Need to see Dr. Pollyann Kennedy from Ear/Nose/Throat. Has appointment scheduled for 04/26/13. 3.  Treatment for lung cancer:  Concurrent chemoradiation.  *  Chemo:  Carboplatin/Taxol once a week for the duration of radiation for about 5-6 weeks.  *  S/P 1 dose of Carbo/Taxol. He has grade 1 fatigue. Recommend that he proceed with Carbo/Taxol without dose modification on 04/25/13. 4.  To assess response:  Repeat CT scan after finish  chemoradiation.   5.  Follow up: Once a week while on chemo.   The length of time of the face-to-face encounter was 15 minutes. More than 50% of time was spent counseling and coordination of care.

## 2013-04-23 ENCOUNTER — Ambulatory Visit
Admission: RE | Admit: 2013-04-23 | Discharge: 2013-04-23 | Disposition: A | Discharge status: Home / Self Care | Payer: Medicaid Other | Source: Ambulatory Visit | Attending: Radiation Oncology | Admitting: Radiation Oncology

## 2013-04-25 ENCOUNTER — Ambulatory Visit (HOSPITAL_BASED_OUTPATIENT_CLINIC_OR_DEPARTMENT_OTHER): Payer: Medicaid Other

## 2013-04-25 ENCOUNTER — Ambulatory Visit
Admission: RE | Admit: 2013-04-25 | Discharge: 2013-04-25 | Disposition: A | Discharge status: Home / Self Care | Payer: Medicaid Other | Source: Ambulatory Visit | Attending: Radiation Oncology | Admitting: Radiation Oncology

## 2013-04-25 DIAGNOSIS — C3410 Malignant neoplasm of upper lobe, unspecified bronchus or lung: Secondary | ICD-10-CM

## 2013-04-25 DIAGNOSIS — Z5111 Encounter for antineoplastic chemotherapy: Secondary | ICD-10-CM

## 2013-04-25 MED ORDER — DEXTROSE 5 % IV SOLN: 45.0000 mg/m2 | Freq: Once | INTRAVENOUS | Status: DC

## 2013-04-25 MED ORDER — FAMOTIDINE IN NACL 20-0.9 MG/50ML-% IV SOLN
20.0000 mg | Freq: Once | INTRAVENOUS | Status: AC
  Administered 2013-04-25: 20 mg via INTRAVENOUS

## 2013-04-25 MED ORDER — DEXAMETHASONE SODIUM PHOSPHATE 20 MG/5ML IJ SOLN
20.0000 mg | Freq: Once | INTRAMUSCULAR | Status: AC
  Administered 2013-04-25: 20 mg via INTRAVENOUS

## 2013-04-25 MED ORDER — SODIUM CHLORIDE 0.9 % IV SOLN
1000.0000 mL | INTRAVENOUS | Status: DC
  Administered 2013-04-25: 11:00:00 via INTRAVENOUS

## 2013-04-25 MED ORDER — SODIUM CHLORIDE 0.9 % IV SOLN
102.0000 mg | Freq: Once | INTRAVENOUS | Status: AC
  Administered 2013-04-25: 102 mg via INTRAVENOUS
  Filled 2013-04-25: qty 17

## 2013-04-25 MED ORDER — ONDANSETRON 16 MG/50ML IVPB (CHCC)
16.0000 mg | Freq: Once | INTRAVENOUS | Status: AC
  Administered 2013-04-25: 16 mg via INTRAVENOUS

## 2013-04-25 MED ORDER — SODIUM CHLORIDE 0.9 % IV SOLN
Freq: Once | INTRAVENOUS | Status: AC
  Administered 2013-04-25: 10:00:00 via INTRAVENOUS

## 2013-04-25 MED ORDER — DIPHENHYDRAMINE HCL 50 MG/ML IJ SOLN
50.0000 mg | Freq: Once | INTRAMUSCULAR | Status: AC
  Administered 2013-04-25: 50 mg via INTRAVENOUS

## 2013-04-25 MED ORDER — SODIUM CHLORIDE 0.9 % IV SOLN
290.0000 mg | Freq: Once | INTRAVENOUS | Status: AC
  Administered 2013-04-25: 290 mg via INTRAVENOUS
  Filled 2013-04-25: qty 29

## 2013-04-25 NOTE — Patient Instructions (Addendum)
[facility] Discharge Instructions for Patients Receiving Chemotherapy  Today you received the following chemotherapy agents:  Taxol and Carboplatin  To help prevent nausea and vomiting after your treatment, we encourage you to take your nausea medication as ordered per MD. .   If you develop nausea and vomiting that is not controlled by your nausea medication, call the clinic. If it is after clinic hours your family physician or the after hours number for the clinic or go to the Emergency Department.   BELOW ARE SYMPTOMS THAT SHOULD BE REPORTED IMMEDIATELY:  *FEVER GREATER THAN 100.5 F  *CHILLS WITH OR WITHOUT FEVER  NAUSEA AND VOMITING THAT IS NOT CONTROLLED WITH YOUR NAUSEA MEDICATION  *UNUSUAL SHORTNESS OF BREATH  *UNUSUAL BRUISING OR BLEEDING  TENDERNESS IN MOUTH AND THROAT WITH OR WITHOUT PRESENCE OF ULCERS  *URINARY PROBLEMS  *BOWEL PROBLEMS  UNUSUAL RASH Items with * indicate a potential emergency and should be followed up as soon as possible.  . Please let the nurse know about any problems that you may have experienced. Feel free to call the clinic you have any questions or concerns. The clinic phone number is [phone].   I have been informed and understand all the instructions given to me. I know to contact the clinic, my physician, or go to the Emergency Department if any problems should occur. I do not have any questions at this time, but understand that I may call the clinic during office hours   should I have any questions or need assistance in obtaining follow up care.    Medications to take to prevent nausea and vomiting:  ZOFRAN (ONDANSETRON)-  TAKE 1 TABLET TWICE A DAY STARTING THE DAY AFTER CHEMO FOR 3 DAYS THEN AS NEEDED FOR NAUSEA AND VOMITING   DECADRON (DEXAMETHASONE)-  TAKE 2 TABLETS BY MOUTH TWICE A DAY WITH MEALS STARTING 2 DAYS AFTER CHEMO FOR 3 DAYS   COMPAZINE (PROCHLORAPERAZINE)-  TAKE 1 TABLET BY MOUTH EVERY 6 HOURS AS  NEEDED FOR NAUSEA OR VOMITING   ATIVAN (LORAZEPAM)-  TAKE 1 TABLET BY MOUTH EVERY 6 HOURS AS NEEDED FOR NAUSEA OR VOMITING     Monday                        Tuesday                                      Wednesday                                Thursday                                                                  Before breakfast- Zofran          Before breakfast- Zofran           Before breakfast- Zofran  Before dinner- Zofran               Before dinner- Zofran                Before dinner- Zofran                                                                               With breakfast- Decadron          With breakfast- Decadron                                                                                       With dinner-Decadron                With dinner- Decadron                                               Friday  With breakast-Decadron With dinner-Decadron  Compazine OR Ativan may be taken every 6 hrs as needed for nausea.  May alternate Compazine and Ativan.

## 2013-04-25 NOTE — Progress Notes (Signed)
1030-Dr. Ha notified of BP-103/70, also that pt states he has had nausea and vomiting since Wednesday despite taking anti-emetics.  Pt unsure of what anti-emetics he has taken this week.  He does have Zofran, Compazine, Ativan and Decadron on hand ordered per Dr. Gaylyn Rong.  Order received to give patient additional 500 ml of NS with chemo treatment today.  Will review anti emetics with patient prior to discharge from infusion room today.  Anti-emetics reviewed with patient and pt.'s friend.  Both are having difficulty with teach back regarding anti-emetics.  Instructions on how to take anti-emetics typed on AVS and reviewed with patient and his friend.  Pt to see Dr. Roselind Messier tomorrow with radiation treatment.  Radiation notified of pt condition today and will continue to monitor pt closely while in radiation.

## 2013-04-26 ENCOUNTER — Ambulatory Visit
Admission: RE | Admit: 2013-04-26 | Discharge: 2013-04-26 | Disposition: A | Discharge status: Home / Self Care | Payer: Medicaid Other | Source: Ambulatory Visit | Attending: Radiation Oncology | Admitting: Radiation Oncology

## 2013-04-26 ENCOUNTER — Encounter: Payer: Self-pay | Admitting: Radiation Oncology

## 2013-04-26 NOTE — Progress Notes (Signed)
[facility]    Radiation Oncology [address] [place]     Maryln Gottron, M.D. [place], [place] [address]               Billie Lade, M.D., Ph.D. Phone: [phone]      Molli Hazard A. Kathrynn Running, M.D. Fax: [phone]      Radene Gunning, M.D., Ph.D.         Lurline Hare, M.D.         Grayland Jack, M.D Weekly Treatment Management Note  Name: [facility]     MRN: [id number]        CSN: [id number] Date: 04/26/2013      DOB: October 03, 1956  CC: Default, Provider, MD         [name]    Status: Outpatient  Diagnosis: The encounter diagnosis was Lung cancer, left.  Current Dose: 10.8 Gy  Current Fraction: 6  Planned Dose: 63 Gy  Narrative: [name] was seen today for weekly treatment management. The chart was checked and CBCT  were reviewed. He has had some mild nausea and occasionally will gag.  He did receive chemotherapy and IV fluids yesterday. Patient has loss approximately 12 pounds over the past week. Patient is been eating relatively well and is keeping his fluid intake up. I question whether the scales may have been inaccurate.  He denies any swallowing difficulties or pain with swallowing  Review of patient's allergies indicates no known allergies.  Current Outpatient Prescriptions  Medication Sig Dispense Refill  . dexamethasone (DECADRON) 4 MG tablet Take 1 tablet (4 mg total) by mouth 2 (two) times daily with a meal. Take 8 mg 2 times daily with meals starting the day after chemotherapy for 3 days.  30 tablet  1  . glimepiride (AMARYL) 1 MG tablet Take 1 tablet (1 mg total) by mouth daily before breakfast.  30 tablet  3  . glucose monitoring kit (FREESTYLE) monitoring kit 1 each by Does not apply route as needed for other.  1 each  0  . hyaluronate sodium (RADIAPLEXRX) GEL Apply topically 2 (two) times daily.      Marland Kitchen HYDROcodone-acetaminophen (NORCO) 5-325 MG per tablet Take 1 tablet by mouth every 6 (six) hours as needed for pain.  90 tablet   0  . LORazepam (ATIVAN) 0.5 MG tablet Take 1 tablet (0.5 mg total) by mouth every 6 (six) hours as needed. Take as needed for nausea and vomiting.  30 tablet  1  . Multiple Vitamin (MULTIVITAMIN WITH MINERALS) TABS Take 1 tablet by mouth daily.  30 tablet  3  . ondansetron (ZOFRAN) 8 MG tablet Take 1 tablet (8 mg total) by mouth every 12 (twelve) hours as needed for nausea. Take 1 tablet 2 times daily starting the day after chemotherapy.  Then take 1 tablet two times a day as needed for nausea and vomiting.  30 tablet  1  . paclitaxel (TAXOL) 30 MG/5ML injection Inject as directed once a week.      . prochlorperazine (COMPAZINE) 10 MG tablet Take 1 tablet (10 mg total) by mouth every 6 (six) hours as needed. Take as needed for nausea and vomiting.  30 tablet  1  . prochlorperazine (COMPAZINE) 25 MG suppository Place 1 suppository (25 mg total) rectally every 12 (twelve) hours as needed for nausea.  12 suppository  3  . tamsulosin (FLOMAX) 0.4 MG CAPS Take 1 capsule (0.4 mg total) by mouth daily after supper.  30 capsule  3   No current facility-administered medications for this encounter.   Labs:  Lab Results  Component Value Date   WBC 10.8* 04/22/2013   HGB 9.6* 04/22/2013   HCT 30.7* 04/22/2013   MCV 71.9* 04/22/2013   PLT 519* 04/22/2013   Lab Results  Component Value Date   CREATININE 0.9 04/22/2013   BUN 8.0 04/22/2013   NA 135* 04/22/2013   K 3.4* 04/22/2013   CL 101 04/22/2013   CO2 24 04/22/2013   Lab Results  Component Value Date   ALT 70* 03/23/2013   AST 30 03/23/2013   BILITOT 0.2* 03/23/2013    Physical Examination:  weight is 193 lb 11.2 oz (87.862 kg). His blood pressure is 118/81 and his pulse is 95. His respiration is 20.    Wt Readings from Last 3 Encounters:  04/26/13 193 lb 11.2 oz (87.862 kg)  04/22/13 204 lb 6.4 oz (92.715 kg)  04/19/13 202 lb 6.4 oz (91.808 kg)    The oral cavity is moist without secondary infection. Lungs - Normal respiratory effort, chest expands  symmetrically. Lungs are clear to auscultation, no crackles or wheezes.  Heart has regular rhythm and rate  Abdomen is soft and non tender with normal bowel sounds  Assessment:  Patient tolerating treatments well  except for above issues  Plan: Continue treatment per original radiation prescription.  The patient will meet with the nutritionist in the near future.

## 2013-04-26 NOTE — Progress Notes (Addendum)
Pt had chemo and IVF yesterday, is taking Compazine regularly; he states he was slightly nauseated this morning. Pt denies pain, cough, does have SOB w/exertion, loss of appetite, fatigue. Spoke w/[employer], nutritionist, and she will touch base w/pt re: nutrition needs.

## 2013-04-27 ENCOUNTER — Ambulatory Visit
Admission: RE | Admit: 2013-04-27 | Discharge: 2013-04-27 | Disposition: A | Discharge status: Home / Self Care | Payer: Medicaid Other | Source: Ambulatory Visit | Attending: Radiation Oncology | Admitting: Radiation Oncology

## 2013-04-28 ENCOUNTER — Ambulatory Visit
Admission: RE | Admit: 2013-04-28 | Discharge: 2013-04-28 | Disposition: A | Discharge status: Home / Self Care | Payer: Medicaid Other | Source: Ambulatory Visit | Attending: Radiation Oncology | Admitting: Radiation Oncology

## 2013-04-29 ENCOUNTER — Ambulatory Visit (HOSPITAL_BASED_OUTPATIENT_CLINIC_OR_DEPARTMENT_OTHER): Payer: Medicaid Other | Admitting: Oncology

## 2013-04-29 ENCOUNTER — Telehealth: Payer: Self-pay | Admitting: Oncology

## 2013-04-29 ENCOUNTER — Ambulatory Visit
Admission: RE | Admit: 2013-04-29 | Discharge: 2013-04-29 | Disposition: A | Discharge status: Home / Self Care | Payer: Medicaid Other | Source: Ambulatory Visit | Attending: Radiation Oncology | Admitting: Radiation Oncology

## 2013-04-29 ENCOUNTER — Other Ambulatory Visit (HOSPITAL_BASED_OUTPATIENT_CLINIC_OR_DEPARTMENT_OTHER): Payer: Medicaid Other

## 2013-04-29 DIAGNOSIS — C3490 Malignant neoplasm of unspecified part of unspecified bronchus or lung: Secondary | ICD-10-CM

## 2013-04-29 LAB — CBC WITH DIFFERENTIAL/PLATELET
BASO%: 0.1 % (ref 0.0–2.0)
HCT: 30.2 % — ABNORMAL LOW (ref 38.4–49.9)
LYMPH%: 14.4 % (ref 14.0–49.0)
MCHC: 31.4 g/dL — ABNORMAL LOW (ref 32.0–36.0)
MCV: 72.2 fL — ABNORMAL LOW (ref 79.3–98.0)
MONO#: 0.2 10*3/uL (ref 0.1–0.9)
MONO%: 3 % (ref 0.0–14.0)
NEUT%: 82.3 % — ABNORMAL HIGH (ref 39.0–75.0)
Platelets: 383 10*3/uL (ref 140–400)
WBC: 6.8 10*3/uL (ref 4.0–10.3)

## 2013-04-29 LAB — COMPREHENSIVE METABOLIC PANEL (CC13)
ALT: 85 U/L — ABNORMAL HIGH (ref 0–55)
CO2: 24 mEq/L (ref 22–29)
Creatinine: 0.9 mg/dL (ref 0.7–1.3)
Glucose: 142 mg/dl — ABNORMAL HIGH (ref 70–99)
Total Bilirubin: 0.6 mg/dL (ref 0.20–1.20)

## 2013-04-29 MED ORDER — NICOTINE 7 MG/24HR TD PT24: 1.0000 | MEDICATED_PATCH | TRANSDERMAL | Status: DC

## 2013-04-29 MED ORDER — NICOTINE 14 MG/24HR TD PT24: 1.0000 | MEDICATED_PATCH | TRANSDERMAL | Status: DC

## 2013-04-29 NOTE — Progress Notes (Signed)
[facility]  Telephone:(336) [phone] Fax:(336) [phone]   OFFICE PROGRESS NOTE   Cc:  Default, Provider, MD  DIAGNOSIS:  Stage III nonsmall cell lung cancer the left lung; squamous cell carcinoma.   PAST THERAPY:  Biopsy only.   CURRENT THERAPY:  Started concurrent chemoradiation on 04/18/13. Chemotherapy consists of weekly Carboplatin/Taxol.  INTERVAL HISTORY: Rodney Powers 57 y.o. male returns for regular follow up.  Received Carboplatin/Taxol along with radiation and seems to be tolerating this well.  He has mild fatigue, but remains independent in ADLs. He denied chest pain, cough, SOB, abd pain, bleeding symptoms.  No nausea/vomiting, diarrhea, or constipation. The rest of the 14-point review of system was negative. He reports that he only smokes about 2-3 cigarettes a daily.  He want to quit without assistance, but is unable to do so. He has requested nicotine patches.  Past Medical History  Diagnosis Date  . Back pain, chronic   . Diabetes mellitus, type II   . Mediastinal mass   . Anemia   . Numbness and tingling in hands   . Numbness and tingling of foot     bilateral  . Urinary retention   . COPD (chronic obstructive pulmonary disease)   . Lung cancer 04/07/2013    Past Surgical History  Procedure Laterality Date  . Video bronchoscopy with endobronchial ultrasound Bilateral 03/10/2013    Procedure: VIDEO BRONCHOSCOPY WITH ENDOBRONCHIAL ULTRASOUND;  Surgeon: Oretha Milch, MD;  Location: [facility] OR;  Service: Pulmonary;  Laterality: Bilateral;  . Mediastinotomy chamberlain mcneil Left 03/24/2013    Procedure: MEDIASTINOTOMY CHAMBERLAIN MCNEIL;  Surgeon: Loreli Slot, MD;  Location: [facility] OR;  Service: Thoracic;  Laterality: Left;  . Excisional hemorrhoidectomy      > 30 yrs ago    Current Outpatient Prescriptions  Medication Sig Dispense Refill  . dexamethasone (DECADRON) 4 MG tablet Take 1 tablet (4 mg total) by mouth 2 (two) times daily  with a meal. Take 8 mg 2 times daily with meals starting the day after chemotherapy for 3 days.  30 tablet  1  . glimepiride (AMARYL) 1 MG tablet Take 1 tablet (1 mg total) by mouth daily before breakfast.  30 tablet  3  . glucose monitoring kit (FREESTYLE) monitoring kit 1 each by Does not apply route as needed for other.  1 each  0  . hyaluronate sodium (RADIAPLEXRX) GEL Apply topically 2 (two) times daily.      Marland Kitchen HYDROcodone-acetaminophen (NORCO) 5-325 MG per tablet Take 1 tablet by mouth every 6 (six) hours as needed for pain.  90 tablet  0  . LORazepam (ATIVAN) 0.5 MG tablet Take 1 tablet (0.5 mg total) by mouth every 6 (six) hours as needed. Take as needed for nausea and vomiting.  30 tablet  1  . Multiple Vitamin (MULTIVITAMIN WITH MINERALS) TABS Take 1 tablet by mouth daily.  30 tablet  3  . nicotine (NICODERM CQ) 14 mg/24hr patch Place 1 patch onto the skin daily.  28 patch  1  . nicotine (NICODERM CQ) 7 mg/24hr patch Place 1 patch onto the skin daily.  28 patch  0  . ondansetron (ZOFRAN) 8 MG tablet Take 1 tablet (8 mg total) by mouth every 12 (twelve) hours as needed for nausea. Take 1 tablet 2 times daily starting the day after chemotherapy.  Then take 1 tablet two times a day as needed for nausea and vomiting.  30 tablet  1  . paclitaxel (TAXOL) 30  MG/5ML injection Inject as directed once a week.      . prochlorperazine (COMPAZINE) 10 MG tablet Take 1 tablet (10 mg total) by mouth every 6 (six) hours as needed. Take as needed for nausea and vomiting.  30 tablet  1  . prochlorperazine (COMPAZINE) 25 MG suppository Place 1 suppository (25 mg total) rectally every 12 (twelve) hours as needed for nausea.  12 suppository  3  . tamsulosin (FLOMAX) 0.4 MG CAPS Take 1 capsule (0.4 mg total) by mouth daily after supper.  30 capsule  3   No current facility-administered medications for this visit.    ALLERGIES:  has No Known Allergies.  REVIEW OF SYSTEMS:  The rest of the 14-point review of  system was negative.   Filed Vitals:   04/29/13 0901  BP: 110/60  Pulse: 78  Temp: 97 F (36.1 C)  Resp: 18   Wt Readings from Last 3 Encounters:  04/29/13 197 lb (89.359 kg)  04/26/13 193 lb 11.2 oz (87.862 kg)  04/22/13 204 lb 6.4 oz (92.715 kg)   ECOG Performance status: ECOG 1.   PHYSICAL EXAMINATION:  General: well-nourished man, in no acute distress. Eyes: no scleral icterus. ENT: There were no oropharyngeal lesions. Neck was without thyromegaly. Lymphatics: Negative cervical, supraclavicular or axillary adenopathy. Respiratory: lungs were clear bilaterally without wheezing or crackles. Cardiovascular: Regular rate and rhythm, S1/S2, without murmur, rub or gallop. There was no pedal edema. GI: abdomen was soft, flat, nontender, nondistended, without organomegaly. Muscoloskeletal: no spinal tenderness of palpation of vertebral spine. Skin exam was without echymosis, petichae. Neuro exam was nonfocal. Patient was able to get on and off exam table without assistance. Gait was normal. Patient was alerted and oriented. Attention was good. Language was appropriate. Mood was normal without depression. Speech was not pressured. Thought content was not tangential.   LABORATORY/RADIOLOGY DATA:  Lab Results  Component Value Date   WBC 6.8 04/29/2013   HGB 9.5* 04/29/2013   HCT 30.2* 04/29/2013   PLT 383 04/29/2013   GLUCOSE 142* 04/29/2013   ALKPHOS 104 04/29/2013   ALT 85* 04/29/2013   AST 16 04/29/2013   NA 137 04/29/2013   K 3.7 04/29/2013   CL 103 04/29/2013   CREATININE 0.9 04/29/2013   BUN 16.1 04/29/2013   CO2 24 04/29/2013   INR 1.01 03/23/2013   HGBA1C 7.6* 03/06/2013     ASSESSMENT AND PLAN:   1.  Diagnosis:  Squamous cell carcinoma of the lung.  Need to rule out cancer from head and neck as well. 2.  Work up:  Has been seen by Dr. Pollyann Kennedy. Full note is not available to me, but the patient states that he has a small spot in his throat. This may need a biopsy of the later date once his  current treatment has been completed. 3.  Treatment for lung cancer:  Concurrent chemoradiation.  *  Chemo:  Carboplatin/Taxol once a week for the duration of radiation for about 5-6 weeks.  *  S/P 2 doses of Carbo/Taxol. He has grade 1 fatigue. Recommend that he proceed with Carbo/Taxol without dose modification on 05/02/13. 4.  To assess response:  Repeat CT scan after finish chemoradiation.   5.   smoking cessation: The patient would like to stop smoking and has been unable to do so without assistance. I have prescribed Nicoderm patches.  6.  Follow up: Once a week while on chemo.   The length of time of the face-to-face encounter was 15 minutes.  More than 50% of time was spent counseling and coordination of care.

## 2013-05-02 ENCOUNTER — Ambulatory Visit
Admission: RE | Admit: 2013-05-02 | Discharge: 2013-05-02 | Disposition: A | Discharge status: Home / Self Care | Payer: Medicaid Other | Source: Ambulatory Visit | Attending: Radiation Oncology | Admitting: Radiation Oncology

## 2013-05-02 ENCOUNTER — Ambulatory Visit (INDEPENDENT_AMBULATORY_CARE_PROVIDER_SITE_OTHER): Payer: Medicaid Other | Admitting: Pulmonary Disease

## 2013-05-02 ENCOUNTER — Ambulatory Visit: Payer: Medicaid Other | Admitting: Nutrition

## 2013-05-02 ENCOUNTER — Encounter: Payer: Self-pay | Admitting: Pulmonary Disease

## 2013-05-02 ENCOUNTER — Ambulatory Visit (HOSPITAL_BASED_OUTPATIENT_CLINIC_OR_DEPARTMENT_OTHER): Payer: Medicaid Other

## 2013-05-02 VITALS — BP 114/72 | HR 74 | Temp 97.3°F | Ht 75.0 in | Wt 206.0 lb

## 2013-05-02 DIAGNOSIS — C3490 Malignant neoplasm of unspecified part of unspecified bronchus or lung: Secondary | ICD-10-CM

## 2013-05-02 DIAGNOSIS — C3410 Malignant neoplasm of upper lobe, unspecified bronchus or lung: Secondary | ICD-10-CM

## 2013-05-02 DIAGNOSIS — J449 Chronic obstructive pulmonary disease, unspecified: Secondary | ICD-10-CM | POA: Insufficient documentation

## 2013-05-02 DIAGNOSIS — R7309 Other abnormal glucose: Secondary | ICD-10-CM

## 2013-05-02 DIAGNOSIS — Z5111 Encounter for antineoplastic chemotherapy: Secondary | ICD-10-CM

## 2013-05-02 DIAGNOSIS — R739 Hyperglycemia, unspecified: Secondary | ICD-10-CM

## 2013-05-02 MED ORDER — FAMOTIDINE IN NACL 20-0.9 MG/50ML-% IV SOLN
20.0000 mg | Freq: Once | INTRAVENOUS | Status: AC
Start: 2013-05-02 — End: 2013-05-02
  Administered 2013-05-02: 20 mg via INTRAVENOUS

## 2013-05-02 MED ORDER — SODIUM CHLORIDE 0.9 % IV SOLN
290.0000 mg | Freq: Once | INTRAVENOUS | Status: AC
  Administered 2013-05-02: 290 mg via INTRAVENOUS
  Filled 2013-05-02: qty 29

## 2013-05-02 MED ORDER — DEXAMETHASONE SODIUM PHOSPHATE 20 MG/5ML IJ SOLN
20.0000 mg | Freq: Once | INTRAMUSCULAR | Status: AC
  Administered 2013-05-02: 20 mg via INTRAVENOUS

## 2013-05-02 MED ORDER — SODIUM CHLORIDE 0.9 % IV SOLN
45.0000 mg/m2 | Freq: Once | INTRAVENOUS | Status: AC
  Administered 2013-05-02: 102 mg via INTRAVENOUS
  Filled 2013-05-02: qty 17

## 2013-05-02 MED ORDER — SODIUM CHLORIDE 0.9 % IV SOLN
Freq: Once | INTRAVENOUS | Status: AC
  Administered 2013-05-02: 09:00:00 via INTRAVENOUS

## 2013-05-02 MED ORDER — ONDANSETRON 16 MG/50ML IVPB (CHCC)
16.0000 mg | Freq: Once | INTRAVENOUS | Status: AC
  Administered 2013-05-02: 16 mg via INTRAVENOUS

## 2013-05-02 MED ORDER — DIPHENHYDRAMINE HCL 50 MG/ML IJ SOLN
50.0000 mg | Freq: Once | INTRAMUSCULAR | Status: AC
  Administered 2013-05-02: 50 mg via INTRAVENOUS

## 2013-05-02 NOTE — Patient Instructions (Addendum)
Carboplatin injection What is this medicine? CARBOPLATIN (KAR boe pla tin) is a chemotherapy drug. It targets fast dividing cells, like cancer cells, and causes these cells to die. This medicine is used to treat ovarian cancer and many other cancers. This medicine may be used for other purposes; ask your health care provider or pharmacist if you have questions. What should I tell my health care provider before I take this medicine? They need to know if you have any of these conditions: -blood disorders -hearing problems -kidney disease -recent or ongoing radiation therapy -an unusual or allergic reaction to carboplatin, cisplatin, other chemotherapy, other medicines, foods, dyes, or preservatives -pregnant or trying to get pregnant -breast-feeding How should I use this medicine? This drug is usually given as an infusion into a vein. It is administered in a hospital or clinic by a specially trained health care professional. Talk to your pediatrician regarding the use of this medicine in children. Special care may be needed. Overdosage: If you think you have taken too much of this medicine contact a poison control center or emergency room at once. NOTE: This medicine is only for you. Do not share this medicine with others. What if I miss a dose? It is important not to miss a dose. Call your doctor or health care professional if you are unable to keep an appointment. What may interact with this medicine? -medicines for seizures -medicines to increase blood counts like filgrastim, pegfilgrastim, sargramostim -some antibiotics like amikacin, gentamicin, neomycin, streptomycin, tobramycin -vaccines Talk to your doctor or health care professional before taking any of these medicines: -acetaminophen -aspirin -ibuprofen -ketoprofen -naproxen This list may not describe all possible interactions. Give your health care provider a list of all the medicines, herbs, non-prescription drugs, or dietary  supplements you use. Also tell them if you smoke, drink alcohol, or use illegal drugs. Some items may interact with your medicine. What should I watch for while using this medicine? Your condition will be monitored carefully while you are receiving this medicine. You will need important blood work done while you are taking this medicine. This drug may make you feel generally unwell. This is not uncommon, as chemotherapy can affect healthy cells as well as cancer cells. Report any side effects. Continue your course of treatment even though you feel ill unless your doctor tells you to stop. In some cases, you may be given additional medicines to help with side effects. Follow all directions for their use. Call your doctor or health care professional for advice if you get a fever, chills or sore throat, or other symptoms of a cold or flu. Do not treat yourself. This drug decreases your body's ability to fight infections. Try to avoid being around people who are sick. This medicine may increase your risk to bruise or bleed. Call your doctor or health care professional if you notice any unusual bleeding. Be careful brushing and flossing your teeth or using a toothpick because you may get an infection or bleed more easily. If you have any dental work done, tell your dentist you are receiving this medicine. Avoid taking products that contain aspirin, acetaminophen, ibuprofen, naproxen, or ketoprofen unless instructed by your doctor. These medicines may hide a fever. Do not become pregnant while taking this medicine. Women should inform their doctor if they wish to become pregnant or think they might be pregnant. There is a potential for serious side effects to an unborn child. Talk to your health care professional or pharmacist for more information.   Do not breast-feed an infant while taking this medicine. What side effects may I notice from receiving this medicine? Side effects that you should report to your  doctor or health care professional as soon as possible: -allergic reactions like skin rash, itching or hives, swelling of the face, lips, or tongue -signs of infection - fever or chills, cough, sore throat, pain or difficulty passing urine -signs of decreased platelets or bleeding - bruising, pinpoint red spots on the skin, black, tarry stools, nosebleeds -signs of decreased red blood cells - unusually weak or tired, fainting spells, lightheadedness -breathing problems -changes in hearing -changes in vision -chest pain -high blood pressure -low blood counts - This drug may decrease the number of white blood cells, red blood cells and platelets. You may be at increased risk for infections and bleeding. -nausea and vomiting -pain, swelling, redness or irritation at the injection site -pain, tingling, numbness in the hands or feet -problems with balance, talking, walking -trouble passing urine or change in the amount of urine Side effects that usually do not require medical attention (report to your doctor or health care professional if they continue or are bothersome): -hair loss -loss of appetite -metallic taste in the mouth or changes in taste This list may not describe all possible side effects. Call your doctor for medical advice about side effects. You may report side effects to FDA at 1-800-FDA-1088. Where should I keep my medicine? This drug is given in a hospital or clinic and will not be stored at home. NOTE: This sheet is a summary. It may not cover all possible information. If you have questions about this medicine, talk to your doctor, pharmacist, or health care provider.  2012, [employer]/Gold Standard. (03/07/2008 2:38:05 PM)Paclitaxel injection What is this medicine? PACLITAXEL (PAK li TAX el) is a chemotherapy drug. It targets fast dividing cells, like cancer cells, and causes these cells to die. This medicine is used to treat ovarian cancer, breast cancer, and other  cancers. This medicine may be used for other purposes; ask your health care provider or pharmacist if you have questions. What should I tell my health care provider before I take this medicine? They need to know if you have any of these conditions: -blood disorders -irregular heartbeat -infection (especially a virus infection such as chickenpox, cold sores, or herpes) -liver disease -previous or ongoing radiation therapy -an unusual or allergic reaction to paclitaxel, alcohol, polyoxyethylated castor oil, other chemotherapy agents, other medicines, foods, dyes, or preservatives -pregnant or trying to get pregnant -breast-feeding How should I use this medicine? This drug is given as an infusion into a vein. It is administered in a hospital or clinic by a specially trained health care professional. Talk to your pediatrician regarding the use of this medicine in children. Special care may be needed. Overdosage: If you think you have taken too much of this medicine contact a poison control center or emergency room at once. NOTE: This medicine is only for you. Do not share this medicine with others. What if I miss a dose? It is important not to miss your dose. Call your doctor or health care professional if you are unable to keep an appointment. What may interact with this medicine? Do not take this medicine with any of the following medications: -disulfiram -metronidazole This medicine may also interact with the following medications: -cyclosporine -dexamethasone -diazepam -ketoconazole -medicines to increase blood counts like filgrastim, pegfilgrastim, sargramostim -other chemotherapy drugs like cisplatin, doxorubicin, epirubicin, etoposide, teniposide, vincristine -quinidine -testosterone -vaccines -  verapamil Talk to your doctor or health care professional before taking any of these medicines: -acetaminophen -aspirin -ibuprofen -ketoprofen -naproxen This list may not describe  all possible interactions. Give your health care provider a list of all the medicines, herbs, non-prescription drugs, or dietary supplements you use. Also tell them if you smoke, drink alcohol, or use illegal drugs. Some items may interact with your medicine. What should I watch for while using this medicine? Your condition will be monitored carefully while you are receiving this medicine. You will need important blood work done while you are taking this medicine. This drug may make you feel generally unwell. This is not uncommon, as chemotherapy can affect healthy cells as well as cancer cells. Report any side effects. Continue your course of treatment even though you feel ill unless your doctor tells you to stop. In some cases, you may be given additional medicines to help with side effects. Follow all directions for their use. Call your doctor or health care professional for advice if you get a fever, chills or sore throat, or other symptoms of a cold or flu. Do not treat yourself. This drug decreases your body's ability to fight infections. Try to avoid being around people who are sick. This medicine may increase your risk to bruise or bleed. Call your doctor or health care professional if you notice any unusual bleeding. Be careful brushing and flossing your teeth or using a toothpick because you may get an infection or bleed more easily. If you have any dental work done, tell your dentist you are receiving this medicine. Avoid taking products that contain aspirin, acetaminophen, ibuprofen, naproxen, or ketoprofen unless instructed by your doctor. These medicines may hide a fever. Do not become pregnant while taking this medicine. Women should inform their doctor if they wish to become pregnant or think they might be pregnant. There is a potential for serious side effects to an unborn child. Talk to your health care professional or pharmacist for more information. Do not breast-feed an infant while  taking this medicine. Men are advised not to father a child while receiving this medicine. What side effects may I notice from receiving this medicine? Side effects that you should report to your doctor or health care professional as soon as possible: -allergic reactions like skin rash, itching or hives, swelling of the face, lips, or tongue -low blood counts - This drug may decrease the number of white blood cells, red blood cells and platelets. You may be at increased risk for infections and bleeding. -signs of infection - fever or chills, cough, sore throat, pain or difficulty passing urine -signs of decreased platelets or bleeding - bruising, pinpoint red spots on the skin, black, tarry stools, nosebleeds -signs of decreased red blood cells - unusually weak or tired, fainting spells, lightheadedness -breathing problems -chest pain -high or low blood pressure -mouth sores -nausea and vomiting -pain, swelling, redness or irritation at the injection site -pain, tingling, numbness in the hands or feet -slow or irregular heartbeat -swelling of the ankle, feet, hands Side effects that usually do not require medical attention (report to your doctor or health care professional if they continue or are bothersome): -bone pain -complete hair loss including hair on your head, underarms, pubic hair, eyebrows, and eyelashes -changes in the color of fingernails -diarrhea -loosening of the fingernails -loss of appetite -muscle or joint pain -red flush to skin -sweating This list may not describe all possible side effects. Call your doctor for medical advice   about side effects. You may report side effects to FDA at 1-800-FDA-1088. Where should I keep my medicine? This drug is given in a hospital or clinic and will not be stored at home. NOTE: This sheet is a summary. It may not cover all possible information. If you have questions about this medicine, talk to your doctor, pharmacist, or health care  provider.  2013, [employer]/Gold Standard. (11/13/2008 11:54:26 AM)  

## 2013-05-02 NOTE — Progress Notes (Signed)
Patient is a 57 year old male diagnosed with lung cancer receiving chemotherapy and radiation therapy.  Past medical history includes diabetes, mediastinal mass, and COPD.  Medications include Decadron, Amaryl, Ativan, multivitamin, Zofran, and Compazine.  Labs include sodium 135, potassium 3.4, and glucose 240.  Height: 6 feet 3 inches. Weight: 193.7 pounds. Usual body weight: 230 pounds per patient. Patient weight 210 pounds on 03/18/2013. BMI: 24.21.  Patient reports a history of nausea however, this is improved on Compazine. He drinks 2 ensure daily. He reports frequent "pudding-like" stools approximately 24 daily. He denies diarrhea. Patient states he has not mentioned this to his physician. He denies difficulty eating at this time.  Nutrition diagnosis: Unintended weight loss related to diagnosis of lung cancer and associated treatments as evidenced by 16% weight loss from usual body weight.  Intervention: I educated patient on the importance of small, frequent meals with adequate calories and high protein to promote weight maintenance. I have educated him on how to eat to minimize nutrition impact symptoms including nausea and diarrhea. He was encouraged to consume cool foods to improve nausea. I educated patient to increase ensure to Ensure Plus 3 times a day between meals. I provided fact sheets and contact information for patient. Teach back method used.  Monitoring, evaluation, goals: Patient will tolerate adequate calories and protein to minimize further weight loss.  Next visit: Monday, June 2, during chemotherapy.

## 2013-05-02 NOTE — Progress Notes (Signed)
  Subjective:    Patient ID: Rodney Powers, male    DOB: Dec 19, 1955, 57 y.o.   MRN: [id number]  HPI .  57 yo heavy smoker admitted 3-23/14 with abd pain, N/V. Ct abd with  Ct findings suspicious for metastatic disease, asdz lul ?post obstructive pna,rt paratracheal lymph node. T2N3 , EBUS neg, mediastinoscopy confirmed squamous cell CA Hyponatremia - resolved  Acute renal failure- dehydration , resolved  Ileus - CT abdomen/ pelvis showed long segment narrowing of the terminal ileum.  Liver lesions- favor hemangiomas  ETOH abuse - counselled     05/02/2013  Started concurrent chemoradiation on 04/18/13 -weekly Carboplatin/Taxol. Unclear if he saw ENT for pharyngeal lesion on PET (? Primary)  Pt states he feels fine just feels tired most of the time. Pt states his breathing has been doing "pretty good". Has occasional dry cough. denies any wheezing, no chest tx. Pt started the patch today to help stop smoking - down ot 2-3 cigs/d C/o polyuria - CBGs high 250 range on certain days, some diarrhea Takes dexa post chemo - received today Has lost 20-30 lbs Review of Systems neg for any significant sore throat, dysphagia, itching, sneezing, nasal congestion or excess/ purulent secretions, fever, chills, sweats, unintended wt loss, pleuritic or exertional cp, hempoptysis, orthopnea pnd or change in chronic leg swelling. Also denies presyncope, palpitations, heartburn, abdominal pain, nausea, vomiting, diarrhea or change in bowel or urinary habits, dysuria,hematuria, rash, arthralgias, visual complaints, headache, numbness weakness or ataxia.     Objective:   Physical Exam  Gen. Pleasant, mal-nourished, in no distress ENT - no lesions, no post nasal drip Neck: No JVD, no thyromegaly, no carotid bruits Lungs: no use of accessory muscles, no dullness to percussion, clear without rales or rhonchi  Cardiovascular: Rhythm regular, heart sounds  normal, no murmurs or gallops, no peripheral  edema Musculoskeletal: No deformities, no cyanosis or clubbing        Assessment & Plan:

## 2013-05-02 NOTE — Patient Instructions (Addendum)
Dexamethasone (steroid) will raise your sugars Increase diabetes pill (glimeperide ) to twice daily on the 3 days that you take dexa Keep trying to quit cigarettes with nicotine patch

## 2013-05-02 NOTE — Assessment & Plan Note (Signed)
Rpt imaging planned after chemoRT

## 2013-05-02 NOTE — Assessment & Plan Note (Signed)
Dexamethasone (steroid) will raise your sugars Increase diabetes pill (glimeperide ) to twice daily on the 3 days that you take dexa Defer to PCP to manage

## 2013-05-02 NOTE — Assessment & Plan Note (Signed)
Keep trying to quit cigarettes with nicotine patch -will not add to his med burden at this point

## 2013-05-03 ENCOUNTER — Encounter: Payer: Self-pay | Admitting: Radiation Oncology

## 2013-05-03 ENCOUNTER — Ambulatory Visit
Admission: RE | Admit: 2013-05-03 | Discharge: 2013-05-03 | Disposition: A | Discharge status: Home / Self Care | Payer: Medicaid Other | Source: Ambulatory Visit | Attending: Radiation Oncology | Admitting: Radiation Oncology

## 2013-05-03 ENCOUNTER — Other Ambulatory Visit: Payer: Self-pay | Admitting: Certified Registered Nurse Anesthetist

## 2013-05-03 MED ORDER — SUCRALFATE 1 GM/10ML PO SUSP: 1.0000 g | Freq: Four times a day (QID) | ORAL | Status: DC

## 2013-05-03 NOTE — Progress Notes (Signed)
Pt denies pain, loss of appetite, cough, has SOB w/exertion, fatigue.

## 2013-05-03 NOTE — Progress Notes (Signed)
[facility]    Radiation Oncology [address] [place]     Rodney Powers, M.D. [place], [place] [address]               Rodney Powers, M.D., Ph.D. Phone: [phone]      Rodney Powers, M.D. Fax: [phone]      Rodney Powers, M.D., Ph.D.         Rodney Powers, M.D.         Rodney Powers, M.D Weekly Treatment Management Note  Name: Rodney Powers     MRN: [id number]        CSN: [id number] Date: 05/03/2013      DOB: [birth year]  CC: Default, Provider, MD         Rodney Powers    Status: Outpatient  Diagnosis: The encounter diagnosis was Lung cancer, left.  Current Dose: 19.8 Gy  Current Fraction: 11  Planned Dose: 63 Gy  Narrative: Rodney Powers was seen today for weekly treatment management. The chart was checked and CBCT  were reviewed. He is tolerating his treatments well at this time. He has noticed some mild fatigue. He denies any difficulty with swallowing or breathing problems. Patient was started on nicotine patch yesterday.  I have given him a prescription for Carafate suspension to use 4 times daily  Review of patient's allergies indicates no known allergies.  Current Outpatient Prescriptions  Medication Sig Dispense Refill  . dexamethasone (DECADRON) 4 MG tablet Take 1 tablet (4 mg total) by mouth 2 (two) times daily with a meal. Take 8 mg 2 times daily with meals starting the day after chemotherapy for 3 days.  30 tablet  1  . glucose monitoring kit (FREESTYLE) monitoring kit 1 each by Does not apply route as needed for other.  1 each  0  . hyaluronate sodium (RADIAPLEXRX) GEL Apply topically 2 (two) times daily.      Marland Kitchen HYDROcodone-acetaminophen (NORCO) 5-325 MG per tablet Take 1 tablet by mouth every 6 (six) hours as needed for pain.  90 tablet  0  . LORazepam (ATIVAN) 0.5 MG tablet Take 1 tablet (0.5 mg total) by mouth every 6 (six) hours as needed. Take as needed for nausea and vomiting.  30 tablet  1  . Multiple Vitamin (MULTIVITAMIN  WITH MINERALS) TABS Take 1 tablet by mouth daily.  30 tablet  3  . nicotine (NICODERM CQ) 14 mg/24hr patch Place 1 patch onto the skin daily.  28 patch  1  . nicotine (NICODERM CQ) 7 mg/24hr patch Place 1 patch onto the skin daily.  28 patch  0  . ondansetron (ZOFRAN) 8 MG tablet Take 1 tablet (8 mg total) by mouth every 12 (twelve) hours as needed for nausea. Take 1 tablet 2 times daily starting the day after chemotherapy.  Then take 1 tablet two times a day as needed for nausea and vomiting.  30 tablet  1  . paclitaxel (TAXOL) 30 MG/5ML injection Inject as directed once a week.      . prochlorperazine (COMPAZINE) 10 MG tablet Take 1 tablet (10 mg total) by mouth every 6 (six) hours as needed. Take as needed for nausea and vomiting.  30 tablet  1  . prochlorperazine (COMPAZINE) 25 MG suppository Place 1 suppository (25 mg total) rectally every 12 (twelve) hours as needed for nausea.  12 suppository  3  . tamsulosin (FLOMAX) 0.4 MG CAPS Take 1 capsule (0.4 mg total) by mouth daily after  supper.  30 capsule  3  . glimepiride (AMARYL) 1 MG tablet Take 1 tablet (1 mg total) by mouth daily before breakfast.  30 tablet  3  . sucralfate (CARAFATE) 1 GM/10ML suspension Take 10 mLs (1 g total) by mouth 4 (four) times daily.  420 mL  0   No current facility-administered medications for this encounter.   Labs:  Lab Results  Component Value Date   WBC 6.8 04/29/2013   HGB 9.5* 04/29/2013   HCT 30.2* 04/29/2013   MCV 72.2* 04/29/2013   PLT 383 04/29/2013   Lab Results  Component Value Date   CREATININE 0.9 04/29/2013   BUN 16.1 04/29/2013   NA 137 04/29/2013   K 3.7 04/29/2013   CL 103 04/29/2013   CO2 24 04/29/2013   Lab Results  Component Value Date   ALT 85* 04/29/2013   AST 16 04/29/2013   BILITOT 0.60 04/29/2013    Physical Examination:  weight is 204 lb (92.534 kg). His oral temperature is 97.3 F (36.3 C). His blood pressure is 131/67 and his pulse is 72. His respiration is 20 and oxygen  saturation is 100%.    Wt Readings from Last 3 Encounters:  05/03/13 204 lb (92.534 kg)  05/02/13 206 lb (93.441 kg)  04/29/13 197 lb (89.359 kg)    The oral cavity is moist without secondary infection. Lungs - Normal respiratory effort, chest expands symmetrically. Lungs are clear to auscultation, no crackles or wheezes.  Heart has regular rhythm and rate  Abdomen is soft and non tender with normal bowel sounds  Assessment:  Patient tolerating treatments well  Plan: Continue treatment per original radiation prescription

## 2013-05-04 ENCOUNTER — Ambulatory Visit
Admission: RE | Admit: 2013-05-04 | Discharge: 2013-05-04 | Disposition: A | Discharge status: Home / Self Care | Payer: Medicaid Other | Source: Ambulatory Visit | Attending: Radiation Oncology | Admitting: Radiation Oncology

## 2013-05-05 ENCOUNTER — Ambulatory Visit
Admission: RE | Admit: 2013-05-05 | Discharge: 2013-05-05 | Disposition: A | Discharge status: Home / Self Care | Payer: Medicaid Other | Source: Ambulatory Visit | Attending: Radiation Oncology | Admitting: Radiation Oncology

## 2013-05-06 ENCOUNTER — Encounter: Payer: Self-pay | Admitting: Physician Assistant

## 2013-05-06 ENCOUNTER — Ambulatory Visit
Admission: RE | Admit: 2013-05-06 | Discharge: 2013-05-06 | Disposition: A | Discharge status: Home / Self Care | Payer: Medicaid Other | Source: Ambulatory Visit | Attending: Radiation Oncology | Admitting: Radiation Oncology

## 2013-05-06 ENCOUNTER — Ambulatory Visit (HOSPITAL_BASED_OUTPATIENT_CLINIC_OR_DEPARTMENT_OTHER): Payer: Medicaid Other | Admitting: Physician Assistant

## 2013-05-06 ENCOUNTER — Other Ambulatory Visit: Payer: Self-pay | Admitting: Physician Assistant

## 2013-05-06 ENCOUNTER — Encounter: Payer: Medicaid Other | Admitting: Oncology

## 2013-05-06 ENCOUNTER — Other Ambulatory Visit: Payer: Self-pay | Admitting: *Deleted

## 2013-05-06 ENCOUNTER — Other Ambulatory Visit (HOSPITAL_BASED_OUTPATIENT_CLINIC_OR_DEPARTMENT_OTHER): Payer: Medicaid Other | Admitting: Lab

## 2013-05-06 DIAGNOSIS — C3490 Malignant neoplasm of unspecified part of unspecified bronchus or lung: Secondary | ICD-10-CM

## 2013-05-06 LAB — CBC WITH DIFFERENTIAL/PLATELET
BASO%: 0.6 % (ref 0.0–2.0)
EOS%: 0.2 % (ref 0.0–7.0)
Eosinophils Absolute: 0 10*3/uL (ref 0.0–0.5)
LYMPH%: 5.7 % — ABNORMAL LOW (ref 14.0–49.0)
MCHC: 33.7 g/dL (ref 32.0–36.0)
MCV: 73.8 fL — ABNORMAL LOW (ref 79.3–98.0)
MONO%: 8.8 % (ref 0.0–14.0)
NEUT#: 7.1 10*3/uL — ABNORMAL HIGH (ref 1.5–6.5)
Platelets: 347 10*3/uL (ref 140–400)
RBC: 4.18 10*6/uL — ABNORMAL LOW (ref 4.20–5.82)
RDW: 21.6 % — ABNORMAL HIGH (ref 11.0–14.6)

## 2013-05-06 LAB — COMPREHENSIVE METABOLIC PANEL (CC13)
ALT: 20 U/L (ref 0–55)
AST: 7 U/L (ref 5–34)
Albumin: 2.7 g/dL — ABNORMAL LOW (ref 3.5–5.0)
Alkaline Phosphatase: 109 U/L (ref 40–150)
Glucose: 226 mg/dl — ABNORMAL HIGH (ref 70–99)
Potassium: 3.8 mEq/L (ref 3.5–5.1)
Sodium: 132 mEq/L — ABNORMAL LOW (ref 136–145)
Total Bilirubin: 0.28 mg/dL (ref 0.20–1.20)
Total Protein: 6.5 g/dL (ref 6.4–8.3)

## 2013-05-06 NOTE — Patient Instructions (Addendum)
Follow up with Dr. Gaylyn Rong as scheduled on 05/16/13

## 2013-05-06 NOTE — Progress Notes (Signed)
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   PAST THERAPY:  Biopsy only.   CURRENT THERAPY:  Started concurrent chemoradiation on 04/18/13. Chemotherapy consists of weekly Carboplatin/Taxol.  INTERVAL HISTORY: Rodney Powers 57 y.o. male returns for regular follow up.  He is proceeding with this course of concurrent chemoradiation with weekly carboplatin and Taxol and is tolerating this treatment relatively well thus far. He has only mild fatigue at denied any issues with nausea, vomiting, diarrhea or constipation. He denied pain and fevers. He states that Dr. Mauricio Po was going to empirically place him on Carafate however when he checked this prescription was not at his pharmacy. He voiced no other specific complaints today.     Past Medical History  Diagnosis Date  . Back pain, chronic   . Diabetes mellitus, type II   . Mediastinal mass   . Anemia   . Numbness and tingling in hands   . Numbness and tingling of foot     bilateral  . Urinary retention   . COPD (chronic obstructive pulmonary disease)   . Lung cancer 04/07/2013    Past Surgical History  Procedure Laterality Date  . Video bronchoscopy with endobronchial ultrasound Bilateral 03/10/2013    Procedure: VIDEO BRONCHOSCOPY WITH ENDOBRONCHIAL ULTRASOUND;  Surgeon: Oretha Milch, MD;  Location: [facility] OR;  Service: Pulmonary;  Laterality: Bilateral;  . Mediastinotomy chamberlain mcneil Left 03/24/2013    Procedure: MEDIASTINOTOMY CHAMBERLAIN MCNEIL;  Surgeon: Loreli Slot, MD;  Location: [facility] OR;  Service: Thoracic;  Laterality: Left;  . Excisional hemorrhoidectomy      > 30 yrs ago    Current Outpatient Prescriptions  Medication Sig Dispense Refill  . dexamethasone (DECADRON) 4 MG tablet Take 1 tablet (4 mg total) by mouth 2 (two) times daily  with a meal. Take 8 mg 2 times daily with meals starting the day after chemotherapy for 3 days.  30 tablet  1  . glimepiride (AMARYL) 1 MG tablet Take 1 tablet (1 mg total) by mouth daily before breakfast.  30 tablet  3  . glucose monitoring kit (FREESTYLE) monitoring kit 1 each by Does not apply route as needed for other.  1 each  0  . hyaluronate sodium (RADIAPLEXRX) GEL Apply topically 2 (two) times daily.      Marland Kitchen HYDROcodone-acetaminophen (NORCO) 5-325 MG per tablet Take 1 tablet by mouth every 6 (six) hours as needed for pain.  90 tablet  0  . LORazepam (ATIVAN) 0.5 MG tablet Take 1 tablet (0.5 mg total) by mouth every 6 (six) hours as needed. Take as needed for nausea and vomiting.  30 tablet  1  . Multiple Vitamin (MULTIVITAMIN WITH MINERALS) TABS Take 1 tablet by mouth daily.  30 tablet  3  . nicotine (NICODERM CQ) 14 mg/24hr patch Place 1 patch onto the skin daily.  28 patch  1  . nicotine (NICODERM CQ) 7 mg/24hr patch Place 1 patch onto the skin daily.  28 patch  0  . ondansetron (ZOFRAN) 8 MG tablet Take 1 tablet (8 mg total) by mouth every 12 (twelve) hours as needed for nausea. Take 1 tablet 2 times daily starting the day after chemotherapy.  Then take 1 tablet two times a day as needed for nausea and vomiting.  30 tablet  1  . paclitaxel (TAXOL)  30 MG/5ML injection Inject as directed once a week.      . prochlorperazine (COMPAZINE) 10 MG tablet Take 1 tablet (10 mg total) by mouth every 6 (six) hours as needed. Take as needed for nausea and vomiting.  30 tablet  1  . prochlorperazine (COMPAZINE) 25 MG suppository Place 1 suppository (25 mg total) rectally every 12 (twelve) hours as needed for nausea.  12 suppository  3  . sucralfate (CARAFATE) 1 GM/10ML suspension Take 10 mLs (1 g total) by mouth 4 (four) times daily.  420 mL  0  . tamsulosin (FLOMAX) 0.4 MG CAPS Take 1 capsule (0.4 mg total) by mouth daily after supper.  30 capsule  3   No current facility-administered medications for  this visit.    ALLERGIES:  has No Known Allergies.  REVIEW OF SYSTEMS:  The rest of the 14-point review of system was negative.   There were no vitals filed for this visit. Wt Readings from Last 3 Encounters:  05/06/13 204 lb (92.534 kg)  05/03/13 204 lb (92.534 kg)  05/02/13 206 lb (93.441 kg)   ECOG Performance status: ECOG 1.   PHYSICAL EXAMINATION:  General: well-nourished man, in no acute distress. Eyes: no scleral icterus. ENT: There were no oropharyngeal lesions. Neck was without thyromegaly. Lymphatics: Negative cervical, supraclavicular or axillary adenopathy. Respiratory: lungs were clear bilaterally without wheezing rales or rhonchi. Cardiovascular: Regular rate and rhythm, S1/S2, without murmur, rub or gallop. There was no pedal edema. GI: abdomen was soft, flat, nontender, nondistended, without organomegaly. Muscoloskeletal: no spinal tenderness of palpation of vertebral spine. Skin exam was without echymosis, petichae. Neuro exam was nonfocal. Patient was able to get on and off exam table without assistance. Gait was normal. Patient was alerted and oriented. Attention was good. Language was appropriate. Mood was normal without depression. Speech was not pressured. Thought content was not tangential.   LABORATORY/RADIOLOGY DATA:  Lab Results  Component Value Date   WBC 8.3 05/06/2013   HGB 10.4* 05/06/2013   HCT 30.9* 05/06/2013   PLT 347 05/06/2013   GLUCOSE 226* 05/06/2013   ALKPHOS 109 05/06/2013   ALT 20 05/06/2013   AST 7 05/06/2013   NA 132* 05/06/2013   K 3.8 05/06/2013   CL 100 05/06/2013   CREATININE 0.8 05/06/2013   BUN 13.4 05/06/2013   CO2 23 05/06/2013   INR 1.01 03/23/2013   HGBA1C 7.6* 03/06/2013     ASSESSMENT AND PLAN:   1.  Diagnosis:  Squamous cell carcinoma of the lung.  Need to rule out cancer from head and neck as well. 2.  Work up:  Has been seen by Dr. Pollyann Kennedy. His note indicates that this lesion would be amenable to biopsy via endoscopically and if  positive for malignancy would be amenable to radiation treatment.  At this time the biopsy will be postponed until the patient has completed his current treatment of concurrent chemoradiation for his non-small cell lung cancer, squamous cell carcinoma. 3.  Treatment for lung cancer:  Concurrent chemoradiation.  *  Chemo:  Carboplatin/Taxol once a week for the duration of radiation for about 5-6 weeks.  *  S/P 3 doses of Carbo/Taxol. He has grade 1 fatigue. Recommend that he proceed with Carbo/Taxol without dose modification on 05/10/13. 4.  To assess response:  Repeat CT scan after finish chemoradiation.   5.   smoking cessation: utilizing Nicoderm patches.  6.  Follow up: as previously scheduled with Dr. Gaylyn Rong 05/16/2013   The length of time  of the face-to-face encounter was 15 minutes. More than 50% of time was spent counseling and coordination of care.

## 2013-05-07 NOTE — Progress Notes (Signed)
This encounter was created in error - please disregard.

## 2013-05-10 ENCOUNTER — Ambulatory Visit (HOSPITAL_BASED_OUTPATIENT_CLINIC_OR_DEPARTMENT_OTHER): Payer: Medicaid Other

## 2013-05-10 ENCOUNTER — Encounter: Payer: Self-pay | Admitting: Radiation Oncology

## 2013-05-10 ENCOUNTER — Ambulatory Visit
Admission: RE | Admit: 2013-05-10 | Discharge: 2013-05-10 | Disposition: A | Discharge status: Home / Self Care | Payer: Medicaid Other | Source: Ambulatory Visit | Attending: Radiation Oncology | Admitting: Radiation Oncology

## 2013-05-10 DIAGNOSIS — Z5111 Encounter for antineoplastic chemotherapy: Secondary | ICD-10-CM

## 2013-05-10 DIAGNOSIS — C3400 Malignant neoplasm of unspecified main bronchus: Secondary | ICD-10-CM

## 2013-05-10 MED ORDER — FAMOTIDINE IN NACL 20-0.9 MG/50ML-% IV SOLN
20.0000 mg | Freq: Once | INTRAVENOUS | Status: AC
  Administered 2013-05-10: 20 mg via INTRAVENOUS

## 2013-05-10 MED ORDER — DIPHENHYDRAMINE HCL 50 MG/ML IJ SOLN
50.0000 mg | Freq: Once | INTRAMUSCULAR | Status: AC
  Administered 2013-05-10: 50 mg via INTRAVENOUS

## 2013-05-10 MED ORDER — SODIUM CHLORIDE 0.9 % IV SOLN
290.0000 mg | Freq: Once | INTRAVENOUS | Status: AC
  Administered 2013-05-10: 290 mg via INTRAVENOUS
  Filled 2013-05-10: qty 29

## 2013-05-10 MED ORDER — DEXAMETHASONE SODIUM PHOSPHATE 20 MG/5ML IJ SOLN
20.0000 mg | Freq: Once | INTRAMUSCULAR | Status: AC
  Administered 2013-05-10: 20 mg via INTRAVENOUS

## 2013-05-10 MED ORDER — SODIUM CHLORIDE 0.9 % IV SOLN
45.0000 mg/m2 | Freq: Once | INTRAVENOUS | Status: AC
  Administered 2013-05-10: 102 mg via INTRAVENOUS
  Filled 2013-05-10: qty 17

## 2013-05-10 MED ORDER — SODIUM CHLORIDE 0.9 % IV SOLN
Freq: Once | INTRAVENOUS | Status: AC
  Administered 2013-05-10: 10:00:00 via INTRAVENOUS

## 2013-05-10 MED ORDER — ONDANSETRON 16 MG/50ML IVPB (CHCC)
16.0000 mg | Freq: Once | INTRAVENOUS | Status: AC
  Administered 2013-05-10: 16 mg via INTRAVENOUS

## 2013-05-10 NOTE — Progress Notes (Signed)
Use CBC results from 5/23 for treatment today, per Dr. Gaylyn Rong.

## 2013-05-10 NOTE — Progress Notes (Signed)
  Radiation Oncology         [phone] ________________________________  Name: Rodney Powers MRN: [id number]  Date: 05/10/2013  DOB: Aug 24, 1956  Weekly Radiation Therapy Management  Current Dose: 27 Gy     Planned Dose:  63 Gy  Narrative . . . . . . . . The patient presents for routine under treatment assessment.                                                     The patient is without complaint.  He denies any swallowing difficulties.  He has had some bowel frequency and gas but no diarrhea or rectal bleeding.                                 Set-up films were reviewed.                                 The chart was checked. Physical Findings. . .  weight is 206 lb 6.4 oz (93.622 kg). His oral temperature is 98.1 F (36.7 C). His blood pressure is 134/61 and his pulse is 103. His respiration is 20 and oxygen saturation is 100%. . Weight essentially stable.  No significant changes. Impression . . . . . . . The patient is  tolerating radiation. Plan . . . . . . . . . . . . Continue treatment as planned.  He is to pick up his Carafate suspension today  ________________________________  -----------------------------------  Billie Lade, PhD, MD

## 2013-05-10 NOTE — Patient Instructions (Addendum)
[facility] Discharge Instructions for Patients Receiving Chemotherapy  Today you received the following chemotherapy agents: Taxol/Carboplatin  To help prevent nausea and vomiting after your treatment, we encourage you to take your nausea medication:Ativan, Zofran, Compazine as often as prescribed.   If you develop nausea and vomiting that is not controlled by your nausea medication, call the clinic. If it is after clinic hours your family physician or the after hours number for the clinic or go to the Emergency Department.   BELOW ARE SYMPTOMS THAT SHOULD BE REPORTED IMMEDIATELY:  *FEVER GREATER THAN 100.5 F  *CHILLS WITH OR WITHOUT FEVER  NAUSEA AND VOMITING THAT IS NOT CONTROLLED WITH YOUR NAUSEA MEDICATION  *UNUSUAL SHORTNESS OF BREATH  *UNUSUAL BRUISING OR BLEEDING  TENDERNESS IN MOUTH AND THROAT WITH OR WITHOUT PRESENCE OF ULCERS  *URINARY PROBLEMS  *BOWEL PROBLEMS  UNUSUAL RASH Items with * indicate a potential emergency and should be followed up as soon as possible.  . The clinic phone number is [phone].

## 2013-05-10 NOTE — Progress Notes (Signed)
Pt denies pain, SOB, cough, sore throat,  fatigue, loss of appetite. Pt is c/o bowel urgency but states when "he sits down he may have a little stool and lots of gas". He denies constipation or diarrhea. Pt's only new med is Carafate which he has not begun to take.

## 2013-05-11 ENCOUNTER — Telehealth: Payer: Self-pay | Admitting: *Deleted

## 2013-05-11 ENCOUNTER — Ambulatory Visit
Admission: RE | Admit: 2013-05-11 | Discharge: 2013-05-11 | Disposition: A | Discharge status: Home / Self Care | Payer: Medicaid Other | Source: Ambulatory Visit | Attending: Radiation Oncology | Admitting: Radiation Oncology

## 2013-05-11 NOTE — Telephone Encounter (Signed)
Returned vm from pt who is requesting his Sucralfate script that was e-scribed to [facility] pharmacy be sent to [facility]. He also requests that all future scripts be sent only to [facility]. This change was made in Epic under pt's demographics. Called [facility] and requested they call [facility] to transfer this script. Pt notified that script should be available at [facility] pharmacy later today; pt verbalized understanding.

## 2013-05-12 ENCOUNTER — Ambulatory Visit
Admission: RE | Admit: 2013-05-12 | Discharge: 2013-05-12 | Disposition: A | Discharge status: Home / Self Care | Payer: Medicaid Other | Source: Ambulatory Visit | Attending: Radiation Oncology | Admitting: Radiation Oncology

## 2013-05-13 ENCOUNTER — Ambulatory Visit
Admission: RE | Admit: 2013-05-13 | Discharge: 2013-05-13 | Disposition: A | Discharge status: Home / Self Care | Payer: Medicaid Other | Source: Ambulatory Visit | Attending: Radiation Oncology | Admitting: Radiation Oncology

## 2013-05-13 ENCOUNTER — Telehealth: Payer: Self-pay | Admitting: Dietician

## 2013-05-16 ENCOUNTER — Other Ambulatory Visit (HOSPITAL_BASED_OUTPATIENT_CLINIC_OR_DEPARTMENT_OTHER): Payer: Medicaid Other

## 2013-05-16 ENCOUNTER — Ambulatory Visit (HOSPITAL_BASED_OUTPATIENT_CLINIC_OR_DEPARTMENT_OTHER): Payer: Medicaid Other | Admitting: Oncology

## 2013-05-16 ENCOUNTER — Ambulatory Visit: Payer: Medicaid Other | Admitting: Nutrition

## 2013-05-16 ENCOUNTER — Telehealth: Payer: Self-pay | Admitting: *Deleted

## 2013-05-16 ENCOUNTER — Ambulatory Visit (HOSPITAL_BASED_OUTPATIENT_CLINIC_OR_DEPARTMENT_OTHER): Payer: Medicaid Other

## 2013-05-16 ENCOUNTER — Telehealth: Payer: Self-pay | Admitting: Oncology

## 2013-05-16 ENCOUNTER — Ambulatory Visit
Admission: RE | Admit: 2013-05-16 | Discharge: 2013-05-16 | Disposition: A | Discharge status: Home / Self Care | Payer: Medicaid Other | Source: Ambulatory Visit | Attending: Radiation Oncology | Admitting: Radiation Oncology

## 2013-05-16 DIAGNOSIS — D72819 Decreased white blood cell count, unspecified: Secondary | ICD-10-CM

## 2013-05-16 DIAGNOSIS — C3400 Malignant neoplasm of unspecified main bronchus: Secondary | ICD-10-CM

## 2013-05-16 DIAGNOSIS — D649 Anemia, unspecified: Secondary | ICD-10-CM

## 2013-05-16 DIAGNOSIS — Z5111 Encounter for antineoplastic chemotherapy: Secondary | ICD-10-CM

## 2013-05-16 DIAGNOSIS — C3490 Malignant neoplasm of unspecified part of unspecified bronchus or lung: Secondary | ICD-10-CM

## 2013-05-16 LAB — CBC WITH DIFFERENTIAL/PLATELET
BASO%: 0.3 % (ref 0.0–2.0)
EOS%: 1.1 % (ref 0.0–7.0)
HCT: 33.6 % — ABNORMAL LOW (ref 38.4–49.9)
MCH: 23.6 pg — ABNORMAL LOW (ref 27.2–33.4)
MCHC: 31 g/dL — ABNORMAL LOW (ref 32.0–36.0)
MONO#: 0.3 10*3/uL (ref 0.1–0.9)
NEUT%: 77.7 % — ABNORMAL HIGH (ref 39.0–75.0)
RDW: 25 % — ABNORMAL HIGH (ref 11.0–14.6)
WBC: 3.7 10*3/uL — ABNORMAL LOW (ref 4.0–10.3)
lymph#: 0.5 10*3/uL — ABNORMAL LOW (ref 0.9–3.3)
nRBC: 0 % (ref 0–0)

## 2013-05-16 LAB — COMPREHENSIVE METABOLIC PANEL (CC13)
ALT: 18 U/L (ref 0–55)
AST: 11 U/L (ref 5–34)
Albumin: 2.8 g/dL — ABNORMAL LOW (ref 3.5–5.0)
Alkaline Phosphatase: 72 U/L (ref 40–150)
BUN: 17.6 mg/dL (ref 7.0–26.0)
Calcium: 9.1 mg/dL (ref 8.4–10.4)
Chloride: 105 mEq/L (ref 98–107)
Potassium: 3.9 mEq/L (ref 3.5–5.1)
Sodium: 136 mEq/L (ref 136–145)
Total Protein: 6.3 g/dL — ABNORMAL LOW (ref 6.4–8.3)

## 2013-05-16 MED ORDER — SODIUM CHLORIDE 0.9 % IV SOLN
Freq: Once | INTRAVENOUS | Status: AC
  Administered 2013-05-16: 10:00:00 via INTRAVENOUS

## 2013-05-16 MED ORDER — FAMOTIDINE IN NACL 20-0.9 MG/50ML-% IV SOLN
20.0000 mg | Freq: Once | INTRAVENOUS | Status: AC
  Administered 2013-05-16: 20 mg via INTRAVENOUS

## 2013-05-16 MED ORDER — DEXAMETHASONE SODIUM PHOSPHATE 20 MG/5ML IJ SOLN
20.0000 mg | Freq: Once | INTRAMUSCULAR | Status: AC
  Administered 2013-05-16: 20 mg via INTRAVENOUS

## 2013-05-16 MED ORDER — DIPHENHYDRAMINE HCL 50 MG/ML IJ SOLN
50.0000 mg | Freq: Once | INTRAMUSCULAR | Status: AC
  Administered 2013-05-16: 50 mg via INTRAVENOUS

## 2013-05-16 MED ORDER — SODIUM CHLORIDE 0.9 % IV SOLN
40.5000 mg/m2 | Freq: Once | INTRAVENOUS | Status: AC
  Administered 2013-05-16: 90 mg via INTRAVENOUS
  Filled 2013-05-16: qty 15

## 2013-05-16 MED ORDER — ONDANSETRON 16 MG/50ML IVPB (CHCC)
16.0000 mg | Freq: Once | INTRAVENOUS | Status: AC
  Administered 2013-05-16: 16 mg via INTRAVENOUS

## 2013-05-16 MED ORDER — SODIUM CHLORIDE 0.9 % IV SOLN
261.0000 mg | Freq: Once | INTRAVENOUS | Status: AC
  Administered 2013-05-16: 260 mg via INTRAVENOUS
  Filled 2013-05-16: qty 26

## 2013-05-16 NOTE — Progress Notes (Signed)
[facility]  Telephone:([phone]) [phone] Fax:(336) [phone]   OFFICE PROGRESS NOTE   Cc:  No PCP Per Patient  DIAGNOSIS:  Stage III nonsmall cell lung cancer the left lung; squamous cell carcinoma.   PAST THERAPY:  Biopsy only.   CURRENT THERAPY: started concurrent chemoradiation with weekly chemo Carboplatin/Taxol and daily XRT on 04/18/2013.   INTERVAL HISTORY: Rodney Powers 57 y.o. male returns for regular follow up with his wife.  He has mild fatigue with chemo.  However, he is still independent of all activities of daily living.  He is still able to drive.  Despite having nicotine patch, he still smokes a few cigarettes a day. 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  The neuropathy did not worsen with chemo.  He denied dysphagia, odynophagia, SOB, cough, hemoptysis, fever, bleeding, nausea/vomiting, myalgia/arthralgia.  The rest of the 14-point review of system was negative.    Past Medical History  Diagnosis Date  . Back pain, chronic   . Diabetes mellitus, type II   . Mediastinal mass   . Anemia   . Numbness and tingling in hands   . Numbness and tingling of foot     bilateral  . Urinary retention   . COPD (chronic obstructive pulmonary disease)   . Lung cancer 04/07/2013    Past Surgical History  Procedure Laterality Date  . Video bronchoscopy with endobronchial ultrasound Bilateral 03/10/2013    Procedure: VIDEO BRONCHOSCOPY WITH ENDOBRONCHIAL ULTRASOUND;  Surgeon: Oretha Milch, MD;  Location: [facility] OR;  Service: Pulmonary;  Laterality: Bilateral;  . Mediastinotomy chamberlain mcneil Left 03/24/2013    Procedure: MEDIASTINOTOMY CHAMBERLAIN MCNEIL;  Surgeon: Loreli Slot, MD;  Location: [facility] OR;  Service: Thoracic;  Laterality: Left;  . Excisional hemorrhoidectomy      > 30 yrs ago    Current Outpatient Prescriptions  Medication Sig Dispense Refill  . dexamethasone (DECADRON) 4 MG tablet Take 1 tablet (4 mg total) by mouth 2  (two) times daily with a meal. Take 8 mg 2 times daily with meals starting the day after chemotherapy for 3 days.  30 tablet  1  . glimepiride (AMARYL) 1 MG tablet Take 1 tablet (1 mg total) by mouth daily before breakfast.  30 tablet  3  . glucose monitoring kit (FREESTYLE) monitoring kit 1 each by Does not apply route as needed for other.  1 each  0  . hyaluronate sodium (RADIAPLEXRX) GEL Apply topically 2 (two) times daily.      Marland Kitchen HYDROcodone-acetaminophen (NORCO) 5-325 MG per tablet Take 1 tablet by mouth every 6 (six) hours as needed for pain.  90 tablet  0  . LORazepam (ATIVAN) 0.5 MG tablet Take 1 tablet (0.5 mg total) by mouth every 6 (six) hours as needed. Take as needed for nausea and vomiting.  30 tablet  1  . Multiple Vitamin (MULTIVITAMIN WITH MINERALS) TABS Take 1 tablet by mouth daily.  30 tablet  3  . nicotine (NICODERM CQ) 14 mg/24hr patch Place 1 patch onto the skin daily.  28 patch  1  . nicotine (NICODERM CQ) 7 mg/24hr patch Place 1 patch onto the skin daily.  28 patch  0  . ondansetron (ZOFRAN) 8 MG tablet Take 1 tablet (8 mg total) by mouth every 12 (twelve) hours as needed for nausea. Take 1 tablet 2 times daily starting the day after chemotherapy.  Then take 1 tablet two times a day as needed for nausea and vomiting.  30  tablet  1  . paclitaxel (TAXOL) 30 MG/5ML injection Inject as directed once a week.      . prochlorperazine (COMPAZINE) 10 MG tablet Take 1 tablet (10 mg total) by mouth every 6 (six) hours as needed. Take as needed for nausea and vomiting.  30 tablet  1  . prochlorperazine (COMPAZINE) 25 MG suppository Place 1 suppository (25 mg total) rectally every 12 (twelve) hours as needed for nausea.  12 suppository  3  . sucralfate (CARAFATE) 1 GM/10ML suspension Take 10 mLs (1 g total) by mouth 4 (four) times daily.  420 mL  0  . tamsulosin (FLOMAX) 0.4 MG CAPS Take 1 capsule (0.4 mg total) by mouth daily after supper.  30 capsule  3   No current facility-administered  medications for this visit.    ALLERGIES:  has No Known Allergies.  REVIEW OF SYSTEMS:  The rest of the 14-point review of system was negative.   Filed Vitals:   05/16/13 0924  BP: 140/78  Pulse: 90  Temp: 97.5 F (36.4 C)  Resp: 18   Wt Readings from Last 3 Encounters:  05/16/13 208 lb 8 oz (94.575 kg)  05/10/13 206 lb 6.4 oz (93.622 kg)  05/06/13 204 lb (92.534 kg)   ECOG Performance status: ECOG 1.   PHYSICAL EXAMINATION:  General: well-nourished man, in no acute distress. Eyes: no scleral icterus. ENT: There were no oropharyngeal lesions. Neck was without thyromegaly. Lymphatics: Negative cervical, supraclavicular or axillary adenopathy. Respiratory: lungs were clear bilaterally without wheezing or crackles. Cardiovascular: Regular rate and rhythm, S1/S2, without murmur, rub or gallop. There was no pedal edema. GI: abdomen was soft, flat, nontender, nondistended, without organomegaly. Muscoloskeletal: no spinal tenderness of palpation of vertebral spine. Skin exam was without echymosis, petichae. Neuro exam was nonfocal. Patient was able to get on and off exam table without assistance. Gait was normal. Patient was alerted and oriented. Attention was good. Language was appropriate. Mood was normal without depression. Speech was not pressured. Thought content was not tangential.       LABORATORY/RADIOLOGY DATA:  Lab Results  Component Value Date   WBC 3.7* 05/16/2013   HGB 10.4* 05/16/2013   HCT 33.6* 05/16/2013   PLT 180 05/16/2013   GLUCOSE 162* 05/16/2013   ALKPHOS 72 05/16/2013   ALT 18 05/16/2013   AST 11 05/16/2013   NA 136 05/16/2013   K 3.9 05/16/2013   CL 105 05/16/2013   CREATININE 0.9 05/16/2013   BUN 17.6 05/16/2013   CO2 23 05/16/2013   INR 1.01 03/23/2013   HGBA1C 7.6* 03/06/2013     ASSESSMENT AND PLAN:    1.  Diagnosis:  Squamous cell carcinoma of the lung.  2.  Treatment:  Concurrent chemo radiation with daily XRT and weekly chemo Carboplatin/Taxol.  He has grade 1 fatigue,  grade 1/2 leukopenia/anemia.   He had bilateral feet neuropathy before chemo started which has been stable for him.  I recommended to decrease chemo Taxol and Carboplatin by 10% from baseline given his cytopenia to avoid risk of infection/bleeding.    3.  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 Due to urgency to treat the much bulkier lung cancer, this has been on hold.  When he finishes chemoradiation for lung cancer, I will refer him back to Dr. Pollyann Kennedy for direct laryngoscopy and possible biopsy.   4.   Follow up: Once a week while on chemo.   The length of time of the face-to-face encounter was 15  minutes. More than 50% of time was spent counseling and coordination of care.

## 2013-05-16 NOTE — Progress Notes (Signed)
I spoke briefly with patient today in the chemotherapy room. Patient denies nutritional side effects at this time. He reports his appetite is fair and he no longer has nausea. He continues to drink several ensure daily. His diarrhea has resolved. His weight has increased to 208.5 pounds June 2 from 193.7 pounds on May 13. Patient declines offer of additional coupons for oral nutrition supplements.  Nutrition diagnosis: Unintended weight loss has improved.  Intervention: I again educated patient on the importance of continuing small frequent meals with adequate protein and calories to promote weight maintenance. He was educated to continue nausea medications as needed. I've encouraged him to contact me if he would like additional coupons. Teach back method used.  Monitoring, evaluation, goals: Patient will continue to tolerate adequate calories and protein to promote weight maintenance.  Next visit: Monday, June 9 during chemotherapy.

## 2013-05-16 NOTE — Telephone Encounter (Signed)
Per staff message and POF I have scheduled appts.  JMW  

## 2013-05-16 NOTE — Progress Notes (Signed)
Patient states his friend is driving him home today.

## 2013-05-16 NOTE — Telephone Encounter (Signed)
Per staff phone call and POF I have schedueld appts. MD appt to late on 6/16 to start three hour chemo, scheduler notified.  JMW

## 2013-05-16 NOTE — Patient Instructions (Addendum)
[facility] Discharge Instructions for Patients Receiving Chemotherapy  Today you received the following chemotherapy agents Taxol/Carboplatin  To help prevent nausea and vomiting after your treatment, we encourage you to take your nausea medication as prescribed. If you develop nausea and vomiting that is not controlled by your nausea medication, call the clinic. If it is after clinic hours your family physician or the after hours number for the clinic or go to the Emergency Department. *Do not drive as you received 50 mg Benadryl IV.  BELOW ARE SYMPTOMS THAT SHOULD BE REPORTED IMMEDIATELY:  *FEVER GREATER THAN 100.5 F  *CHILLS WITH OR WITHOUT FEVER  NAUSEA AND VOMITING THAT IS NOT CONTROLLED WITH YOUR NAUSEA MEDICATION  *UNUSUAL SHORTNESS OF BREATH  *UNUSUAL BRUISING OR BLEEDING  TENDERNESS IN MOUTH AND THROAT WITH OR WITHOUT PRESENCE OF ULCERS  *URINARY PROBLEMS  *BOWEL PROBLEMS  UNUSUAL RASH Items with * indicate a potential emergency and should be followed up as soon as possible. Feel free to call the clinic you have any questions or concerns. The clinic phone number is [phone].   I have been informed and understand all the instructions given to me. I know to contact the clinic, my physician, or go to the Emergency Department if any problems should occur. I do not have any questions at this time, but understand that I may call the clinic during office hours   should I have any questions or need assistance in obtaining follow up care.    __________________________________________  _____________  __________ Signature of Patient or Authorized Representative            Date                   Time    __________________________________________ Nurse's Signature

## 2013-05-17 ENCOUNTER — Ambulatory Visit: Payer: Medicaid Other | Attending: Family Medicine | Admitting: Family Medicine

## 2013-05-17 ENCOUNTER — Ambulatory Visit
Admission: RE | Admit: 2013-05-17 | Discharge: 2013-05-17 | Disposition: A | Discharge status: Home / Self Care | Payer: Medicaid Other | Source: Ambulatory Visit | Attending: Radiation Oncology | Admitting: Radiation Oncology

## 2013-05-17 ENCOUNTER — Encounter: Payer: Self-pay | Admitting: Radiation Oncology

## 2013-05-17 VITALS — BP 149/79 | HR 65 | Temp 98.4°F | Resp 18 | Ht 75.0 in | Wt 207.0 lb

## 2013-05-17 DIAGNOSIS — C3490 Malignant neoplasm of unspecified part of unspecified bronchus or lung: Secondary | ICD-10-CM | POA: Insufficient documentation

## 2013-05-17 DIAGNOSIS — E119 Type 2 diabetes mellitus without complications: Secondary | ICD-10-CM

## 2013-05-17 LAB — GLUCOSE, POCT (MANUAL RESULT ENTRY): POC Glucose: 272 mg/dL — AB (ref 70–99)

## 2013-05-17 MED ORDER — METFORMIN HCL 500 MG PO TABS: ORAL_TABLET | ORAL | Status: DC

## 2013-05-17 NOTE — Progress Notes (Signed)
[facility]    Radiation Oncology [address] [place]     Maryln Gottron, M.D. [place], [place] [address]               Billie Lade, M.D., Ph.D. Phone: [phone]      Molli Hazard A. Kathrynn Running, M.D. Fax: [phone]      Radene Gunning, M.D., Ph.D.         Lurline Hare, M.D.         Grayland Jack, M.D Weekly Treatment Management Note  Name: Rodney Powers     MRN: [id number]        CSN: [id number] Date: 05/17/2013      DOB: 02-Aug-1956  CC: No PCP Per Patient         Rodney Powers    Status: Outpatient  Diagnosis: The encounter diagnosis was Lung cancer, left.  Current Dose: 36 Gy  Current Fraction: 20  Planned Dose: 63 Gy  Narrative: [name] was seen today for weekly treatment management. The chart was checked and CBCT  were reviewed. He overall is tolerating his treatments well. He has mild fatigue. He denies any significant swallowing problems or shortness of breath. He denies any itching of the skin of the treatment area.  Review of patient's allergies indicates no known allergies.  Current Outpatient Prescriptions  Medication Sig Dispense Refill  . dexamethasone (DECADRON) 4 MG tablet Take 1 tablet (4 mg total) by mouth 2 (two) times daily with a meal. Take 8 mg 2 times daily with meals starting the day after chemotherapy for 3 days.  30 tablet  1  . glimepiride (AMARYL) 1 MG tablet Take 1 tablet (1 mg total) by mouth daily before breakfast.  30 tablet  3  . glucose monitoring kit (FREESTYLE) monitoring kit 1 each by Does not apply route as needed for other.  1 each  0  . hyaluronate sodium (RADIAPLEXRX) GEL Apply topically 2 (two) times daily.      Marland Kitchen HYDROcodone-acetaminophen (NORCO) 5-325 MG per tablet Take 1 tablet by mouth every 6 (six) hours as needed for pain.  90 tablet  0  . LORazepam (ATIVAN) 0.5 MG tablet Take 1 tablet (0.5 mg total) by mouth every 6 (six) hours as needed. Take as needed for nausea and vomiting.  30 tablet  1  . Multiple  Vitamin (MULTIVITAMIN WITH MINERALS) TABS Take 1 tablet by mouth daily.  30 tablet  3  . nicotine (NICODERM CQ) 14 mg/24hr patch Place 1 patch onto the skin daily.  28 patch  1  . nicotine (NICODERM CQ) 7 mg/24hr patch Place 1 patch onto the skin daily.  28 patch  0  . ondansetron (ZOFRAN) 8 MG tablet Take 1 tablet (8 mg total) by mouth every 12 (twelve) hours as needed for nausea. Take 1 tablet 2 times daily starting the day after chemotherapy.  Then take 1 tablet two times a day as needed for nausea and vomiting.  30 tablet  1  . paclitaxel (TAXOL) 30 MG/5ML injection Inject as directed once a week.      . prochlorperazine (COMPAZINE) 10 MG tablet Take 1 tablet (10 mg total) by mouth every 6 (six) hours as needed. Take as needed for nausea and vomiting.  30 tablet  1  . prochlorperazine (COMPAZINE) 25 MG suppository Place 1 suppository (25 mg total) rectally every 12 (twelve) hours as needed for nausea.  12 suppository  3  . sucralfate (CARAFATE) 1 GM/10ML suspension  Take 10 mLs (1 g total) by mouth 4 (four) times daily.  420 mL  0  . tamsulosin (FLOMAX) 0.4 MG CAPS Take 1 capsule (0.4 mg total) by mouth daily after supper.  30 capsule  3   No current facility-administered medications for this encounter.   Labs:  Lab Results  Component Value Date   WBC 3.7* 05/16/2013   HGB 10.4* 05/16/2013   HCT 33.6* 05/16/2013   MCV 76.4* 05/16/2013   PLT 180 05/16/2013   Lab Results  Component Value Date   CREATININE 0.9 05/16/2013   BUN 17.6 05/16/2013   NA 136 05/16/2013   K 3.9 05/16/2013   CL 105 05/16/2013   CO2 23 05/16/2013   Lab Results  Component Value Date   ALT 18 05/16/2013   AST 11 05/16/2013   BILITOT 0.39 05/16/2013    Physical Examination:  weight is 207 lb 11.2 oz (94.212 kg). His oral temperature is 97.8 F (36.6 C). His blood pressure is 144/83 and his pulse is 80. His respiration is 20 and oxygen saturation is 99%.    Wt Readings from Last 3 Encounters:  05/17/13 207 lb 11.2 oz (94.212 kg)   05/16/13 208 lb 8 oz (94.575 kg)  05/10/13 206 lb 6.4 oz (93.622 kg)    tHe skin of the chest shows no significant radiation reaction. Lungs - Normal respiratory effort, chest expands symmetrically. Lungs are clear to auscultation, no crackles or wheezes.  Heart has regular rhythm and rate  Abdomen is soft and non tender with normal bowel sounds  Assessment:  Patient tolerating treatments well  Plan: Continue treatment per original radiation prescription

## 2013-05-17 NOTE — Progress Notes (Signed)
Pt denies pain, loss of appetite; he is fatigued, has SOB w/activity, dry cough.

## 2013-05-17 NOTE — Patient Instructions (Signed)

## 2013-05-17 NOTE — Progress Notes (Signed)
   Department of Radiation Oncology  Phone:  [phone] Fax:        [phone]  Simulation note  Today the patient was taken to the CT simulator suite. He was placed in the treatment position. He underwent rescanning to his chest region. The tumor in the left upper lung mass was noted and decreased in size. There appeared to less left upper lung collapse in addition. The tumor was outlined. The patient will have set up of his boost field based on his new imaging studies. A computerized 3-D isodose plan will be generated. The patient will receive 18 additional gray with his boost field.  -----------------------------------  Billie Lade, PhD, MD

## 2013-05-17 NOTE — Progress Notes (Signed)
Subjective:     Patient ID: Rodney Powers, male   DOB: [birth year], 57 y.o.   MRN: [id number]  HPI Pt here concerned with elevated blood sugars for the past couple mos, since he started chemo for lung cancer. He has been diagnosed with DM not long before the lung cancer dx was made.he has not been to DM ed but has researched the DM diet somewhat and has been avoiding things like white bread and candy. Fasting sugars have ranged from 189-500. During the day post meal they have ranged from 200's to a few times unreadable. He is taking only glipimeride. He has polyurea and polydipsia frequently but no GI sx or blurred vision. As part of his chemo regimin he is taking decadron daily.    Review of Systems per hpi     Objective:   Physical Exam  Nursing note and vitals reviewed. Constitutional: He appears well-developed and well-nourished.  Cardiovascular: Normal rate and regular rhythm.   Pulmonary/Chest: Effort normal and breath sounds normal.  Skin: Skin is warm.  Psychiatric: He has a normal mood and affect.       Assessment:     Diabetes - Plan: POCT glucose (manual entry), Hemoglobin A1c, Ambulatory referral to diabetic education, metFORMIN (GLUCOPHAGE) 500 MG tablet       Plan:     I suspect th steroids are playing a large role in his elevated sugars. With numbers as high as his have been, and the ongoing steroids, i suspect we will probably need to start insulin to gain control. However, will see how he does on metformin first, along with getting him some diabetic nutrition information. Will see him back in 2 weeks to reassess. In the meanwhile, we have discussed red flags to go to the ED for. He will call with any questions or concerns.

## 2013-05-17 NOTE — Progress Notes (Signed)
Patient was not scheduled for today States blood sugar this am was over 400 Has been running high

## 2013-05-18 ENCOUNTER — Encounter: Payer: Self-pay | Admitting: Oncology

## 2013-05-18 ENCOUNTER — Ambulatory Visit
Admission: RE | Admit: 2013-05-18 | Discharge: 2013-05-18 | Disposition: A | Discharge status: Home / Self Care | Payer: Medicaid Other | Source: Ambulatory Visit | Attending: Radiation Oncology | Admitting: Radiation Oncology

## 2013-05-18 NOTE — Progress Notes (Signed)
Wife came in to bring in bill for air conditioner bill. They have already paid the bill. I advised her must send check to business. I called Reita Cliche Molly Maduro) [place] and he said new address from [employer] of 200.00. It is [address] [place], [place] [address]. He said he will pay the patient back his 200.00 once he gets check from [place] for 200.00 and he had already spoke with the wife this morning.

## 2013-05-19 ENCOUNTER — Ambulatory Visit
Admission: RE | Admit: 2013-05-19 | Discharge: 2013-05-19 | Disposition: A | Discharge status: Home / Self Care | Payer: Medicaid Other | Source: Ambulatory Visit | Attending: Radiation Oncology | Admitting: Radiation Oncology

## 2013-05-20 ENCOUNTER — Ambulatory Visit
Admission: RE | Admit: 2013-05-20 | Discharge: 2013-05-20 | Disposition: A | Discharge status: Home / Self Care | Payer: Medicaid Other | Source: Ambulatory Visit | Attending: Radiation Oncology | Admitting: Radiation Oncology

## 2013-05-23 ENCOUNTER — Ambulatory Visit
Admission: RE | Admit: 2013-05-23 | Discharge: 2013-05-23 | Disposition: A | Discharge status: Home / Self Care | Payer: Medicaid Other | Source: Ambulatory Visit | Attending: Radiation Oncology | Admitting: Radiation Oncology

## 2013-05-23 ENCOUNTER — Ambulatory Visit (HOSPITAL_BASED_OUTPATIENT_CLINIC_OR_DEPARTMENT_OTHER): Payer: Medicaid Other

## 2013-05-23 ENCOUNTER — Other Ambulatory Visit (HOSPITAL_BASED_OUTPATIENT_CLINIC_OR_DEPARTMENT_OTHER): Payer: Medicaid Other

## 2013-05-23 ENCOUNTER — Ambulatory Visit (HOSPITAL_BASED_OUTPATIENT_CLINIC_OR_DEPARTMENT_OTHER): Payer: Medicaid Other | Admitting: Oncology

## 2013-05-23 ENCOUNTER — Encounter: Payer: Self-pay | Admitting: Oncology

## 2013-05-23 ENCOUNTER — Ambulatory Visit: Payer: Medicaid Other | Admitting: Nutrition

## 2013-05-23 DIAGNOSIS — C3410 Malignant neoplasm of upper lobe, unspecified bronchus or lung: Secondary | ICD-10-CM

## 2013-05-23 DIAGNOSIS — D72819 Decreased white blood cell count, unspecified: Secondary | ICD-10-CM

## 2013-05-23 DIAGNOSIS — D649 Anemia, unspecified: Secondary | ICD-10-CM

## 2013-05-23 DIAGNOSIS — Z5111 Encounter for antineoplastic chemotherapy: Secondary | ICD-10-CM

## 2013-05-23 DIAGNOSIS — R5381 Other malaise: Secondary | ICD-10-CM

## 2013-05-23 LAB — CBC WITH DIFFERENTIAL/PLATELET
Basophils Absolute: 0 10*3/uL (ref 0.0–0.1)
EOS%: 0.6 % (ref 0.0–7.0)
HCT: 35.7 % — ABNORMAL LOW (ref 38.4–49.9)
HGB: 11.2 g/dL — ABNORMAL LOW (ref 13.0–17.1)
LYMPH%: 15.1 % (ref 14.0–49.0)
MCH: 24.6 pg — ABNORMAL LOW (ref 27.2–33.4)
MCV: 78.3 fL — ABNORMAL LOW (ref 79.3–98.0)
MONO%: 9.5 % (ref 0.0–14.0)
NEUT%: 74.6 % (ref 39.0–75.0)

## 2013-05-23 LAB — BASIC METABOLIC PANEL (CC13)
BUN: 15.2 mg/dL (ref 7.0–26.0)
Calcium: 9.4 mg/dL (ref 8.4–10.4)
Glucose: 180 mg/dl — ABNORMAL HIGH (ref 70–99)

## 2013-05-23 MED ORDER — DEXAMETHASONE SODIUM PHOSPHATE 20 MG/5ML IJ SOLN
20.0000 mg | Freq: Once | INTRAMUSCULAR | Status: AC
  Administered 2013-05-23: 20 mg via INTRAVENOUS

## 2013-05-23 MED ORDER — SODIUM CHLORIDE 0.9 % IV SOLN
Freq: Once | INTRAVENOUS | Status: AC
  Administered 2013-05-23: 15:00:00 via INTRAVENOUS

## 2013-05-23 MED ORDER — DIPHENHYDRAMINE HCL 50 MG/ML IJ SOLN
50.0000 mg | Freq: Once | INTRAMUSCULAR | Status: AC
  Administered 2013-05-23: 50 mg via INTRAVENOUS

## 2013-05-23 MED ORDER — FAMOTIDINE IN NACL 20-0.9 MG/50ML-% IV SOLN
20.0000 mg | Freq: Once | INTRAVENOUS | Status: AC
  Administered 2013-05-23: 20 mg via INTRAVENOUS

## 2013-05-23 MED ORDER — SODIUM CHLORIDE 0.9 % IV SOLN
260.0000 mg | Freq: Once | INTRAVENOUS | Status: AC
  Administered 2013-05-23: 260 mg via INTRAVENOUS
  Filled 2013-05-23: qty 26

## 2013-05-23 MED ORDER — SODIUM CHLORIDE 0.9 % IV SOLN
40.5000 mg/m2 | Freq: Once | INTRAVENOUS | Status: AC
  Administered 2013-05-23: 90 mg via INTRAVENOUS
  Filled 2013-05-23: qty 15

## 2013-05-23 MED ORDER — ONDANSETRON 16 MG/50ML IVPB (CHCC)
16.0000 mg | Freq: Once | INTRAVENOUS | Status: AC
  Administered 2013-05-23: 16 mg via INTRAVENOUS

## 2013-05-23 NOTE — Progress Notes (Signed)
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   PAST THERAPY:  Biopsy only.   CURRENT THERAPY: started concurrent chemoradiation with weekly chemo Carboplatin/Taxol and daily XRT on 04/18/2013.   INTERVAL HISTORY: Rodney Powers 57 y.o. male returns for regular follow up by himself.  He has mild fatigue with chemo.  However, he is still independent of all activities of daily living.  He is still able to drive.  Despite having nicotine patch, he still smokes a few cigarettes a day. 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  The neuropathy did not worsen with chemo.  He denied dysphagia, odynophagia, SOB, cough, hemoptysis, fever, bleeding, nausea/vomiting, myalgia/arthralgia.  The rest of the 14-point review of system was negative.    Past Medical History  Diagnosis Date  . Back pain, chronic   . Diabetes mellitus, type II   . Mediastinal mass   . Anemia   . Numbness and tingling in hands   . Numbness and tingling of foot     bilateral  . Urinary retention   . COPD (chronic obstructive pulmonary disease)   . Lung cancer 04/07/2013    Past Surgical History  Procedure Laterality Date  . Video bronchoscopy with endobronchial ultrasound Bilateral 03/10/2013    Procedure: VIDEO BRONCHOSCOPY WITH ENDOBRONCHIAL ULTRASOUND;  Surgeon: Oretha Milch, MD;  Location: [facility] OR;  Service: Pulmonary;  Laterality: Bilateral;  . Mediastinotomy chamberlain mcneil Left 03/24/2013    Procedure: MEDIASTINOTOMY CHAMBERLAIN MCNEIL;  Surgeon: Loreli Slot, MD;  Location: [facility] OR;  Service: Thoracic;  Laterality: Left;  . Excisional hemorrhoidectomy      > 30 yrs ago    Current Outpatient Prescriptions  Medication Sig Dispense Refill  . dexamethasone (DECADRON) 4 MG tablet Take 1 tablet (4 mg total) by mouth 2  (two) times daily with a meal. Take 8 mg 2 times daily with meals starting the day after chemotherapy for 3 days.  30 tablet  1  . glimepiride (AMARYL) 1 MG tablet Take 1 tablet (1 mg total) by mouth daily before breakfast.  30 tablet  3  . glucose monitoring kit (FREESTYLE) monitoring kit 1 each by Does not apply route as needed for other.  1 each  0  . hyaluronate sodium (RADIAPLEXRX) GEL Apply topically 2 (two) times daily.      Marland Kitchen HYDROcodone-acetaminophen (NORCO) 5-325 MG per tablet Take 1 tablet by mouth every 6 (six) hours as needed for pain.  90 tablet  0  . LORazepam (ATIVAN) 0.5 MG tablet Take 1 tablet (0.5 mg total) by mouth every 6 (six) hours as needed. Take as needed for nausea and vomiting.  30 tablet  1  . metFORMIN (GLUCOPHAGE) 500 MG tablet 1 tab daily for 3 days, then 1 tab twice daily for 1 week. If tolerating well, increase to two tabs daily.  120 tablet  0  . Multiple Vitamin (MULTIVITAMIN WITH MINERALS) TABS Take 1 tablet by mouth daily.  30 tablet  3  . nicotine (NICODERM CQ) 14 mg/24hr patch Place 1 patch onto the skin daily.  28 patch  1  . nicotine (NICODERM CQ) 7 mg/24hr patch Place 1 patch onto the skin daily.  28 patch  0  . ondansetron (ZOFRAN) 8 MG tablet Take 1 tablet (8 mg total) by mouth every 12 (twelve)  hours as needed for nausea. Take 1 tablet 2 times daily starting the day after chemotherapy.  Then take 1 tablet two times a day as needed for nausea and vomiting.  30 tablet  1  . paclitaxel (TAXOL) 30 MG/5ML injection Inject as directed once a week.      . prochlorperazine (COMPAZINE) 10 MG tablet Take 1 tablet (10 mg total) by mouth every 6 (six) hours as needed. Take as needed for nausea and vomiting.  30 tablet  1  . prochlorperazine (COMPAZINE) 25 MG suppository Place 1 suppository (25 mg total) rectally every 12 (twelve) hours as needed for nausea.  12 suppository  3  . sucralfate (CARAFATE) 1 GM/10ML suspension Take 10 mLs (1 g total) by mouth 4 (four) times  daily.  420 mL  0  . tamsulosin (FLOMAX) 0.4 MG CAPS Take 1 capsule (0.4 mg total) by mouth daily after supper.  30 capsule  3   No current facility-administered medications for this visit.    ALLERGIES:  has No Known Allergies.  REVIEW OF SYSTEMS:  The rest of the 14-point review of system was negative.   Filed Vitals:   05/23/13 1313  BP: 146/76  Pulse: 62  Resp: 18   Wt Readings from Last 3 Encounters:  05/23/13 213 lb 3.2 oz (96.707 kg)  05/23/13 211 lb (95.709 kg)  05/17/13 207 lb (93.895 kg)   ECOG Performance status: ECOG 1.   PHYSICAL EXAMINATION:  General: well-nourished man, in no acute distress. Eyes: no scleral icterus. ENT: There were no oropharyngeal lesions. Neck was without thyromegaly. Lymphatics: Negative cervical, supraclavicular or axillary adenopathy. Respiratory: lungs were clear bilaterally without wheezing or crackles. Cardiovascular: Regular rate and rhythm, S1/S2, without murmur, rub or gallop. There was no pedal edema. GI: abdomen was soft, flat, nontender, nondistended, without organomegaly. Muscoloskeletal: no spinal tenderness of palpation of vertebral spine. Skin exam was without echymosis, petichae. Neuro exam was nonfocal. Patient was able to get on and off exam table without assistance. Gait was normal. Patient was alerted and oriented. Attention was good. Language was appropriate. Mood was normal without depression. Speech was not pressured. Thought content was not tangential.    LABORATORY/RADIOLOGY DATA:  Lab Results  Component Value Date   WBC 5.2 05/23/2013   HGB 11.2* 05/23/2013   HCT 35.7* 05/23/2013   PLT 155 05/23/2013   GLUCOSE 162* 05/16/2013   ALKPHOS 72 05/16/2013   ALT 18 05/16/2013   AST 11 05/16/2013   NA 136 05/16/2013   K 3.9 05/16/2013   CL 105 05/16/2013   CREATININE 0.9 05/16/2013   BUN 17.6 05/16/2013   CO2 23 05/16/2013   INR 1.01 03/23/2013   HGBA1C 7.9* 05/17/2013     ASSESSMENT AND PLAN:    1.  Diagnosis:  Squamous cell carcinoma of the  lung.  2.  Treatment:  Concurrent chemo radiation with daily XRT and weekly chemo Carboplatin/Taxol.  He has grade 1 fatigue, grade 1/2 leukopenia/anemia.   He had bilateral feet neuropathy before chemo started which has been stable for him. Recommend that he continue carboplatin and Taxol without further dose modification.  3.  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 Due to urgency to treat the much bulkier lung cancer, this has been on hold.  When he finishes chemoradiation for lung cancer, I will refer him back to Dr. Pollyann Kennedy for direct laryngoscopy and possible biopsy.   4.   Follow up: Once a week while on chemo.  The length of time of the face-to-face encounter was 15 minutes. More than 50% of time was spent counseling and coordination of care.

## 2013-05-23 NOTE — Patient Instructions (Addendum)
[facility] Discharge Instructions for Patients Receiving Chemotherapy  Today you received the following chemotherapy agents taxol, carboplatin  To help prevent nausea and vomiting after your treatment, we encourage you to take your nausea medication about 9 pm if needed   If you develop nausea and vomiting that is not controlled by your nausea medication, call the clinic.   BELOW ARE SYMPTOMS THAT SHOULD BE REPORTED IMMEDIATELY:  *FEVER GREATER THAN 100.5 F  *CHILLS WITH OR WITHOUT FEVER  NAUSEA AND VOMITING THAT IS NOT CONTROLLED WITH YOUR NAUSEA MEDICATION  *UNUSUAL SHORTNESS OF BREATH  *UNUSUAL BRUISING OR BLEEDING  TENDERNESS IN MOUTH AND THROAT WITH OR WITHOUT PRESENCE OF ULCERS  *URINARY PROBLEMS  *BOWEL PROBLEMS  UNUSUAL RASH Items with * indicate a potential emergency and should be followed up as soon as possible.  Feel free to call the clinic you have any questions or concerns. The clinic phone number is [phone].

## 2013-05-23 NOTE — Progress Notes (Signed)
Patient reports he is having no nutrition impact symptoms at this time. He has a great appetite. No nausea and vomiting and no diarrhea. He has some constipation but it is manageable. His weight has increased to 211 pounds as of today up from 207 pounds June 3.  Nutrition diagnosis: Unintended weight loss resolved.  Intervention: I encouraged patient to continue strategies for adequate oral intake including high-protein foods. I've reinforced reducing concentrated sweets for improved glycemic control. Teach back method used.   Monitoring, evaluation, goals: Patient has achieved adequate calories and protein to minimize further weight loss.  Next visit: Patient prefers to contact me for questions or concerns. There is no followup scheduled.

## 2013-05-24 ENCOUNTER — Ambulatory Visit
Admission: RE | Admit: 2013-05-24 | Discharge: 2013-05-24 | Disposition: A | Discharge status: Home / Self Care | Payer: Medicaid Other | Source: Ambulatory Visit | Attending: Radiation Oncology | Admitting: Radiation Oncology

## 2013-05-24 ENCOUNTER — Encounter: Payer: Self-pay | Admitting: Radiation Oncology

## 2013-05-24 NOTE — Progress Notes (Signed)
[facility]    Radiation Oncology [address] [place]     Maryln Gottron, M.D. [place], [place] [address]               Billie Lade, M.D., Ph.D. Phone: [phone]      Molli Hazard A. Kathrynn Running, M.D. Fax: [phone]      Radene Gunning, M.D., Ph.D.         Lurline Hare, M.D.         Grayland Jack, M.D Weekly Treatment Management Note  Name: Rodney Powers     MRN: [id number]        CSN: [id number] Date: 05/24/2013      DOB: 11-22-1956  CC: No PCP Per Patient         [name]    Status: Outpatient  Diagnosis: The encounter diagnosis was Lung cancer, left.  Current Dose: 45 Gy  Current Fraction: 25  Planned Dose: 63 Gy  Narrative: [name] was seen today for weekly treatment management. The chart was checked and CBCT  were reviewed. He continues to tolerate his treatments well. He has minimal esophageal symptoms. He is able to eat solid foods. He denies any breathing problems or significant coughing.  He does have some fatigue.  Review of patient's allergies indicates no known allergies.  Current Outpatient Prescriptions  Medication Sig Dispense Refill  . dexamethasone (DECADRON) 4 MG tablet Take 1 tablet (4 mg total) by mouth 2 (two) times daily with a meal. Take 8 mg 2 times daily with meals starting the day after chemotherapy for 3 days.  30 tablet  1  . glimepiride (AMARYL) 1 MG tablet Take 1 tablet (1 mg total) by mouth daily before breakfast.  30 tablet  3  . glucose monitoring kit (FREESTYLE) monitoring kit 1 each by Does not apply route as needed for other.  1 each  0  . hyaluronate sodium (RADIAPLEXRX) GEL Apply topically 2 (two) times daily.      Marland Kitchen HYDROcodone-acetaminophen (NORCO) 5-325 MG per tablet Take 1 tablet by mouth every 6 (six) hours as needed for pain.  90 tablet  0  . LORazepam (ATIVAN) 0.5 MG tablet Take 1 tablet (0.5 mg total) by mouth every 6 (six) hours as needed. Take as needed for nausea and vomiting.  30 tablet  1  .  metFORMIN (GLUCOPHAGE) 500 MG tablet 1 tab daily for 3 days, then 1 tab twice daily for 1 week. If tolerating well, increase to two tabs daily.  120 tablet  0  . Multiple Vitamin (MULTIVITAMIN WITH MINERALS) TABS Take 1 tablet by mouth daily.  30 tablet  3  . nicotine (NICODERM CQ) 14 mg/24hr patch Place 1 patch onto the skin daily.  28 patch  1  . nicotine (NICODERM CQ) 7 mg/24hr patch Place 1 patch onto the skin daily.  28 patch  0  . ondansetron (ZOFRAN) 8 MG tablet Take 1 tablet (8 mg total) by mouth every 12 (twelve) hours as needed for nausea. Take 1 tablet 2 times daily starting the day after chemotherapy.  Then take 1 tablet two times a day as needed for nausea and vomiting.  30 tablet  1  . paclitaxel (TAXOL) 30 MG/5ML injection Inject as directed once a week.      . prochlorperazine (COMPAZINE) 10 MG tablet Take 1 tablet (10 mg total) by mouth every 6 (six) hours as needed. Take as needed for nausea and vomiting.  30 tablet  1  . prochlorperazine (COMPAZINE) 25 MG suppository Place 1 suppository (25 mg total) rectally every 12 (twelve) hours as needed for nausea.  12 suppository  3  . sucralfate (CARAFATE) 1 GM/10ML suspension Take 10 mLs (1 g total) by mouth 4 (four) times daily.  420 mL  0  . tamsulosin (FLOMAX) 0.4 MG CAPS Take 1 capsule (0.4 mg total) by mouth daily after supper.  30 capsule  3   No current facility-administered medications for this encounter.   Labs:  Lab Results  Component Value Date   WBC 5.2 05/23/2013   HGB 11.2* 05/23/2013   HCT 35.7* 05/23/2013   MCV 78.3* 05/23/2013   PLT 155 05/23/2013   Lab Results  Component Value Date   CREATININE 1.0 05/23/2013   BUN 15.2 05/23/2013   NA 139 05/23/2013   K 3.6 05/23/2013   CL 105 05/23/2013   CO2 24 05/23/2013   Lab Results  Component Value Date   ALT 18 05/16/2013   AST 11 05/16/2013   BILITOT 0.39 05/16/2013    Physical Examination:  weight is 213 lb 8 oz (96.843 kg). His oral temperature is 97.9 F (36.6 C). His blood  pressure is 137/73 and his pulse is 67. His respiration is 20 and oxygen saturation is 99%.    Wt Readings from Last 3 Encounters:  05/24/13 213 lb 8 oz (96.843 kg)  05/23/13 213 lb 3.2 oz (96.707 kg)  05/23/13 211 lb (95.709 kg)     Lungs - Normal respiratory effort, chest expands symmetrically. Lungs are clear to auscultation, no crackles or wheezes.  Heart has regular rhythm and rate  Abdomen is soft and non tender with normal bowel sounds  Assessment:  Patient tolerating treatments well  Plan: Continue treatment per original radiation prescription

## 2013-05-24 NOTE — Progress Notes (Signed)
Pt states he "has a little pain when he swallows". He denies other pain, denies loss of appetite, cough, SOB. Pt continues to smoke occasionally, not wearing Nicotine patch today. He is fatigued.

## 2013-05-25 ENCOUNTER — Encounter: Payer: Self-pay | Admitting: Radiation Oncology

## 2013-05-25 ENCOUNTER — Ambulatory Visit
Admission: RE | Admit: 2013-05-25 | Discharge: 2013-05-25 | Disposition: A | Discharge status: Home / Self Care | Payer: Medicaid Other | Source: Ambulatory Visit | Attending: Radiation Oncology | Admitting: Radiation Oncology

## 2013-05-25 NOTE — Progress Notes (Signed)
  Radiation Oncology         [phone] ________________________________  Name: Rodney Powers MRN: [id number]  Date: 05/25/2013  DOB: [birth year]  Simulation Verification Note  Status: outpatient  NARRATIVE: The patient was brought to the treatment unit and placed in the planned treatment position. The clinical setup was verified. Then port films were obtained and uploaded to the radiation oncology medical record software.  The treatment beams were carefully compared against the planned radiation fields. The position location and shape of the radiation fields was reviewed. They targeted volume of tissue appears to be appropriately covered by the radiation beams. Organs at risk appear to be excluded as planned.  Based on my personal review, I approved the simulation verification. The patient's treatment will proceed as planned.  -----------------------------------  Billie Lade, PhD, MD

## 2013-05-26 ENCOUNTER — Ambulatory Visit
Admission: RE | Admit: 2013-05-26 | Discharge: 2013-05-26 | Disposition: A | Discharge status: Home / Self Care | Payer: Medicaid Other | Source: Ambulatory Visit | Attending: Radiation Oncology | Admitting: Radiation Oncology

## 2013-05-27 ENCOUNTER — Ambulatory Visit
Admission: RE | Admit: 2013-05-27 | Discharge: 2013-05-27 | Disposition: A | Discharge status: Home / Self Care | Payer: Medicaid Other | Source: Ambulatory Visit | Attending: Radiation Oncology | Admitting: Radiation Oncology

## 2013-05-29 NOTE — Patient Instructions (Addendum)
DIAGNOSIS: Stage III nonsmall cell lung cancer the left lung; squamous cell carcinoma.  CURRENT THERAPY: started concurrent chemoradiation with weekly chemo Carboplatin/Taxol and daily XRT on 04/18/2013; until las chemo 06/06/13 and last radiation on 06/07/13.  To reassess disease status:  Will obtain repeat scan in about 2 months.

## 2013-05-30 ENCOUNTER — Ambulatory Visit
Admission: RE | Admit: 2013-05-30 | Discharge: 2013-05-30 | Disposition: A | Discharge status: Home / Self Care | Payer: Medicaid Other | Source: Ambulatory Visit | Attending: Radiation Oncology | Admitting: Radiation Oncology

## 2013-05-30 ENCOUNTER — Ambulatory Visit (HOSPITAL_BASED_OUTPATIENT_CLINIC_OR_DEPARTMENT_OTHER): Payer: Medicaid Other

## 2013-05-30 ENCOUNTER — Ambulatory Visit (HOSPITAL_BASED_OUTPATIENT_CLINIC_OR_DEPARTMENT_OTHER): Payer: Medicaid Other | Admitting: Oncology

## 2013-05-30 ENCOUNTER — Ambulatory Visit: Payer: Medicaid Other | Admitting: Oncology

## 2013-05-30 ENCOUNTER — Other Ambulatory Visit: Payer: Medicaid Other | Admitting: Lab

## 2013-05-30 ENCOUNTER — Other Ambulatory Visit (HOSPITAL_BASED_OUTPATIENT_CLINIC_OR_DEPARTMENT_OTHER): Payer: Medicaid Other | Admitting: Lab

## 2013-05-30 ENCOUNTER — Encounter: Payer: Self-pay | Admitting: Oncology

## 2013-05-30 DIAGNOSIS — Z5111 Encounter for antineoplastic chemotherapy: Secondary | ICD-10-CM

## 2013-05-30 DIAGNOSIS — D72819 Decreased white blood cell count, unspecified: Secondary | ICD-10-CM

## 2013-05-30 DIAGNOSIS — C3400 Malignant neoplasm of unspecified main bronchus: Secondary | ICD-10-CM

## 2013-05-30 DIAGNOSIS — E1149 Type 2 diabetes mellitus with other diabetic neurological complication: Secondary | ICD-10-CM

## 2013-05-30 DIAGNOSIS — E1142 Type 2 diabetes mellitus with diabetic polyneuropathy: Secondary | ICD-10-CM

## 2013-05-30 LAB — CBC WITH DIFFERENTIAL/PLATELET
BASO%: 0.3 % (ref 0.0–2.0)
EOS%: 0.8 % (ref 0.0–7.0)
HCT: 35.6 % — ABNORMAL LOW (ref 38.4–49.9)
LYMPH%: 9.4 % — ABNORMAL LOW (ref 14.0–49.0)
MCH: 25.2 pg — ABNORMAL LOW (ref 27.2–33.4)
MCHC: 31.5 g/dL — ABNORMAL LOW (ref 32.0–36.0)
MONO#: 0.5 10*3/uL (ref 0.1–0.9)
NEUT%: 76 % — ABNORMAL HIGH (ref 39.0–75.0)
Platelets: 163 10*3/uL (ref 140–400)
RBC: 4.44 10*6/uL (ref 4.20–5.82)
WBC: 3.9 10*3/uL — ABNORMAL LOW (ref 4.0–10.3)
lymph#: 0.4 10*3/uL — ABNORMAL LOW (ref 0.9–3.3)
nRBC: 0 % (ref 0–0)

## 2013-05-30 LAB — BASIC METABOLIC PANEL (CC13)
BUN: 15 mg/dL (ref 7.0–26.0)
Creatinine: 0.9 mg/dL (ref 0.7–1.3)

## 2013-05-30 LAB — TECHNOLOGIST REVIEW

## 2013-05-30 MED ORDER — ONDANSETRON 16 MG/50ML IVPB (CHCC)
16.0000 mg | Freq: Once | INTRAVENOUS | Status: AC
  Administered 2013-05-30: 16 mg via INTRAVENOUS

## 2013-05-30 MED ORDER — FAMOTIDINE IN NACL 20-0.9 MG/50ML-% IV SOLN
20.0000 mg | Freq: Once | INTRAVENOUS | Status: AC
  Administered 2013-05-30: 20 mg via INTRAVENOUS

## 2013-05-30 MED ORDER — SODIUM CHLORIDE 0.9 % IV SOLN
40.5000 mg/m2 | Freq: Once | INTRAVENOUS | Status: AC
  Administered 2013-05-30: 90 mg via INTRAVENOUS
  Filled 2013-05-30: qty 15

## 2013-05-30 MED ORDER — DEXAMETHASONE SODIUM PHOSPHATE 20 MG/5ML IJ SOLN
20.0000 mg | Freq: Once | INTRAMUSCULAR | Status: AC
  Administered 2013-05-30: 20 mg via INTRAVENOUS

## 2013-05-30 MED ORDER — SODIUM CHLORIDE 0.9 % IV SOLN
Freq: Once | INTRAVENOUS | Status: AC
  Administered 2013-05-30: 11:00:00 via INTRAVENOUS

## 2013-05-30 MED ORDER — DIPHENHYDRAMINE HCL 50 MG/ML IJ SOLN
50.0000 mg | Freq: Once | INTRAMUSCULAR | Status: AC
  Administered 2013-05-30: 50 mg via INTRAVENOUS

## 2013-05-30 MED ORDER — SODIUM CHLORIDE 0.9 % IV SOLN
261.0000 mg | Freq: Once | INTRAVENOUS | Status: AC
  Administered 2013-05-30: 260 mg via INTRAVENOUS
  Filled 2013-05-30: qty 26

## 2013-05-30 NOTE — Progress Notes (Signed)
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   PAST THERAPY:  Biopsy only.   CURRENT THERAPY: started concurrent chemoradiation with weekly chemo Carboplatin/Taxol and daily XRT on 04/18/2013.   INTERVAL HISTORY: Rodney Powers 57 y.o. male returns for regular follow up with his wife.  He reports doing well. He has mild neuropathy in the feet which predated chemo from his diabetes.  His neuropathy has not significantly worsened with chemo.  He has mild fatigue; however, he is independent of all activities of daily living.  He has good appetite and is gaining weight.  Patient denies fever, anorexia, weight loss, fatigue, headache, visual changes, confusion, drenching night sweats, palpable lymph node swelling, mucositis, odynophagia, dysphagia, nausea vomiting, jaundice, chest pain, palpitation, shortness of breath, dyspnea on exertion, productive cough, gum bleeding, epistaxis, hematemesis, hemoptysis, abdominal pain, abdominal swelling, early satiety, melena, hematochezia, hematuria, skin rash, spontaneous bleeding, joint swelling, joint pain, heat or cold intolerance, bowel bladder incontinence, back pain, focal motor weakness.    Past Medical History  Diagnosis Date  . Back pain, chronic   . Diabetes mellitus, type II   . Mediastinal mass   . Anemia   . Urinary retention   . COPD (chronic obstructive pulmonary disease)   . Lung cancer 04/07/2013  . Diabetic neuropathy     Past Surgical History  Procedure Laterality Date  . Video bronchoscopy with endobronchial ultrasound Bilateral 03/10/2013    Procedure: VIDEO BRONCHOSCOPY WITH ENDOBRONCHIAL ULTRASOUND;  Surgeon: Oretha Milch, MD;  Location: [facility] OR;  Service: Pulmonary;  Laterality: Bilateral;  . Mediastinotomy chamberlain mcneil Left 03/24/2013    Procedure:  MEDIASTINOTOMY CHAMBERLAIN MCNEIL;  Surgeon: Loreli Slot, MD;  Location: [facility] OR;  Service: Thoracic;  Laterality: Left;  . Excisional hemorrhoidectomy      > 30 yrs ago    Current Outpatient Prescriptions  Medication Sig Dispense Refill  . dexamethasone (DECADRON) 4 MG tablet Take 1 tablet (4 mg total) by mouth 2 (two) times daily with a meal. Take 8 mg 2 times daily with meals starting the day after chemotherapy for 3 days.  30 tablet  1  . glimepiride (AMARYL) 1 MG tablet Take 1 tablet (1 mg total) by mouth daily before breakfast.  30 tablet  3  . glucose monitoring kit (FREESTYLE) monitoring kit 1 each by Does not apply route as needed for other.  1 each  0  . hyaluronate sodium (RADIAPLEXRX) GEL Apply topically 2 (two) times daily.      Marland Kitchen HYDROcodone-acetaminophen (NORCO) 5-325 MG per tablet Take 1 tablet by mouth every 6 (six) hours as needed for pain.  90 tablet  0  . LORazepam (ATIVAN) 0.5 MG tablet Take 1 tablet (0.5 mg total) by mouth every 6 (six) hours as needed. Take as needed for nausea and vomiting.  30 tablet  1  . metFORMIN (GLUCOPHAGE) 500 MG tablet 1 tab daily for 3 days, then 1 tab twice daily for 1 week. If tolerating well, increase to two tabs daily.  120 tablet  0  . Multiple Vitamin (MULTIVITAMIN WITH MINERALS) TABS Take 1 tablet by mouth daily.  30 tablet  3  . nicotine (NICODERM CQ) 14 mg/24hr patch Place 1 patch onto the skin daily.  28 patch  1  . nicotine (NICODERM CQ) 7 mg/24hr patch Place  1 patch onto the skin daily.  28 patch  0  . ondansetron (ZOFRAN) 8 MG tablet Take 1 tablet (8 mg total) by mouth every 12 (twelve) hours as needed for nausea. Take 1 tablet 2 times daily starting the day after chemotherapy.  Then take 1 tablet two times a day as needed for nausea and vomiting.  30 tablet  1  . paclitaxel (TAXOL) 30 MG/5ML injection Inject as directed once a week.      . prochlorperazine (COMPAZINE) 10 MG tablet Take 1 tablet (10 mg total) by mouth every 6  (six) hours as needed. Take as needed for nausea and vomiting.  30 tablet  1  . prochlorperazine (COMPAZINE) 25 MG suppository Place 1 suppository (25 mg total) rectally every 12 (twelve) hours as needed for nausea.  12 suppository  3  . sucralfate (CARAFATE) 1 GM/10ML suspension Take 10 mLs (1 g total) by mouth 4 (four) times daily.  420 mL  0  . tamsulosin (FLOMAX) 0.4 MG CAPS Take 1 capsule (0.4 mg total) by mouth daily after supper.  30 capsule  3   No current facility-administered medications for this visit.    ALLERGIES:  has No Known Allergies.  REVIEW OF SYSTEMS:  The rest of the 14-point review of system was negative.   Filed Vitals:   05/30/13 0855  BP: 147/76  Pulse: 79  Temp: 97.1 F (36.2 C)  Resp: 18   Wt Readings from Last 3 Encounters:  05/30/13 216 lb 1.6 oz (98.022 kg)  05/24/13 213 lb 8 oz (96.843 kg)  05/23/13 213 lb 3.2 oz (96.707 kg)   ECOG Performance status: ECOG 1.   PHYSICAL EXAMINATION:  General: well-nourished man, in no acute distress. Eyes: no scleral icterus. ENT: There were no oropharyngeal lesions. Neck was without thyromegaly. Lymphatics: Negative cervical, supraclavicular or axillary adenopathy. Respiratory: lungs were clear bilaterally without wheezing or crackles. Cardiovascular: Regular rate and rhythm, S1/S2, without murmur, rub or gallop. There was no pedal edema. GI: abdomen was soft, flat, nontender, nondistended, without organomegaly. Muscoloskeletal: no spinal tenderness of palpation of vertebral spine. Skin exam was without echymosis, petichae. Neuro exam was nonfocal. Patient was able to get on and off exam table without assistance. Gait was normal. Patient was alerted and oriented. Attention was good. Language was appropriate. Mood was normal without depression. Speech was not pressured. Thought content was not tangential.       LABORATORY/RADIOLOGY DATA:  Lab Results  Component Value Date   WBC 3.9* 05/30/2013   HGB 11.2* 05/30/2013    HCT 35.6* 05/30/2013   PLT 163 05/30/2013   GLUCOSE 132* 05/30/2013   ALKPHOS 72 05/16/2013   ALT 18 05/16/2013   AST 11 05/16/2013   NA 139 05/30/2013   K 3.7 05/30/2013   CL 107 05/30/2013   CREATININE 0.9 05/30/2013   BUN 15.0 05/30/2013   CO2 21* 05/30/2013   INR 1.01 03/23/2013   HGBA1C 7.9* 05/17/2013     ASSESSMENT AND PLAN:    1.  Diagnosis:  Squamous cell carcinoma of the lung.  2.  Treatment:  Concurrent chemo radiation with daily XRT and weekly chemo Carboplatin/Taxol. He is doing relatively well with chemo.  He has grade 1 fatigue; leukopenia without neutropenia. His diabetic neuropathy predated chemo and has not worsened with chemo.  I recommended to proceed with chemo this week without dose modification.  If his neuropathy worsens significantly next week or he develops neutropenia, we may consider canceling next week.  3.  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 Due to urgency to treat the much bulkier lung cancer, this has been on hold.  When he finishes chemoradiation for lung cancer, I will refer him back to Dr. Pollyann Kennedy for direct laryngoscopy and possible biopsy.   4.   Follow up: Once a week while on chemo.   The length of time of the face-to-face encounter was 15 minutes. More than 50% of time was spent counseling and coordination of care.

## 2013-05-30 NOTE — Patient Instructions (Signed)
[facility] Discharge Instructions for Patients Receiving Chemotherapy  Today you received the following chemotherapy agents taxol/ carboplatin  To help prevent nausea and vomiting after your treatment, we encourage you to take your nausea medication as needed   If you develop nausea and vomiting that is not controlled by your nausea medication, call the clinic.   BELOW ARE SYMPTOMS THAT SHOULD BE REPORTED IMMEDIATELY:  *FEVER GREATER THAN 100.5 F  *CHILLS WITH OR WITHOUT FEVER  NAUSEA AND VOMITING THAT IS NOT CONTROLLED WITH YOUR NAUSEA MEDICATION  *UNUSUAL SHORTNESS OF BREATH  *UNUSUAL BRUISING OR BLEEDING  TENDERNESS IN MOUTH AND THROAT WITH OR WITHOUT PRESENCE OF ULCERS  *URINARY PROBLEMS  *BOWEL PROBLEMS  UNUSUAL RASH Items with * indicate a potential emergency and should be followed up as soon as possible.  Feel free to call the clinic you have any questions or concerns. The clinic phone number is [phone].

## 2013-05-31 ENCOUNTER — Ambulatory Visit
Admission: RE | Admit: 2013-05-31 | Discharge: 2013-05-31 | Disposition: A | Discharge status: Home / Self Care | Payer: Medicaid Other | Source: Ambulatory Visit | Attending: Radiation Oncology | Admitting: Radiation Oncology

## 2013-05-31 ENCOUNTER — Encounter: Payer: Self-pay | Admitting: Radiation Oncology

## 2013-05-31 NOTE — Progress Notes (Signed)
[facility]    Radiation Oncology [address] [place]     Maryln Gottron, M.D. [place], [place] [address]               Billie Lade, M.D., Ph.D. Phone: [phone]      Molli Hazard A. Kathrynn Running, M.D. Fax: [phone]      Radene Gunning, M.D., Ph.D.         Lurline Hare, M.D.         Grayland Jack, M.D Weekly Treatment Management Note  Name: Rodney Powers     MRN: [id number]        CSN: [id number] Date: 05/31/2013      DOB: [birth year]  CC: No PCP Per Patient         [name]    Status: Outpatient  Diagnosis: The encounter diagnosis was Lung cancer, left.  Current Dose: 54  Current Fraction: 30  Planned Dose: 63 Gy  Narrative: Rodney Powers was seen today for weekly treatment management. The chart was checked and CBCT  were reviewed. He is having some fatigue at this time. He has minimal esophageal symptoms.  He has some dyspnea with exertion.  He seems to be tolerating chemotherapy well.  Review of patient's allergies indicates no known allergies. Current Outpatient Prescriptions  Medication Sig Dispense Refill  . dexamethasone (DECADRON) 4 MG tablet Take 1 tablet (4 mg total) by mouth 2 (two) times daily with a meal. Take 8 mg 2 times daily with meals starting the day after chemotherapy for 3 days.  30 tablet  1  . glimepiride (AMARYL) 1 MG tablet Take 1 tablet (1 mg total) by mouth daily before breakfast.  30 tablet  3  . glucose monitoring kit (FREESTYLE) monitoring kit 1 each by Does not apply route as needed for other.  1 each  0  . hyaluronate sodium (RADIAPLEXRX) GEL Apply topically 2 (two) times daily.      Marland Kitchen HYDROcodone-acetaminophen (NORCO) 5-325 MG per tablet Take 1 tablet by mouth every 6 (six) hours as needed for pain.  90 tablet  0  . LORazepam (ATIVAN) 0.5 MG tablet Take 1 tablet (0.5 mg total) by mouth every 6 (six) hours as needed. Take as needed for nausea and vomiting.  30 tablet  1  . metFORMIN (GLUCOPHAGE) 500 MG tablet 1 tab daily  for 3 days, then 1 tab twice daily for 1 week. If tolerating well, increase to two tabs daily.  120 tablet  0  . Multiple Vitamin (MULTIVITAMIN WITH MINERALS) TABS Take 1 tablet by mouth daily.  30 tablet  3  . nicotine (NICODERM CQ) 14 mg/24hr patch Place 1 patch onto the skin daily.  28 patch  1  . nicotine (NICODERM CQ) 7 mg/24hr patch Place 1 patch onto the skin daily.  28 patch  0  . ondansetron (ZOFRAN) 8 MG tablet Take 1 tablet (8 mg total) by mouth every 12 (twelve) hours as needed for nausea. Take 1 tablet 2 times daily starting the day after chemotherapy.  Then take 1 tablet two times a day as needed for nausea and vomiting.  30 tablet  1  . paclitaxel (TAXOL) 30 MG/5ML injection Inject as directed once a week.      . prochlorperazine (COMPAZINE) 10 MG tablet Take 1 tablet (10 mg total) by mouth every 6 (six) hours as needed. Take as needed for nausea and vomiting.  30 tablet  1  . prochlorperazine (COMPAZINE) 25 MG  suppository Place 1 suppository (25 mg total) rectally every 12 (twelve) hours as needed for nausea.  12 suppository  3  . sucralfate (CARAFATE) 1 GM/10ML suspension Take 10 mLs (1 g total) by mouth 4 (four) times daily.  420 mL  0  . tamsulosin (FLOMAX) 0.4 MG CAPS Take 1 capsule (0.4 mg total) by mouth daily after supper.  30 capsule  3   No current facility-administered medications for this encounter.   Labs:  Lab Results  Component Value Date   WBC 3.9* 05/30/2013   HGB 11.2* 05/30/2013   HCT 35.6* 05/30/2013   MCV 80.2 05/30/2013   PLT 163 05/30/2013   Lab Results  Component Value Date   CREATININE 0.9 05/30/2013   BUN 15.0 05/30/2013   NA 139 05/30/2013   K 3.7 05/30/2013   CL 107 05/30/2013   CO2 21* 05/30/2013   Lab Results  Component Value Date   ALT 18 05/16/2013   AST 11 05/16/2013   BILITOT 0.39 05/16/2013    Physical Examination:  weight is 214 lb 11.2 oz (97.387 kg). His oral temperature is 97.9 F (36.6 C). His blood pressure is 148/76 and his pulse is 68.  His respiration is 20 and oxygen saturation is 99%.    Wt Readings from Last 3 Encounters:  05/31/13 214 lb 11.2 oz (97.387 kg)  05/30/13 216 lb 1.6 oz (98.022 kg)  05/24/13 213 lb 8 oz (96.843 kg)    Hyperpigmentation changes are noted in the radiation portals without any skin breakdown. Lungs - Normal respiratory effort, chest expands symmetrically. Lungs are clear to auscultation, no crackles or wheezes.  Heart has regular rhythm and rate  Abdomen is soft and non tender with normal bowel sounds  Assessment:  Patient tolerating treatments well with minimal symptoms.  Plan: Continue treatment per original radiation prescription

## 2013-05-31 NOTE — Progress Notes (Signed)
Pt denies pain, loss of appetite. He reports occasional dry cough, SOB w/exertion, fatigue. He states he "feels it go down" when he eats but it is not painful. He is applying Radiaplex to left chest tx area, denies desquamation.

## 2013-06-01 ENCOUNTER — Ambulatory Visit: Payer: Medicaid Other

## 2013-06-01 ENCOUNTER — Ambulatory Visit
Admission: RE | Admit: 2013-06-01 | Discharge: 2013-06-01 | Disposition: A | Discharge status: Home / Self Care | Payer: Medicaid Other | Source: Ambulatory Visit | Attending: Radiation Oncology | Admitting: Radiation Oncology

## 2013-06-02 ENCOUNTER — Ambulatory Visit
Admission: RE | Admit: 2013-06-02 | Discharge: 2013-06-02 | Disposition: A | Discharge status: Home / Self Care | Payer: Medicaid Other | Source: Ambulatory Visit | Attending: Radiation Oncology | Admitting: Radiation Oncology

## 2013-06-03 ENCOUNTER — Ambulatory Visit
Admission: RE | Admit: 2013-06-03 | Discharge: 2013-06-03 | Disposition: A | Discharge status: Home / Self Care | Payer: Medicaid Other | Source: Ambulatory Visit | Attending: Radiation Oncology | Admitting: Radiation Oncology

## 2013-06-06 ENCOUNTER — Ambulatory Visit (HOSPITAL_BASED_OUTPATIENT_CLINIC_OR_DEPARTMENT_OTHER): Payer: Medicaid Other

## 2013-06-06 ENCOUNTER — Other Ambulatory Visit (HOSPITAL_BASED_OUTPATIENT_CLINIC_OR_DEPARTMENT_OTHER): Payer: Medicaid Other | Admitting: Lab

## 2013-06-06 ENCOUNTER — Ambulatory Visit (HOSPITAL_BASED_OUTPATIENT_CLINIC_OR_DEPARTMENT_OTHER): Payer: Medicaid Other | Admitting: Oncology

## 2013-06-06 ENCOUNTER — Ambulatory Visit
Admission: RE | Admit: 2013-06-06 | Discharge: 2013-06-06 | Disposition: A | Discharge status: Home / Self Care | Payer: Medicaid Other | Source: Ambulatory Visit | Attending: Radiation Oncology | Admitting: Radiation Oncology

## 2013-06-06 VITALS — BP 142/85 | Temp 86.0°F | Resp 20

## 2013-06-06 DIAGNOSIS — C3410 Malignant neoplasm of upper lobe, unspecified bronchus or lung: Secondary | ICD-10-CM

## 2013-06-06 DIAGNOSIS — E1149 Type 2 diabetes mellitus with other diabetic neurological complication: Secondary | ICD-10-CM

## 2013-06-06 DIAGNOSIS — Z5111 Encounter for antineoplastic chemotherapy: Secondary | ICD-10-CM

## 2013-06-06 DIAGNOSIS — D72819 Decreased white blood cell count, unspecified: Secondary | ICD-10-CM

## 2013-06-06 DIAGNOSIS — R918 Other nonspecific abnormal finding of lung field: Secondary | ICD-10-CM | POA: Insufficient documentation

## 2013-06-06 DIAGNOSIS — E1142 Type 2 diabetes mellitus with diabetic polyneuropathy: Secondary | ICD-10-CM

## 2013-06-06 LAB — CBC WITH DIFFERENTIAL/PLATELET
EOS%: 1 % (ref 0.0–7.0)
MCHC: 31.5 g/dL — ABNORMAL LOW (ref 32.0–36.0)
MONO#: 0.4 10*3/uL (ref 0.1–0.9)
RBC: 4.48 10*6/uL (ref 4.20–5.82)
WBC: 3.1 10*3/uL — ABNORMAL LOW (ref 4.0–10.3)
lymph#: 0.3 10*3/uL — ABNORMAL LOW (ref 0.9–3.3)

## 2013-06-06 LAB — BASIC METABOLIC PANEL (CC13)
BUN: 11.6 mg/dL (ref 7.0–26.0)
CO2: 19 mEq/L — ABNORMAL LOW (ref 22–29)
Calcium: 9.5 mg/dL (ref 8.4–10.4)
Glucose: 258 mg/dl — ABNORMAL HIGH (ref 70–99)
Sodium: 137 mEq/L (ref 136–145)

## 2013-06-06 LAB — TECHNOLOGIST REVIEW

## 2013-06-06 MED ORDER — SODIUM CHLORIDE 0.9 % IV SOLN
Freq: Once | INTRAVENOUS | Status: AC
  Administered 2013-06-06: 11:00:00 via INTRAVENOUS

## 2013-06-06 MED ORDER — FAMOTIDINE IN NACL 20-0.9 MG/50ML-% IV SOLN
20.0000 mg | Freq: Once | INTRAVENOUS | Status: AC
  Administered 2013-06-06: 20 mg via INTRAVENOUS

## 2013-06-06 MED ORDER — HYDROCODONE-ACETAMINOPHEN 5-325 MG PO TABS: 1.0000 | ORAL_TABLET | Freq: Four times a day (QID) | ORAL | Status: DC | PRN

## 2013-06-06 MED ORDER — RADIAPLEXRX EX GEL: 1.0000 "application " | Freq: Two times a day (BID) | CUTANEOUS | Status: DC

## 2013-06-06 MED ORDER — SODIUM CHLORIDE 0.9 % IV SOLN
40.5000 mg/m2 | Freq: Once | INTRAVENOUS | Status: AC
  Administered 2013-06-06: 90 mg via INTRAVENOUS
  Filled 2013-06-06: qty 15

## 2013-06-06 MED ORDER — SODIUM CHLORIDE 0.9 % IV SOLN
261.0000 mg | Freq: Once | INTRAVENOUS | Status: AC
  Administered 2013-06-06: 260 mg via INTRAVENOUS
  Filled 2013-06-06: qty 26

## 2013-06-06 MED ORDER — DIPHENHYDRAMINE HCL 50 MG/ML IJ SOLN
50.0000 mg | Freq: Once | INTRAMUSCULAR | Status: AC
  Administered 2013-06-06: 50 mg via INTRAVENOUS

## 2013-06-06 MED ORDER — ONDANSETRON 16 MG/50ML IVPB (CHCC)
16.0000 mg | Freq: Once | INTRAVENOUS | Status: AC
  Administered 2013-06-06: 16 mg via INTRAVENOUS

## 2013-06-06 MED ORDER — DEXAMETHASONE SODIUM PHOSPHATE 20 MG/5ML IJ SOLN
20.0000 mg | Freq: Once | INTRAMUSCULAR | Status: AC
  Administered 2013-06-06: 20 mg via INTRAVENOUS

## 2013-06-06 NOTE — Progress Notes (Signed)
[facility]  Telephone:(336) [phone] Fax:(336) [phone]   OFFICE PROGRESS NOTE   Cc:  No PCP Per Patient  DIAGNOSIS:  Stage III nonsmall cell lung cancer the left lung; squamous cell carcinoma.   PAST THERAPY:  Biopsy only.   CURRENT THERAPY: started concurrent chemoradiation with weekly chemo Carboplatin/Taxol and daily XRT on 04/18/2013.   INTERVAL HISTORY: Rodney Powers 57 y.o. male returns for regular follow up with his wife.  He reports doing well. He has mild neuropathy in the feet which predated chemo from his diabetes.  His neuropathy has not significantly worsened with chemo.  He has mild fatigue; however, he is independent of all activities of daily living.  He has good appetite and is gaining weight.    Patient denies fever, anorexia, weight loss, fatigue, headache, visual changes, confusion, drenching night sweats, palpable lymph node swelling, mucositis, odynophagia, dysphagia, nausea vomiting, jaundice, chest pain, palpitation, shortness of breath, dyspnea on exertion, productive cough, gum bleeding, epistaxis, hematemesis, hemoptysis, abdominal pain, abdominal swelling, early satiety, melena, hematochezia, hematuria, skin rash, spontaneous bleeding, joint swelling, joint pain, heat or cold intolerance, bowel bladder incontinence, back pain, focal motor weakness.    Past Medical History  Diagnosis Date  . Back pain, chronic   . Diabetes mellitus, type II   . Mediastinal mass   . Anemia   . Urinary retention   . COPD (chronic obstructive pulmonary disease)   . Lung cancer 04/07/2013  . Diabetic neuropathy     Past Surgical History  Procedure Laterality Date  . Video bronchoscopy with endobronchial ultrasound Bilateral 03/10/2013    Procedure: VIDEO BRONCHOSCOPY WITH ENDOBRONCHIAL ULTRASOUND;  Surgeon: Oretha Milch, MD;  Location: [facility] OR;  Service: Pulmonary;  Laterality: Bilateral;  . Mediastinotomy chamberlain mcneil Left 03/24/2013   Procedure: MEDIASTINOTOMY CHAMBERLAIN MCNEIL;  Surgeon: Loreli Slot, MD;  Location: [facility] OR;  Service: Thoracic;  Laterality: Left;  . Excisional hemorrhoidectomy      > 30 yrs ago    Current Outpatient Prescriptions  Medication Sig Dispense Refill  . dexamethasone (DECADRON) 4 MG tablet Take 1 tablet (4 mg total) by mouth 2 (two) times daily with a meal. Take 8 mg 2 times daily with meals starting the day after chemotherapy for 3 days.  30 tablet  1  . glimepiride (AMARYL) 1 MG tablet Take 1 tablet (1 mg total) by mouth daily before breakfast.  30 tablet  3  . glucose monitoring kit (FREESTYLE) monitoring kit 1 each by Does not apply route as needed for other.  1 each  0  . hyaluronate sodium (RADIAPLEXRX) GEL Apply 1 application topically 2 (two) times daily.  14 g  0  . HYDROcodone-acetaminophen (NORCO) 5-325 MG per tablet Take 1 tablet by mouth every 6 (six) hours as needed for pain.  60 tablet  0  . LORazepam (ATIVAN) 0.5 MG tablet Take 1 tablet (0.5 mg total) by mouth every 6 (six) hours as needed. Take as needed for nausea and vomiting.  30 tablet  1  . metFORMIN (GLUCOPHAGE) 500 MG tablet 1 tab daily for 3 days, then 1 tab twice daily for 1 week. If tolerating well, increase to two tabs daily.  120 tablet  0  . Multiple Vitamin (MULTIVITAMIN WITH MINERALS) TABS Take 1 tablet by mouth daily.  30 tablet  3  . nicotine (NICODERM CQ) 14 mg/24hr patch Place 1 patch onto the skin daily.  28 patch  1  . nicotine (NICODERM CQ) 7  mg/24hr patch Place 1 patch onto the skin daily.  28 patch  0  . ondansetron (ZOFRAN) 8 MG tablet Take 1 tablet (8 mg total) by mouth every 12 (twelve) hours as needed for nausea. Take 1 tablet 2 times daily starting the day after chemotherapy.  Then take 1 tablet two times a day as needed for nausea and vomiting.  30 tablet  1  . paclitaxel (TAXOL) 30 MG/5ML injection Inject as directed once a week.      . prochlorperazine (COMPAZINE) 10 MG tablet Take 1 tablet  (10 mg total) by mouth every 6 (six) hours as needed. Take as needed for nausea and vomiting.  30 tablet  1  . prochlorperazine (COMPAZINE) 25 MG suppository Place 1 suppository (25 mg total) rectally every 12 (twelve) hours as needed for nausea.  12 suppository  3  . sucralfate (CARAFATE) 1 GM/10ML suspension Take 10 mLs (1 g total) by mouth 4 (four) times daily.  420 mL  0  . tamsulosin (FLOMAX) 0.4 MG CAPS Take 1 capsule (0.4 mg total) by mouth daily after supper.  30 capsule  3   No current facility-administered medications for this visit.    ALLERGIES:  has No Known Allergies.  REVIEW OF SYSTEMS:  The rest of the 14-point review of system was negative.   Filed Vitals:   06/06/13 0904  BP: 142/85  Temp: 86 F (30 C)  Resp: 20   Wt Readings from Last 3 Encounters:  05/31/13 214 lb 11.2 oz (97.387 kg)  05/30/13 216 lb 1.6 oz (98.022 kg)  05/24/13 213 lb 8 oz (96.843 kg)   ECOG Performance status: ECOG 1.   PHYSICAL EXAMINATION:  General: well-nourished man, in no acute distress. Eyes: no scleral icterus. ENT: There were no oropharyngeal lesions. Neck was without thyromegaly. Lymphatics: Negative cervical, supraclavicular or axillary adenopathy. Respiratory: lungs were clear bilaterally without wheezing or crackles. Cardiovascular: Regular rate and rhythm, S1/S2, without murmur, rub or gallop. There was no pedal edema. GI: abdomen was soft, flat, nontender, nondistended, without organomegaly. Muscoloskeletal: no spinal tenderness of palpation of vertebral spine. Skin exam was without echymosis, petichae. Neuro exam was nonfocal. Patient was able to get on and off exam table without assistance. Gait was normal. Patient was alerted and oriented. Attention was good. Language was appropriate. Mood was normal without depression. Speech was not pressured. Thought content was not tangential.    LABORATORY/RADIOLOGY DATA:  Lab Results  Component Value Date   WBC 3.1* 06/06/2013   HGB 11.3*  06/06/2013   HCT 35.9* 06/06/2013   PLT 151 06/06/2013   GLUCOSE 132* 05/30/2013   ALKPHOS 72 05/16/2013   ALT 18 05/16/2013   AST 11 05/16/2013   NA 139 05/30/2013   K 3.7 05/30/2013   CL 107 05/30/2013   CREATININE 0.9 05/30/2013   BUN 15.0 05/30/2013   CO2 21* 05/30/2013   INR 1.01 03/23/2013   HGBA1C 7.9* 05/17/2013     ASSESSMENT AND PLAN:    1.  Diagnosis:  Squamous cell carcinoma of the lung.  2.  Treatment:  Concurrent chemo radiation with daily XRT and weekly chemo Carboplatin/Taxol. He is doing relatively well with chemo.  He has grade 1 fatigue; leukopenia without neutropenia. His diabetic neuropathy predated chemo and has not worsened with chemo.  I recommended to proceed with chemo this week without dose modification.  If his neuropathy worsens significantly next week or he develops neutropenia, we may consider canceling next week.   3.  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 Due to urgency to treat the much bulkier lung cancer, this has been on hold.  When he finishes chemoradiation for lung cancer, I will refer him back to Dr. Pollyann Kennedy for direct laryngoscopy and possible biopsy.   4.   Follow up: In 2 months with a CT of the chest 1-2 days prior to the visit.  The length of time of the face-to-face encounter was 15 minutes. More than 50% of time was spent counseling and coordination of care.

## 2013-06-07 ENCOUNTER — Encounter: Payer: Self-pay | Admitting: Radiation Oncology

## 2013-06-07 ENCOUNTER — Ambulatory Visit
Admission: RE | Admit: 2013-06-07 | Discharge: 2013-06-07 | Disposition: A | Discharge status: Home / Self Care | Payer: Medicaid Other | Source: Ambulatory Visit | Attending: Radiation Oncology | Admitting: Radiation Oncology

## 2013-06-07 ENCOUNTER — Telehealth: Payer: Self-pay | Admitting: Oncology

## 2013-06-07 MED ORDER — SUCRALFATE 1 GM/10ML PO SUSP: 1.0000 g | Freq: Four times a day (QID) | ORAL | Status: DC

## 2013-06-07 NOTE — Progress Notes (Signed)
Pt completed tx today, has FU card. Pt denies pain, states he rarely has a pain in his abdomen but it goes away quickly. Pt states he has dry cough, SOB w/exertion, fatigue, denies loss of appetite. Pt applying Radiaplex to left chest and left upper back where he has dark pigmentation and one very small area of slightly moist desquamation. Advised he apply antibiotic ointment to the desquamated area and continue w/Radiaplex until completed. Pt inquired if he could then apply cocoa butter; informed him he may. Pt verbalized understanding.

## 2013-06-07 NOTE — Progress Notes (Signed)
[facility]    Radiation Oncology [address] [place]     Maryln Gottron, M.D. [place], [place] [address]               Billie Lade, M.D., Ph.D. Phone: [phone]      Molli Hazard A. Kathrynn Running, M.D. Fax: [phone]      Radene Gunning, M.D., Ph.D.         Lurline Hare, M.D.         Grayland Jack, M.D Weekly Treatment Management Note  Name: Rodney Powers     MRN: [id number]        CSN: [id number] Date: 06/07/2013      DOB: 09/23/1956  CC: No PCP Per Patient         [name]    Status: Outpatient  Diagnosis: The encounter diagnosis was Lung cancer, left.  Current Dose: 63 Gy  Current Fraction: 35  Planned Dose: 63 GY  Narrative: Rodney Powers was seen today for weekly treatment management. The chart was checked and CBCT  were reviewed. He is happy to complete his radiation therapy. He does have some esophageal symptoms. He is about to run out of his Carafate and I will refill this medication. Medical oncology is refilling the patient's pain medication.  Review of patient's allergies indicates no known allergies.  Current Outpatient Prescriptions  Medication Sig Dispense Refill  . dexamethasone (DECADRON) 4 MG tablet Take 1 tablet (4 mg total) by mouth 2 (two) times daily with a meal. Take 8 mg 2 times daily with meals starting the day after chemotherapy for 3 days.  30 tablet  1  . glimepiride (AMARYL) 1 MG tablet Take 1 tablet (1 mg total) by mouth daily before breakfast.  30 tablet  3  . glucose monitoring kit (FREESTYLE) monitoring kit 1 each by Does not apply route as needed for other.  1 each  0  . hyaluronate sodium (RADIAPLEXRX) GEL Apply 1 application topically 2 (two) times daily.  14 g  0  . HYDROcodone-acetaminophen (NORCO) 5-325 MG per tablet Take 1 tablet by mouth every 6 (six) hours as needed for pain.  60 tablet  0  . LORazepam (ATIVAN) 0.5 MG tablet Take 1 tablet (0.5 mg total) by mouth every 6 (six) hours as needed. Take as needed for  nausea and vomiting.  30 tablet  1  . metFORMIN (GLUCOPHAGE) 500 MG tablet 1 tab daily for 3 days, then 1 tab twice daily for 1 week. If tolerating well, increase to two tabs daily.  120 tablet  0  . Multiple Vitamin (MULTIVITAMIN WITH MINERALS) TABS Take 1 tablet by mouth daily.  30 tablet  3  . nicotine (NICODERM CQ) 14 mg/24hr patch Place 1 patch onto the skin daily.  28 patch  1  . nicotine (NICODERM CQ) 7 mg/24hr patch Place 1 patch onto the skin daily.  28 patch  0  . ondansetron (ZOFRAN) 8 MG tablet Take 1 tablet (8 mg total) by mouth every 12 (twelve) hours as needed for nausea. Take 1 tablet 2 times daily starting the day after chemotherapy.  Then take 1 tablet two times a day as needed for nausea and vomiting.  30 tablet  1  . paclitaxel (TAXOL) 30 MG/5ML injection Inject as directed once a week.      . prochlorperazine (COMPAZINE) 10 MG tablet Take 1 tablet (10 mg total) by mouth every 6 (six) hours as needed. Take as needed for  nausea and vomiting.  30 tablet  1  . prochlorperazine (COMPAZINE) 25 MG suppository Place 1 suppository (25 mg total) rectally every 12 (twelve) hours as needed for nausea.  12 suppository  3  . sucralfate (CARAFATE) 1 GM/10ML suspension Take 10 mLs (1 g total) by mouth 4 (four) times daily.  420 mL  0  . tamsulosin (FLOMAX) 0.4 MG CAPS Take 1 capsule (0.4 mg total) by mouth daily after supper.  30 capsule  3   No current facility-administered medications for this encounter.   Labs:  Lab Results  Component Value Date   WBC 3.1* 06/06/2013   HGB 11.3* 06/06/2013   HCT 35.9* 06/06/2013   MCV 80.1 06/06/2013   PLT 151 06/06/2013   Lab Results  Component Value Date   CREATININE 1.1 06/06/2013   BUN 11.6 06/06/2013   NA 137 06/06/2013   K 4.1 06/06/2013   CL 104 06/06/2013   CO2 19* 06/06/2013   Lab Results  Component Value Date   ALT 18 05/16/2013   AST 11 05/16/2013   BILITOT 0.39 05/16/2013    Physical Examination:  weight is 218 lb 4.8 oz (99.02 kg). His oral  temperature is 97.6 F (36.4 C). His blood pressure is 159/76 and his pulse is 83. His respiration is 20 and oxygen saturation is 97%.    Wt Readings from Last 3 Encounters:  06/07/13 218 lb 4.8 oz (99.02 kg)  05/31/13 214 lb 11.2 oz (97.387 kg)  05/30/13 216 lb 1.6 oz (98.022 kg)    The skin of the treatment area shows hyperpigmentation changes. As a small area of moist desquamation in the left upper back. The patient will place a Triple Antibiotic ointment this area. Lungs - Normal respiratory effort, chest expands symmetrically. Lungs are clear to auscultation, no crackles or wheezes.  Heart has regular rhythm and rate  Abdomen is soft and non tender with normal bowel sounds  Assessment:  Patient tolerating treatments well  Plan: Continue treatment per original radiation prescription

## 2013-06-13 ENCOUNTER — Encounter: Payer: Self-pay | Admitting: Radiation Oncology

## 2013-06-13 NOTE — Progress Notes (Signed)
  Radiation Oncology         [phone] ________________________________  Name: Rodney Powers MRN: [id number]  Date: 06/13/2013  DOB: [birth year]  End of Treatment Note  Diagnosis:    Stage III-B non-small cell lung cancer    Indication for treatment:  Definitive treatment along with radiosensitizing chemotherapy       Radiation treatment dates:   May 6 through June 24  Site/dose:   Left upper central chest 63 gray in 35 fractions  Beams/energy:   3-D conformal  Narrative: The patient tolerated radiation treatment relatively well.   He had minimal esophageal symptoms. He has a mild cough and some fatigue during the course of his treatments. His breathing remained stable  Plan: The patient has completed radiation treatment. The patient will return to radiation oncology clinic for routine followup in one month. I advised them to call or return sooner if they have any questions or concerns related to their recovery or treatment.  -----------------------------------  Billie Lade, PhD, MD

## 2013-06-14 ENCOUNTER — Other Ambulatory Visit: Payer: Self-pay | Admitting: Internal Medicine

## 2013-06-16 ENCOUNTER — Encounter (HOSPITAL_COMMUNITY): Payer: Self-pay | Admitting: Pharmacy Technician

## 2013-06-20 ENCOUNTER — Encounter: Payer: Medicaid Other | Attending: Family Medicine | Admitting: *Deleted

## 2013-06-20 ENCOUNTER — Encounter: Payer: Self-pay | Admitting: *Deleted

## 2013-06-20 DIAGNOSIS — E119 Type 2 diabetes mellitus without complications: Secondary | ICD-10-CM | POA: Insufficient documentation

## 2013-06-20 DIAGNOSIS — Z713 Dietary counseling and surveillance: Secondary | ICD-10-CM | POA: Insufficient documentation

## 2013-06-20 DIAGNOSIS — E118 Type 2 diabetes mellitus with unspecified complications: Secondary | ICD-10-CM

## 2013-06-20 NOTE — Progress Notes (Signed)
Medical Nutrition Therapy:  Appt start time: 1145 end time:  1245.  Assessment:  Patient visiting today with a recent diagnosis of type 2 diabetes. He was diagnosed 2 months ago. He also has a recent history of nonsmall cell lung cancer s/p chemoradiation. He had lost about 30 pounds, but has since regained 23 pounds with a good appetite. He had been on glimepiride, but was started on metformin in addition due to elevated blood glucose. He has no prior education, but has been limiting bread and sweets.   MEDICATIONS: Metformin, glimepiride, multivitamin   DIETARY INTAKE:   Usual eating pattern includes 3 meals and up to 3 snacks per day.  24-hr recall:  B ( AM): Cereal (Froasted Flakes) with banana, whole milk, sausage, Ensure Snk ( AM): Sometimes, crackers, cookies, soda  L ( PM): Varies, chicken/burger, fries, soda Snk ( PM): Sometime, same D ( PM): Chicken/Pot roast, fries/rice/potatoes, green beans/corn, Ensure Snk ( PM): Sometimes Beverages: Soda, water, Powerade  Usual physical activity: None  Estimated energy needs: 2400 calories 270 g carbohydrates 180 g protein 67 g fat  Progress Towards Goal(s):  In progress.   Nutritional Diagnosis:  NB-1.1 Food and nutrition-related knowledge deficit As related to newly diagnosed diabetes.  As evidenced by no prior education.    Intervention:  Nutrition counseling. We discussed basic carb counting, including foods with carbs, label reading, portion size, and meal planning.   Goals:  1. 4 carb servings at meals, 1 serving at snacks.  2. Monitor portion size of carb containing foods.  3. Read food label.  4. Limit sugar sweetened beverages.  5. Consider changing to Glucerna shakes vs. Ensure   Handouts given during visit include:  Living Well with Diabetes booklet  Yellow meal plan card  Monitoring/Evaluation:  Dietary intake, exercise, BG, and body weight prn.

## 2013-06-21 NOTE — H&P (Signed)
Assessment  Diabetes (250.00) (E11.9). Lung cancer (162.9) (C34.90). 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 Discussed  I reviewed the PET scan. There is increased signal along the right oropharynx where this lesion is identified. This is certainly suspicious for a second primary cancer. Recommend endoscopy with biopsy and if it is positive, then it should be amenable to treatment with radiation. I will discuss with Dr. Gaylyn Rong how to proceed with biopsy and how to time it with respect to his chemotherapy and radiation for his lung cancer. Reason For Visit  Rodney Powers is here today at the kind request of none for consultation and opinion to check  to see if cancer anywhere else. HPI  Getting ready to start his second round of chemotherapy in conjunction with radiation therapy for a lung cancer. He was referred here for possible abnormality in the pharynx. He denies any chronic throat symptoms such as pain or dysphasia. He is continuing efforts to quit smoking but he has quit drinking alcohol. Allergies  No Known Drug Allergies. Current Meds  Dexamethasone 4 MG Oral Tablet;; RPT LORazepam TABS;; RPT Prochlorperazine SUPP;; RPT Hydrocodone-Acetaminophen TABS;; RPT PACLitaxel 30 MG/5ML Intravenous Concentrate;; RPT Multivitamins TABS;; RPT Glimepiride TABS;; RPT Ondansetron HCl TABS;; RPT RadiaPlexRx GEL;; RPT Tamsulosin HCl CAPS;; RPT. Active Problems  Diabetes   (250.00) (E11.9) Lung cancer   (162.9) (C34.90). PSH  Oral Surgery Tooth Extraction. Family Hx  Family history of hypertension: Mother (V17.49) (Z83.49). Personal Hx  Alcohol use; 2 drinks day or fewer No caffeine use Smoker (305.1) (F17.200). ROS  Systemic: Feeling tired (fatigue).  No fever.  Night sweats  and recent weight loss. Head: No headache. Eyes: No eye symptoms. Otolaryngeal: No hearing loss, no earache, no tinnitus, and no purulent nasal discharge.  No nasal passage blockage (stuffiness), no snoring, no  sneezing, and no hoarseness.  Sore throat. Cardiovascular: No chest pain or discomfort  and no palpitations. Pulmonary: Dyspnea.  No cough.  Wheezing. Gastrointestinal: Dysphagia.  No heartburn.  Nausea  and abdominal pain.  No melena.  No diarrhea. Genitourinary: Dysuria. Endocrine: Muscle weakness. Musculoskeletal: No calf muscle cramps, no arthralgias, and no soft tissue swelling. Neurological: Dizziness.  No fainting.  Tingling  and numbness. Psychological: No anxiety  and no depression. Skin: No rash. 12 system ROS was obtained and reviewed on the Health Maintenance form dated today.  Positive responses are shown above.  If the symptom is not checked, the patient has denied it. Vital Signs   Recorded by Skolimowski,Sharon on 26 Apr 2013 01:51 PM BP:128/72,  Height: 75 in, Weight: 193 lb, BMI: 24.1 kg/m2,  BMI Calculated: 24.12 ,  BSA Calculated: 2.16. Physical Exam  APPEARANCE: Well developed, well nourished, in no acute distress.  Normal affect, in a pleasant mood.  Oriented to time, place and person. COMMUNICATION: Normal voice   HEAD & FACE:  No scars, lesions or masses of head and face, except for a 4-5 cm soft subcutaneous cystic mass in the right anterior temporal area.  Sinuses nontender to palpation.  Salivary glands without mass or tenderness.  Facial strength symmetric.  No facial lesion, scars, or mass. EYES: EOMI with normal primary gaze alignment. Visual acuity grossly intact.  PERRLA EXTERNAL EAR & NOSE: No scars, lesions or masses  EAC & TYMPANIC MEMBRANE:  EAC shows no obstructing lesions or debris and tympanic membranes are normal bilaterally with good movement to insufflation. GROSS HEARING: Normal  TMJ:  Nontender  INTRANASAL EXAM: No polyps or purulence.  NASOPHARYNX: Normal, without lesions. LIPS,  TEETH & GUMS: No lip lesions, normal dentition and normal gums. ORAL CAVITY/OROPHARYNX:  Oral mucosa moist without lesion or asymmetry of the palate, tongue, tonsil or  posterior pharynx. LARYNX (mirror exam):  No lesions of the epiglottis, false cord or TVC's and cords move well to phonation. HYPOPHARYNX (mirror exam): No lesions, except for a small slightly raised mass along the right lateral pharyngeal wall/tonsillar area, otherwise no asymmetry or pooling of secretions. NECK:  Supple without adenopathy or mass. THYROID:  Normal with no masses palpable.  NEUROLOGIC:  No gross CN deficits. No nystagmus noted.   LYMPHATIC:  No enlarged nodes palpable. Signature  Electronically signed by : Serena Colonel  M.D.; 04/26/2013 2:08 PM EST.

## 2013-06-24 ENCOUNTER — Encounter (HOSPITAL_COMMUNITY)
Admission: RE | Admit: 2013-06-24 | Discharge: 2013-06-24 | Disposition: A | Discharge status: Home / Self Care | Payer: Medicaid Other | Source: Ambulatory Visit | Attending: Otolaryngology | Admitting: Otolaryngology

## 2013-06-24 ENCOUNTER — Encounter: Payer: Self-pay | Admitting: Family Medicine

## 2013-06-24 ENCOUNTER — Encounter (HOSPITAL_COMMUNITY): Payer: Self-pay

## 2013-06-24 ENCOUNTER — Ambulatory Visit: Payer: Medicaid Other | Attending: Family Medicine | Admitting: Family Medicine

## 2013-06-24 VITALS — BP 131/83 | HR 110 | Temp 98.5°F | Resp 18 | Ht 75.0 in | Wt 221.0 lb

## 2013-06-24 DIAGNOSIS — E1149 Type 2 diabetes mellitus with other diabetic neurological complication: Secondary | ICD-10-CM

## 2013-06-24 DIAGNOSIS — J449 Chronic obstructive pulmonary disease, unspecified: Secondary | ICD-10-CM

## 2013-06-24 DIAGNOSIS — E1142 Type 2 diabetes mellitus with diabetic polyneuropathy: Secondary | ICD-10-CM

## 2013-06-24 DIAGNOSIS — R208 Other disturbances of skin sensation: Secondary | ICD-10-CM

## 2013-06-24 DIAGNOSIS — E119 Type 2 diabetes mellitus without complications: Secondary | ICD-10-CM

## 2013-06-24 DIAGNOSIS — R209 Unspecified disturbances of skin sensation: Secondary | ICD-10-CM

## 2013-06-24 DIAGNOSIS — E1140 Type 2 diabetes mellitus with diabetic neuropathy, unspecified: Secondary | ICD-10-CM

## 2013-06-24 HISTORY — DX: Essential (primary) hypertension: I10

## 2013-06-24 HISTORY — DX: Shortness of breath: R06.02

## 2013-06-24 HISTORY — DX: Pneumonia, unspecified organism: J18.9

## 2013-06-24 HISTORY — DX: Major depressive disorder, single episode, unspecified: F32.9

## 2013-06-24 HISTORY — DX: Nocturia: R35.1

## 2013-06-24 HISTORY — DX: Depression, unspecified: F32.A

## 2013-06-24 HISTORY — DX: Personal history of other diseases of the respiratory system: Z87.09

## 2013-06-24 HISTORY — DX: Anxiety disorder, unspecified: F41.9

## 2013-06-24 HISTORY — DX: Pain in unspecified joint: M25.50

## 2013-06-24 HISTORY — DX: Benign prostatic hyperplasia without lower urinary tract symptoms: N40.0

## 2013-06-24 HISTORY — DX: Frequency of micturition: R35.0

## 2013-06-24 LAB — BASIC METABOLIC PANEL
BUN: 7 mg/dL (ref 6–23)
Chloride: 106 mEq/L (ref 96–112)
GFR calc non Af Amer: >90 mL/min (ref >90)
Glucose, Bld: 95 mg/dL (ref 70–99)
Potassium: 3.4 mEq/L — ABNORMAL LOW (ref 3.5–5.1)
Sodium: 142 mEq/L (ref 135–145)

## 2013-06-24 LAB — CBC
HCT: 38.8 % — ABNORMAL LOW (ref 39.0–52.0)
Hemoglobin: 12.6 g/dL — ABNORMAL LOW (ref 13.0–17.0)
MCH: 26.8 pg (ref 26.0–34.0)
RBC: 4.7 MIL/uL (ref 4.22–5.81)

## 2013-06-24 MED ORDER — GLIMEPIRIDE 1 MG PO TABS: 1.0000 mg | ORAL_TABLET | Freq: Every day | ORAL | Status: DC

## 2013-06-24 MED ORDER — GABAPENTIN 100 MG PO CAPS: 100.0000 mg | ORAL_CAPSULE | Freq: Three times a day (TID) | ORAL | Status: DC

## 2013-06-24 MED ORDER — METFORMIN HCL ER 500 MG PO TB24: 1000.0000 mg | ORAL_TABLET | Freq: Two times a day (BID) | ORAL | Status: DC

## 2013-06-24 NOTE — Patient Instructions (Addendum)
Hypoglycemia (Low Blood Sugar) Hypoglycemia is when the glucose (sugar) in your blood is too low. Hypoglycemia can happen for many reasons. It can happen to people with or without diabetes. Hypoglycemia can develop quickly and can be a medical emergency.  CAUSES  Having hypoglycemia does not mean that you will develop diabetes. Different causes include:  Missed or delayed meals or not enough carbohydrates eaten.  Medication overdose. This could be by accident or deliberate. If by accident, your medication may need to be adjusted or changed.  Exercise or increased activity without adjustments in carbohydrates or medications.  A nerve disorder that affects body functions like your heart rate, blood pressure and digestion (autonomic neuropathy).  A condition where the stomach muscles do not function properly (gastroparesis). Therefore, medications may not absorb properly.  The inability to recognize the signs of hypoglycemia (hypoglycemic unawareness).  Absorption of insulin  may be altered.  Alcohol consumption.  Pregnancy/menstrual cycles/postpartum. This may be due to hormones.  Certain kinds of tumors. This is very rare. SYMPTOMS   Sweating.  Hunger.  Dizziness.  Blurred vision.  Drowsiness.  Weakness.  Headache.  Rapid heart beat.  Shakiness.  Nervousness. DIAGNOSIS  Diagnosis is made by monitoring blood glucose in one or all of the following ways:  Fingerstick blood glucose monitoring.  Laboratory results. TREATMENT  If you think your blood glucose is low:  Check your blood glucose, if possible. If it is less than 70 mg/dl, take one of the following:  3-4 glucose tablets.   cup juice (prefer clear like apple).   cup "regular" soda pop.  1 cup milk.  -1 tube of glucose gel.  5-6 hard candies.  Do not over treat because your blood glucose (sugar) will only go too high.  Wait 15 minutes and recheck your blood glucose. If it is still less than  70 mg/dl (or below your target range), repeat treatment.  Eat a snack if it is more than one hour until your next meal. Sometimes, your blood glucose may go so low that you are unable to treat yourself. You may need someone to help you. You may even pass out or be unable to swallow. This may require you to get an injection of glucagon, which raises the blood glucose. HOME CARE INSTRUCTIONS  Check blood glucose as recommended by your caregiver.  Take medication as prescribed by your caregiver.  Follow your meal plan. Do not skip meals. Eat on time.  If you are going to drink alcohol, drink it only with meals.  Check your blood glucose before driving.  Check your blood glucose before and after exercise. If you exercise longer or different than usual, be sure to check blood glucose more frequently.  Always carry treatment with you. Glucose tablets are the easiest to carry.  Always wear medical alert jewelry or carry some form of identification that states that you have diabetes. This will alert people that you have diabetes. If you have hypoglycemia, they will have a better idea on what to do. SEEK MEDICAL CARE IF:   You are having problems keeping your blood sugar at target range.  You are having frequent episodes of hypoglycemia.  You feel you might be having side effects from your medicines.  You have symptoms of an illness that is not improving after 3-4 days.  You notice a change in vision or a new problem with your vision. SEEK IMMEDIATE MEDICAL CARE IF:   You are a family member or friend of a   person whose blood glucose goes below 70 mg/dl and is accompanied by:  Confusion.  A change in mental status.  The inability to swallow.  Passing out. Document Released: 12/01/2005 Document Revised: 02/23/2012 Document Reviewed: 03/29/2012 Exit[employer] Patient Information 2014 [place], [place]. 1800 Calorie Diet for Diabetes Meal Planning The 1800 calorie diet is designed for eating  up to 1800 calories each day. Following this diet and making healthy meal choices can help improve overall health. This diet controls blood sugar (glucose) levels and can also help lower blood pressure and cholesterol. SERVING SIZES Measuring foods and serving sizes helps to make sure you are getting the right amount of food. The list below tells how big or small some common serving sizes are:  1 oz.........4 stacked dice.  3 oz........Marland KitchenDeck of cards.  1 tsp.......Marland KitchenTip of little finger.  1 tbs......Marland KitchenMarland KitchenThumb.  2 tbs.......Marland KitchenGolf ball.   cup......Marland KitchenHalf of a fist.  1 cup.......Marland KitchenA fist. GUIDELINES FOR CHOOSING FOODS The goal of this diet is to eat a variety of foods and limit calories to 1800 each day. This can be done by choosing foods that are low in calories and fat. The diet also suggests eating small amounts of food frequently. Doing this helps control your blood glucose levels so they do not get too high or too low. Each meal or snack may include a protein food source to help you feel more satisfied and to stabilize your blood glucose. Try to eat about the same amount of food around the same time each day. This includes weekend days, travel days, and days off work. Space your meals about 4 to 5 hours apart and add a snack between them if you wish.  For example, a daily food plan could include breakfast, a morning snack, lunch, dinner, and an evening snack. Healthy meals and snacks include whole grains, vegetables, fruits, lean meats, poultry, fish, and dairy products. As you plan your meals, select a variety of foods. Choose from the bread and starch, vegetable, fruit, dairy, and meat/protein groups. Examples of foods from each group and their suggested serving sizes are listed below. Use measuring cups and spoons to become familiar with what a healthy portion looks like. Bread and Starch Each serving equals 15 grams of carbohydrates.  1 slice bread.   bagel.   cup cold cereal  (unsweetened).   cup hot cereal or mashed potatoes.  1 small potato (size of a computer mouse).   cup cooked pasta or rice.   English muffin.  1 cup broth-based soup.  3 cups of popcorn.  4 to 6 whole-wheat crackers.   cup cooked beans, peas, or corn. Vegetable Each serving equals 5 grams of carbohydrates.   cup cooked vegetables.  1 cup raw vegetables.   cup tomato or vegetable juice. Fruit Each serving equals 15 grams of carbohydrates.  1 small apple or orange.  1 cup watermelon or strawberries.   cup applesauce (no sugar added).  2 tbs raisins.   banana.   cup canned fruit, packed in water, its own juice, or sweetened with a sugar substitute.   cup unsweetened fruit juice. Dairy Each serving equals 12 to 15 grams of carbohydrates.  1 cup fat-free milk.  6 oz artificially sweetened yogurt or plain yogurt.  1 cup low-fat buttermilk.  1 cup soy milk.  1 cup almond milk. Meat/Protein  1 large egg.  2 to 3 oz meat, poultry, or fish.   cup low-fat cottage cheese.  1 tbs peanut butter.  1 oz  low-fat cheese.   cup tuna in water.   cup tofu. Fat  1 tsp oil.  1 tsp trans-fat-free margarine.  1 tsp butter.  1 tsp mayonnaise.  2 tbs avocado.  1 tbs salad dressing.  1 tbs cream cheese.  2 tbs sour cream. SAMPLE 1800 CALORIE DIET PLAN Breakfast   cup unsweetened cereal (1 carb serving).  1 cup fat-free milk (1 carb serving).  1 slice whole-wheat toast (1 carb serving).   small banana (1 carb serving).  1 scrambled egg.  1 tsp trans-fat-free margarine. Lunch  Tuna sandwich.  2 slices whole-wheat bread (2 carb servings).   cup canned tuna in water, drained.  1 tbs reduced fat mayonnaise.  1 stalk celery, chopped.  2 slices tomato.  1 lettuce leaf.  1 cup carrot sticks.  24 to 30 seedless grapes (2 carb servings).  6 oz light yogurt (1 carb serving). Afternoon Snack  3 graham cracker squares (1  carb serving).  Fat-free milk, 1 cup (1 carb serving).  1 tbs peanut butter. Dinner  3 oz salmon, broiled with 1 tsp oil.  1 cup mashed potatoes (2 carb servings) with 1 tsp trans-fat-free margarine.  1 cup fresh or frozen green beans.  1 cup steamed asparagus.  1 cup fat-free milk (1 carb serving). Evening Snack  3 cups air-popped popcorn (1 carb serving).  2 tbs parmesan cheese sprinkled on top. MEAL PLAN Use this worksheet to help you make a daily meal plan based on the 1800 calorie diet suggestions. If you are using this plan to help you control your blood glucose, you may interchange carbohydrate-containing foods (dairy, starches, and fruits). Select a variety of fresh foods of varying colors and flavors. The total amount of carbohydrate in your meals or snacks is more important than making sure you include all of the food groups every time you eat. Choose from the following foods to build your day's meals:  8 Starches.  4 Vegetables.  3 Fruits.  2 Dairy.  6 to 7 oz Meat/Protein.  Up to 4 Fats. Your dietician can use this worksheet to help you decide how many servings and which types of foods are right for you. BREAKFAST Food Group and Servings / Food Choice Starch ________________________________________________________ Dairy _________________________________________________________ Fruit _________________________________________________________ Meat/Protein __________________________________________________ Fat ___________________________________________________________ LUNCH Food Group and Servings / Food Choice Starch ________________________________________________________ Meat/Protein __________________________________________________ Vegetable _____________________________________________________ Fruit _________________________________________________________ Dairy _________________________________________________________ Fat  ___________________________________________________________ Aura Fey Food Group and Servings / Food Choice Starch ________________________________________________________ Meat/Protein __________________________________________________ Fruit __________________________________________________________ Dairy _________________________________________________________ Laural Golden Food Group and Servings / Food Choice Starch _________________________________________________________ Meat/Protein ___________________________________________________ Dairy __________________________________________________________ Vegetable ______________________________________________________ Fruit ___________________________________________________________ Fat ____________________________________________________________ Lollie Sails Food Group and Servings / Food Choice Fruit __________________________________________________________ Meat/Protein ___________________________________________________ Dairy __________________________________________________________ Starch _________________________________________________________ DAILY TOTALS Starch ____________________________ Vegetable _________________________ Fruit _____________________________ Dairy _____________________________ Meat/Protein______________________ Fat _______________________________ Document Released: 06/23/2005 Document Revised: 02/23/2012 Document Reviewed: 10/17/2011 [employer] Patient Information 2014 [place] [employer]. Blood Sugar Monitoring, Adult GLUCOSE METERS FOR SELF-MONITORING OF BLOOD GLUCOSE  It is important to be able to correctly measure your blood sugar (glucose). You can use a blood glucose monitor (a small battery-operated device) to check your glucose level at any time. This allows you and your caregiver to monitor your diabetes and to determine how well your treatment plan is working. The process of monitoring your blood  glucose with a glucose meter is called self-monitoring of blood glucose (SMBG). When people with diabetes control their blood sugar, they have better health. To test for glucose with a typical  glucose meter, place the disposable strip in the meter. Then place a small sample of blood on the "test strip." The test strip is coated with chemicals that combine with glucose in blood. The meter measures how much glucose is present. The meter displays the glucose level as a number. Several new models can record and store a number of test results. Some models can connect to personal computers to store test results or print them out.  Newer meters are often easier to use than older models. Some meters allow you to get blood from places other than your fingertip. Some new models have automatic timing, error codes, signals, or barcode readers to help with proper adjustment (calibration). Some meters have a large display screen or spoken instructions for people with visual impairments.  INSTRUCTIONS FOR USING GLUCOSE METERS  Wash your hands with soap and warm water, or clean the area with alcohol. Dry your hands completely.  Prick the side of your fingertip with a lancet (a sharp-pointed tool used by hand).  Hold the hand down and gently milk the finger until a small drop of blood appears. Catch the blood with the test strip.  Follow the instructions for inserting the test strip and using the SMBG meter. Most meters require the meter to be turned on and the test strip to be inserted before applying the blood sample.  Record the test result.  Read the instructions carefully for both the meter and the test strips that go with it. Meter instructions are found in the user manual. Keep this manual to help you solve any problems that may arise. Many meters use "error codes" when there is a problem with the meter, the test strip, or the blood sample on the strip. You will need the manual to understand these error codes  and fix the problem.  New devices are available such as laser lancets and meters that can test blood taken from "alternative sites" of the body, other than fingertips. However, you should use standard fingertip testing if your glucose changes rapidly. Also, use standard testing if:  You have eaten, exercised, or taken insulin in the past 2 hours.  You think your glucose is low.  You tend to not feel symptoms of low blood glucose (hypoglycemia).  You are ill or under stress.  Clean the meter as directed by the manufacturer.  Test the meter for accuracy as directed by the manufacturer.  Take your meter with you to your caregiver's office. This way, you can test your glucose in front of your caregiver to make sure you are using the meter correctly. Your caregiver can also take a sample of blood to test using a routine lab method. If values on the glucose meter are close to the lab results, you and your caregiver will see that your meter is working well and you are using good technique. Your caregiver will advise you about what to do if the results do not match. FREQUENCY OF TESTING  Your caregiver will tell you how often you should check your blood glucose. This will depend on your type of diabetes, your current level of diabetes control, and your types of medicines. The following are general guidelines, but your care plan may be different. Record all your readings and the time of day you took them for review with your caregiver.   Diabetes type 1.  When you are using insulin with good diabetic control (either multiple daily injections or via a pump), you should check your glucose  4 times a day.  If your diabetes is not well controlled, you may need to monitor more frequently, including before meals and 2 hours after meals, at bedtime, and occasionally between 2 a.m. and 3 a.m.  You should always check your glucose before a dose of insulin or before changing the rate on your insulin  pump.  Diabetes type 2.  Guidelines for SMBG in diabetes type 2 are not as well defined.  If you are on insulin, follow the guidelines above.  If you are on medicines, but not insulin, and your glucose is not well controlled, you should test at least twice daily.  If you are not on insulin, and your diabetes is controlled with medicines or diet alone, you should test at least once daily, usually before breakfast.  A weekly profile will help your caregiver advise you on your care plan. The week before your visit, check your glucose before a meal and 2 hours after a meal at least daily. You may want to test before and after a different meal each day so you and your caregiver can tell how well controlled your blood sugars are throughout the course of a 24 hour period.  Gestational diabetes (diabetes during pregnancy).  Frequent testing is often necessary. Accurate timing is important.  If you are not on insulin, check your glucose 4 times a day. Check it before breakfast and 1 hour after the start of each meal.  If you are on insulin, check your glucose 6 times a day. Check it before each meal and 1 hour after the first bite of each meal.  General guidelines.  More frequent testing is required at the start of insulin treatment. Your caregiver will instruct you.  Test your glucose any time you suspect you have low blood sugar (hypoglycemia).  You should test more often when you change medicines, when you have unusual stress or illness, or in other unusual circumstances. OTHER THINGS TO KNOW ABOUT GLUCOSE METERS  Measurement Range. Most glucose meters are able to read glucose levels over a broad range of values from as low as 0 to as high as 600 mg/dL. If you get an extremely high or low reading from your meter, you should first confirm it with another reading. Report very high or very low readings to your caregiver.  Whole Blood Glucose versus Plasma Glucose. Some older home glucose  meters measure glucose in your whole blood. In a lab or when using some newer home glucose meters, the glucose is measured in your plasma (one component of blood). The difference can be important. It is important for you and your caregiver to know whether your meter gives its results as "whole blood equivalent" or "plasma equivalent."  Display of High and Low Glucose Values. Part of learning how to operate a meter is understanding what the meter results mean. Know how high and low glucose concentrations are displayed on your meter.  Factors that Affect Glucose Meter Performance. The accuracy of your test results depends on many factors and varies depending on the brand and type of meter. These factors include:  Low red blood cell count (anemia).  Substances in your blood (such as uric acid, vitamin C, and others).  Environmental factors (temperature, humidity, altitude).  Name-brand versus generic test strips.  Calibration. Make sure your meter is set up properly. It is a good idea to do a calibration test with a control solution recommended by the manufacturer of your meter whenever you begin using a  fresh bottle of test strips. This will help verify the accuracy of your meter.  Improperly stored, expired, or defective test strips. Keep your strips in a dry place with the lid on.  Soiled meter.  Inadequate blood sample. NEW TECHNOLOGIES FOR GLUCOSE TESTING Alternative site testing Some glucose meters allow testing blood from alternative sites. These include the:  Upper arm.  Forearm.  Base of the thumb.  Thigh. Sampling blood from alternative sites may be desirable. However, it may have some limitations. Blood in the fingertips show changes in glucose levels more quickly than blood in other parts of the body. This means that alternative site test results may be different from fingertip test results, not because of the meter's ability to test accurately, but because the actual glucose  concentration can be different.  Continuous Glucose Monitoring Devices to measure your blood glucose continuously are available, and others are in development. These methods can be more expensive than self-monitoring with a glucose meter. However, it is uncertain how effective and reliable these devices are. Your caregiver will advise you if this approach makes sense for you. IF BLOOD SUGARS ARE CONTROLLED, PEOPLE WITH DIABETES REMAIN HEALTHIER.  SMBG is an important part of the treatment plan of patients with diabetes mellitus. Below are reasons for using SMBG:   It confirms that your glucose is at a specific, healthy level.  It detects hypoglycemia and severe hyperglycemia.  It allows you and your caregiver to make adjustments in response to changes in lifestyle for individuals requiring medicine.  It determines the need for starting insulin therapy in temporary diabetes that happens during pregnancy (gestational diabetes). Document Released: 12/04/2003 Document Revised: 02/23/2012 Document Reviewed: 03/27/2011 [facility] Patient Information 2014 [place], [place]. Diabetes and Foot Care Diabetes may cause you to have a poor blood supply (circulation) to your legs and feet. Because of this, the skin may be thinner, break easier, and heal more slowly. You also may have nerve damage in your legs and feet causing decreased feeling. You may not notice minor injuries to your feet that could lead to serious problems or infections. Taking care of your feet is one of the most important things you can do for yourself.  HOME CARE INSTRUCTIONS  Do not go barefoot. Bare feet are easily injured.  Check your feet daily for blisters, cuts, and redness.  Wash your feet with warm water (not hot) and mild soap. Pat your feet and between your toes until completely dry.  Apply a moisturizing lotion that does not contain alcohol or petroleum jelly to the dry skin on your feet and to dry brittle toenails. Do not  put it between your toes.  Trim your toenails straight across. Do not dig under them or around the cuticle.  Do not cut corns or calluses, or try to remove them with medicine.  Wear clean cotton socks or stockings every day. Make sure they are not too tight. Do not wear knee high stockings since they may decrease blood flow to your legs.  Wear leather shoes that fit properly and have enough cushioning. To break in new shoes, wear them just a few hours a day to avoid injuring your feet.  Wear shoes at all times, even in the house.  Do not cross your legs. This may decrease the blood flow to your feet.  If you find a minor scrape, cut, or break in the skin on your feet, keep it and the skin around it clean and dry. These areas may be  cleansed with mild soap and water. Do not use peroxide, alcohol, iodine or Merthiolate.  When you remove an adhesive bandage, be sure not to harm the skin around it.  If you have a wound, look at it several times a day to make sure it is healing.  Do not use heating pads or hot water bottles. Burns can occur. If you have lost feeling in your feet or legs, you may not know it is happening until it is too late.  Report any cuts, sores or bruises to your caregiver. Do not wait! SEEK MEDICAL CARE IF:   You have an injury that is not healing or you notice redness, numbness, burning, or tingling.  Your feet always feel cold.  You have pain or cramps in your legs and feet. SEEK IMMEDIATE MEDICAL CARE IF:   There is increasing redness, swelling, or increasing pain in the wound.  There is a red line that goes up your leg.  Pus is coming from a wound.  You develop an unexplained oral temperature above 102 F (38.9 C), or as your caregiver suggests.  You notice a bad smell coming from an ulcer or wound. MAKE SURE YOU:   Understand these instructions.  Will watch your condition.  Will get help right away if you are not doing well or get worse. Document  Released: 11/28/2000 Document Revised: 02/23/2012 Document Reviewed: 06/06/2009 [facility] Patient Information 2014 [place], [place].

## 2013-06-24 NOTE — Progress Notes (Signed)
Pt doesn't have a cardiologist  Echo report in epic from 2007  Denies ever having a stress test/heart cath  Medical Md is at [facility]  EKG and CXR in epic from 03-23-13

## 2013-06-24 NOTE — Progress Notes (Signed)
Pt here for rx refill diabetes med and testing strips. Checked cbg @ 12pm-110.denies [email or web address].vss

## 2013-06-24 NOTE — Progress Notes (Signed)
Patient ID: Rodney Powers, male   DOB: Mar 17, 1956, 57 y.o.   MRN: [id number]   CC: follow up Diabetes   HPI: Patient reports that he has been monitoring his blood glucose.  He reports that he is having blood glucose readings in the 150-180 range.  The patient reports that he is having significant pain in his feet and legs.  He reports that he has allodynia.  He reports that it hurts for the sheets to touch his feet.  The patient reports that the skin on his legs has been cracking.  The patient reports that he's having some diarrhea with the metformin.  He is taking his medications.  Reports no binging of alcohol.  No Known Allergies Past Medical History  Diagnosis Date  . Back pain, chronic   . Mediastinal mass   . Urinary retention   . Lung cancer 04/07/2013  . Diabetic neuropathy   . Diabetes mellitus, type II     takes Gimepimide and Metformin daily  . Enlarged prostate     takes Flomax every evening  . Hypertension     slightly elevated recently;no meds;just watching for now  . COPD (chronic obstructive pulmonary disease)     emphysema  . Shortness of breath     with exertion  . Pneumonia     hx of;last time about 93yrs ago  . History of bronchitis     last time 62yrs ago  . Joint pain     stiffness and cramping in both hands  . Urinary frequency   . Nocturia   . Anemia     no meds and hasn't had a transfusion ever  . Anxiety   . Depression     no meds required;hospitatlized 84yrs ago for this   Current Outpatient Prescriptions on File Prior to Visit  Medication Sig Dispense Refill  . dexamethasone (DECADRON) 4 MG tablet Take 1 tablet (4 mg total) by mouth 2 (two) times daily with a meal. Take 8 mg 2 times daily with meals starting the day after chemotherapy for 3 days.  30 tablet  1  . glucose monitoring kit (FREESTYLE) monitoring kit 1 each by Does not apply route as needed for other.  1 each  0  . hyaluronate sodium (RADIAPLEXRX) GEL Apply 1 application topically 2  (two) times daily.  14 g  0  . HYDROcodone-acetaminophen (NORCO) 5-325 MG per tablet Take 1 tablet by mouth every 6 (six) hours as needed for pain.  60 tablet  0  . LORazepam (ATIVAN) 0.5 MG tablet Take 1 tablet (0.5 mg total) by mouth every 6 (six) hours as needed. Take as needed for nausea and vomiting.  30 tablet  1  . Multiple Vitamin (MULTIVITAMIN WITH MINERALS) TABS Take 1 tablet by mouth daily.      . nicotine (NICODERM CQ - DOSED IN MG/24 HR) 7 mg/24hr patch Place 1 patch onto the skin daily.      . sucralfate (CARAFATE) 1 GM/10ML suspension Take 10 mLs (1 g total) by mouth 4 (four) times daily.  420 mL  0  . tamsulosin (FLOMAX) 0.4 MG CAPS Take 1 capsule (0.4 mg total) by mouth daily after supper.  30 capsule  3   No current facility-administered medications on file prior to visit.   Family History  Problem Relation Age of Onset  . Aneurysm Mother   . Heart attack Father   . Aneurysm Sister   . Cancer Brother    History  Social History  . Marital Status: Legally Separated    Spouse Name: N/A    Number of Children: 3  . Years of Education: N/A   Occupational History  .      [employer]   Social History Main Topics  . Smoking status: Current Every Day Smoker -- 0.25 packs/day for 41 years    Types: Cigarettes    Start date: 07/18/1971  . Smokeless tobacco: Not on file     Comment: nicotene patch  . Alcohol Use: 0.0 oz/week     Comment: daily beer 2  . Drug Use: No     Comment: History of Marijuana use 30 years ago  . Sexually Active: No   Other Topics Concern  . Not on file   Social History Narrative  . No narrative on file    Review of Systems  Constitutional: Negative for fever, chills, diaphoresis, activity change, appetite change and fatigue.  HENT: Negative for ear pain, nosebleeds, congestion, facial swelling, rhinorrhea, neck pain, neck stiffness and ear discharge.   Eyes: Negative for pain, discharge, redness, itching and visual disturbance.   Respiratory: Negative for cough, choking, chest tightness, shortness of breath, wheezing and stridor.   Cardiovascular: Negative for chest pain, palpitations and leg swelling.  Gastrointestinal: Negative for abdominal distention.  Genitourinary: Negative for dysuria, urgency, frequency, hematuria, flank pain, decreased urine volume, difficulty urinating and dyspareunia.  Musculoskeletal: Negative for back pain, joint swelling, arthralgias and gait problem.  Neurological: Negative for dizziness, tremors, seizures, syncope, facial asymmetry, speech difficulty, weakness, light-headedness, numbness and headaches.  Hematological: Negative for adenopathy. Does not bruise/bleed easily.  Psychiatric/Behavioral: Negative for hallucinations, behavioral problems, confusion, dysphoric mood, decreased concentration and agitation.    Objective:   Filed Vitals:   06/24/13 1713  BP: 131/83  Pulse: 110  Temp: 98.5 F (36.9 C)  Resp: 18    Physical Exam  Constitutional: Appears well-developed and well-nourished. No distress.  HENT: Normocephalic. External right and left ear normal. Oropharynx is clear and moist.  Eyes: Conjunctivae and EOM are normal. PERRLA, no scleral icterus.  Neck: Normal ROM. Neck supple. No JVD. No tracheal deviation. No thyromegaly.  CVS: RRR, S1/S2 +, no murmurs, no gallops, no carotid bruit.  Pulmonary: Effort and breath sounds normal, no stridor, rhonchi, wheezes, rales.  Abdominal: Soft. BS +,  no distension, tenderness, rebound or guarding.  Musculoskeletal: Normal range of motion. No edema and no tenderness.  Lymphadenopathy: No lymphadenopathy noted, cervical, inguinal. Neuro: Alert. Normal reflexes, muscle tone coordination. No cranial nerve deficit. Skin: Skin is warm and dry. No rash noted. Not diaphoretic. No erythema. No pallor.  Psychiatric: Normal mood and affect. Behavior, judgment, thought content normal.   Lab Results  Component Value Date   WBC 2.7*  06/24/2013   HGB 12.6* 06/24/2013   HCT 38.8* 06/24/2013   MCV 82.6 06/24/2013   PLT 171 06/24/2013   Lab Results  Component Value Date   CREATININE 0.97 06/24/2013   BUN 7 06/24/2013   NA 142 06/24/2013   K 3.4* 06/24/2013   CL 106 06/24/2013   CO2 23 06/24/2013    Lab Results  Component Value Date   HGBA1C 7.9* 05/17/2013   Lipid Panel  No results found for this basename: chol, trig, hdl, cholhdl, vldl, ldlcalc     Assessment and plan:   Patient Active Problem List   Diagnosis Date Noted  . Diabetic neuropathy, painful 06/24/2013  . Allodynia 06/24/2013  . Type II or unspecified type diabetes  mellitus without mention of complication, not stated as uncontrolled 06/24/2013  . Mass of lung 06/06/2013  . COPD (chronic obstructive pulmonary disease) 05/02/2013  . Lung cancer 04/07/2013  . PNA (pneumonia) 03/06/2013  . Hyponatremia 03/06/2013  . AKI (acute kidney injury) 03/06/2013  . Protein-calorie malnutrition, moderate 03/06/2013  . Hyperglycemia 03/06/2013  . Leukocytosis 03/06/2013  . ETOH abuse 03/06/2013   Trial of gabapentin 100 mg po TID for neuropathy pain  Foot protection advised Please see Diabetic foot exam note  Increase metformin to 1000 mg po BID  Continue amaryl 1 mg daily  Hypoglycemia precautions recommended  RTC in 3 months  The patient was given clear instructions to go to ER or return to medical center if symptoms don't improve, worsen or new problems develop.  The patient verbalized understanding.  The patient was told to call to get any lab results if not heard anything in the next week.    Rodney Langton, MD, CDE, FAAFP [facility] [place], [place]

## 2013-06-24 NOTE — Pre-Procedure Instructions (Signed)
Rodney Powers  06/24/2013   Your procedure is scheduled on:  Fri, July 18 @ 7:30 AM  Report to [facility] at 5:30 AM.  Call this number if you have problems the morning of surgery: [phone]   Remember:   Do not eat food or drink liquids after midnight.   Take these medicines the morning of surgery with A SIP OF WATER: Pain Pill(if needed) and Lorazepam(Ativan)              No Aspirin,Goody's,BC's.Aleve,Ibuprofen,Fish Oil,or any Herbal Medications   Do not wear jewelry  Do not wear lotions, powders, or colognes. You may wear deodorant.  Men may shave face and neck.  Do not bring valuables to the hospital.  [facility] is not responsible                   for any belongings or valuables.  Contacts, dentures or bridgework may not be worn into surgery.  Leave suitcase in the car. After surgery it may be brought to your room.  For patients admitted to the hospital, checkout time is 11:00 AM the day of  discharge.   Patients discharged the day of surgery will not be allowed to drive  home.    Special Instructions: Shower using CHG 2 nights before surgery and the night before surgery.  If you shower the day of surgery use CHG.  Use special wash - you have one bottle of CHG for all showers.  You should use approximately 1/3 of the bottle for each shower.   Please read over the following fact sheets that you were given: Pain Booklet, Coughing and Deep Breathing and Surgical Site Infection Prevention

## 2013-06-24 NOTE — Progress Notes (Signed)
06/24/13 1014  OBSTRUCTIVE SLEEP APNEA  Have you ever been diagnosed with sleep apnea through a sleep study? No  Do you snore loudly (loud enough to be heard through closed doors)?  0  Do you often feel tired, fatigued, or sleepy during the daytime? 0  Has anyone observed you stop breathing during your sleep? 0  Do you have, or are you being treated for high blood pressure? 1  BMI more than 35 kg/m2? 0  Age over 56 years old? 1  Neck circumference greater than 40 cm/18 inches? 1 (18)  Gender: 1  Obstructive Sleep Apnea Score 4  Score 4 or greater  Results sent to PCP

## 2013-06-28 ENCOUNTER — Other Ambulatory Visit: Payer: Self-pay | Admitting: Family Medicine

## 2013-06-28 DIAGNOSIS — G4710 Hypersomnia, unspecified: Secondary | ICD-10-CM

## 2013-06-28 NOTE — Progress Notes (Signed)
I received a message that the patient screened Rodney Powers for possible obstructive sleep apnea.  This was for a presurgical consultation.  Will order for the patient have a sleep study evaluation done.  Rodney Langton, MD, CDE, FAAFP [facility] [place], [place]

## 2013-07-01 ENCOUNTER — Encounter (HOSPITAL_COMMUNITY): Payer: Self-pay | Admitting: Anesthesiology

## 2013-07-01 ENCOUNTER — Encounter (HOSPITAL_COMMUNITY): Payer: Self-pay | Admitting: *Deleted

## 2013-07-01 ENCOUNTER — Encounter (HOSPITAL_COMMUNITY)
Admission: RE | Disposition: A | Discharge status: Home / Self Care | Payer: Self-pay | Source: Ambulatory Visit | Attending: Otolaryngology

## 2013-07-01 ENCOUNTER — Ambulatory Visit (HOSPITAL_COMMUNITY): Payer: Medicaid Other | Admitting: Anesthesiology

## 2013-07-01 ENCOUNTER — Ambulatory Visit (HOSPITAL_COMMUNITY)
Admission: RE | Admit: 2013-07-01 | Discharge: 2013-07-01 | Disposition: A | Discharge status: Home / Self Care | Payer: Medicaid Other | Source: Ambulatory Visit | Attending: Otolaryngology | Admitting: Otolaryngology

## 2013-07-01 DIAGNOSIS — F17200 Nicotine dependence, unspecified, uncomplicated: Secondary | ICD-10-CM | POA: Insufficient documentation

## 2013-07-01 DIAGNOSIS — C3490 Malignant neoplasm of unspecified part of unspecified bronchus or lung: Secondary | ICD-10-CM | POA: Insufficient documentation

## 2013-07-01 DIAGNOSIS — D109 Benign neoplasm of pharynx, unspecified: Secondary | ICD-10-CM | POA: Insufficient documentation

## 2013-07-01 DIAGNOSIS — E119 Type 2 diabetes mellitus without complications: Secondary | ICD-10-CM | POA: Insufficient documentation

## 2013-07-01 DIAGNOSIS — Z79899 Other long term (current) drug therapy: Secondary | ICD-10-CM | POA: Insufficient documentation

## 2013-07-01 DIAGNOSIS — J392 Other diseases of pharynx: Secondary | ICD-10-CM

## 2013-07-01 HISTORY — PX: ESOPHAGOSCOPY: SHX5534

## 2013-07-01 HISTORY — PX: DIRECT LARYNGOSCOPY: SHX5326

## 2013-07-01 LAB — GLUCOSE, CAPILLARY
Glucose-Capillary: 159 mg/dL — ABNORMAL HIGH (ref 70–99)
Glucose-Capillary: 170 mg/dL — ABNORMAL HIGH (ref 70–99)

## 2013-07-01 SURGERY — LARYNGOSCOPY, DIRECT
Anesthesia: General | Site: Throat | Wound class: Clean Contaminated

## 2013-07-01 MED ORDER — ROCURONIUM BROMIDE 100 MG/10ML IV SOLN
INTRAVENOUS | Status: DC | PRN
  Administered 2013-07-01: 50 mg via INTRAVENOUS

## 2013-07-01 MED ORDER — LIDOCAINE-EPINEPHRINE 1 %-1:100000 IJ SOLN
INTRAMUSCULAR | Status: AC
  Filled 2013-07-01: qty 1

## 2013-07-01 MED ORDER — METOPROLOL TARTRATE 1 MG/ML IV SOLN
INTRAVENOUS | Status: DC | PRN
  Administered 2013-07-01: 1 mg via INTRAVENOUS

## 2013-07-01 MED ORDER — PHENYLEPHRINE HCL 10 MG/ML IJ SOLN
INTRAMUSCULAR | Status: DC | PRN
  Administered 2013-07-01 (×2): 40 ug via INTRAVENOUS
  Administered 2013-07-01: 80 ug via INTRAVENOUS
  Administered 2013-07-01: 40 ug via INTRAVENOUS

## 2013-07-01 MED ORDER — LACTATED RINGERS IV SOLN
INTRAVENOUS | Status: DC | PRN
  Administered 2013-07-01: 08:00:00 via INTRAVENOUS

## 2013-07-01 MED ORDER — ONDANSETRON HCL 4 MG/2ML IJ SOLN
INTRAMUSCULAR | Status: DC | PRN
  Administered 2013-07-01: 4 mg via INTRAVENOUS

## 2013-07-01 MED ORDER — LIDOCAINE HCL (CARDIAC) 20 MG/ML IV SOLN
INTRAVENOUS | Status: DC | PRN
  Administered 2013-07-01: 40 mg via INTRAVENOUS

## 2013-07-01 MED ORDER — GLYCOPYRROLATE 0.2 MG/ML IJ SOLN
INTRAMUSCULAR | Status: DC | PRN
  Administered 2013-07-01: 0.6 mg via INTRAVENOUS

## 2013-07-01 MED ORDER — HYDROMORPHONE HCL PF 1 MG/ML IJ SOLN: 0.2500 mg | INTRAMUSCULAR | Status: DC | PRN

## 2013-07-01 MED ORDER — PROPOFOL 10 MG/ML IV BOLUS
INTRAVENOUS | Status: DC | PRN
  Administered 2013-07-01: 200 mg via INTRAVENOUS

## 2013-07-01 MED ORDER — ACETAMINOPHEN 10 MG/ML IV SOLN: 1000.0000 mg | Freq: Once | INTRAVENOUS | Status: DC | PRN

## 2013-07-01 MED ORDER — EPINEPHRINE HCL (NASAL) 0.1 % NA SOLN
NASAL | Status: DC | PRN
  Administered 2013-07-01: 1 [drp]

## 2013-07-01 MED ORDER — EPINEPHRINE HCL (NASAL) 0.1 % NA SOLN
NASAL | Status: AC
  Filled 2013-07-01: qty 30

## 2013-07-01 MED ORDER — ONDANSETRON HCL 4 MG/2ML IJ SOLN: 4.0000 mg | Freq: Once | INTRAMUSCULAR | Status: DC | PRN

## 2013-07-01 MED ORDER — ACETAMINOPHEN 325 MG PO TABS
ORAL_TABLET | ORAL | Status: AC
  Administered 2013-07-01: 650 mg
  Filled 2013-07-01: qty 2

## 2013-07-01 MED ORDER — NEOSTIGMINE METHYLSULFATE 1 MG/ML IJ SOLN
INTRAMUSCULAR | Status: DC | PRN
  Administered 2013-07-01: 4 mg via INTRAVENOUS

## 2013-07-01 MED ORDER — FENTANYL CITRATE 0.05 MG/ML IJ SOLN
INTRAMUSCULAR | Status: DC | PRN
  Administered 2013-07-01: 150 ug via INTRAVENOUS
  Administered 2013-07-01: 50 ug via INTRAVENOUS

## 2013-07-01 MED ORDER — MIDAZOLAM HCL 5 MG/5ML IJ SOLN
INTRAMUSCULAR | Status: DC | PRN
  Administered 2013-07-01: 2 mg via INTRAVENOUS

## 2013-07-01 SURGICAL SUPPLY — 23 items
BALLN PULM 15 16.5 18X75 (BALLOONS)
BALLOON PULM 15 16.5 18X75 (BALLOONS) IMPLANT
CANISTER SUCTION 2500CC (MISCELLANEOUS) ×2 IMPLANT
CLOTH BEACON ORANGE TIMEOUT ST (SAFETY) ×2 IMPLANT
COVER MAYO STAND STRL (DRAPES) ×2 IMPLANT
COVER TABLE BACK 60X90 (DRAPES) ×2 IMPLANT
DRAPE PROXIMA HALF (DRAPES) ×2 IMPLANT
GLOVE ECLIPSE 7.5 STRL STRAW (GLOVE) ×2 IMPLANT
GUARD TEETH (MISCELLANEOUS) ×2 IMPLANT
KIT BASIN OR (CUSTOM PROCEDURE TRAY) ×2 IMPLANT
KIT ROOM TURNOVER OR (KITS) ×2 IMPLANT
MARKER SKIN DUAL TIP RULER LAB (MISCELLANEOUS) IMPLANT
NS IRRIG 1000ML POUR BTL (IV SOLUTION) ×2 IMPLANT
PAD ARMBOARD 7.5X6 YLW CONV (MISCELLANEOUS) ×4 IMPLANT
PATTIES SURGICAL .5 X3 (DISPOSABLE) ×2 IMPLANT
SPECIMEN JAR SMALL (MISCELLANEOUS) IMPLANT
SPONGE GAUZE 4X4 12PLY (GAUZE/BANDAGES/DRESSINGS) ×2 IMPLANT
SYR CONTROL 10ML LL (SYRINGE) IMPLANT
SYR INFLATE BILIARY GAUGE (MISCELLANEOUS) IMPLANT
SYR TB 1ML LUER SLIP (SYRINGE) IMPLANT
TOWEL OR 17X24 6PK STRL BLUE (TOWEL DISPOSABLE) ×2 IMPLANT
TUBE CONNECTING 12X1/4 (SUCTIONS) ×2 IMPLANT
WATER STERILE IRR 1000ML POUR (IV SOLUTION) IMPLANT

## 2013-07-01 NOTE — Anesthesia Preprocedure Evaluation (Signed)
Anesthesia Evaluation  Patient identified by MRN, date of birth, ID band Patient awake    Reviewed: Allergy & Precautions, H&P , NPO status , Patient's Chart, lab work & pertinent test results  Airway Mallampati: II      Dental  (+) Partial Upper, Partial Lower and Loose,    Pulmonary  breath sounds clear to auscultation        Cardiovascular Rhythm:Regular Rate:Tachycardia     Neuro/Psych    GI/Hepatic   Endo/Other    Renal/GU      Musculoskeletal   Abdominal   Peds  Hematology   Anesthesia Other Findings   Reproductive/Obstetrics                           Anesthesia Physical Anesthesia Plan  ASA: III  Anesthesia Plan: General   Post-op Pain Management:    Induction: Intravenous  Airway Management Planned: Oral ETT  Additional Equipment:   Intra-op Plan:   Post-operative Plan: Extubation in OR  Informed Consent: I have reviewed the patients History and Physical, chart, labs and discussed the procedure including the risks, benefits and alternatives for the proposed anesthesia with the patient or authorized representative who has indicated his/her understanding and acceptance.   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

## 2013-07-01 NOTE — Anesthesia Postprocedure Evaluation (Signed)
  Anesthesia Post-op Note  Patient: Rodney Powers  Procedure(s) Performed: Procedure(s): DIRECT LARYNGOSCOPY (N/A) ESOPHAGOSCOPY WITH BIOPSY (N/A)  Patient Location: PACU  Anesthesia Type:General  Level of Consciousness: awake, alert  and oriented  Airway and Oxygen Therapy: Patient Spontanous Breathing and Patient connected to nasal cannula oxygen  Post-op Pain: none  Post-op Assessment: Post-op Vital signs reviewed, Patient's Cardiovascular Status Stable, Respiratory Function Stable, Patent Airway and Pain level controlled  Post-op Vital Signs: stable  Complications: No apparent anesthesia complications

## 2013-07-01 NOTE — Interval H&P Note (Signed)
History and Physical Interval Note:  07/01/2013 7:22 AM  Rodney Powers  has presented today for surgery, with the diagnosis of Laryngeal Ca  The various methods of treatment have been discussed with the patient and family. After consideration of risks, benefits and other options for treatment, the patient has consented to  Procedure(s): DIRECT LARYNGOSCOPY (N/A) ESOPHAGOSCOPY WITH BIOPSY (N/A) as a surgical intervention .  The patient's history has been reviewed, patient examined, no change in status, stable for surgery.  I have reviewed the patient's chart and labs.  Questions were answered to the patient's satisfaction.     Rodney Powers

## 2013-07-01 NOTE — Preoperative (Signed)
Beta Blockers   Reason not to administer Beta Blockers:Not Applicable 

## 2013-07-01 NOTE — Op Note (Signed)
OPERATIVE REPORT  DATE OF SURGERY: 07/01/2013  PATIENT:  Rodney Powers,  57 y.o. male  PRE-OPERATIVE DIAGNOSIS:  PHARYNGEAL CANCER  POST-OPERATIVE DIAGNOSIS:  PHARYNGEAL CANCER  PROCEDURE:  Procedure(s): DIRECT LARYNGOSCOPY WITH BIOPSY ESOPHAGOSCOPY   SURGEON:  Susy Frizzle, MD  ASSISTANTS: None  ANESTHESIA:   General   EBL:  10 ml  DRAINS: None   LOCAL MEDICATIONS USED:  None  SPECIMEN:  Right lateral pharyngeal mass biopsy  COUNTS:  Correct  PROCEDURE DETAILS: The patient was taken to the operating room and placed on the operating table in the supine position. He was tachycardic in the preop area but his lungs were clear. Following induction of general endotracheal anesthesia the table was turned 90 and a head drape was applied. During the procedure he developed fever up to 101. There were no other concerns during the procedure.  #1 esophagoscopy. A rigid cervical esophagoscope was passed into the oral cavity through the cricopharyngeal sphincter and into the upper esophagus. Secretions were suctioned while the scope was slowly withdrawn inspecting the wall of the esophagus. There are no mucosal masses identified. There is diffuse esophagitis. No biopsies were taken. Scope was removed.  #2 direct laryngoscopy with biopsy. The Jako laryngoscope was used to perform upper endoscopy thoroughly evaluating the oropharynx, hypopharynx and larynx. Secretions were clear. There were no lesions seen within the endolarynx or hypopharynx but there was a raised 2 cm irregular mass along the right lateral pharyngeal wall. 2 large biopsies were taken from this. Topical adrenaline was applied for hemostasis. The scope was then removed. Patient was awakened, extubated and transferred to recovery in stable condition.    PATIENT DISPOSITION:  To PACU, stable

## 2013-07-01 NOTE — Transfer of Care (Signed)
Immediate Anesthesia Transfer of Care Note  Patient: Rodney Powers  Procedure(s) Performed: Procedure(s): DIRECT LARYNGOSCOPY (N/A) ESOPHAGOSCOPY WITH BIOPSY (N/A)  Patient Location: PACU  Anesthesia Type:General  Level of Consciousness: awake, alert  and oriented  Airway & Oxygen Therapy: Patient Spontanous Breathing and Patient connected to face mask oxygen  Post-op Assessment: Report given to PACU RN and Post -op Vital signs reviewed and stable  Post vital signs: Reviewed and stable  Complications: No apparent anesthesia complications

## 2013-07-04 ENCOUNTER — Encounter (HOSPITAL_COMMUNITY): Payer: Self-pay | Admitting: Otolaryngology

## 2013-07-05 ENCOUNTER — Encounter: Payer: Self-pay | Admitting: Radiation Oncology

## 2013-07-05 DIAGNOSIS — Z923 Personal history of irradiation: Secondary | ICD-10-CM | POA: Insufficient documentation

## 2013-07-07 ENCOUNTER — Ambulatory Visit: Payer: Medicaid Other | Admitting: Radiation Oncology

## 2013-07-11 ENCOUNTER — Ambulatory Visit
Admission: RE | Admit: 2013-07-11 | Discharge: 2013-07-11 | Disposition: A | Discharge status: Home / Self Care | Payer: Medicaid Other | Source: Ambulatory Visit | Attending: Radiation Oncology | Admitting: Radiation Oncology

## 2013-07-11 ENCOUNTER — Encounter: Payer: Self-pay | Admitting: Radiation Oncology

## 2013-07-11 NOTE — Progress Notes (Signed)
  Radiation Oncology         [phone] ________________________________  Name: Rodney Powers MRN: [id number]  Date: 07/11/2013  DOB: 04-02-1956  Follow-Up Visit Note  CC: Acey Lav, MD  Kerin Perna, MD  Diagnosis:   Stage IIIB non-small cell lung  Interval Since Last Radiation:  1  months  Narrative:  The patient returns today for routine follow-up.  He is doing recently well except for neuropathy. Patient has started gabapentin for this issue and has noticed some increased drowsiness with this medication. He denies any pain with swallowing or difficulty swallowing at this time. He denies any breathing problems.                              ALLERGIES:  has No Known Allergies.  Meds: Current Outpatient Prescriptions  Medication Sig Dispense Refill  . gabapentin (NEURONTIN) 100 MG capsule Take 1 capsule (100 mg total) by mouth 3 (three) times daily.  90 capsule  1  . glimepiride (AMARYL) 1 MG tablet Take 1 tablet (1 mg total) by mouth daily before breakfast.  30 tablet  3  . glucose monitoring kit (FREESTYLE) monitoring kit 1 each by Does not apply route as needed for other.  1 each  0  . hyaluronate sodium (RADIAPLEXRX) GEL Apply 1 application topically 2 (two) times daily.  14 g  0  . HYDROcodone-acetaminophen (NORCO) 5-325 MG per tablet Take 1 tablet by mouth every 6 (six) hours as needed for pain.  60 tablet  0  . metFORMIN (GLUCOPHAGE XR) 500 MG 24 hr tablet Take 2 tablets (1,000 mg total) by mouth 2 (two) times daily with a meal.  120 tablet  3  . Multiple Vitamin (MULTIVITAMIN WITH MINERALS) TABS Take 1 tablet by mouth daily.      . nicotine (NICODERM CQ - DOSED IN MG/24 HR) 7 mg/24hr patch Place 1 patch onto the skin daily.      . sucralfate (CARAFATE) 1 GM/10ML suspension Take 10 mLs (1 g total) by mouth 4 (four) times daily.  420 mL  0   No current facility-administered medications for this encounter.    Physical Findings: The patient is in no acute  distress. Patient is alert and oriented.  height is 6\' 3"  (1.905 m) and weight is 217 lb 11.2 oz (98.748 kg). His temperature is 97.4 F (36.3 C). His blood pressure is 131/82 and his pulse is 102. His oxygen saturation is 97%. . No palpable supraclavicular or axillary adenopathy. The lungs are clear to auscultation. The heart has a regular rhythm and rate. Examination of the back area reveals some hyperpigmentation changes. The patient's skin is well healed.  Lab Findings: Lab Results  Component Value Date   WBC 2.7* 06/24/2013   HGB 12.6* 06/24/2013   HCT 38.8* 06/24/2013   MCV 82.6 06/24/2013   PLT 171 06/24/2013      Radiographic Findings: No results found.  Impression:  The patient is recovering from the effects of radiation.  No clinical evidence of recurrence on exam today.  Plan:  Routine followup in 4 months. Patient is scheduled for a CT scan of the chest in late August with followup in medical oncology at that time.  _____________________________________  -----------------------------------  Billie Lade, PhD, MD

## 2013-07-11 NOTE — Progress Notes (Signed)
Rodney Powers here today for assessment s/p radiation to the left upper, central chest which completed on 06/07/13.  Marland Kitchen  He c/o feeling sleepier today, but is now taking Gabapentin daily for his neuropathy x 2 weeks with resulting drowsiness.  He states that his appetite is decreased and has lost 4 lbs since 03/31/13, but denies any difficulty swallowing.  He denies any pain in the chest area, but c/o tingling and numbness of his feet.

## 2013-07-15 ENCOUNTER — Telehealth: Payer: Self-pay | Admitting: Hematology and Oncology

## 2013-07-15 NOTE — Telephone Encounter (Signed)
S/w relative confirming appt for 8/25 @ 1:30pm. Also confirmed lb/ct 8/22 @ 10am.

## 2013-07-26 ENCOUNTER — Encounter (HOSPITAL_COMMUNITY): Payer: Self-pay | Admitting: Pharmacy Technician

## 2013-07-27 ENCOUNTER — Encounter (HOSPITAL_COMMUNITY): Payer: Self-pay | Admitting: *Deleted

## 2013-07-27 NOTE — H&P (Signed)
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 Lung cancer (162.9) (C34.90). Discussed  Just finished treatment for lung cancer. Complaining of right-sided sore throat. On exam, using the mirror, there is an exophytic lesion along the right lateral pharyngeal wall. The rest of the pharynx and larynx appears to be clear. We will schedule him for endoscopy under anesthesia with biopsies in the near future. Reason For Visit  Discuss surgery. Allergies  No Known Drug Allergies. Current Meds  Dexamethasone 4 MG Oral Tablet;; RPT Glimepiride TABS;; RPT Hydrocodone-Acetaminophen TABS;; RPT LORazepam TABS;; RPT Multivitamins TABS;; RPT Ondansetron HCl TABS;; RPT PACLitaxel 30 MG/5ML Intravenous Concentrate;; RPT Prochlorperazine SUPP;; RPT Tamsulosin HCl CAPS;; RPT RadiaPlexRx GEL;; RPT. Active Problems  Diabetes   (250.00) (E11.9) Lung cancer   (162.9) (C34.90) Oropharynx neoplasm   (239.0) (D37.9). PSH  Oral Surgery Tooth Extraction. Family Hx  Family history of hypertension: Mother (V17.49) (Z82.49). Personal Hx  Alcohol use; 2 drinks day or fewer No caffeine use Smoker (305.1) (Z72.0). Signature  Electronically signed by : Ayisha Pol  M.D.; 06/09/2013 1:26 PM EST.  

## 2013-07-28 ENCOUNTER — Ambulatory Visit (HOSPITAL_COMMUNITY): Admission: RE | Admit: 2013-07-28 | Payer: Medicaid Other | Source: Ambulatory Visit | Admitting: Otolaryngology

## 2013-07-28 HISTORY — DX: Diarrhea, unspecified: R19.7

## 2013-07-28 HISTORY — DX: Polyneuropathy, unspecified: G62.9

## 2013-07-28 HISTORY — DX: Cardiac murmur, unspecified: R01.1

## 2013-07-28 SURGERY — LARYNGOSCOPY, DIRECT
Anesthesia: General

## 2013-07-29 ENCOUNTER — Telehealth: Payer: Self-pay | Admitting: Family Medicine

## 2013-07-29 ENCOUNTER — Encounter (HOSPITAL_COMMUNITY): Payer: Self-pay | Admitting: Pharmacy Technician

## 2013-07-29 MED ORDER — GLIMEPIRIDE 1 MG PO TABS: 1.0000 mg | ORAL_TABLET | Freq: Every day | ORAL | Status: DC

## 2013-07-29 MED ORDER — METFORMIN HCL ER 500 MG PO TB24: 1000.0000 mg | ORAL_TABLET | Freq: Two times a day (BID) | ORAL | Status: DC

## 2013-07-29 MED ORDER — GABAPENTIN 300 MG PO CAPS: 300.0000 mg | ORAL_CAPSULE | Freq: Three times a day (TID) | ORAL | Status: DC

## 2013-07-29 NOTE — Telephone Encounter (Signed)
Pt is out of glimepiride (AMARYL) 1 MG tablet, HYDROcodone-acetaminophen (NORCO) 5-325 MG per tablet; pt says metFORMIN (GLUCOPHAGE XR) 500 MG 24 hr tablet were giving him upset stomach and diaharrea  so he discontinued taking them; pt had stopped taking hydrocodone also until pain reoccurred; pt also wanted to know if he can get a higher dosage for gabapentin (NEURONTIN) 100 MG capsule because he does not feel that it is working properly.

## 2013-07-31 MED ORDER — CEFAZOLIN SODIUM-DEXTROSE 2-3 GM-% IV SOLR
2.0000 g | INTRAVENOUS | Status: AC
  Filled 2013-07-31: qty 50

## 2013-08-01 ENCOUNTER — Encounter (HOSPITAL_COMMUNITY): Payer: Self-pay | Admitting: Certified Registered Nurse Anesthetist

## 2013-08-01 ENCOUNTER — Ambulatory Visit (HOSPITAL_COMMUNITY): Payer: Medicaid Other | Admitting: Certified Registered Nurse Anesthetist

## 2013-08-01 ENCOUNTER — Encounter (HOSPITAL_COMMUNITY)
Admission: RE | Disposition: A | Discharge status: Home / Self Care | Payer: Self-pay | Source: Ambulatory Visit | Attending: Otolaryngology

## 2013-08-01 ENCOUNTER — Ambulatory Visit (HOSPITAL_COMMUNITY)
Admission: RE | Admit: 2013-08-01 | Discharge: 2013-08-01 | Disposition: A | Discharge status: Home / Self Care | Payer: Medicaid Other | Source: Ambulatory Visit | Attending: Otolaryngology | Admitting: Otolaryngology

## 2013-08-01 DIAGNOSIS — F411 Generalized anxiety disorder: Secondary | ICD-10-CM | POA: Insufficient documentation

## 2013-08-01 DIAGNOSIS — J392 Other diseases of pharynx: Secondary | ICD-10-CM | POA: Insufficient documentation

## 2013-08-01 DIAGNOSIS — D106 Benign neoplasm of nasopharynx: Secondary | ICD-10-CM | POA: Insufficient documentation

## 2013-08-01 DIAGNOSIS — F3289 Other specified depressive episodes: Secondary | ICD-10-CM | POA: Insufficient documentation

## 2013-08-01 DIAGNOSIS — N289 Disorder of kidney and ureter, unspecified: Secondary | ICD-10-CM | POA: Insufficient documentation

## 2013-08-01 DIAGNOSIS — F1010 Alcohol abuse, uncomplicated: Secondary | ICD-10-CM | POA: Insufficient documentation

## 2013-08-01 DIAGNOSIS — Z79899 Other long term (current) drug therapy: Secondary | ICD-10-CM | POA: Insufficient documentation

## 2013-08-01 DIAGNOSIS — J449 Chronic obstructive pulmonary disease, unspecified: Secondary | ICD-10-CM | POA: Insufficient documentation

## 2013-08-01 DIAGNOSIS — I10 Essential (primary) hypertension: Secondary | ICD-10-CM | POA: Insufficient documentation

## 2013-08-01 DIAGNOSIS — F329 Major depressive disorder, single episode, unspecified: Secondary | ICD-10-CM | POA: Insufficient documentation

## 2013-08-01 DIAGNOSIS — F17200 Nicotine dependence, unspecified, uncomplicated: Secondary | ICD-10-CM | POA: Insufficient documentation

## 2013-08-01 DIAGNOSIS — C3490 Malignant neoplasm of unspecified part of unspecified bronchus or lung: Secondary | ICD-10-CM | POA: Insufficient documentation

## 2013-08-01 DIAGNOSIS — E119 Type 2 diabetes mellitus without complications: Secondary | ICD-10-CM | POA: Insufficient documentation

## 2013-08-01 DIAGNOSIS — J4489 Other specified chronic obstructive pulmonary disease: Secondary | ICD-10-CM | POA: Insufficient documentation

## 2013-08-01 HISTORY — PX: DIRECT LARYNGOSCOPY: SHX5326

## 2013-08-01 HISTORY — PX: TONSILLECTOMY: SHX5217

## 2013-08-01 LAB — CBC
HCT: 46 % (ref 39.0–52.0)
Hemoglobin: 15.7 g/dL (ref 13.0–17.0)
MCV: 83.5 fL (ref 78.0–100.0)
RBC: 5.51 MIL/uL (ref 4.22–5.81)
RDW: 17.5 % — ABNORMAL HIGH (ref 11.5–15.5)
WBC: 4.4 10*3/uL (ref 4.0–10.5)

## 2013-08-01 LAB — BASIC METABOLIC PANEL
BUN: 9 mg/dL (ref 6–23)
CO2: 22 mEq/L (ref 19–32)
Chloride: 107 mEq/L (ref 96–112)
Creatinine, Ser: 1.01 mg/dL (ref 0.50–1.35)
Glucose, Bld: 128 mg/dL — ABNORMAL HIGH (ref 70–99)
Potassium: 4 mEq/L (ref 3.5–5.1)

## 2013-08-01 SURGERY — LARYNGOSCOPY, DIRECT
Anesthesia: General | Site: Mouth | Laterality: Right | Wound class: Clean Contaminated

## 2013-08-01 MED ORDER — PROMETHAZINE HCL 25 MG/ML IJ SOLN: 6.2500 mg | INTRAMUSCULAR | Status: DC | PRN

## 2013-08-01 MED ORDER — LIDOCAINE HCL (CARDIAC) 20 MG/ML IV SOLN
INTRAVENOUS | Status: DC | PRN
  Administered 2013-08-01: 100 mg via INTRAVENOUS

## 2013-08-01 MED ORDER — NEOSTIGMINE METHYLSULFATE 1 MG/ML IJ SOLN
INTRAMUSCULAR | Status: DC | PRN
  Administered 2013-08-01: 5 mg via INTRAVENOUS

## 2013-08-01 MED ORDER — OXYCODONE HCL 5 MG PO TABS: 5.0000 mg | ORAL_TABLET | Freq: Once | ORAL | Status: AC | PRN

## 2013-08-01 MED ORDER — OXYCODONE HCL 5 MG/5ML PO SOLN: 5.0000 mg | Freq: Once | ORAL | Status: AC | PRN

## 2013-08-01 MED ORDER — PROPOFOL 10 MG/ML IV BOLUS
INTRAVENOUS | Status: DC | PRN
  Administered 2013-08-01: 200 mg via INTRAVENOUS

## 2013-08-01 MED ORDER — LACTATED RINGERS IV SOLN
INTRAVENOUS | Status: DC | PRN
  Administered 2013-08-01: 07:00:00 via INTRAVENOUS

## 2013-08-01 MED ORDER — ONDANSETRON HCL 4 MG/2ML IJ SOLN
INTRAMUSCULAR | Status: DC | PRN
  Administered 2013-08-01: 4 mg via INTRAVENOUS

## 2013-08-01 MED ORDER — MIDAZOLAM HCL 5 MG/5ML IJ SOLN
INTRAMUSCULAR | Status: DC | PRN
  Administered 2013-08-01: 2 mg via INTRAVENOUS

## 2013-08-01 MED ORDER — CEFAZOLIN SODIUM-DEXTROSE 2-3 GM-% IV SOLR
INTRAVENOUS | Status: DC | PRN
  Administered 2013-08-01: 2 g via INTRAVENOUS

## 2013-08-01 MED ORDER — FENTANYL CITRATE 0.05 MG/ML IJ SOLN
INTRAMUSCULAR | Status: DC | PRN
  Administered 2013-08-01: 25 ug via INTRAVENOUS
  Administered 2013-08-01: 75 ug via INTRAVENOUS
  Administered 2013-08-01 (×2): 50 ug via INTRAVENOUS

## 2013-08-01 MED ORDER — MIDAZOLAM HCL 2 MG/2ML IJ SOLN: 1.0000 mg | INTRAMUSCULAR | Status: DC | PRN

## 2013-08-01 MED ORDER — HYDROMORPHONE HCL PF 1 MG/ML IJ SOLN: 0.2500 mg | INTRAMUSCULAR | Status: DC | PRN

## 2013-08-01 MED ORDER — ROCURONIUM BROMIDE 100 MG/10ML IV SOLN
INTRAVENOUS | Status: DC | PRN
  Administered 2013-08-01: 50 mg via INTRAVENOUS

## 2013-08-01 MED ORDER — EPINEPHRINE HCL (NASAL) 0.1 % NA SOLN
NASAL | Status: AC
  Filled 2013-08-01: qty 30

## 2013-08-01 MED ORDER — LIDOCAINE-EPINEPHRINE 1 %-1:100000 IJ SOLN
INTRAMUSCULAR | Status: AC
  Filled 2013-08-01: qty 1

## 2013-08-01 MED ORDER — FENTANYL CITRATE 0.05 MG/ML IJ SOLN: 50.0000 ug | Freq: Once | INTRAMUSCULAR | Status: DC

## 2013-08-01 MED ORDER — GLYCOPYRROLATE 0.2 MG/ML IJ SOLN
INTRAMUSCULAR | Status: DC | PRN
  Administered 2013-08-01: .8 mg via INTRAVENOUS

## 2013-08-01 MED ORDER — OXYCODONE HCL 5 MG/5ML PO SOLN
ORAL | Status: AC
  Administered 2013-08-01: 5 mg via ORAL
  Filled 2013-08-01: qty 5

## 2013-08-01 MED ORDER — HYDROCODONE-ACETAMINOPHEN 7.5-325 MG PO TABS: 1.0000 | ORAL_TABLET | Freq: Four times a day (QID) | ORAL | Status: DC | PRN

## 2013-08-01 MED ORDER — LIDOCAINE HCL 4 % MT SOLN
OROMUCOSAL | Status: DC | PRN
  Administered 2013-08-01: 4 mL via TOPICAL

## 2013-08-01 MED ORDER — PROMETHAZINE HCL 25 MG RE SUPP: 25.0000 mg | Freq: Four times a day (QID) | RECTAL | Status: DC | PRN

## 2013-08-01 MED ORDER — LABETALOL HCL 5 MG/ML IV SOLN
INTRAVENOUS | Status: DC | PRN
  Administered 2013-08-01: 5 mg via INTRAVENOUS

## 2013-08-01 MED ORDER — 0.9 % SODIUM CHLORIDE (POUR BTL) OPTIME
TOPICAL | Status: DC | PRN
  Administered 2013-08-01: 1000 mL

## 2013-08-01 SURGICAL SUPPLY — 33 items
BALLN PULM 15 16.5 18X75 (BALLOONS)
BALLOON PULM 15 16.5 18X75 (BALLOONS) IMPLANT
CANISTER SUCTION 2500CC (MISCELLANEOUS) ×3 IMPLANT
CLOTH BEACON ORANGE TIMEOUT ST (SAFETY) ×3 IMPLANT
COVER MAYO STAND STRL (DRAPES) ×3 IMPLANT
COVER TABLE BACK 60X90 (DRAPES) ×3 IMPLANT
DRAPE PROXIMA HALF (DRAPES) ×3 IMPLANT
ELECT COATED BLADE 2.86 ST (ELECTRODE) ×3 IMPLANT
ELECT REM PT RETURN 9FT ADLT (ELECTROSURGICAL) ×3
ELECTRODE REM PT RTRN 9FT ADLT (ELECTROSURGICAL) ×2 IMPLANT
GLOVE BIOGEL PI IND STRL 6 (GLOVE) ×2 IMPLANT
GLOVE BIOGEL PI INDICATOR 6 (GLOVE) ×1
GLOVE ECLIPSE 7.5 STRL STRAW (GLOVE) ×3 IMPLANT
GLOVE SURG SS PI 6.5 STRL IVOR (GLOVE) ×3 IMPLANT
GOWN STRL NON-REIN LRG LVL3 (GOWN DISPOSABLE) ×6 IMPLANT
GUARD TEETH (MISCELLANEOUS) ×3 IMPLANT
KIT BASIN OR (CUSTOM PROCEDURE TRAY) ×3 IMPLANT
KIT ROOM TURNOVER OR (KITS) ×3 IMPLANT
MARKER SKIN DUAL TIP RULER LAB (MISCELLANEOUS) IMPLANT
NS IRRIG 1000ML POUR BTL (IV SOLUTION) ×3 IMPLANT
PAD ARMBOARD 7.5X6 YLW CONV (MISCELLANEOUS) ×6 IMPLANT
PATTIES SURGICAL .5 X3 (DISPOSABLE) ×3 IMPLANT
PENCIL FOOT CONTROL (ELECTRODE) ×3 IMPLANT
SPECIMEN JAR SMALL (MISCELLANEOUS) ×3 IMPLANT
SPONGE GAUZE 4X4 12PLY (GAUZE/BANDAGES/DRESSINGS) ×3 IMPLANT
SUT SILK 2 0 FS (SUTURE) ×3 IMPLANT
SYR BULB 3OZ (MISCELLANEOUS) ×3 IMPLANT
SYR CONTROL 10ML LL (SYRINGE) IMPLANT
SYR INFLATE BILIARY GAUGE (MISCELLANEOUS) IMPLANT
SYR TB 1ML LUER SLIP (SYRINGE) IMPLANT
TOWEL OR 17X24 6PK STRL BLUE (TOWEL DISPOSABLE) ×3 IMPLANT
TUBE CONNECTING 12X1/4 (SUCTIONS) ×3 IMPLANT
WATER STERILE IRR 1000ML POUR (IV SOLUTION) IMPLANT

## 2013-08-01 NOTE — Op Note (Signed)
08/01/2013  8:24 AM  PATIENT:  Rodney Powers  57 y.o. male  PRE-OPERATIVE DIAGNOSIS:  pharyngeal mass  POST-OPERATIVE DIAGNOSIS:  pharyngeal mass  PROCEDURE:  Procedure(s): DIRECT LARYNGOSCOPY WITH EXCISION OF PHARYNGEAL MASS TONSILLECTOMY  SURGEON:  Surgeon(s): Serena Colonel, MD  ANESTHESIA:   General  COUNTS: Correct   DICTATION: The patient was taken to the operating room and placed on the operating table in the supine position. Following induction of general endotracheal anesthesia, the table was turned and the patient was draped in a standard fashion. A tonsillectomy mouthgag was inserted into the oral cavity and used to retract the tongue and mandible, then attached to the Mayo stand. During the placement of the gag, his remaining right incisor was inadvertently extracted intact with its root. Several lower teeth that were in poor repair and loose were maintained. Inspection of the right inferior lateral pharyngeal wall revealed the mass was present and just inferior to the palatine tonsil. It was felt that tonsillectomy would afford much safer access and with the bladder margin around this lesion.  The tonsillectomy was then performed using electrocautery dissection, carefully dissecting the avascular plane between the capsule and constrictor muscles. Cautery was used for completion of hemostasis. The dissection continued inferiorly including the entire mass and surrounding mucosa. The specimen was labeled with a suture, short marking the tonsil, and a long suture marking the infra-tonsillar mass. This was sent for pathologic evaluation.  The pharynx was irrigated with saline and suctioned. The patient was then awakened from anesthesia and transferred to PACU in stable condition.   PATIENT DISPOSITION:  To PACA, stable

## 2013-08-01 NOTE — Anesthesia Postprocedure Evaluation (Signed)
  Anesthesia Post-op Note  Patient: Rodney Powers  Procedure(s) Performed: Procedure(s): DIRECT LARYNGOSCOPY WITH EXCISION OF PHARYNGEAL MASS (N/A) TONSILLECTOMY (Right)  Patient Location: PACU  Anesthesia Type:General  Level of Consciousness: awake and alert   Airway and Oxygen Therapy: Patient Spontanous Breathing  Post-op Pain: mild  Post-op Assessment: Post-op Vital signs reviewed, Patient's Cardiovascular Status Stable, Respiratory Function Stable, Patent Airway, No signs of Nausea or vomiting and Pain level controlled  Post-op Vital Signs: stable  Complications: No apparent anesthesia complications

## 2013-08-01 NOTE — Transfer of Care (Signed)
Immediate Anesthesia Transfer of Care Note  Patient: Rodney Powers  Procedure(s) Performed: Procedure(s): DIRECT LARYNGOSCOPY WITH EXCISION OF PHARYNGEAL MASS (N/A) TONSILLECTOMY (Right)  Patient Location: PACU  Anesthesia Type:General  Level of Consciousness: awake, alert  and oriented  Airway & Oxygen Therapy: Patient Spontanous Breathing and Patient connected to nasal cannula oxygen  Post-op Assessment: Report given to PACU RN and Post -op Vital signs reviewed and stable  Post vital signs: Reviewed and stable  Complications: No apparent anesthesia complications

## 2013-08-01 NOTE — Interval H&P Note (Signed)
History and Physical Interval Note:  08/01/2013 7:22 AM  Rodney Powers  has presented today for surgery, with the diagnosis of pharyngeal mass  The various methods of treatment have been discussed with the patient and family. After consideration of risks, benefits and other options for treatment, the patient has consented to  Procedure(s): DIRECT LARYNGOSCOPY WITH EXCISION OF PHARYNGEAL MASS (N/A) as a surgical intervention .  The patient's history has been reviewed, patient examined, no change in status, stable for surgery.  I have reviewed the patient's chart and labs.  Questions were answered to the patient's satisfaction.     Loreen Bankson

## 2013-08-01 NOTE — Anesthesia Preprocedure Evaluation (Signed)
Anesthesia Evaluation  Patient identified by MRN, date of birth, ID band Patient awake    Reviewed: Allergy & Precautions, H&P , NPO status , Patient's Chart, lab work & pertinent test results  Airway Mallampati: II      Dental  (+) Partial Upper, Partial Lower and Loose,    Pulmonary shortness of breath, COPDCurrent Smoker,  Lung ca + rhonchi         Cardiovascular hypertension, Rhythm:Regular Rate:Tachycardia     Neuro/Psych Anxiety Depression    GI/Hepatic (+)     substance abuse  alcohol use,   Endo/Other  diabetes  Renal/GU Renal disease     Musculoskeletal   Abdominal   Peds  Hematology   Anesthesia Other Findings   Reproductive/Obstetrics                           Anesthesia Physical Anesthesia Plan  ASA: III  Anesthesia Plan: General   Post-op Pain Management:    Induction:   Airway Management Planned: Oral ETT and Video Laryngoscope Planned  Additional Equipment:   Intra-op Plan:   Post-operative Plan: Possible Post-op intubation/ventilation  Informed Consent: I have reviewed the patients History and Physical, chart, labs and discussed the procedure including the risks, benefits and alternatives for the proposed anesthesia with the patient or authorized representative who has indicated his/her understanding and acceptance.     Plan Discussed with: CRNA and Surgeon  Anesthesia Plan Comments:         Anesthesia Quick Evaluation

## 2013-08-01 NOTE — Preoperative (Signed)
Beta Blockers   Reason not to administer Beta Blockers:Not Applicable 

## 2013-08-01 NOTE — H&P (View-Only) (Signed)
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 Lung cancer (162.9) (C34.90). Discussed  Just finished treatment for lung cancer. Complaining of right-sided sore throat. On exam, using the mirror, there is an exophytic lesion along the right lateral pharyngeal wall. The rest of the pharynx and larynx appears to be clear. We will schedule him for endoscopy under anesthesia with biopsies in the near future. Reason For Visit  Discuss surgery. Allergies  No Known Drug Allergies. Current Meds  Dexamethasone 4 MG Oral Tablet;; RPT Glimepiride TABS;; RPT Hydrocodone-Acetaminophen TABS;; RPT LORazepam TABS;; RPT Multivitamins TABS;; RPT Ondansetron HCl TABS;; RPT PACLitaxel 30 MG/5ML Intravenous Concentrate;; RPT Prochlorperazine SUPP;; RPT Tamsulosin HCl CAPS;; RPT RadiaPlexRx GEL;; RPT. Active Problems  Diabetes   (250.00) (E11.9) Lung cancer   (162.9) (C34.90) Oropharynx neoplasm   (239.0) (D37.9). PSH  Oral Surgery Tooth Extraction. Family Hx  Family history of hypertension: Mother (V17.49) (Z82.49). Personal Hx  Alcohol use; 2 drinks day or fewer No caffeine use Smoker (305.1) (Z72.0). Signature  Electronically signed by : Serena Colonel  M.D.; 06/09/2013 1:26 PM EST.

## 2013-08-01 NOTE — Anesthesia Procedure Notes (Signed)
Procedure Name: Intubation Date/Time: 08/01/2013 7:39 AM Performed by: Margaree Mackintosh Pre-anesthesia Checklist: Patient identified, Timeout performed, Emergency Drugs available, Suction available and Patient being monitored Patient Re-evaluated:Patient Re-evaluated prior to inductionOxygen Delivery Method: Circle system utilized Preoxygenation: Pre-oxygenation with 100% oxygen Intubation Type: IV induction Ventilation: Mask ventilation without difficulty and Oral airway inserted - appropriate to patient size Laryngoscope size: Glidescope. Grade View: Grade I Tube type: Oral Tube size: 7.0 mm Number of attempts: 1 Airway Equipment and Method: Stylet,  Video-laryngoscopy and LTA kit utilized Placement Confirmation: ETT inserted through vocal cords under direct vision,  positive ETCO2 and breath sounds checked- equal and bilateral Secured at: 24 cm Tube secured with: Tape Dental Injury: Teeth and Oropharynx as per pre-operative assessment

## 2013-08-02 ENCOUNTER — Encounter (HOSPITAL_BASED_OUTPATIENT_CLINIC_OR_DEPARTMENT_OTHER): Payer: Medicaid Other

## 2013-08-03 ENCOUNTER — Encounter (HOSPITAL_COMMUNITY): Payer: Self-pay | Admitting: Otolaryngology

## 2013-08-04 ENCOUNTER — Encounter: Payer: Self-pay | Admitting: Internal Medicine

## 2013-08-04 ENCOUNTER — Ambulatory Visit: Payer: Medicaid Other | Attending: Family Medicine | Admitting: Internal Medicine

## 2013-08-04 MED ORDER — GABAPENTIN 300 MG PO CAPS: 600.0000 mg | ORAL_CAPSULE | Freq: Three times a day (TID) | ORAL | Status: DC

## 2013-08-04 MED ORDER — MAGIC MOUTHWASH W/LIDOCAINE: 5.0000 mL | Freq: Four times a day (QID) | ORAL | Status: DC | PRN

## 2013-08-04 NOTE — Progress Notes (Unsigned)
Patient ID: Rodney Powers, male   DOB: 1956/08/12, 57 y.o.   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 Roselind Messier for Stage III-B non-small cell lung cancer, scheduled for repeat CT scan tomorrow. He presents to the clinic for evaluation of his neuropathy. He is on 300 mg 3 times a day with no relief he complains of pain in the fifth digit of his left hand as well as both his legs He states that he has some tablets of oxycodone, from his previous surgery. The patient recently had a pharyngeal mass removed and is complaining of a sore throat    No Known Allergies Past Medical History  Diagnosis Date  . Back pain, chronic   . Mediastinal mass   . Urinary retention   . Lung cancer 04/07/2013  . Diabetic neuropathy   . Diabetes mellitus, type II     takes Gimepimide and Metformin daily  . Enlarged prostate     takes Flomax every evening  . Hypertension     slightly elevated recently;no meds;just watching for now  . COPD (chronic obstructive pulmonary disease)     emphysema  . Shortness of breath     with exertion  . Pneumonia     hx of;last time about 51yrs ago  . History of bronchitis     last time 30yrs ago  . Joint pain     stiffness and cramping in both hands  . Urinary frequency   . Nocturia   . Anemia     no meds and hasn't had a transfusion ever  . Anxiety   . Depression     no meds required;hospitatlized 31yrs ago for this  . Hx of radiation therapy 04/19/13- 06/07/13    left upper central chest, 63 gray 35 fx  . Heart murmur     as a baby  . Diarrhea   . Neuropathy    Current Outpatient Prescriptions on File Prior to Visit  Medication Sig Dispense Refill  . glimepiride (AMARYL) 1 MG tablet Take 1 tablet (1 mg total) by mouth daily before breakfast.  30 tablet  3  . HYDROcodone-acetaminophen (NORCO) 7.5-325 MG per tablet Take 1 tablet by mouth every  6 (six) hours as needed for pain.  30 tablet  0  . Multiple Vitamin (MULTIVITAMIN WITH MINERALS) TABS Take 1 tablet by mouth daily.      Marland Kitchen glucose monitoring kit (FREESTYLE) monitoring kit 1 each by Does not apply route as needed for other.  1 each  0  . hyaluronate sodium (RADIAPLEXRX) GEL Apply 1 application topically 2 (two) times daily.  14 g  0  . HYDROcodone-acetaminophen (NORCO) 5-325 MG per tablet Take 1 tablet by mouth every 6 (six) hours as needed for pain.  60 tablet  0  . metFORMIN (GLUCOPHAGE XR) 500 MG 24 hr tablet Take 2 tablets (1,000 mg total) by mouth 2 (two) times daily with a meal.  120 tablet  3  . nicotine (NICODERM CQ - DOSED IN MG/24 HR) 7 mg/24hr patch Place 1 patch onto the skin daily.      . promethazine (PHENERGAN) 25 MG suppository Place 1 suppository (25 mg total) rectally every 6 (six) hours as needed for nausea.  12 each  0  . sucralfate (CARAFATE) 1 GM/10ML suspension Take 10 mLs (1 g total) by mouth 4 (four) times daily.  420 mL  0   No current facility-administered medications on file prior to visit.   Family History  Problem Relation Age of Onset  . Aneurysm Mother   . Heart attack Father   . Aneurysm Sister   . Cancer Brother    History   Social History  . Marital Status: Legally Separated    Spouse Name: N/A    Number of Children: 3  . Years of Education: N/A   Occupational History  .      [employer] man   Social History Main Topics  . Smoking status: Current Every Day Smoker -- 0.50 packs/day for 41 years    Types: Cigarettes    Start date: 07/18/1971  . Smokeless tobacco: Not on file     Comment: nicotene patch  . Alcohol Use: 8.4 oz/week    14 Cans of beer per week     Comment: daily beer 2  . Drug Use: No     Comment: History of Marijuana use 30 years ago  . Sexual Activity: No   Other Topics Concern  . Not on file   Social History Narrative  . No narrative on file    Review of Systems  Constitutional: Negative for fever,  chills, diaphoresis, activity change, appetite change and fatigue.  HENT: Negative for ear pain, nosebleeds, congestion, facial swelling, rhinorrhea, neck pain, neck stiffness and ear discharge.   Eyes: Negative for pain, discharge, redness, itching and visual disturbance.  Respiratory: Negative for cough, choking, chest tightness, shortness of breath, wheezing and stridor.   Cardiovascular: Negative for chest pain, palpitations and leg swelling.  Gastrointestinal: Negative for abdominal distention.  Genitourinary: Negative for dysuria, urgency, frequency, hematuria, flank pain, decreased urine volume, difficulty urinating and dyspareunia.  Musculoskeletal: Negative for back pain, joint swelling, arthralgias and gait problem.  Neurological: Negative for dizziness, tremors, seizures, syncope, facial asymmetry, speech difficulty, weakness, light-headedness, numbness and headaches.  Hematological: Negative for adenopathy. Does not bruise/bleed easily.  Psychiatric/Behavioral: Negative for hallucinations, behavioral problems, confusion, dysphoric mood, decreased concentration and agitation.    Objective:   Filed Vitals:   08/04/13 1750  BP: 155/89  Pulse: 102  Temp: 98.7 F (37.1 C)  Resp: 22    Physical Exam  Constitutional: Appears well-developed and well-nourished. No distress.  HENT: Normocephalic. External right and left ear normal. Oropharynx is clear and moist.  Eyes: Conjunctivae and EOM are normal. PERRLA, no scleral icterus.  Neck: Normal ROM. Neck supple. No JVD. No tracheal deviation. No thyromegaly.  CVS: RRR, S1/S2 +, no murmurs, no gallops, no carotid bruit.  Pulmonary: Effort and breath sounds normal, no stridor, rhonchi, wheezes, rales.  Abdominal: Soft. BS +,  no distension, tenderness, rebound or guarding.  Musculoskeletal: Normal range of motion. No edema and no tenderness.  Lymphadenopathy: No lymphadenopathy noted, cervical, inguinal. Neuro: Alert. Normal reflexes,  muscle tone coordination. No cranial nerve deficit. Skin: Skin is warm and dry. No rash noted. Not diaphoretic. No erythema. No pallor.  Psychiatric: Normal mood and affect. Behavior, judgment, thought content normal.   Lab Results  Component Value Date   WBC 4.4 08/01/2013   HGB 15.7 08/01/2013   HCT 46.0 08/01/2013   MCV 83.5 08/01/2013   PLT 147* 08/01/2013   Lab Results  Component Value Date   CREATININE 1.01 08/01/2013   BUN 9 08/01/2013   NA 141 08/01/2013   K 4.0 08/01/2013   CL 107 08/01/2013   CO2 22 08/01/2013    Lab Results  Component Value Date  HGBA1C 7.9* 05/17/2013   Lipid Panel  No results found for this basename: chol, trig, hdl, cholhdl, vldl, ldlcalc       Assessment and plan:   Patient Active Problem List   Diagnosis Date Noted  . Hx of radiation therapy   . Diabetic neuropathy, painful 06/24/2013  . Allodynia 06/24/2013  . Type II or unspecified type diabetes mellitus without mention of complication, not stated as uncontrolled 06/24/2013  . Mass of lung 06/06/2013  . COPD (chronic obstructive pulmonary disease) 05/02/2013  . Lung cancer 04/07/2013  . PNA (pneumonia) 03/06/2013  . Hyponatremia 03/06/2013  . AKI (acute kidney injury) 03/06/2013  . Protein-calorie malnutrition, moderate 03/06/2013  . Hyperglycemia 03/06/2013  . Leukocytosis 03/06/2013  . ETOH abuse 03/06/2013       Recent removal of pharyngeal mass Magic mouthwash with lidocaine prescribed Continue oxycodone  Peripheral neuropathy Patient described gabapentin 600 mg 3 times a day  History of lung cancer Per Dr. Antony Blackbird no recurrence, patient is recovering from the effects of radiation, no clinical evidence of recurrence He is scheduled for a CT scan in August and is also scheduled for medical oncology followup

## 2013-08-04 NOTE — Progress Notes (Unsigned)
PT HERE WITH C/O WORSENING NEUROPATHY IN BOTH FEET. STATES DOCTOR DECREASED GABAPENTIN 100MG  TID MONITORING BLOOD SUGARS AT HOME

## 2013-08-05 ENCOUNTER — Encounter (HOSPITAL_COMMUNITY): Payer: Self-pay

## 2013-08-05 ENCOUNTER — Ambulatory Visit (HOSPITAL_COMMUNITY)
Admission: RE | Admit: 2013-08-05 | Discharge: 2013-08-05 | Disposition: A | Discharge status: Home / Self Care | Payer: Medicaid Other | Source: Ambulatory Visit | Attending: Oncology | Admitting: Oncology

## 2013-08-05 ENCOUNTER — Telehealth: Payer: Self-pay | Admitting: Dietician

## 2013-08-05 ENCOUNTER — Telehealth: Payer: Self-pay | Admitting: Internal Medicine

## 2013-08-05 ENCOUNTER — Other Ambulatory Visit: Payer: Medicaid Other | Admitting: Lab

## 2013-08-05 ENCOUNTER — Other Ambulatory Visit (HOSPITAL_BASED_OUTPATIENT_CLINIC_OR_DEPARTMENT_OTHER): Payer: Medicaid Other | Admitting: Lab

## 2013-08-05 DIAGNOSIS — C3490 Malignant neoplasm of unspecified part of unspecified bronchus or lung: Secondary | ICD-10-CM | POA: Insufficient documentation

## 2013-08-05 DIAGNOSIS — J438 Other emphysema: Secondary | ICD-10-CM | POA: Insufficient documentation

## 2013-08-05 DIAGNOSIS — Z923 Personal history of irradiation: Secondary | ICD-10-CM | POA: Insufficient documentation

## 2013-08-05 DIAGNOSIS — R599 Enlarged lymph nodes, unspecified: Secondary | ICD-10-CM | POA: Insufficient documentation

## 2013-08-05 LAB — CBC WITH DIFFERENTIAL/PLATELET
BASO%: 0.6 % (ref 0.0–2.0)
HCT: 46.5 % (ref 38.4–49.9)
LYMPH%: 23.5 % (ref 14.0–49.0)
MCH: 27.8 pg (ref 27.2–33.4)
MCHC: 33.1 g/dL (ref 32.0–36.0)
MCV: 83.9 fL (ref 79.3–98.0)
MONO#: 0.4 10*3/uL (ref 0.1–0.9)
MONO%: 10.5 % (ref 0.0–14.0)
NEUT%: 61.8 % (ref 39.0–75.0)
Platelets: 240 10*3/uL (ref 140–400)
RBC: 5.54 10*6/uL (ref 4.20–5.82)
WBC: 3.6 10*3/uL — ABNORMAL LOW (ref 4.0–10.3)
nRBC: 0 % (ref 0–0)

## 2013-08-05 LAB — COMPREHENSIVE METABOLIC PANEL (CC13)
AST: 16 U/L (ref 5–34)
BUN: 8.4 mg/dL (ref 7.0–26.0)
Calcium: 10 mg/dL (ref 8.4–10.4)
Chloride: 112 mEq/L — ABNORMAL HIGH (ref 98–109)
Creatinine: 1 mg/dL (ref 0.7–1.3)
Total Bilirubin: 0.45 mg/dL (ref 0.20–1.20)

## 2013-08-05 MED ORDER — IOHEXOL 300 MG/ML  SOLN
80.0000 mL | Freq: Once | INTRAMUSCULAR | Status: AC | PRN
  Administered 2013-08-05: 80 mL via INTRAVENOUS

## 2013-08-05 MED ORDER — BIOTENE ORALBALANCE DRY MOUTH MT LIQD: 5.0000 mL | Freq: Two times a day (BID) | OROMUCOSAL | Status: DC

## 2013-08-05 NOTE — Telephone Encounter (Signed)
[facility] outpatient pharmacy calling about script for Alum & Mag Hydroxide-Simeth (MAGIC MOUTHWASH W/LIDOCAINE) SOLN [[id number]].  Pharmacist needs to know: 1. Ingredients 2. Proportions 3. Whether pt needs to swish and spit or swallow.

## 2013-08-05 NOTE — Telephone Encounter (Signed)
Artificial Saliva (BIOTENE ORALBALANCE DRY MOUTH) LIQD, not available for order right now at [facility] outpatient pharm.

## 2013-08-08 ENCOUNTER — Ambulatory Visit (HOSPITAL_BASED_OUTPATIENT_CLINIC_OR_DEPARTMENT_OTHER): Payer: Medicaid Other | Admitting: Hematology and Oncology

## 2013-08-08 DIAGNOSIS — C3410 Malignant neoplasm of upper lobe, unspecified bronchus or lung: Secondary | ICD-10-CM

## 2013-08-10 ENCOUNTER — Telehealth: Payer: Self-pay | Admitting: Hematology and Oncology

## 2013-08-10 NOTE — Telephone Encounter (Signed)
lmonvm for pt re appts for lb/ct on 11/26 and f/u 12/1. Schedule mailed.

## 2013-08-10 NOTE — Progress Notes (Signed)
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   PAST THERAPY:  Chemoradiation with weekly chemo Carboplatin/Taxol and daily XRT  (04/18/2013-05/27/2013).   INTERVAL HISTORY:  Rodney Powers 57 y.o. 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 - PAS STAIN IS NEGATIVE FOR FUNGAL ORGANISMS. - NO DYSPLASIA OR MALIGNANCY IDENTIFIED. Patient had on 08/01/2013 DIRECT LARYNGOSCOPY WITH EXCISION OF PHARYNGEAL MASS and  TONSILLECTOMY.  Pathology reported Tonsil, resection for tumor, right, and pharyngeal mass - INFLAMED SQUAMOUS PAPILLOMA. - NO DYSPLASIA OR MALIGNANCY. The patient reported odynophagia after surgery. He has dyspnea and, occasionally cough. Rodney Powers complains on pain in fingers and hands; numbness and tingling in fingertips secondary to neuropathy from diabetes mellitus and rash. The patient denied fever, chills, night sweats, change in appetite or weight. He denied headaches, double vision, blurry vision, nasal congestion, nasal discharge, hearing problems, odynophagia or dysphagia. No chest pain, palpitations, dyspnea, cough, abdominal pain, nausea, vomiting, diarrhea, constipation, hematochezia. The patient denied dysuria, nocturia, polyuria, hematuria, myalgia, numbness, tingling, psychiatric problems.   Review of Systems  Constitutional: Negative for fever, chills, weight loss, malaise/fatigue and diaphoresis.  HENT: Negative for hearing loss, congestion, sore throat, neck pain, tinnitus and ear discharge.   Eyes: Negative for blurred vision, double vision and photophobia.  Respiratory: Positive for cough and shortness of breath. Negative for  hemoptysis, sputum production and wheezing.   Cardiovascular: Negative for chest pain, palpitations, orthopnea, claudication, leg swelling and PND.  Gastrointestinal: Negative for heartburn, nausea, vomiting, abdominal pain, diarrhea, constipation and blood in stool.  Genitourinary: Negative for dysuria, urgency, frequency and hematuria.  Musculoskeletal: Positive for joint pain. Negative for myalgias and back pain.  Skin: Negative for itching and rash.  Neurological: Positive for tingling and sensory change. Negative for dizziness, focal weakness, seizures, loss of consciousness, weakness and headaches.  Endo/Heme/Allergies: Does not bruise/bleed easily.  Psychiatric/Behavioral: Negative.     PAST MEDICAL HISTORY: Past Medical History  Diagnosis Date  . Back pain, chronic   . Mediastinal mass   . Urinary retention   . Lung cancer 04/07/2013  . Diabetic neuropathy   . Diabetes mellitus, type II     takes Gimepimide and Metformin daily  . Enlarged prostate     takes Flomax every evening  . Hypertension     slightly elevated recently;no meds;just watching for now  . COPD (chronic obstructive pulmonary disease)     emphysema  . Shortness of breath     with exertion  . Pneumonia     hx of;last time about 28yrs ago  . History of bronchitis     last time 73yrs ago  . Joint pain     stiffness and cramping in both hands  . Urinary frequency   . Nocturia   . Anemia     no meds and hasn't had a transfusion ever  . Anxiety   . Depression     no meds required;hospitatlized 34yrs ago for this  . Hx of radiation therapy 04/19/13- 06/07/13    left upper central chest, 63 gray 35 fx  . Heart murmur     as a baby  . Diarrhea   . Neuropathy     PAST SURGICAL HISTORY:  Past Surgical History  Procedure Laterality Date  . Video bronchoscopy with endobronchial ultrasound Bilateral 03/10/2013    Procedure: VIDEO BRONCHOSCOPY WITH ENDOBRONCHIAL ULTRASOUND;  Surgeon: Oretha Milch, MD;  Location:  [facility] OR;  Service: Pulmonary;  Laterality: Bilateral;  . Mediastinotomy chamberlain mcneil Left 03/24/2013    Procedure: MEDIASTINOTOMY CHAMBERLAIN MCNEIL;  Surgeon: Loreli Slot, MD;  Location: [facility] OR;  Service: Thoracic;  Laterality: Left;  . Excisional hemorrhoidectomy      > 30 yrs ago  . Colonoscopy    . Direct laryngoscopy N/A 07/01/2013    Procedure: DIRECT LARYNGOSCOPY;  Surgeon: Serena Colonel, MD;  Location: [facility] OR;  Service: ENT;  Laterality: N/A;  . Esophagoscopy N/A 07/01/2013    Procedure: ESOPHAGOSCOPY WITH BIOPSY;  Surgeon: Serena Colonel, MD;  Location: [facility] OR;  Service: ENT;  Laterality: N/A;  . Direct laryngoscopy N/A 08/01/2013    Procedure: DIRECT LARYNGOSCOPY WITH EXCISION OF PHARYNGEAL MASS;  Surgeon: Serena Colonel, MD;  Location: [facility] OR;  Service: ENT;  Laterality: N/A;  . Tonsillectomy Right 08/01/2013    Procedure: TONSILLECTOMY;  Surgeon: Serena Colonel, MD;  Location: [facility] OR;  Service: ENT;  Laterality: Right;    FAMILY HISTORY Family History  Problem Relation Age of Onset  . Aneurysm Mother   . Heart attack Father   . Aneurysm Sister   . Cancer Brother     HEALTH MAINTENANCE: History  Substance Use Topics  . Smoking status: Current Every Day Smoker -- 0.50 packs/day for 41 years    Types: Cigarettes    Start date: 07/18/1971  . Smokeless tobacco: Not on file     Comment: nicotene patch  . Alcohol Use: 8.4 oz/week    14 Cans of beer per week     Comment: daily beer 2    No Known Allergies  Current Outpatient Prescriptions  Medication Sig Dispense Refill  . gabapentin (NEURONTIN) 300 MG capsule Take 2 capsules (600 mg total) by mouth 3 (three) times daily.  200 capsule  1  . glimepiride (AMARYL) 1 MG tablet Take 1 tablet (1 mg total) by mouth daily before breakfast.  30 tablet  3  . glucose monitoring kit (FREESTYLE) monitoring kit 1 each by Does not apply route as needed for other.  1 each  0  . hyaluronate sodium (RADIAPLEXRX) GEL Apply 1 application  topically 2 (two) times daily.  14 g  0  . HYDROcodone-acetaminophen (NORCO) 7.5-325 MG per tablet Take 1 tablet by mouth every 6 (six) hours as needed for pain.  30 tablet  0  . metFORMIN (GLUCOPHAGE XR) 500 MG 24 hr tablet Take 2 tablets (1,000 mg total) by mouth 2 (two) times daily with a meal.  120 tablet  3  . Multiple Vitamin (MULTIVITAMIN WITH MINERALS) TABS Take 1 tablet by mouth daily.      . nicotine (NICODERM CQ - DOSED IN MG/24 HR) 7 mg/24hr patch Place 1 patch onto the skin daily.      . promethazine (PHENERGAN) 25 MG suppository Place 1 suppository (25 mg total) rectally every 6 (six) hours as needed for nausea.  12 each  0  . sucralfate (CARAFATE) 1 GM/10ML suspension Take 10 mLs (1 g total) by mouth 4 (four) times daily.  420 mL  0  . Artificial Saliva (BIOTENE ORALBALANCE DRY MOUTH) LIQD Use as directed 5 mLs in the mouth or throat 2 (two) times daily.  1 Bottle  3   No current facility-administered medications for this visit.  OBJECTIVE: Filed Vitals:   08/08/13 1325  BP: 159/81  Pulse: 83  Temp: 98.1 F (36.7 C)  Resp: 20     Body mass index is 27.24 kg/(m^2).    ECOG FS:1  Mass on right side of forehead for at least 30 years without change. Sclerae un icteric Oropharynx clear No cervical or supraclavicular adenopathy Lungs no rales or rhonchi Heart regular rate and rhythm Abd benign MSK no focal spinal tenderness, no peripheral edema Neuro: non-focal, well-oriented, appropriate affect    LAB RESULTS:  CMP     Component Value Date/Time   NA 142 08/05/2013 0945   NA 141 08/01/2013 0640   K 3.8 08/05/2013 0945   K 4.0 08/01/2013 0640   CL 107 08/01/2013 0640   CL 104 06/06/2013 0841   CO2 19* 08/05/2013 0945   CO2 22 08/01/2013 0640   GLUCOSE 97 08/05/2013 0945   GLUCOSE 128* 08/01/2013 0640   GLUCOSE 258* 06/06/2013 0841   BUN 8.4 08/05/2013 0945   BUN 9 08/01/2013 0640   CREATININE 1.0 08/05/2013 0945   CREATININE 1.01 08/01/2013 0640   CALCIUM 10.0  08/05/2013 0945   CALCIUM 9.9 08/01/2013 0640   PROT 7.4 08/05/2013 0945   PROT 7.3 03/23/2013 1229   ALBUMIN 3.6 08/05/2013 0945   ALBUMIN 2.5* 03/23/2013 1229   AST 16 08/05/2013 0945   AST 30 03/23/2013 1229   ALT 13 08/05/2013 0945   ALT 70* 03/23/2013 1229   ALKPHOS 73 08/05/2013 0945   ALKPHOS 103 03/23/2013 1229   BILITOT 0.45 08/05/2013 0945   BILITOT 0.2* 03/23/2013 1229   GFRNONAA 81* 08/01/2013 0640   GFRAA >90 08/01/2013 0640    Lab Results  Component Value Date   WBC 3.6* 08/05/2013   NEUTROABS 2.2 08/05/2013   HGB 15.4 08/05/2013   HCT 46.5 08/05/2013   MCV 83.9 08/05/2013   PLT 240 08/05/2013      Chemistry      Component Value Date/Time   NA 142 08/05/2013 0945   NA 141 08/01/2013 0640   K 3.8 08/05/2013 0945   K 4.0 08/01/2013 0640   CL 107 08/01/2013 0640   CL 104 06/06/2013 0841   CO2 19* 08/05/2013 0945   CO2 22 08/01/2013 0640   BUN 8.4 08/05/2013 0945   BUN 9 08/01/2013 0640   CREATININE 1.0 08/05/2013 0945   CREATININE 1.01 08/01/2013 0640      Component Value Date/Time   CALCIUM 10.0 08/05/2013 0945   CALCIUM 9.9 08/01/2013 0640   ALKPHOS 73 08/05/2013 0945   ALKPHOS 103 03/23/2013 1229   AST 16 08/05/2013 0945   AST 30 03/23/2013 1229   ALT 13 08/05/2013 0945   ALT 70* 03/23/2013 1229   BILITOT 0.45 08/05/2013 0945   BILITOT 0.2* 03/23/2013 1229      STUDIES: Ct Chest W Contrast  08/05/2013   *RADIOLOGY REPORT*  Clinical Data: Lung cancer  CT CHEST WITH CONTRAST  Technique:  Multidetector CT imaging of the chest was performed following the standard protocol during bolus administration of intravenous contrast.  Contrast: 80mL OMNIPAQUE IOHEXOL 300 MG/ML  SOLN  Comparison: PET CT from 03/15/2013.  CT chest from 03/06/2013.  Findings: There is no axillary lymphadenopathy.  The large left mediastinal lesions seen previously has decreased substantially in size in the interval.  The lesion now measures 4.6 x 2.0 cm compared to 6.5 x 5.5 cm previously.  Abnormal soft tissue tracks anteriorly  in the mediastinum to the anteromedial left  chest wall where a 2.3 x 2.2 cm low density lesion is identified (see image 22 of series 2).  The paratracheal and AP window adenopathy has decreased in the interval.  The right paratracheal index lymph node has decreased in size from 1.4 cm to 0.9 cm when measuring in short axis.  The index AP window lymph node measures 9 mm in short axis today compared to 14 mm previously.  The heart size is normal.  There is a trace anterior pericardial fluid or thickening.  No evidence for pleural effusion.  Lung windows show advanced emphysema as before.  No parenchymal nodule or mass is seen in the right lung.  The 7 mm posterior left upper lobe pulmonary nodule seen previously now measures 4 mm.  There is some atelectasis or scarring in the left lower lobe, as before.  Images which include the upper abdomen show no evidence for an adrenal nodule or mass.  Bone windows reveal no worrisome lytic or sclerotic osseous lesions.  IMPRESSION: Marked interval response to therapy with a substantial decrease in size of the large left mediastinal mass and decrease in paratracheal and AP window lymphadenopathy.  The patient has developed a new 2.3 cm low density lesion in the left anteromedial chest wall.  This is located between the left second and third costochondral cartilage.  This measures near water density and may be related to a post-treatment fluid  collection.  Close continued attention is recommended.  Posterior left upper lobe pulmonary nodule has decreased in size of 4 mm.   Original Report Authenticated By: Kennith Center, M.D.    ASSESSMENT AND PLAN: 1. Diagnosis: Squamous cell carcinoma of the lung.  S/p concurrent chemo radiation with daily XRT and weekly chemo Carboplatin/Taxol.  Chest CT on 8/22/22014 showed  substantial decrease in size of the large left mediastinal mass and decrease in paratracheal and AP window lymphadenopathy;  Development  a new 2.3 cm low density  lesion in the left anteromedial chest wall which may be related to a post-treatment fluid  collection.  2. No hypopharynx cancer:  3. Follow up: In 3 months with a CT of the chest 1-2 days prior to the visit.   Myra Rude, MD   08/08/2013 2:59 PM

## 2013-08-19 ENCOUNTER — Ambulatory Visit: Payer: Medicaid Other | Attending: Family Medicine | Admitting: Internal Medicine

## 2013-08-19 VITALS — BP 171/93 | HR 74 | Temp 97.7°F | Resp 16

## 2013-08-19 DIAGNOSIS — E1149 Type 2 diabetes mellitus with other diabetic neurological complication: Secondary | ICD-10-CM | POA: Insufficient documentation

## 2013-08-19 DIAGNOSIS — M79609 Pain in unspecified limb: Secondary | ICD-10-CM | POA: Insufficient documentation

## 2013-08-19 DIAGNOSIS — F17200 Nicotine dependence, unspecified, uncomplicated: Secondary | ICD-10-CM | POA: Insufficient documentation

## 2013-08-19 DIAGNOSIS — E1310 Other specified diabetes mellitus with ketoacidosis without coma: Secondary | ICD-10-CM

## 2013-08-19 DIAGNOSIS — I10 Essential (primary) hypertension: Secondary | ICD-10-CM | POA: Insufficient documentation

## 2013-08-19 DIAGNOSIS — C3490 Malignant neoplasm of unspecified part of unspecified bronchus or lung: Secondary | ICD-10-CM | POA: Insufficient documentation

## 2013-08-19 DIAGNOSIS — E1110 Type 2 diabetes mellitus with ketoacidosis without coma: Secondary | ICD-10-CM

## 2013-08-19 DIAGNOSIS — E1140 Type 2 diabetes mellitus with diabetic neuropathy, unspecified: Secondary | ICD-10-CM

## 2013-08-19 DIAGNOSIS — J392 Other diseases of pharynx: Secondary | ICD-10-CM | POA: Insufficient documentation

## 2013-08-19 DIAGNOSIS — J449 Chronic obstructive pulmonary disease, unspecified: Secondary | ICD-10-CM | POA: Insufficient documentation

## 2013-08-19 DIAGNOSIS — E1142 Type 2 diabetes mellitus with diabetic polyneuropathy: Secondary | ICD-10-CM | POA: Insufficient documentation

## 2013-08-19 DIAGNOSIS — J4489 Other specified chronic obstructive pulmonary disease: Secondary | ICD-10-CM | POA: Insufficient documentation

## 2013-08-19 LAB — GLUCOSE, POCT (MANUAL RESULT ENTRY): POC Glucose: 80 mg/dl (ref 70–99)

## 2013-08-19 MED ORDER — HYDROCODONE-ACETAMINOPHEN 7.5-325 MG PO TABS: 1.0000 | ORAL_TABLET | Freq: Four times a day (QID) | ORAL | Status: DC | PRN

## 2013-08-19 NOTE — Progress Notes (Signed)
Patient ID: Rodney Powers, male   DOB: 27-May-1956, 57 y.o.   MRN: [id number]  PCP:  Jeanann Lewandowsky, MD    Chief Complaint:  Followup  HPI: 57 year old male with COPD, type 2 diabetes mellitus with painful diabetic neuropathy, stage III non-small cell lung cancer of the left lung treated with chemoradiation until June of this year was seen in the clinic for painful diabetic neuropathy off his left hand and right leg. Patient was increased on his dose of Neurontin to 600 mg 3 times a day and also given a course of Vicodin which seems to be helping with his symptoms. He denies any headache, dizziness, blurred vision, chest pain, palpitations, shortness of breath, nausea, vomiting, fever, chills, abdominal pain, bowel or urinary symptoms. She has been falling with his oncologist regularly.   Allergies: No Known Allergies  Prior to Admission medications   Medication Sig Start Date End Date Taking? Authorizing Provider  Artificial Saliva (BIOTENE ORALBALANCE DRY MOUTH) LIQD Use as directed 5 mLs in the mouth or throat 2 (two) times daily. 08/05/13   Richarda Overlie, MD  gabapentin (NEURONTIN) 300 MG capsule Take 2 capsules (600 mg total) by mouth 3 (three) times daily. 08/04/13   Richarda Overlie, MD  glimepiride (AMARYL) 1 MG tablet Take 1 tablet (1 mg total) by mouth daily before breakfast. 07/29/13   Jeanann Lewandowsky, MD  glucose monitoring kit (FREESTYLE) monitoring kit 1 each by Does not apply route as needed for other. 04/19/13   Jaiyla Granados, MD  hyaluronate sodium (RADIAPLEXRX) GEL Apply 1 application topically 2 (two) times daily. 06/06/13   Myrtis Ser, NP  HYDROcodone-acetaminophen (NORCO) 7.5-325 MG per tablet Take 1 tablet by mouth every 6 (six) hours as needed for pain. 08/19/13   Antwonette Feliz, MD  metFORMIN (GLUCOPHAGE XR) 500 MG 24 hr tablet Take 2 tablets (1,000 mg total) by mouth 2 (two) times daily with a meal. 07/29/13   Jeanann Lewandowsky, MD  Multiple Vitamin (MULTIVITAMIN  WITH MINERALS) TABS Take 1 tablet by mouth daily.    Historical Provider, MD  nicotine (NICODERM CQ - DOSED IN MG/24 HR) 7 mg/24hr patch Place 1 patch onto the skin daily.    Historical Provider, MD  promethazine (PHENERGAN) 25 MG suppository Place 1 suppository (25 mg total) rectally every 6 (six) hours as needed for nausea. 08/01/13   Serena Colonel, MD  sucralfate (CARAFATE) 1 GM/10ML suspension Take 10 mLs (1 g total) by mouth 4 (four) times daily. 06/07/13   Billie Lade, MD    Past Medical History  Diagnosis Date  . Back pain, chronic   . Mediastinal mass   . Urinary retention   . Lung cancer 04/07/2013  . Diabetic neuropathy   . Diabetes mellitus, type II     takes Gimepimide and Metformin daily  . Enlarged prostate     takes Flomax every evening  . Hypertension     slightly elevated recently;no meds;just watching for now  . COPD (chronic obstructive pulmonary disease)     emphysema  . Shortness of breath     with exertion  . Pneumonia     hx of;last time about 5yrs ago  . History of bronchitis     last time 24yrs ago  . Joint pain     stiffness and cramping in both hands  . Urinary frequency   . Nocturia   . Anemia     no meds and hasn't had a transfusion ever  . Anxiety   .  Depression     no meds required;hospitatlized 84yrs ago for this  . Hx of radiation therapy 04/19/13- 06/07/13    left upper central chest, 63 gray 35 fx  . Heart murmur     as a baby  . Diarrhea   . Neuropathy     Past Surgical History  Procedure Laterality Date  . Video bronchoscopy with endobronchial ultrasound Bilateral 03/10/2013    Procedure: VIDEO BRONCHOSCOPY WITH ENDOBRONCHIAL ULTRASOUND;  Surgeon: Oretha Milch, MD;  Location: [facility] OR;  Service: Pulmonary;  Laterality: Bilateral;  . Mediastinotomy chamberlain mcneil Left 03/24/2013    Procedure: MEDIASTINOTOMY CHAMBERLAIN MCNEIL;  Surgeon: Loreli Slot, MD;  Location: [facility] OR;  Service: Thoracic;  Laterality: Left;  . Excisional  hemorrhoidectomy      > 30 yrs ago  . Colonoscopy    . Direct laryngoscopy N/A 07/01/2013    Procedure: DIRECT LARYNGOSCOPY;  Surgeon: Serena Colonel, MD;  Location: [facility] OR;  Service: ENT;  Laterality: N/A;  . Esophagoscopy N/A 07/01/2013    Procedure: ESOPHAGOSCOPY WITH BIOPSY;  Surgeon: Serena Colonel, MD;  Location: [facility] OR;  Service: ENT;  Laterality: N/A;  . Direct laryngoscopy N/A 08/01/2013    Procedure: DIRECT LARYNGOSCOPY WITH EXCISION OF PHARYNGEAL MASS;  Surgeon: Serena Colonel, MD;  Location: [facility] OR;  Service: ENT;  Laterality: N/A;  . Tonsillectomy Right 08/01/2013    Procedure: TONSILLECTOMY;  Surgeon: Serena Colonel, MD;  Location: [facility] OR;  Service: ENT;  Laterality: Right;    Social History:  reports that he has been smoking Cigarettes.  He started smoking about 42 years ago. He has a 20.5 pack-year smoking history. He does not have any smokeless tobacco history on file. He reports that he drinks about 8.4 ounces of alcohol per week. He reports that he does not use illicit drugs.  Family History  Problem Relation Age of Onset  . Aneurysm Mother   . Heart attack Father   . Aneurysm Sister   . Cancer Brother     Review of Systems:  As outlined in history of presenting illness   Physical Exam:  Filed Vitals:   08/19/13 1258  BP: 171/93  Pulse: 74  Temp: 97.7 F (36.5 C)  Resp: 16  SpO2: 100%    Constitutional: Middle aged male in no distress HEENT: No pallor, moist oral mucosa Chest: Clear to auscultation bilaterally, no added sounds CVS: Normal S1-S2, no murmurs rub or gallop Abdomen: Soft, nontender, nondistended, bowel sounds present Extremities: Warm, no edema CNS: AAO x3    Labs on Admission:  Results for orders placed in visit on 08/19/13 (from the past 48 hour(s))  GLUCOSE, POCT (MANUAL RESULT ENTRY)     Status: None   Collection Time    08/19/13  1:00 PM      Result Value Range   POC Glucose 80  70 - 99 mg/dl    Radiological Exams on Admission: Ct Chest W  Contrast  08/05/2013   *RADIOLOGY REPORT*  Clinical Data: Lung cancer  CT CHEST WITH CONTRAST  Technique:  Multidetector CT imaging of the chest was performed following the standard protocol during bolus administration of intravenous contrast.  Contrast: 80mL OMNIPAQUE IOHEXOL 300 MG/ML  SOLN  Comparison: PET CT from 03/15/2013.  CT chest from 03/06/2013.  Findings: There is no axillary lymphadenopathy.  The large left mediastinal lesions seen previously has decreased substantially in size in the interval.  The lesion now measures 4.6 x 2.0 cm compared to 6.5 x 5.5 cm  previously.  Abnormal soft tissue tracks anteriorly in the mediastinum to the anteromedial left chest wall where a 2.3 x 2.2 cm low density lesion is identified (see image 22 of series 2).  The paratracheal and AP window adenopathy has decreased in the interval.  The right paratracheal index lymph node has decreased in size from 1.4 cm to 0.9 cm when measuring in short axis.  The index AP window lymph node measures 9 mm in short axis today compared to 14 mm previously.  The heart size is normal.  There is a trace anterior pericardial fluid or thickening.  No evidence for pleural effusion.  Lung windows show advanced emphysema as before.  No parenchymal nodule or mass is seen in the right lung.  The 7 mm posterior left upper lobe pulmonary nodule seen previously now measures 4 mm.  There is some atelectasis or scarring in the left lower lobe, as before.  Images which include the upper abdomen show no evidence for an adrenal nodule or mass.  Bone windows reveal no worrisome lytic or sclerotic osseous lesions.  IMPRESSION: Marked interval response to therapy with a substantial decrease in size of the large left mediastinal mass and decrease in paratracheal and AP window lymphadenopathy.  The patient has developed a new 2.3 cm low density lesion in the left anteromedial chest wall.  This is located between the left second and third costochondral  cartilage.  This measures near water density and may be related to a post-treatment fluid  collection.  Close continued attention is recommended.  Posterior left upper lobe pulmonary nodule has decreased in size of 4 mm.   Original Report Authenticated By: Kennith Center, M.D.    Assessment/Plan Painful diabetic neuropathy  increased her dose of gabapentin recently which seems to be helping with his symptoms. Also given a course of Vicodin which helps him as well. I did give him one more refill of Vicodin ( total 30 pills)  Diabetes mellitus Last A1c of 7.9 . Will recheck today.  Continue current medications.  Lung cancer Completed chemoradiation. I was with oncologist Dr. Roanna Raider. Recent CT scan of the chest showed decrease in size of the tumor and recommended for a repeat CT scan in 3 months.  Pharyngeal mass Status post resection recently. No issues  Elevated BP No hx of HTN. Possibly in the setting of pain. Will monitor during next visit  Followup at three-month.   Stesha Neyens 08/19/2013, 1:33 PM

## 2013-08-19 NOTE — Progress Notes (Signed)
Patient here for follow up_dm 

## 2013-09-07 NOTE — Telephone Encounter (Signed)
Dr Susie Cassette called pharm.

## 2013-09-23 ENCOUNTER — Ambulatory Visit: Payer: Medicaid Other

## 2013-10-17 ENCOUNTER — Other Ambulatory Visit: Payer: Self-pay | Admitting: Internal Medicine

## 2013-10-20 MED ORDER — GABAPENTIN 300 MG PO CAPS: 600.0000 mg | ORAL_CAPSULE | Freq: Three times a day (TID) | ORAL | Status: DC

## 2013-10-20 NOTE — Telephone Encounter (Signed)
Medication reordered and sent to [facility] outpt. pharmacy

## 2013-10-20 NOTE — Telephone Encounter (Addendum)
PT. Needs refill on Gabapentin, can you please send to [facility], Pt. Has doctor's appt on 11/07/13 at 10:15 with our office

## 2013-10-26 ENCOUNTER — Emergency Department (HOSPITAL_COMMUNITY)
Admission: EM | Admit: 2013-10-26 | Discharge: 2013-10-26 | Discharge status: Against Medical Advice | Payer: Medicaid Other | Attending: Emergency Medicine | Admitting: Emergency Medicine

## 2013-10-26 ENCOUNTER — Encounter (HOSPITAL_COMMUNITY): Payer: Self-pay | Admitting: Emergency Medicine

## 2013-10-26 DIAGNOSIS — E1149 Type 2 diabetes mellitus with other diabetic neurological complication: Secondary | ICD-10-CM | POA: Insufficient documentation

## 2013-10-26 DIAGNOSIS — S60459A Superficial foreign body of unspecified finger, initial encounter: Secondary | ICD-10-CM

## 2013-10-26 DIAGNOSIS — J4489 Other specified chronic obstructive pulmonary disease: Secondary | ICD-10-CM | POA: Insufficient documentation

## 2013-10-26 DIAGNOSIS — Z862 Personal history of diseases of the blood and blood-forming organs and certain disorders involving the immune mechanism: Secondary | ICD-10-CM | POA: Insufficient documentation

## 2013-10-26 DIAGNOSIS — W268XXA Contact with other sharp object(s), not elsewhere classified, initial encounter: Secondary | ICD-10-CM | POA: Insufficient documentation

## 2013-10-26 DIAGNOSIS — F3289 Other specified depressive episodes: Secondary | ICD-10-CM | POA: Insufficient documentation

## 2013-10-26 DIAGNOSIS — N400 Enlarged prostate without lower urinary tract symptoms: Secondary | ICD-10-CM | POA: Insufficient documentation

## 2013-10-26 DIAGNOSIS — Y929 Unspecified place or not applicable: Secondary | ICD-10-CM | POA: Insufficient documentation

## 2013-10-26 DIAGNOSIS — Z923 Personal history of irradiation: Secondary | ICD-10-CM | POA: Insufficient documentation

## 2013-10-26 DIAGNOSIS — Z85118 Personal history of other malignant neoplasm of bronchus and lung: Secondary | ICD-10-CM | POA: Insufficient documentation

## 2013-10-26 DIAGNOSIS — E1142 Type 2 diabetes mellitus with diabetic polyneuropathy: Secondary | ICD-10-CM | POA: Insufficient documentation

## 2013-10-26 DIAGNOSIS — Z79899 Other long term (current) drug therapy: Secondary | ICD-10-CM | POA: Insufficient documentation

## 2013-10-26 DIAGNOSIS — F17200 Nicotine dependence, unspecified, uncomplicated: Secondary | ICD-10-CM | POA: Insufficient documentation

## 2013-10-26 DIAGNOSIS — Z87448 Personal history of other diseases of urinary system: Secondary | ICD-10-CM | POA: Insufficient documentation

## 2013-10-26 DIAGNOSIS — I10 Essential (primary) hypertension: Secondary | ICD-10-CM | POA: Insufficient documentation

## 2013-10-26 DIAGNOSIS — F329 Major depressive disorder, single episode, unspecified: Secondary | ICD-10-CM | POA: Insufficient documentation

## 2013-10-26 DIAGNOSIS — Y939 Activity, unspecified: Secondary | ICD-10-CM | POA: Insufficient documentation

## 2013-10-26 DIAGNOSIS — Z8701 Personal history of pneumonia (recurrent): Secondary | ICD-10-CM | POA: Insufficient documentation

## 2013-10-26 DIAGNOSIS — J449 Chronic obstructive pulmonary disease, unspecified: Secondary | ICD-10-CM | POA: Insufficient documentation

## 2013-10-26 DIAGNOSIS — F411 Generalized anxiety disorder: Secondary | ICD-10-CM | POA: Insufficient documentation

## 2013-10-26 DIAGNOSIS — G8929 Other chronic pain: Secondary | ICD-10-CM | POA: Insufficient documentation

## 2013-10-26 NOTE — ED Notes (Signed)
Md Walden at bedside.  

## 2013-10-26 NOTE — ED Notes (Signed)
Pt reports getting a piece of metal in right pinkie. States he removed part of it, but still feels like there is a piece in his finger. Reports pain 1/10. Unsure of last tetanus shot. No redness, streaking, s/s infection noted.

## 2013-10-26 NOTE — ED Provider Notes (Signed)
CSN: [id number]     Arrival date & time 10/26/13  1136 History   First MD Initiated Contact with Patient 10/26/13 1204     Chief Complaint  Patient presents with  . Foreign Body in Skin   (Consider location/radiation/quality/duration/timing/severity/associated sxs/prior Treatment) HPI  57 year old male here with foreign body in leftt fifth digit the last 2 weeks. States that it happened after picking a piece of steel and created a wound. Time is able to remove most of the metal but feels that he can get all because he got a bump where the wound has healed. He denies fevers, chills, nausea, vomiting, diarrhea, pain at the site, redness or itching.   Past Medical History  Diagnosis Date  . Back pain, chronic   . Mediastinal mass   . Urinary retention   . Lung cancer 04/07/2013  . Diabetic neuropathy   . Diabetes mellitus, type II     takes Gimepimide and Metformin daily  . Enlarged prostate     takes Flomax every evening  . Hypertension     slightly elevated recently;no meds;just watching for now  . COPD (chronic obstructive pulmonary disease)     emphysema  . Shortness of breath     with exertion  . Pneumonia     hx of;last time about 47yrs ago  . History of bronchitis     last time 91yrs ago  . Joint pain     stiffness and cramping in both hands  . Urinary frequency   . Nocturia   . Anemia     no meds and hasn't had a transfusion ever  . Anxiety   . Depression     no meds required;hospitatlized 36yrs ago for this  . Hx of radiation therapy 04/19/13- 06/07/13    left upper central chest, 63 gray 35 fx  . Heart murmur     as a baby  . Diarrhea   . Neuropathy    Past Surgical History  Procedure Laterality Date  . Video bronchoscopy with endobronchial ultrasound Bilateral 03/10/2013    Procedure: VIDEO BRONCHOSCOPY WITH ENDOBRONCHIAL ULTRASOUND;  Surgeon: Oretha Milch, MD;  Location: [facility] OR;  Service: Pulmonary;  Laterality: Bilateral;  . Mediastinotomy chamberlain [email or web address]  Left 03/24/2013    Procedure: MEDIASTINOTOMY CHAMBERLAIN MCNEIL;  Surgeon: Loreli Slot, MD;  Location: [facility] OR;  Service: Thoracic;  Laterality: Left;  . Excisional hemorrhoidectomy      > 30 yrs ago  . Colonoscopy    . Direct laryngoscopy N/A 07/01/2013    Procedure: DIRECT LARYNGOSCOPY;  Surgeon: Serena Colonel, MD;  Location: [facility] OR;  Service: ENT;  Laterality: N/A;  . Esophagoscopy N/A 07/01/2013    Procedure: ESOPHAGOSCOPY WITH BIOPSY;  Surgeon: Serena Colonel, MD;  Location: [facility] OR;  Service: ENT;  Laterality: N/A;  . Direct laryngoscopy N/A 08/01/2013    Procedure: DIRECT LARYNGOSCOPY WITH EXCISION OF PHARYNGEAL MASS;  Surgeon: Serena Colonel, MD;  Location: [facility] OR;  Service: ENT;  Laterality: N/A;  . Tonsillectomy Right 08/01/2013    Procedure: TONSILLECTOMY;  Surgeon: Serena Colonel, MD;  Location: [facility] OR;  Service: ENT;  Laterality: Right;   Family History  Problem Relation Age of Onset  . Aneurysm Mother   . Heart attack Father   . Aneurysm Sister   . Cancer Brother    History  Substance Use Topics  . Smoking status: Current Every Day Smoker -- 0.50 packs/day for 41 years    Types: Cigarettes    Start date: 07/18/1971  .  Smokeless tobacco: Not on file     Comment: nicotene patch  . Alcohol Use: 8.4 oz/week    14 Cans of beer per week     Comment: daily beer 2    Review of Systems  Constitutional: Negative for fever, chills and diaphoresis.  HENT: Negative for sore throat.   Eyes: Negative for visual disturbance.  Respiratory: Negative for cough and shortness of breath.   Gastrointestinal: Negative for nausea, vomiting, abdominal pain and diarrhea.  Genitourinary: Negative for dysuria.  Musculoskeletal: Negative for back pain.  Skin: Negative for rash.  Neurological: Positive for numbness. Negative for headaches.    Allergies  Review of patient's allergies indicates no known allergies.  Home Medications   Current Outpatient Rx  Name  Route  Sig  Dispense  Refill  .  Artificial Saliva (BIOTENE ORALBALANCE DRY MOUTH) LIQD   Mouth/Throat   Use as directed 5 mLs in the mouth or throat 2 (two) times daily.   1 Bottle   3   . gabapentin (NEURONTIN) 300 MG capsule   Oral   Take 300 mg by mouth 3 (three) times daily.         Marland Kitchen glimepiride (AMARYL) 1 MG tablet   Oral   Take 1 tablet (1 mg total) by mouth daily before breakfast.   30 tablet   3   . Multiple Vitamin (MULTIVITAMIN WITH MINERALS) TABS   Oral   Take 1 tablet by mouth daily.         . nicotine (NICODERM CQ - DOSED IN MG/24 HR) 7 mg/24hr patch   Transdermal   Place 1 patch onto the skin daily.          BP 144/84  Pulse 65  Temp(Src) 97.7 F (36.5 C) (Oral)  Resp 18  Ht 6\' 3"  (1.905 m)  Wt 230 lb 6.4 oz (104.509 kg)  BMI 28.80 kg/m2  SpO2 93% Physical Exam  Constitutional: He is oriented to person, place, and time. He appears well-developed and well-nourished. No distress.  HENT:  Head: Normocephalic and atraumatic.  Eyes: EOM are normal. Pupils are equal, round, and reactive to light.  Neck: Normal range of motion. Neck supple.  Cardiovascular: Normal rate, regular rhythm and normal heart sounds.   Pulmonary/Chest: Effort normal and breath sounds normal. No respiratory distress. He has no wheezes.  Abdominal: Soft. Bowel sounds are normal. There is no tenderness.  Musculoskeletal: He exhibits no edema.  L 5th digit with 4-5 mm non tender flesh colored papule without surrounding erythema, induration, or warmth.   Neurological: He is alert and oriented to person, place, and time.  Skin: Skin is warm and dry. He is not diaphoretic.  Psychiatric: He has a normal mood and affect.    ED Course  Procedures (including critical care time) Labs Review Labs Reviewed - No data to display Imaging Review No results found.  EKG Interpretation   None       MDM   1. Foreign body in skin of finger, initial encounter    Discussed with patient options for treatment including  watchful waiting and unroofing to remove FB Patient wants it removed, however he left AMA before we could perform procedure  Murtis Sink, MD [facility] Family Medicine Resident, PGY-2 10/26/2013, 1:34 PM       Elenora Gamma, MD 10/26/13 1335

## 2013-10-26 NOTE — ED Notes (Signed)
Pt c/o piece of steel in left pinky finger x 1 week with some swelling noted

## 2013-10-26 NOTE — ED Provider Notes (Signed)
I saw and evaluated the patient, reviewed the resident's note and I agree with the findings and plan.  EKG Interpretation   None       FB in L pinky. Offered removal, he accepted, however left prior to [place] getting our materials together. Patient NVI, no need for urgent removal.   Dagmar Hait, MD 10/26/13 [phone]

## 2013-10-26 NOTE — ED Notes (Signed)
Resident at bedside.  

## 2013-11-01 ENCOUNTER — Telehealth: Payer: Self-pay | Admitting: Pulmonary Disease

## 2013-11-01 NOTE — Telephone Encounter (Signed)
LM to schedule apt x3. No return call back. Sent letter 11/01/13.  °

## 2013-11-03 ENCOUNTER — Encounter: Payer: Self-pay | Admitting: Radiation Oncology

## 2013-11-03 ENCOUNTER — Ambulatory Visit
Admission: RE | Admit: 2013-11-03 | Discharge: 2013-11-03 | Disposition: A | Discharge status: Home / Self Care | Payer: Medicaid Other | Source: Ambulatory Visit | Attending: Radiation Oncology | Admitting: Radiation Oncology

## 2013-11-03 NOTE — Progress Notes (Signed)
  Radiation Oncology         [phone] ________________________________  Name: Rodney Powers MRN: [id number]  Date: 11/03/2013  DOB: 1956-07-31  Follow-Up Visit Note  CC: Jeanann Lewandowsky, MD  Kerin Perna, MD  Diagnosis:   Stage III-B non-small cell lung cancer   Interval Since Last Radiation:  5  months  Narrative:  The patient returns today for routine follow-up.  He is doing reasonably well at this time. He does have some mild dyspnea with exertion. He denies any pain within the chest area or hemoptysis. He denies any new bony pain,  headaches dizziness or blurred vision. His initial post treatment CT scan showed significant response to his therapy.                                ALLERGIES:  has No Known Allergies.  Meds: Current Outpatient Prescriptions  Medication Sig Dispense Refill  . gabapentin (NEURONTIN) 300 MG capsule Take 300 mg by mouth 3 (three) times daily.      Marland Kitchen glimepiride (AMARYL) 1 MG tablet Take 1 tablet (1 mg total) by mouth daily before breakfast.  30 tablet  3  . Artificial Saliva (BIOTENE ORALBALANCE DRY MOUTH) LIQD Use as directed 5 mLs in the mouth or throat 2 (two) times daily.  1 Bottle  3  . Multiple Vitamin (MULTIVITAMIN WITH MINERALS) TABS Take 1 tablet by mouth daily.      . nicotine (NICODERM CQ - DOSED IN MG/24 HR) 7 mg/24hr patch Place 1 patch onto the skin daily.       No current facility-administered medications for this encounter.    Physical Findings: The patient is in no acute distress. Patient is alert and oriented.  height is 6\' 3"  (1.905 m) and weight is 235 lb (106.595 kg). His temperature is 97.6 F (36.4 C). His blood pressure is 153/82 and his pulse is 78. His oxygen saturation is 94%. .  No palpable supraclavicular or axillary adenopathy. The lungs are clear to auscultation. The heart has a regular rhythm and rate   Lab Findings: Lab Results  Component Value Date   WBC 3.6* 08/05/2013   HGB 15.4 08/05/2013   HCT  46.5 08/05/2013   MCV 83.9 08/05/2013   PLT 240 08/05/2013      Radiographic Findings: No results found.  Impression:    Good response to therapy on initial post treatment CT scan.   Plan:   when necessary followup in radiation oncology. Patient will undergo a repeat chest CT scan later this month and then followup with Dr. Bertis Ruddy.  _____________________________________ -----------------------------------  Billie Lade, PhD, MD

## 2013-11-03 NOTE — Progress Notes (Signed)
Rodney Powers here for follow up after treatment for lung cancer.  He denies pain but had some stomach pain he thinks was due to diverticulosis yesterday.  He has "a little bit" of shortness of breath with activity.  He said his oxygen saturation was 87% last Friday in the ER to have a sliver removed from his finger.  Today it is 94% on room air.  He has a dry cough mostly in the mornings.  He denies fatigue.  He reports that his skin is intact in the treatment area.

## 2013-11-07 ENCOUNTER — Ambulatory Visit: Payer: Medicaid Other

## 2013-11-09 ENCOUNTER — Ambulatory Visit (HOSPITAL_COMMUNITY)
Admission: RE | Admit: 2013-11-09 | Discharge: 2013-11-09 | Disposition: A | Discharge status: Home / Self Care | Payer: Medicaid Other | Source: Ambulatory Visit | Attending: Hematology and Oncology | Admitting: Hematology and Oncology

## 2013-11-09 ENCOUNTER — Other Ambulatory Visit (HOSPITAL_BASED_OUTPATIENT_CLINIC_OR_DEPARTMENT_OTHER): Payer: Medicaid Other | Admitting: Lab

## 2013-11-09 ENCOUNTER — Other Ambulatory Visit: Payer: Self-pay | Admitting: Hematology and Oncology

## 2013-11-09 ENCOUNTER — Encounter (HOSPITAL_COMMUNITY): Payer: Self-pay

## 2013-11-09 DIAGNOSIS — R918 Other nonspecific abnormal finding of lung field: Secondary | ICD-10-CM | POA: Insufficient documentation

## 2013-11-09 DIAGNOSIS — C3490 Malignant neoplasm of unspecified part of unspecified bronchus or lung: Secondary | ICD-10-CM | POA: Insufficient documentation

## 2013-11-09 DIAGNOSIS — I2510 Atherosclerotic heart disease of native coronary artery without angina pectoris: Secondary | ICD-10-CM | POA: Insufficient documentation

## 2013-11-09 DIAGNOSIS — J438 Other emphysema: Secondary | ICD-10-CM | POA: Insufficient documentation

## 2013-11-09 DIAGNOSIS — K7689 Other specified diseases of liver: Secondary | ICD-10-CM | POA: Insufficient documentation

## 2013-11-09 DIAGNOSIS — R599 Enlarged lymph nodes, unspecified: Secondary | ICD-10-CM | POA: Insufficient documentation

## 2013-11-09 DIAGNOSIS — C3410 Malignant neoplasm of upper lobe, unspecified bronchus or lung: Secondary | ICD-10-CM

## 2013-11-09 LAB — COMPREHENSIVE METABOLIC PANEL (CC13)
ALT: 16 U/L (ref 0–55)
AST: 17 U/L (ref 5–34)
Albumin: 3.6 g/dL (ref 3.5–5.0)
Anion Gap: 11 mEq/L (ref 3–11)
BUN: 8.1 mg/dL (ref 7.0–26.0)
CO2: 21 mEq/L — ABNORMAL LOW (ref 22–29)
Calcium: 9.6 mg/dL (ref 8.4–10.4)
Chloride: 109 mEq/L (ref 98–109)
Potassium: 4 mEq/L (ref 3.5–5.1)

## 2013-11-09 LAB — CBC WITH DIFFERENTIAL/PLATELET
BASO%: 1 % (ref 0.0–2.0)
EOS%: 2.4 % (ref 0.0–7.0)
MCH: 26 pg — ABNORMAL LOW (ref 27.2–33.4)
MCV: 80.5 fL (ref 79.3–98.0)
MONO%: 10.5 % (ref 0.0–14.0)
RBC: 5.73 10*6/uL (ref 4.20–5.82)
RDW: 14.7 % — ABNORMAL HIGH (ref 11.0–14.6)

## 2013-11-09 MED ORDER — IOHEXOL 300 MG/ML  SOLN
80.0000 mL | Freq: Once | INTRAMUSCULAR | Status: AC | PRN
  Administered 2013-11-09: 80 mL via INTRAVENOUS

## 2013-11-14 ENCOUNTER — Telehealth: Payer: Self-pay | Admitting: Hematology and Oncology

## 2013-11-14 ENCOUNTER — Encounter: Payer: Self-pay | Admitting: Hematology and Oncology

## 2013-11-14 ENCOUNTER — Ambulatory Visit (HOSPITAL_BASED_OUTPATIENT_CLINIC_OR_DEPARTMENT_OTHER): Payer: Medicaid Other | Admitting: Hematology and Oncology

## 2013-11-14 DIAGNOSIS — R05 Cough: Secondary | ICD-10-CM

## 2013-11-14 DIAGNOSIS — M549 Dorsalgia, unspecified: Secondary | ICD-10-CM

## 2013-11-14 DIAGNOSIS — C3410 Malignant neoplasm of upper lobe, unspecified bronchus or lung: Secondary | ICD-10-CM

## 2013-11-14 DIAGNOSIS — F17200 Nicotine dependence, unspecified, uncomplicated: Secondary | ICD-10-CM

## 2013-11-14 DIAGNOSIS — M545 Low back pain: Secondary | ICD-10-CM

## 2013-11-14 DIAGNOSIS — M546 Pain in thoracic spine: Secondary | ICD-10-CM | POA: Insufficient documentation

## 2013-11-14 DIAGNOSIS — R911 Solitary pulmonary nodule: Secondary | ICD-10-CM

## 2013-11-14 HISTORY — DX: Dorsalgia, unspecified: M54.9

## 2013-11-14 MED ORDER — VITAMIN D 50 MCG (2000 UT) PO TABS: 2000.0000 [IU] | ORAL_TABLET | Freq: Every day | ORAL | Status: DC

## 2013-11-14 MED ORDER — TRAMADOL HCL 50 MG PO TABS: 50.0000 mg | ORAL_TABLET | Freq: Four times a day (QID) | ORAL | Status: DC | PRN

## 2013-11-14 NOTE — Telephone Encounter (Signed)
appt made AVS and CAL given from 12/1 POF shh

## 2013-11-14 NOTE — Progress Notes (Signed)
[facility] OFFICE PROGRESS NOTE  Patient Care Team: Jeanann Lewandowsky, MD as PCP - General (Internal Medicine) Oretha Milch, MD as Attending Physician (Pulmonary Disease) Eddie North, MD as Attending Physician (Internal Medicine) Artis Delay, MD as Consulting Physician (Hematology and Oncology)  DIAGNOSIS: Squamous cell carcinoma of the left lung, status post treatment  SUMMARY OF ONCOLOGIC HISTORY: This is a 57 year old male who was discovered to have lung cancer after presentation with dysphagia. He completed Chemoradiation with weekly chemo Carboplatin/Taxol and daily XRT  (04/18/2013-05/27/2013). In July of 2014, he had repeat laryngoscope be EGD and biopsy and tonsillectomy which showed no evidence of cancer  INTERVAL HISTORY:  Rodney Powers 57 y.o. male returns for regular follow up visit He complained of intermittent back pain. This is centered in the lower back area. He denies any neurological deficit. Denies any further dysphagia. He has chronic cough but no hemoptysis. The cough is nonproductive. The patient continued to smoke   I have reviewed the past medical history, past surgical history, social history and family history with the patient and they are unchanged from previous note.  ALLERGIES:  has No Known Allergies.  MEDICATIONS:  Current Outpatient Prescriptions  Medication Sig Dispense Refill  . Artificial Saliva (BIOTENE ORALBALANCE DRY MOUTH) LIQD Use as directed 5 mLs in the mouth or throat 2 (two) times daily.  1 Bottle  3  . gabapentin (NEURONTIN) 300 MG capsule Take 300 mg by mouth 3 (three) times daily.      Marland Kitchen glimepiride (AMARYL) 1 MG tablet Take 1 tablet (1 mg total) by mouth daily before breakfast.  30 tablet  3  . Multiple Vitamin (MULTIVITAMIN WITH MINERALS) TABS Take 1 tablet by mouth daily.      . nicotine (NICODERM CQ - DOSED IN MG/24 HR) 7 mg/24hr patch Place 1 patch onto the skin daily.      . Cholecalciferol (VITAMIN D) 2000  UNITS tablet Take 1 tablet (2,000 Units total) by mouth daily.  30 tablet  5  . traMADol (ULTRAM) 50 MG tablet Take 1 tablet (50 mg total) by mouth every 6 (six) hours as needed.  60 tablet  0   No current facility-administered medications for this visit.    REVIEW OF SYSTEMS:   Constitutional: Denies fevers, chills or abnormal weight loss Eyes: Denies blurriness of vision Ears, nose, mouth, throat, and face: Denies mucositis or sore throat Respiratory: Denies cough, dyspnea or wheezes Cardiovascular: Denies palpitation, chest discomfort or lower extremity swelling Gastrointestinal:  Denies nausea, heartburn or change in bowel habits Skin: Denies abnormal skin rashes Lymphatics: Denies new lymphadenopathy or easy bruising Neurological:Denies numbness, tingling or new weaknesses Behavioral/Psych: Mood is stable, no new changes  All other systems were reviewed with the patient and are negative.  PHYSICAL EXAMINATION: ECOG PERFORMANCE STATUS: 0 - Asymptomatic  Filed Vitals:   11/14/13 1406  BP: 136/74  Pulse: 100  Temp: 98 F (36.7 C)  Resp: 18   Filed Weights   11/14/13 1406  Weight: 232 lb 6.4 oz (105.416 kg)    GENERAL:alert, no distress and comfortable SKIN: skin color, texture, turgor are normal, no rashes or significant lesions EYES: normal, Conjunctiva are pink and non-injected, sclera clear OROPHARYNX:no exudate, no erythema and lips, buccal mucosa, and tongue normal  NECK: supple, thyroid normal size, non-tender, without nodularity LYMPH:  no palpable lymphadenopathy in the cervical, axillary or inguinal LUNGS: clear to auscultation and percussion with normal breathing effort HEART: regular rate & rhythm and no  murmurs and no lower extremity edema ABDOMEN:abdomen soft, non-tender and normal bowel sounds Musculoskeletal:no cyanosis of digits and no clubbing  NEURO: alert & oriented x 3 with fluent speech, no focal motor/sensory deficits  LABORATORY DATA:  I have  reviewed the data as listed    Component Value Date/Time   NA 142 11/09/2013 0855   NA 141 08/01/2013 0640   K 4.0 11/09/2013 0855   K 4.0 08/01/2013 0640   CL 107 08/01/2013 0640   CL 104 06/06/2013 0841   CO2 21* 11/09/2013 0855   CO2 22 08/01/2013 0640   GLUCOSE 136 11/09/2013 0855   GLUCOSE 128* 08/01/2013 0640   GLUCOSE 258* 06/06/2013 0841   BUN 8.1 11/09/2013 0855   BUN 9 08/01/2013 0640   CREATININE 1.1 11/09/2013 0855   CREATININE 1.01 08/01/2013 0640   CALCIUM 9.6 11/09/2013 0855   CALCIUM 9.9 08/01/2013 0640   PROT 7.1 11/09/2013 0855   PROT 7.3 03/23/2013 1229   ALBUMIN 3.6 11/09/2013 0855   ALBUMIN 2.5* 03/23/2013 1229   AST 17 11/09/2013 0855   AST 30 03/23/2013 1229   ALT 16 11/09/2013 0855   ALT 70* 03/23/2013 1229   ALKPHOS 94 11/09/2013 0855   ALKPHOS 103 03/23/2013 1229   BILITOT 0.58 11/09/2013 0855   BILITOT 0.2* 03/23/2013 1229   GFRNONAA 81* 08/01/2013 0640   GFRAA >90 08/01/2013 0640    No results found for this basename: SPEP,  UPEP,   kappa and lambda light chains    Lab Results  Component Value Date   WBC 4.7 11/09/2013   NEUTROABS 3.2 11/09/2013   HGB 14.9 11/09/2013   HCT 46.2 11/09/2013   MCV 80.5 11/09/2013   PLT 221 11/09/2013      Chemistry      Component Value Date/Time   NA 142 11/09/2013 0855   NA 141 08/01/2013 0640   K 4.0 11/09/2013 0855   K 4.0 08/01/2013 0640   CL 107 08/01/2013 0640   CL 104 06/06/2013 0841   CO2 21* 11/09/2013 0855   CO2 22 08/01/2013 0640   BUN 8.1 11/09/2013 0855   BUN 9 08/01/2013 0640   CREATININE 1.1 11/09/2013 0855   CREATININE 1.01 08/01/2013 0640      Component Value Date/Time   CALCIUM 9.6 11/09/2013 0855   CALCIUM 9.9 08/01/2013 0640   ALKPHOS 94 11/09/2013 0855   ALKPHOS 103 03/23/2013 1229   AST 17 11/09/2013 0855   AST 30 03/23/2013 1229   ALT 16 11/09/2013 0855   ALT 70* 03/23/2013 1229   BILITOT 0.58 11/09/2013 0855   BILITOT 0.2* 03/23/2013 1229      RADIOGRAPHIC STUDIES: I have personally reviewed the  radiological images as listed and agreed with the findings in the report. I reviewed his most recent CT scan we show multiple new lung nodules and significant radiation-induced lung changes. The lung nodule suspicious for possible malignancy   ASSESSMENT & PLAN:  #1 lung cancer Concern about the new lung nodules. There are too small to biopsy. PET/CT scan is unlikely to be helpful. I recommend repeat CT scan in 3 months. #2 lung nodules As mentioned above we will monitored carefully. #3 significant back pain I recommend vitamin D supplements. I recommend ordering a bone scan to rule out bone metastasis. I gave him a small prescription of tramadol. I recommend you contact his primary care provider for pain medication #4 smoking I discussed with him the importance of nicotine cessation. The patient wants to  attempt to quit by himself.  Orders Placed This Encounter  Procedures  . NM Bone Scan Whole Body    Standing Status: Future     Number of Occurrences:      Standing Expiration Date: 11/14/2014    Order Specific Question:  Reason for exam:    Answer:  lung cancer, back pain, r/o bone mets    Order Specific Question:  Preferred imaging location?    Answer:  [facility]   All questions were answered. The patient knows to call the clinic with any problems, questions or concerns. No barriers to learning was detected.    [facility], Naydeline Morace, MD 11/14/2013 2:31 PM

## 2013-11-23 ENCOUNTER — Other Ambulatory Visit: Payer: Self-pay | Admitting: *Deleted

## 2013-11-23 ENCOUNTER — Other Ambulatory Visit: Payer: Self-pay | Admitting: Hematology and Oncology

## 2013-11-24 ENCOUNTER — Telehealth: Payer: Self-pay | Admitting: Hematology and Oncology

## 2013-11-24 NOTE — Telephone Encounter (Signed)
lvm for pt regarding to 12.15 appt moved to 12.23 per MD request...mailed pt avs and letter

## 2013-11-28 ENCOUNTER — Ambulatory Visit: Payer: Medicaid Other | Admitting: Hematology and Oncology

## 2013-12-02 ENCOUNTER — Encounter (HOSPITAL_COMMUNITY)
Admission: RE | Admit: 2013-12-02 | Discharge: 2013-12-02 | Disposition: A | Discharge status: Home / Self Care | Payer: Medicaid Other | Source: Ambulatory Visit | Attending: Hematology and Oncology | Admitting: Hematology and Oncology

## 2013-12-02 DIAGNOSIS — C3490 Malignant neoplasm of unspecified part of unspecified bronchus or lung: Secondary | ICD-10-CM | POA: Insufficient documentation

## 2013-12-02 MED ORDER — TECHNETIUM TC 99M MEDRONATE IV KIT
25.4000 | PACK | Freq: Once | INTRAVENOUS | Status: AC | PRN
  Administered 2013-12-02: 25.4 via INTRAVENOUS

## 2013-12-05 ENCOUNTER — Other Ambulatory Visit: Payer: Self-pay | Admitting: Internal Medicine

## 2013-12-05 ENCOUNTER — Telehealth: Payer: Self-pay | Admitting: Hematology and Oncology

## 2013-12-05 ENCOUNTER — Telehealth: Payer: Self-pay | Admitting: *Deleted

## 2013-12-05 NOTE — Telephone Encounter (Signed)
Yes. Thanks 

## 2013-12-05 NOTE — Telephone Encounter (Signed)
MD appt was for tomorrow,  Sorry not today.

## 2013-12-05 NOTE — Telephone Encounter (Signed)
per 12/22 pof r/s 12/23 appt to 1/6. s/w relative re new d/t.

## 2013-12-05 NOTE — Telephone Encounter (Signed)
Called pt back and informed him of results of Bone Scan were normal,  No evidence of cancer in his bones.  He verbalized understanding.

## 2013-12-05 NOTE — Telephone Encounter (Signed)
Pt states unable to make his appt w/ Dr. Bertis Ruddy today due to transportation issues.  He asks is he can r/s to after the first of the year?  He also asks if results of Bone Scan be given by phone?    Informed we will r/s to the first of the year unless Dr. Bertis Ruddy feels pt needs to be seen sooner.  Scheduling will call with new appt.. Pt verbalized understanding.

## 2013-12-06 ENCOUNTER — Ambulatory Visit: Payer: Medicaid Other | Admitting: Hematology and Oncology

## 2013-12-13 ENCOUNTER — Telehealth: Payer: Self-pay

## 2013-12-20 ENCOUNTER — Telehealth: Payer: Self-pay | Admitting: Hematology and Oncology

## 2013-12-20 ENCOUNTER — Telehealth: Payer: Self-pay

## 2013-12-20 ENCOUNTER — Encounter: Payer: Self-pay | Admitting: Hematology and Oncology

## 2013-12-20 ENCOUNTER — Encounter (INDEPENDENT_AMBULATORY_CARE_PROVIDER_SITE_OTHER): Payer: Self-pay

## 2013-12-20 ENCOUNTER — Ambulatory Visit (HOSPITAL_BASED_OUTPATIENT_CLINIC_OR_DEPARTMENT_OTHER): Payer: Medicaid Other | Admitting: Hematology and Oncology

## 2013-12-20 DIAGNOSIS — C3490 Malignant neoplasm of unspecified part of unspecified bronchus or lung: Secondary | ICD-10-CM

## 2013-12-20 DIAGNOSIS — F17200 Nicotine dependence, unspecified, uncomplicated: Secondary | ICD-10-CM

## 2013-12-20 DIAGNOSIS — M549 Dorsalgia, unspecified: Secondary | ICD-10-CM

## 2013-12-20 DIAGNOSIS — R918 Other nonspecific abnormal finding of lung field: Secondary | ICD-10-CM

## 2013-12-20 NOTE — Progress Notes (Signed)
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 He completed Chemoradiation with weekly chemo Carboplatin/Taxol and daily XRT  (04/18/2013-05/27/2013). In July of 2014, he had repeat laryngoscope be EGD and biopsy and tonsillectomy which showed no evidence of cancer  INTERVAL HISTORY: Rodney Powers 58 y.o. male returns for further followup. Since last time I saw him, he is to have purposes.chronic back pain. He has very mild cough but no hemoptysis. The patient continued to smoke but attempting to quit smoking.  I have reviewed the past medical history, past surgical history, social history and family history with the patient and they are unchanged from previous note.  ALLERGIES:  has No Known Allergies.  MEDICATIONS:  Current Outpatient Prescriptions  Medication Sig Dispense Refill  . Artificial Saliva (BIOTENE ORALBALANCE DRY MOUTH) LIQD Use as directed 5 mLs in the mouth or throat 2 (two) times daily.  1 Bottle  3  . Cholecalciferol (VITAMIN D) 2000 UNITS tablet Take 1 tablet (2,000 Units total) by mouth daily.  30 tablet  5  . gabapentin (NEURONTIN) 300 MG capsule Take 300 mg by mouth 3 (three) times daily.      Marland Kitchen glimepiride (AMARYL) 1 MG tablet Take 1 tablet (1 mg total) by mouth daily before breakfast.  30 tablet  3  . Multiple Vitamin (MULTIVITAMIN WITH MINERALS) TABS Take 1 tablet by mouth daily.      . traMADol (ULTRAM) 50 MG tablet Take 1 tablet (50 mg total) by mouth every 6 (six) hours as needed.  60 tablet  0  .  nicotine (NICODERM CQ - DOSED IN MG/24 HR) 7 mg/24hr patch Place 1 patch onto the skin daily.       No current facility-administered medications for this visit.    REVIEW OF SYSTEMS:   Constitutional: Denies fevers, chills or abnormal weight loss Eyes: Denies blurriness of vision Ears, nose, mouth, throat, and face: Denies mucositis or sore throat Cardiovascular: Denies palpitation, chest discomfort or lower extremity swelling Gastrointestinal:  Denies nausea, heartburn or change in bowel habits Skin: Denies abnormal skin rashes Lymphatics: Denies new lymphadenopathy or easy bruising Neurological:Denies numbness, tingling or new weaknesses Behavioral/Psych: Mood is stable, no new changes  All other systems were reviewed with the patient and are negative.  PHYSICAL EXAMINATION: ECOG PERFORMANCE STATUS: 1 - Symptomatic but completely ambulatory  Filed Vitals:   12/20/13 1415  BP: 136/86  Pulse: 112  Temp: 98.4 F (36.9 C)  Resp: 18   Filed Weights   12/20/13 1415  Weight: 235 lb 6.4 oz (106.777 kg)    GENERAL:alert, no distress and comfortable SKIN: skin color, texture, turgor are normal, no rashes or significant lesions Musculoskeletal:no cyanosis of digits and no clubbing  NEURO: alert & oriented x 3 with fluent speech, no focal motor/sensory deficits  LABORATORY DATA:  I have reviewed the data as listed    Component Value Date/Time   NA 142 11/09/2013 0855   NA 141 08/01/2013 0640   K 4.0 11/09/2013 0855   K 4.0 08/01/2013 0640   CL 107 08/01/2013 0640   CL 104 06/06/2013 0841  CO2 21* 11/09/2013 0855   CO2 22 08/01/2013 0640   GLUCOSE 136 11/09/2013 0855   GLUCOSE 128* 08/01/2013 0640   GLUCOSE 258* 06/06/2013 0841   BUN 8.1 11/09/2013 0855   BUN 9 08/01/2013 0640   CREATININE 1.1 11/09/2013 0855   CREATININE 1.01 08/01/2013 0640   CALCIUM 9.6 11/09/2013 0855   CALCIUM 9.9 08/01/2013 0640   PROT 7.1 11/09/2013 0855   PROT 7.3 03/23/2013 1229   ALBUMIN 3.6  11/09/2013 0855   ALBUMIN 2.5* 03/23/2013 1229   AST 17 11/09/2013 0855   AST 30 03/23/2013 1229   ALT 16 11/09/2013 0855   ALT 70* 03/23/2013 1229   ALKPHOS 94 11/09/2013 0855   ALKPHOS 103 03/23/2013 1229   BILITOT 0.58 11/09/2013 0855   BILITOT 0.2* 03/23/2013 1229   GFRNONAA 81* 08/01/2013 0640   GFRAA >90 08/01/2013 0640    No results found for this basename: SPEP,  UPEP,   kappa and lambda light chains    Lab Results  Component Value Date   WBC 4.7 11/09/2013   NEUTROABS 3.2 11/09/2013   HGB 14.9 11/09/2013   HCT 46.2 11/09/2013   MCV 80.5 11/09/2013   PLT 221 11/09/2013      Chemistry      Component Value Date/Time   NA 142 11/09/2013 0855   NA 141 08/01/2013 0640   K 4.0 11/09/2013 0855   K 4.0 08/01/2013 0640   CL 107 08/01/2013 0640   CL 104 06/06/2013 0841   CO2 21* 11/09/2013 0855   CO2 22 08/01/2013 0640   BUN 8.1 11/09/2013 0855   BUN 9 08/01/2013 0640   CREATININE 1.1 11/09/2013 0855   CREATININE 1.01 08/01/2013 0640      Component Value Date/Time   CALCIUM 9.6 11/09/2013 0855   CALCIUM 9.9 08/01/2013 0640   ALKPHOS 94 11/09/2013 0855   ALKPHOS 103 03/23/2013 1229   AST 17 11/09/2013 0855   AST 30 03/23/2013 1229   ALT 16 11/09/2013 0855   ALT 70* 03/23/2013 1229   BILITOT 0.58 11/09/2013 0855   BILITOT 0.2* 03/23/2013 1229     RADIOGRAPHIC STUDIES: Recent bone scan showed no evidence of metastatic disease to the bone I have personally reviewed the radiological images as listed and agreed with the findings in the report.  ASSESSMENT & PLAN:  #1 lung cancer I recommend repeat CT scan in 3 months. Continue supportive care #2 lung nodules As mentioned above we will monitored carefully. #3 significant back pain I recommend vitamin D supplements.Bone scan to ruled out bone metastasis. I gave him a small prescription of tramadol. I commend he contact his primary care provider for pain medication #4 smoking I discussed with him the importance of nicotine cessation. The  patient wants to attempt to quit by himself. Orders Placed This Encounter  Procedures  . NM PET Image Restag (PS) Skull Base To Thigh    Standing Status: Future     Number of Occurrences:      Standing Expiration Date: 02/19/2015    Order Specific Question:  Reason for Exam (SYMPTOM  OR DIAGNOSIS REQUIRED)    Answer:  Hx lung cancer, residual disease seen on CT? progression    Order Specific Question:  Preferred imaging location?    Answer:  [facility]  . CBC with Differential    Standing Status: Future     Number of Occurrences:      Standing Expiration Date: 09/11/2014  . Comprehensive metabolic panel  Standing Status: Future     Number of Occurrences:      Standing Expiration Date: 12/20/2014   All questions were answered. The patient knows to call the clinic with any problems, questions or concerns. No barriers to learning was detected. I spent 15 minutes counseling the patient face to face. The total time spent in the appointment was 20 minutes and more than 50% was on counseling and review of test results     [facility], [place], MD 12/20/2013 4:53 PM

## 2013-12-20 NOTE — Telephone Encounter (Signed)
lvm for pt regarding to March appts

## 2013-12-21 ENCOUNTER — Telehealth: Payer: Self-pay | Admitting: *Deleted

## 2013-12-21 DIAGNOSIS — E1149 Type 2 diabetes mellitus with other diabetic neurological complication: Secondary | ICD-10-CM

## 2013-12-21 MED ORDER — ACCU-CHEK MULTICLIX LANCETS MISC: Status: DC

## 2013-12-21 NOTE — Telephone Encounter (Signed)
Call from pharmacy states received rx for glucose machine and Medicaid providing Accucheck brand machine.  They also need rx for Aviva lancets and test strips and specify how often pt needs to test.  Rx faxed for Accucheck blood glucose monitoring device and Aviva blood glucose test strips and multi click lancets for pt to check blood sugar TID per Dr. Alvy Bimler.  Faxed to [facility] out pt pharmacy at fax # [phone].

## 2013-12-21 NOTE — Telephone Encounter (Signed)
Pt left VM stated returning call to [facility] regarding his PET scan appt.  Called [employer] and s/w Morey Hummingbird.  Asked her to give message to [facility] to return pt's call on his cell phone number listed.  She agreed.

## 2013-12-21 NOTE — Telephone Encounter (Signed)
Pt left VM returning a call to [facility] in Scheduling regarding his

## 2013-12-29 ENCOUNTER — Ambulatory Visit: Payer: Medicaid Other | Attending: Internal Medicine | Admitting: Internal Medicine

## 2013-12-29 ENCOUNTER — Telehealth: Payer: Self-pay | Admitting: *Deleted

## 2013-12-29 ENCOUNTER — Encounter: Payer: Self-pay | Admitting: Internal Medicine

## 2013-12-29 VITALS — BP 152/79 | HR 79 | Temp 98.7°F | Resp 14 | Ht 75.0 in | Wt 240.0 lb

## 2013-12-29 DIAGNOSIS — E119 Type 2 diabetes mellitus without complications: Secondary | ICD-10-CM | POA: Insufficient documentation

## 2013-12-29 DIAGNOSIS — M549 Dorsalgia, unspecified: Secondary | ICD-10-CM | POA: Insufficient documentation

## 2013-12-29 DIAGNOSIS — G8929 Other chronic pain: Secondary | ICD-10-CM | POA: Insufficient documentation

## 2013-12-29 DIAGNOSIS — C3490 Malignant neoplasm of unspecified part of unspecified bronchus or lung: Secondary | ICD-10-CM | POA: Insufficient documentation

## 2013-12-29 LAB — CBC WITH DIFFERENTIAL/PLATELET
Basophils Absolute: 0 10*3/uL (ref 0.0–0.1)
Basophils Relative: 1 % (ref 0–1)
EOS ABS: 0.1 10*3/uL (ref 0.0–0.7)
Eosinophils Relative: 3 % (ref 0–5)
HCT: 44.7 % (ref 39.0–52.0)
Hemoglobin: 14.6 g/dL (ref 13.0–17.0)
LYMPHS ABS: 0.7 10*3/uL (ref 0.7–4.0)
Lymphocytes Relative: 21 % (ref 12–46)
MCH: 26.3 pg (ref 26.0–34.0)
MCHC: 32.7 g/dL (ref 30.0–36.0)
MCV: 80.5 fL (ref 78.0–100.0)
Monocytes Absolute: 0.3 10*3/uL (ref 0.1–1.0)
Monocytes Relative: 10 % (ref 3–12)
NEUTROS ABS: 2.2 10*3/uL (ref 1.7–7.7)
NEUTROS PCT: 65 % (ref 43–77)
PLATELETS: 219 10*3/uL (ref 150–400)
RBC: 5.55 MIL/uL (ref 4.22–5.81)
RDW: 15.3 % (ref 11.5–15.5)
WBC: 3.4 10*3/uL — ABNORMAL LOW (ref 4.0–10.5)

## 2013-12-29 LAB — COMPLETE METABOLIC PANEL WITH GFR
ALT: 23 U/L (ref 0–53)
AST: 26 U/L (ref 0–37)
Albumin: 4 g/dL (ref 3.5–5.2)
Alkaline Phosphatase: 80 U/L (ref 39–117)
BILIRUBIN TOTAL: 0.4 mg/dL (ref 0.3–1.2)
BUN: 10 mg/dL (ref 6–23)
CALCIUM: 9.3 mg/dL (ref 8.4–10.5)
CHLORIDE: 105 meq/L (ref 96–112)
CO2: 25 mEq/L (ref 19–32)
Creat: 0.94 mg/dL (ref 0.50–1.35)
GLUCOSE: 103 mg/dL — AB (ref 70–99)
Potassium: 4.2 mEq/L (ref 3.5–5.3)
Sodium: 140 mEq/L (ref 135–145)
Total Protein: 6.7 g/dL (ref 6.0–8.3)

## 2013-12-29 LAB — POCT GLYCOSYLATED HEMOGLOBIN (HGB A1C): Hemoglobin A1C: 6.6

## 2013-12-29 LAB — LIPID PANEL
CHOL/HDL RATIO: 6.7 ratio
CHOLESTEROL: 233 mg/dL — AB (ref 0–200)
HDL: 35 mg/dL — ABNORMAL LOW (ref >39)
LDL Cholesterol: 160 mg/dL — ABNORMAL HIGH (ref 0–99)
TRIGLYCERIDES: 191 mg/dL — AB (ref <150)
VLDL: 38 mg/dL (ref 0–40)

## 2013-12-29 MED ORDER — GABAPENTIN 300 MG PO CAPS: 600.0000 mg | ORAL_CAPSULE | Freq: Three times a day (TID) | ORAL | Status: DC

## 2013-12-29 MED ORDER — TRAMADOL HCL 50 MG PO TABS: 100.0000 mg | ORAL_TABLET | Freq: Four times a day (QID) | ORAL | Status: DC | PRN

## 2013-12-29 MED ORDER — GLIMEPIRIDE 2 MG PO TABS: 2.0000 mg | ORAL_TABLET | Freq: Every day | ORAL | Status: DC

## 2013-12-29 MED ORDER — GABAPENTIN 400 MG PO CAPS: 400.0000 mg | ORAL_CAPSULE | Freq: Three times a day (TID) | ORAL | Status: DC

## 2013-12-29 NOTE — Progress Notes (Signed)
Pt is here for a f/u. Complains of lower and middle back pains for years. Recently started to hurt again. Tramadol does not work. Oxygen level fluctuants. Also complains of problems with diabetes. Checked glucose this morning with a reading of 157.

## 2013-12-29 NOTE — Telephone Encounter (Signed)
Contacted pt to give him his of his MRI schedule. 01/04/14 at [place].

## 2013-12-29 NOTE — Addendum Note (Signed)
Addended by: Allyson Sabal MD, Ascencion Dike on: 12/29/2013 01:02 PM   Modules accepted: Orders

## 2013-12-29 NOTE — Progress Notes (Signed)
Patient ID: Rodney Powers, male   DOB: January 18, 1956, 58 y.o.   MRN: [id number]   CC:  HPI: 58 year old male with a history of lung cancer status post chemoradiation, diabetes, hypertension, chronic back pain who presents for evaluation of his diabetes and back pain. The patient occasionally has numbness and tingling in his feet and is requesting an increase in his gabapentin. He CBGs were controlled between 150-200 home. His last A1c in 2014 was 6.3. CBC today is 157   No Known Allergies Past Medical History  Diagnosis Date  . Back pain, chronic   . Mediastinal mass   . Urinary retention   . Lung cancer 04/07/2013  . Diabetic neuropathy   . Diabetes mellitus, type II     takes Gimepimide and Metformin daily  . Enlarged prostate     takes Flomax every evening  . Hypertension     slightly elevated recently;no meds;just watching for now  . COPD (chronic obstructive pulmonary disease)     emphysema  . Shortness of breath     with exertion  . Pneumonia     hx of;last time about 56yrs ago  . History of bronchitis     last time 39yrs ago  . Joint pain     stiffness and cramping in both hands  . Urinary frequency   . Nocturia   . Anemia     no meds and hasn't had a transfusion ever  . Anxiety   . Depression     no meds required;hospitatlized 80yrs ago for this  . Hx of radiation therapy 04/19/13- 06/07/13    left upper central chest, 63 gray 35 fx  . Heart murmur     as a baby  . Diarrhea   . Neuropathy   . Back pain 11/14/2013   Current Outpatient Prescriptions on File Prior to Visit  Medication Sig Dispense Refill  . Artificial Saliva (BIOTENE ORALBALANCE DRY MOUTH) LIQD Use as directed 5 mLs in the mouth or throat 2 (two) times daily.  1 Bottle  3  . Cholecalciferol (VITAMIN D) 2000 UNITS tablet Take 1 tablet (2,000 Units total) by mouth daily.  30 tablet  5  . Lancets (ACCU-CHEK MULTICLIX) lancets Use as instructed  100 each  0  . Multiple Vitamin (MULTIVITAMIN WITH  MINERALS) TABS Take 1 tablet by mouth daily.      . nicotine (NICODERM CQ - DOSED IN MG/24 HR) 7 mg/24hr patch Place 1 patch onto the skin daily.       No current facility-administered medications on file prior to visit.   Family History  Problem Relation Age of Onset  . Aneurysm Mother   . Heart attack Father   . Aneurysm Sister   . Cancer Brother    History   Social History  . Marital Status: Legally Separated    Spouse Name: N/A    Number of Children: 3  . Years of Education: N/A   Occupational History  .      [employer] man   Social History Main Topics  . Smoking status: Current Every Day Smoker -- 0.50 packs/day for 41 years    Types: Cigarettes    Start date: 07/18/1971  . Smokeless tobacco: Never Used     Comment: nicotene patch  . Alcohol Use: 8.4 oz/week    14 Cans of beer per week     Comment: daily beer 2  . Drug Use: No     Comment: History of Marijuana use 30  years ago  . Sexual Activity: No   Other Topics Concern  . Not on file   Social History Narrative  . No narrative on file    Review of Systems  Constitutional: Negative for fever, chills, diaphoresis, activity change, appetite change and fatigue.  HENT: Negative for ear pain, nosebleeds, congestion, facial swelling, rhinorrhea, neck pain, neck stiffness and ear discharge.   Eyes: Negative for pain, discharge, redness, itching and visual disturbance.  Respiratory: Negative for cough, choking, chest tightness, shortness of breath, wheezing and stridor.   Cardiovascular: Negative for chest pain, palpitations and leg swelling.  Gastrointestinal: Negative for abdominal distention.  Genitourinary: Negative for dysuria, urgency, frequency, hematuria, flank pain, decreased urine volume, difficulty urinating and dyspareunia.  Musculoskeletal: Negative for back pain, joint swelling, arthralgias and gait problem.  Neurological: Negative for dizziness, tremors, seizures, syncope, facial asymmetry, speech  difficulty, weakness, light-headedness, numbness and headaches.  Hematological: Negative for adenopathy. Does not bruise/bleed easily.  Psychiatric/Behavioral: Negative for hallucinations, behavioral problems, confusion, dysphoric mood, decreased concentration and agitation.    Objective:   Filed Vitals:   12/29/13 0910  BP: 152/79  Pulse: 79  Temp: 98.7 F (37.1 C)  Resp: 14    Physical Exam  Constitutional: Appears well-developed and well-nourished. No distress.  HENT: Normocephalic. External right and left ear normal. Oropharynx is clear and moist.  Eyes: Conjunctivae and EOM are normal. PERRLA, no scleral icterus.  Neck: Normal ROM. Neck supple. No JVD. No tracheal deviation. No thyromegaly.  CVS: RRR, S1/S2 +, no murmurs, no gallops, no carotid bruit.  Pulmonary: Effort and breath sounds normal, no stridor, rhonchi, wheezes, rales.  Abdominal: Soft. BS +,  no distension, tenderness, rebound or guarding.  Musculoskeletal: Normal range of motion. No edema and no tenderness.  Lymphadenopathy: No lymphadenopathy noted, cervical, inguinal. Neuro: Alert. Normal reflexes, muscle tone coordination. No cranial nerve deficit. Skin: Skin is warm and dry. No rash noted. Not diaphoretic. No erythema. No pallor.  Psychiatric: Normal mood and affect. Behavior, judgment, thought content normal.   Lab Results  Component Value Date   WBC 4.7 11/09/2013   HGB 14.9 11/09/2013   HCT 46.2 11/09/2013   MCV 80.5 11/09/2013   PLT 221 11/09/2013   Lab Results  Component Value Date   CREATININE 1.1 11/09/2013   BUN 8.1 11/09/2013   NA 142 11/09/2013   K 4.0 11/09/2013   CL 107 08/01/2013   CO2 21* 11/09/2013    Lab Results  Component Value Date   HGBA1C 7.9* 05/17/2013   Lipid Panel  No results found for this basename: chol, trig, hdl, cholhdl, vldl, ldlcalc       Assessment and plan:   Patient Active Problem List   Diagnosis Date Noted  . Back pain 11/14/2013  . Hx of radiation  therapy   . Diabetic neuropathy, painful 06/24/2013  . Allodynia 06/24/2013  . Type II or unspecified type diabetes mellitus without mention of complication, not stated as uncontrolled 06/24/2013  . Mass of lung 06/06/2013  . COPD (chronic obstructive pulmonary disease) 05/02/2013  . Lung cancer 04/07/2013  . PNA (pneumonia) 03/06/2013  . Hyponatremia 03/06/2013  . AKI (acute kidney injury) 03/06/2013  . Protein-calorie malnutrition, moderate 03/06/2013  . Hyperglycemia 03/06/2013  . Leukocytosis 03/06/2013  . ETOH abuse 03/06/2013       Diabetes Increase Amaryl to 2 mg   Lung cancer followed by oncology PET scan in March  Chronic back pain  Schedule MRI of the lumbar spine Patient had  a bone scan that was negative for bone metastasis Refill tramadol  The patient was given clear instructions to go to ER or return to medical center if symptoms don't improve, worsen or new problems develop. The patient verbalized understanding. The patient was told to call to get any lab results if not heard anything in the next week.

## 2013-12-30 ENCOUNTER — Telehealth: Payer: Self-pay | Admitting: Internal Medicine

## 2013-12-30 LAB — TSH: TSH: 1.067 u[IU]/mL (ref 0.350–4.500)

## 2013-12-30 MED ORDER — SIMVASTATIN 20 MG PO TABS: 20.0000 mg | ORAL_TABLET | Freq: Every day | ORAL | Status: DC

## 2013-12-30 NOTE — Telephone Encounter (Signed)
Pt returning call for [place].  Please f/u with pt.

## 2014-01-02 ENCOUNTER — Telehealth: Payer: Self-pay | Admitting: *Deleted

## 2014-01-02 NOTE — Telephone Encounter (Signed)
Message copied by Torien Ramroop, [place] R on Mon Jan 02, 2014  3:35 PM ------      Message from: Allyson Sabal MD, [facility]      Created: Fri Dec 30, 2013 11:37 AM       Notify patient of the labs are normal, except cholesterol panel which is mildly elevated, called  in a prescription for simvastatin at [place] ------

## 2014-01-02 NOTE — Telephone Encounter (Signed)
Pt contacted [place] and was given his schedule for his MRI imaging.

## 2014-01-10 ENCOUNTER — Ambulatory Visit (HOSPITAL_COMMUNITY)
Admission: RE | Admit: 2014-01-10 | Discharge: 2014-01-10 | Disposition: A | Discharge status: Home / Self Care | Payer: Medicaid Other | Source: Ambulatory Visit | Attending: Internal Medicine | Admitting: Internal Medicine

## 2014-01-10 DIAGNOSIS — E119 Type 2 diabetes mellitus without complications: Secondary | ICD-10-CM

## 2014-01-10 DIAGNOSIS — M47817 Spondylosis without myelopathy or radiculopathy, lumbosacral region: Secondary | ICD-10-CM | POA: Insufficient documentation

## 2014-01-10 DIAGNOSIS — M549 Dorsalgia, unspecified: Secondary | ICD-10-CM

## 2014-01-23 ENCOUNTER — Telehealth: Payer: Self-pay

## 2014-01-23 ENCOUNTER — Telehealth: Payer: Self-pay | Admitting: Internal Medicine

## 2014-01-23 NOTE — Telephone Encounter (Signed)
Patient called to clarify his dose of gabapentin He was correctly prescribed 300mg  2 tabs po TID Was escribed to [place] out patient pharmacy

## 2014-01-23 NOTE — Telephone Encounter (Signed)
Pt says that even though script for gabapentin (NEURONTIN) 300 MG capsule was changed from 400 MG due to the change in directions/dosage it is less medication than he was taking before but he feels like it is not as effective.  He would like higher dosage.  Please f/u with pt.

## 2014-02-15 ENCOUNTER — Other Ambulatory Visit: Payer: Self-pay | Admitting: Hematology and Oncology

## 2014-02-15 DIAGNOSIS — C3490 Malignant neoplasm of unspecified part of unspecified bronchus or lung: Secondary | ICD-10-CM

## 2014-02-21 ENCOUNTER — Encounter (HOSPITAL_COMMUNITY): Payer: Medicaid Other

## 2014-02-22 ENCOUNTER — Encounter (HOSPITAL_COMMUNITY): Payer: Self-pay

## 2014-02-22 ENCOUNTER — Ambulatory Visit (HOSPITAL_COMMUNITY)
Admission: RE | Admit: 2014-02-22 | Discharge: 2014-02-22 | Disposition: A | Discharge status: Home / Self Care | Payer: Medicaid Other | Source: Ambulatory Visit | Attending: Hematology and Oncology | Admitting: Hematology and Oncology

## 2014-02-22 DIAGNOSIS — J90 Pleural effusion, not elsewhere classified: Secondary | ICD-10-CM | POA: Insufficient documentation

## 2014-02-22 DIAGNOSIS — Z923 Personal history of irradiation: Secondary | ICD-10-CM | POA: Insufficient documentation

## 2014-02-22 DIAGNOSIS — J438 Other emphysema: Secondary | ICD-10-CM | POA: Insufficient documentation

## 2014-02-22 DIAGNOSIS — R0602 Shortness of breath: Secondary | ICD-10-CM | POA: Insufficient documentation

## 2014-02-22 DIAGNOSIS — Z9221 Personal history of antineoplastic chemotherapy: Secondary | ICD-10-CM | POA: Insufficient documentation

## 2014-02-22 DIAGNOSIS — C3490 Malignant neoplasm of unspecified part of unspecified bronchus or lung: Secondary | ICD-10-CM | POA: Insufficient documentation

## 2014-02-22 DIAGNOSIS — I2510 Atherosclerotic heart disease of native coronary artery without angina pectoris: Secondary | ICD-10-CM | POA: Insufficient documentation

## 2014-02-22 MED ORDER — IOHEXOL 300 MG/ML  SOLN
80.0000 mL | Freq: Once | INTRAMUSCULAR | Status: AC | PRN
  Administered 2014-02-22: 80 mL via INTRAVENOUS

## 2014-02-27 ENCOUNTER — Telehealth: Payer: Self-pay | Admitting: Internal Medicine

## 2014-02-27 ENCOUNTER — Ambulatory Visit: Payer: Medicaid Other | Admitting: Internal Medicine

## 2014-02-27 NOTE — Telephone Encounter (Signed)
Pt called regarding a medication, pt states that he would like for the doctor to prescribe him a diabetic cream for his rash. Please contact pt

## 2014-03-01 NOTE — Telephone Encounter (Signed)
Left message on voicemail.

## 2014-03-03 ENCOUNTER — Telehealth: Payer: Self-pay | Admitting: Internal Medicine

## 2014-03-03 NOTE — Telephone Encounter (Signed)
Pt called returning a call back from [place] thinks it is for his results. Please give pt a call back

## 2014-03-07 ENCOUNTER — Other Ambulatory Visit (HOSPITAL_BASED_OUTPATIENT_CLINIC_OR_DEPARTMENT_OTHER): Payer: Medicaid Other

## 2014-03-07 ENCOUNTER — Ambulatory Visit (HOSPITAL_BASED_OUTPATIENT_CLINIC_OR_DEPARTMENT_OTHER): Payer: Medicaid Other | Admitting: Hematology and Oncology

## 2014-03-07 ENCOUNTER — Encounter: Payer: Self-pay | Admitting: Hematology and Oncology

## 2014-03-07 VITALS — BP 148/70 | HR 72 | Temp 98.0°F | Resp 18 | Ht 75.0 in | Wt 241.1 lb

## 2014-03-07 DIAGNOSIS — C3490 Malignant neoplasm of unspecified part of unspecified bronchus or lung: Secondary | ICD-10-CM

## 2014-03-07 DIAGNOSIS — C3410 Malignant neoplasm of upper lobe, unspecified bronchus or lung: Secondary | ICD-10-CM

## 2014-03-07 DIAGNOSIS — F17200 Nicotine dependence, unspecified, uncomplicated: Secondary | ICD-10-CM

## 2014-03-07 DIAGNOSIS — M549 Dorsalgia, unspecified: Secondary | ICD-10-CM

## 2014-03-07 LAB — CBC WITH DIFFERENTIAL/PLATELET
BASO%: 0.9 % (ref 0.0–2.0)
Basophils Absolute: 0 10*3/uL (ref 0.0–0.1)
EOS%: 2.8 % (ref 0.0–7.0)
Eosinophils Absolute: 0.1 10*3/uL (ref 0.0–0.5)
HEMATOCRIT: 44.7 % (ref 38.4–49.9)
HGB: 14.4 g/dL (ref 13.0–17.1)
LYMPH%: 24.2 % (ref 14.0–49.0)
MCH: 25.8 pg — AB (ref 27.2–33.4)
MCHC: 32.1 g/dL (ref 32.0–36.0)
MCV: 80.3 fL (ref 79.3–98.0)
MONO#: 0.5 10*3/uL (ref 0.1–0.9)
MONO%: 10.5 % (ref 0.0–14.0)
NEUT#: 2.6 10*3/uL (ref 1.5–6.5)
NEUT%: 61.6 % (ref 39.0–75.0)
PLATELETS: 197 10*3/uL (ref 140–400)
RBC: 5.57 10*6/uL (ref 4.20–5.82)
RDW: 16.3 % — ABNORMAL HIGH (ref 11.0–14.6)
WBC: 4.3 10*3/uL (ref 4.0–10.3)
lymph#: 1 10*3/uL (ref 0.9–3.3)

## 2014-03-07 LAB — COMPREHENSIVE METABOLIC PANEL (CC13)
ALK PHOS: 91 U/L (ref 40–150)
ALT: 13 U/L (ref 0–55)
ANION GAP: 11 meq/L (ref 3–11)
AST: 16 U/L (ref 5–34)
Albumin: 3.7 g/dL (ref 3.5–5.0)
BILIRUBIN TOTAL: 0.28 mg/dL (ref 0.20–1.20)
BUN: 11.6 mg/dL (ref 7.0–26.0)
CO2: 24 mEq/L (ref 22–29)
Calcium: 9.8 mg/dL (ref 8.4–10.4)
Chloride: 108 mEq/L (ref 98–109)
Creatinine: 1.3 mg/dL (ref 0.7–1.3)
Glucose: 164 mg/dl — ABNORMAL HIGH (ref 70–140)
Potassium: 4.2 mEq/L (ref 3.5–5.1)
SODIUM: 143 meq/L (ref 136–145)
TOTAL PROTEIN: 7.4 g/dL (ref 6.4–8.3)

## 2014-03-07 NOTE — Progress Notes (Signed)
[name] OFFICE PROGRESS NOTE  Patient Care Team: Lorayne Marek, MD as PCP - General (Internal Medicine) Rigoberto Noel, MD as Attending Physician (Pulmonary Disease) Louellen Molder, MD as Attending Physician (Internal Medicine) Heath Lark, MD as Consulting Physician (Hematology and Oncology)  DIAGNOSIS: Stage III lung cancer, no evidence of recurrence  SUMMARY OF ONCOLOGIC HISTORY: This is a patient who was discovered to have lung cancer after presentation with dysphagia. He completed Chemoradiation with weekly chemo Carboplatin/Taxol and daily XRT  (04/18/2013-05/27/2013). In July of 2014, he had repeat laryngoscope be EGD and biopsy and tonsillectomy which showed no evidence of cancer.  INTERVAL HISTORY: Rodney Powers 58 y.o. male returns for further followup. He denies any recent cough or hemoptysis. The patient continued to smoke but is attempting to quit smoking. His main complaint is an ulceration on bilateral anterior shin area which is slow to heal. The patient continued to have chronic and persistent back pain. I have reviewed the past medical history, past surgical history, social history and family history with the patient and they are unchanged from previous note.  ALLERGIES:  has No Known Allergies.  MEDICATIONS:  Current Outpatient Prescriptions  Medication Sig Dispense Refill  . Artificial Saliva (BIOTENE ORALBALANCE DRY MOUTH) LIQD Use as directed 5 mLs in the mouth or throat 2 (two) times daily.  1 Bottle  3  . Cholecalciferol (VITAMIN D) 2000 UNITS tablet Take 1 tablet (2,000 Units total) by mouth daily.  30 tablet  5  . gabapentin (NEURONTIN) 300 MG capsule Take 2 capsules (600 mg total) by mouth 3 (three) times daily.  180 capsule  2  . glimepiride (AMARYL) 2 MG tablet Take 1 tablet (2 mg total) by mouth daily before breakfast.  30 tablet  3  . Lancets (ACCU-CHEK MULTICLIX) lancets Use as instructed  100 each  0  . Multiple Vitamin  (MULTIVITAMIN WITH MINERALS) TABS Take 1 tablet by mouth daily.      . nicotine (NICODERM CQ - DOSED IN MG/24 HR) 7 mg/24hr patch Place 1 patch onto the skin daily.      . simvastatin (ZOCOR) 20 MG tablet Take 1 tablet (20 mg total) by mouth at bedtime.  90 tablet  3  . traMADol (ULTRAM) 50 MG tablet Take 2 tablets (100 mg total) by mouth every 6 (six) hours as needed.  120 tablet  2   No current facility-administered medications for this visit.    REVIEW OF SYSTEMS:   Constitutional: Denies fevers, chills or abnormal weight loss Eyes: Denies blurriness of vision Ears, nose, mouth, throat, and face: Denies mucositis or sore throat Cardiovascular: Denies palpitation, chest discomfort or lower extremity swelling Gastrointestinal:  Denies nausea, heartburn or change in bowel habits Lymphatics: Denies new lymphadenopathy or easy bruising Neurological:Denies numbness, tingling or new weaknesses Behavioral/Psych: Mood is stable, no new changes  All other systems were reviewed with the patient and are negative.  PHYSICAL EXAMINATION: ECOG PERFORMANCE STATUS: 1 - Symptomatic but completely ambulatory  Filed Vitals:   03/07/14 1212  BP: 148/70  Pulse: 72  Temp: 98 F (36.7 C)  Resp: 18   Filed Weights   03/07/14 1212  Weight: 241 lb 1.6 oz (109.362 kg)    GENERAL:alert, no distress and comfortable. He is morbidly obese  SKIN:  noted ulceration in both shin area, consistent with poor circulation from peripheral vascular disease and diabetes  EYES: normal, Conjunctiva are pink and non-injected, sclera clear OROPHARYNX:no exudate, no erythema and lips, buccal  mucosa, and tongue normal  NECK: supple, thyroid normal size, non-tender, without nodularity LYMPH:  no palpable lymphadenopathy in the cervical, axillary or inguinal LUNGS: clear to auscultation and percussion with normal breathing effort HEART: regular rate & rhythm and no murmurs and no lower extremity edema ABDOMEN:abdomen  soft, non-tender and normal bowel sounds Musculoskeletal:no cyanosis of digits and no clubbing  NEURO: alert & oriented x 3 with fluent speech, no focal motor/sensory deficits  LABORATORY DATA:  I have reviewed the data as listed    Component Value Date/Time   NA 143 03/07/2014 1206   NA 140 12/29/2013 0932   K 4.2 03/07/2014 1206   K 4.2 12/29/2013 0932   CL 105 12/29/2013 0932   CL 104 06/06/2013 0841   CO2 24 03/07/2014 1206   CO2 25 12/29/2013 0932   GLUCOSE 164* 03/07/2014 1206   GLUCOSE 103* 12/29/2013 0932   GLUCOSE 258* 06/06/2013 0841   BUN 11.6 03/07/2014 1206   BUN 10 12/29/2013 0932   CREATININE 1.3 03/07/2014 1206   CREATININE 0.94 12/29/2013 0932   CREATININE 1.01 08/01/2013 0640   CALCIUM 9.8 03/07/2014 1206   CALCIUM 9.3 12/29/2013 0932   PROT 7.4 03/07/2014 1206   PROT 6.7 12/29/2013 0932   ALBUMIN 3.7 03/07/2014 1206   ALBUMIN 4.0 12/29/2013 0932   AST 16 03/07/2014 1206   AST 26 12/29/2013 0932   ALT 13 03/07/2014 1206   ALT 23 12/29/2013 0932   ALKPHOS 91 03/07/2014 1206   ALKPHOS 80 12/29/2013 0932   BILITOT 0.28 03/07/2014 1206   BILITOT 0.4 12/29/2013 0932   GFRNONAA 81* 08/01/2013 0640   GFRAA >90 08/01/2013 0640    No results found for this basename: SPEP,  UPEP,   kappa and lambda light chains    Lab Results  Component Value Date   WBC 4.3 03/07/2014   NEUTROABS 2.6 03/07/2014   HGB 14.4 03/07/2014   HCT 44.7 03/07/2014   MCV 80.3 03/07/2014   PLT 197 03/07/2014      Chemistry      Component Value Date/Time   NA 143 03/07/2014 1206   NA 140 12/29/2013 0932   K 4.2 03/07/2014 1206   K 4.2 12/29/2013 0932   CL 105 12/29/2013 0932   CL 104 06/06/2013 0841   CO2 24 03/07/2014 1206   CO2 25 12/29/2013 0932   BUN 11.6 03/07/2014 1206   BUN 10 12/29/2013 0932   CREATININE 1.3 03/07/2014 1206   CREATININE 0.94 12/29/2013 0932   CREATININE 1.01 08/01/2013 0640      Component Value Date/Time   CALCIUM 9.8 03/07/2014 1206   CALCIUM 9.3 12/29/2013 0932   ALKPHOS 91 03/07/2014 1206    ALKPHOS 80 12/29/2013 0932   AST 16 03/07/2014 1206   AST 26 12/29/2013 0932   ALT 13 03/07/2014 1206   ALT 23 12/29/2013 0932   BILITOT 0.28 03/07/2014 1206   BILITOT 0.4 12/29/2013 0932       RADIOGRAPHIC STUDIES: CT scan show no evidence of recurrence of cancer  I have personally reviewed the radiological images as listed and agreed with the findings in the report.  ASSESSMENT & PLAN:  #1 lung cancer, no evidence of recurrence I recommend repeat CT scan in 6 months. Continue supportive care #2 lung nodules As mentioned above we will monitored carefully. #3 significant back pain I recommend vitamin D supplements. Bone scan ruled out bone metastasis. I commend he contact his primary care provider for pain medication #4 smoking  I discussed with him the importance of nicotine cessation. The patient wants to attempt to quit by himself. #5 poor circulation with skin ulcer I recommend the patient to quit smoking and followup with his primary care provider for further management.  Orders Placed This Encounter  Procedures  . CT Chest W Contrast    Standing Status: Future     Number of Occurrences:      Standing Expiration Date: 05/07/2015    Order Specific Question:  Reason for Exam (SYMPTOM  OR DIAGNOSIS REQUIRED)    Answer:  lung ca, r.o recurrence    Order Specific Question:  Preferred imaging location?    Answer:  [facility]  . CBC with Differential    Standing Status: Future     Number of Occurrences:      Standing Expiration Date: 03/07/2015  . Comprehensive metabolic panel    Standing Status: Future     Number of Occurrences:      Standing Expiration Date: 03/07/2015   All questions were answered. The patient knows to call the clinic with any problems, questions or concerns. No barriers to learning was detected. I spent 25 minutes counseling the patient face to face. The total time spent in the appointment was 40 minutes and more than 50% was on counseling and review  of test results     [facility], [place], MD 03/07/2014 4:49 PM

## 2014-03-08 ENCOUNTER — Telehealth: Payer: Self-pay | Admitting: Hematology and Oncology

## 2014-03-08 NOTE — Telephone Encounter (Signed)
lvm for pt regarding to Sept appt....mailed pt appt sched/avs and letter °

## 2014-03-14 ENCOUNTER — Ambulatory Visit: Payer: Medicaid Other | Attending: Internal Medicine | Admitting: Internal Medicine

## 2014-03-14 ENCOUNTER — Encounter: Payer: Self-pay | Admitting: Internal Medicine

## 2014-03-14 VITALS — BP 150/80 | HR 88 | Temp 98.0°F | Resp 16 | Wt 239.0 lb

## 2014-03-14 DIAGNOSIS — R21 Rash and other nonspecific skin eruption: Secondary | ICD-10-CM

## 2014-03-14 DIAGNOSIS — E1142 Type 2 diabetes mellitus with diabetic polyneuropathy: Secondary | ICD-10-CM

## 2014-03-14 DIAGNOSIS — F17200 Nicotine dependence, unspecified, uncomplicated: Secondary | ICD-10-CM

## 2014-03-14 DIAGNOSIS — F329 Major depressive disorder, single episode, unspecified: Secondary | ICD-10-CM | POA: Insufficient documentation

## 2014-03-14 DIAGNOSIS — F411 Generalized anxiety disorder: Secondary | ICD-10-CM | POA: Insufficient documentation

## 2014-03-14 DIAGNOSIS — E119 Type 2 diabetes mellitus without complications: Secondary | ICD-10-CM

## 2014-03-14 DIAGNOSIS — E1149 Type 2 diabetes mellitus with other diabetic neurological complication: Secondary | ICD-10-CM

## 2014-03-14 DIAGNOSIS — J449 Chronic obstructive pulmonary disease, unspecified: Secondary | ICD-10-CM | POA: Insufficient documentation

## 2014-03-14 DIAGNOSIS — F3289 Other specified depressive episodes: Secondary | ICD-10-CM | POA: Insufficient documentation

## 2014-03-14 DIAGNOSIS — I10 Essential (primary) hypertension: Secondary | ICD-10-CM

## 2014-03-14 DIAGNOSIS — E1140 Type 2 diabetes mellitus with diabetic neuropathy, unspecified: Secondary | ICD-10-CM

## 2014-03-14 DIAGNOSIS — J4489 Other specified chronic obstructive pulmonary disease: Secondary | ICD-10-CM | POA: Insufficient documentation

## 2014-03-14 LAB — GLUCOSE, POCT (MANUAL RESULT ENTRY): POC Glucose: 173 mg/dl — AB (ref 70–99)

## 2014-03-14 LAB — POCT GLYCOSYLATED HEMOGLOBIN (HGB A1C): Hemoglobin A1C: 7.3

## 2014-03-14 MED ORDER — GABAPENTIN 400 MG PO CAPS: 400.0000 mg | ORAL_CAPSULE | Freq: Three times a day (TID) | ORAL | Status: DC

## 2014-03-14 MED ORDER — HYDROCORTISONE 2.5 % EX CREA: TOPICAL_CREAM | Freq: Two times a day (BID) | CUTANEOUS | Status: DC

## 2014-03-14 MED ORDER — GLIMEPIRIDE 4 MG PO TABS: 4.0000 mg | ORAL_TABLET | Freq: Every day | ORAL | Status: DC

## 2014-03-14 MED ORDER — RAMIPRIL 2.5 MG PO CAPS: 2.5000 mg | ORAL_CAPSULE | Freq: Every day | ORAL | Status: DC

## 2014-03-14 NOTE — Progress Notes (Signed)
MRN: [id number] Name: Rodney Powers  Sex: male Age: 58 y.o. DOB: 02/08/1956  Allergies: Review of patient's allergies indicates no known allergies.  Chief Complaint  Patient presents with  . Follow-up    DM    HPI: Patient is 58 y.o. male who has to of diabetes with neuropathy, hyperlipidemia, history of lung cancer following up with oncologist, comes today for followup, as per patient he has been taking Amaryl 2 mg daily, today his A1c has trended up compared to last visit, as per patient his fasting sugar is usually between 120 to 1:30 milligrams a deciliter, denies any hypoglycemic symptoms, patient is requesting to increase the dose of Neurontin since this 2 he has numbness tingling in his legs, patient also reported noticed some rash on the legs applied over the counter medication without much improvement, patient is to smoke cigarettes advised to quit smoking. Patient's blood pressure has been trending up has not been on any medication denies any headache dizziness chest and shortness of breath.   Past Medical History  Diagnosis Date  . Back pain, chronic   . Mediastinal mass   . Urinary retention   . Lung cancer 04/07/2013  . Diabetic neuropathy   . Diabetes mellitus, type II     takes Gimepimide and Metformin daily  . Enlarged prostate     takes Flomax every evening  . Hypertension     slightly elevated recently;no meds;just watching for now  . COPD (chronic obstructive pulmonary disease)     emphysema  . Shortness of breath     with exertion  . Pneumonia     hx of;last time about 27yrs ago  . History of bronchitis     last time 92yrs ago  . Joint pain     stiffness and cramping in both hands  . Urinary frequency   . Nocturia   . Anemia     no meds and hasn't had a transfusion ever  . Anxiety   . Depression     no meds required;hospitatlized 22yrs ago for this  . Hx of radiation therapy 04/19/13- 06/07/13    left upper central chest, 63 gray 35 fx  . Heart  murmur     as a baby  . Diarrhea   . Neuropathy   . Back pain 11/14/2013    Past Surgical History  Procedure Laterality Date  . Video bronchoscopy with endobronchial ultrasound Bilateral 03/10/2013    Procedure: VIDEO BRONCHOSCOPY WITH ENDOBRONCHIAL ULTRASOUND;  Surgeon: Rigoberto Noel, MD;  Location: [facility];  Service: Pulmonary;  Laterality: Bilateral;  . Mediastinotomy chamberlain [email or web address] Left 03/24/2013    Procedure: MEDIASTINOTOMY CHAMBERLAIN MCNEIL;  Surgeon: Melrose Nakayama, MD;  Location: [facility];  Service: Thoracic;  Laterality: Left;  . Excisional hemorrhoidectomy      > 30 yrs ago  . Colonoscopy    . Direct laryngoscopy N/A 07/01/2013    Procedure: DIRECT LARYNGOSCOPY;  Surgeon: Izora Gala, MD;  Location: [facility];  Service: ENT;  Laterality: N/A;  . Esophagoscopy N/A 07/01/2013    Procedure: ESOPHAGOSCOPY WITH BIOPSY;  Surgeon: Izora Gala, MD;  Location: [facility] OR;  Service: ENT;  Laterality: N/A;  . Direct laryngoscopy N/A 08/01/2013    Procedure: DIRECT LARYNGOSCOPY WITH EXCISION OF PHARYNGEAL MASS;  Surgeon: Izora Gala, MD;  Location: [facility];  Service: ENT;  Laterality: N/A;  . Tonsillectomy Right 08/01/2013    Procedure: TONSILLECTOMY;  Surgeon: Izora Gala, MD;  Location: [facility];  Service: ENT;  Laterality: Right;      Medication List       This list is accurate as of: 03/14/14  3:22 PM.  Always use your most recent med list.               accu-chek multiclix lancets  Use as instructed     BIOTENE ORALBALANCE DRY MOUTH Liqd  Use as directed 5 mLs in the mouth or throat 2 (two) times daily.     gabapentin 400 MG capsule  Commonly known as:  NEURONTIN  Take 1 capsule (400 mg total) by mouth 3 (three) times daily.     glimepiride 4 MG tablet  Commonly known as:  AMARYL  Take 1 tablet (4 mg total) by mouth daily before breakfast.     hydrocortisone 2.5 % cream  Apply topically 2 (two) times daily.     multivitamin with minerals Tabs tablet  Take 1 tablet by  mouth daily.     nicotine 7 mg/24hr patch  Commonly known as:  NICODERM CQ - dosed in mg/24 hr  Place 1 patch onto the skin daily.     ramipril 2.5 MG capsule  Commonly known as:  ALTACE  Take 1 capsule (2.5 mg total) by mouth daily.     simvastatin 20 MG tablet  Commonly known as:  ZOCOR  Take 1 tablet (20 mg total) by mouth at bedtime.     traMADol 50 MG tablet  Commonly known as:  ULTRAM  Take 2 tablets (100 mg total) by mouth every 6 (six) hours as needed.     Vitamin D 2000 UNITS tablet  Take 1 tablet (2,000 Units total) by mouth daily.        Meds ordered this encounter  Medications  . gabapentin (NEURONTIN) 400 MG capsule    Sig: Take 1 capsule (400 mg total) by mouth 3 (three) times daily.    Dispense:  180 capsule    Refill:  2  . glimepiride (AMARYL) 4 MG tablet    Sig: Take 1 tablet (4 mg total) by mouth daily before breakfast.    Dispense:  30 tablet    Refill:  3  . hydrocortisone 2.5 % cream    Sig: Apply topically 2 (two) times daily.    Dispense:  30 g    Refill:  0  . ramipril (ALTACE) 2.5 MG capsule    Sig: Take 1 capsule (2.5 mg total) by mouth daily.    Dispense:  90 capsule    Refill:  3     There is no immunization history on file for this patient.  Family History  Problem Relation Age of Onset  . Aneurysm Mother   . Heart attack Father   . Aneurysm Sister   . Cancer Brother     History  Substance Use Topics  . Smoking status: Current Every Day Smoker -- 0.50 packs/day for 41 years    Types: Cigarettes    Start date: 07/18/1971  . Smokeless tobacco: Never Used     Comment: nicotene patch  . Alcohol Use: 8.4 oz/week    14 Cans of beer per week     Comment: daily beer 2    Review of Systems   As noted in HPI  Filed Vitals:   03/14/14 1515  BP: 150/80  Pulse:   Temp:   Resp:     Physical Exam  Physical Exam  Constitutional: No distress.  Eyes: EOM are normal. Pupils are equal, round, and  reactive to light.    Cardiovascular: Normal rate and regular rhythm.   Pulmonary/Chest: Breath sounds normal. No respiratory distress. He has no wheezes. He has no rales.  Skin:  Rash on both legs    CBC    Component Value Date/Time   WBC 4.3 03/07/2014 1205   WBC 3.4* 12/29/2013 0932   RBC 5.57 03/07/2014 1205   RBC 5.55 12/29/2013 0932   HGB 14.4 03/07/2014 1205   HGB 14.6 12/29/2013 0932   HCT 44.7 03/07/2014 1205   HCT 44.7 12/29/2013 0932   PLT 197 03/07/2014 1205   PLT 219 12/29/2013 0932   MCV 80.3 03/07/2014 1205   MCV 80.5 12/29/2013 0932   LYMPHSABS 1.0 03/07/2014 1205   LYMPHSABS 0.7 12/29/2013 0932   MONOABS 0.5 03/07/2014 1205   MONOABS 0.3 12/29/2013 0932   EOSABS 0.1 03/07/2014 1205   EOSABS 0.1 12/29/2013 0932   BASOSABS 0.0 03/07/2014 1205   BASOSABS 0.0 12/29/2013 0932    CMP     Component Value Date/Time   NA 143 03/07/2014 1206   NA 140 12/29/2013 0932   K 4.2 03/07/2014 1206   K 4.2 12/29/2013 0932   CL 105 12/29/2013 0932   CL 104 06/06/2013 0841   CO2 24 03/07/2014 1206   CO2 25 12/29/2013 0932   GLUCOSE 164* 03/07/2014 1206   GLUCOSE 103* 12/29/2013 0932   GLUCOSE 258* 06/06/2013 0841   BUN 11.6 03/07/2014 1206   BUN 10 12/29/2013 0932   CREATININE 1.3 03/07/2014 1206   CREATININE 0.94 12/29/2013 0932   CREATININE 1.01 08/01/2013 0640   CALCIUM 9.8 03/07/2014 1206   CALCIUM 9.3 12/29/2013 0932   PROT 7.4 03/07/2014 1206   PROT 6.7 12/29/2013 0932   ALBUMIN 3.7 03/07/2014 1206   ALBUMIN 4.0 12/29/2013 0932   AST 16 03/07/2014 1206   AST 26 12/29/2013 0932   ALT 13 03/07/2014 1206   ALT 23 12/29/2013 0932   ALKPHOS 91 03/07/2014 1206   ALKPHOS 80 12/29/2013 0932   BILITOT 0.28 03/07/2014 1206   BILITOT 0.4 12/29/2013 0932   GFRNONAA >89 12/29/2013 0932   GFRNONAA 81* 08/01/2013 0640   GFRAA >89 12/29/2013 0932   GFRAA >90 08/01/2013 0640    Lab Results  Component Value Date/Time   CHOL 233* 12/29/2013  9:32 AM    No components found with this basename: hga1c    Lab Results  Component Value  Date/Time   AST 16 03/07/2014 12:06 PM   AST 26 12/29/2013  9:32 AM    Assessment and Plan  DM (diabetes mellitus) - Plan: Glucose (CBG), HgB A1c, glimepiride (AMARYL) 4 MG tablet Results for orders placed in visit on 03/14/14  GLUCOSE, POCT (MANUAL RESULT ENTRY)      Result Value Ref Range   POC Glucose 173 (*) 70 - 99 mg/dl  POCT GLYCOSYLATED HEMOGLOBIN (HGB A1C)      Result Value Ref Range   Hemoglobin A1C 7.3     A1c is trending up I have increased the dose of Amaryl to 4 mg daily, advised for low carbohydrate diet and keep the fingerstick log  Rash and nonspecific skin eruption - Plan: hydrocortisone 2.5 % cream  Diabetic neuropathy, painful - Plan: I have increased the dose of Neurontin to  gabapentin (NEURONTIN) 400 MG capsule, 2 capsules 3 times a day.  Smoker Advise patient to quit smoking  Essential hypertension, benign - Plan: Advised for DASH diet also started on low-dose  ramipril (ALTACE) 2.5 MG  capsule     Return in about 3 months (around 06/13/2014) for diabetes, hypertension.  Lorayne Marek, MD

## 2014-03-14 NOTE — Patient Instructions (Signed)
DASH Diet The DASH diet stands for "Dietary Approaches to Stop Hypertension." It is a healthy eating plan that has been shown to reduce high blood pressure (hypertension) in as little as 14 days, while also possibly providing other significant health benefits. These other health benefits include reducing the risk of breast cancer after menopause and reducing the risk of type 2 diabetes, heart disease, colon cancer, and stroke. Health benefits also include weight loss and slowing kidney failure in patients with chronic kidney disease.  DIET GUIDELINES  Limit salt (sodium). Your diet should contain less than 1500 mg of sodium daily.  Limit refined or processed carbohydrates. Your diet should include mostly whole grains. Desserts and added sugars should be used sparingly.  Include small amounts of heart-healthy fats. These types of fats include nuts, oils, and tub margarine. Limit saturated and trans fats. These fats have been shown to be harmful in the body. CHOOSING FOODS  The following food groups are based on a 2000 calorie diet. See your Registered Dietitian for individual calorie needs. Grains and Grain Products (6 to 8 servings daily)  Eat More Often: Whole-wheat bread, brown rice, whole-grain or wheat pasta, quinoa, popcorn without added fat or salt (air popped).  Eat Less Often: White bread, white pasta, white rice, cornbread. Vegetables (4 to 5 servings daily)  Eat More Often: Fresh, frozen, and canned vegetables. Vegetables may be raw, steamed, roasted, or grilled with a minimal amount of fat.  Eat Less Often/Avoid: Creamed or fried vegetables. Vegetables in a cheese sauce. Fruit (4 to 5 servings daily)  Eat More Often: All fresh, canned (in natural juice), or frozen fruits. Dried fruits without added sugar. One hundred percent fruit juice ( cup [237 mL] daily).  Eat Less Often: Dried fruits with added sugar. Canned fruit in light or heavy syrup. [employer], Fish, and Poultry (2  servings or less daily. One serving is 3 to 4 oz [85-114 g]).  Eat More Often: Ninety percent or leaner ground beef, tenderloin, sirloin. Round cuts of beef, chicken breast, [place] breast. All fish. Grill, bake, or broil your meat. Nothing should be fried.  Eat Less Often/Avoid: Fatty cuts of meat, [place], or chicken leg, thigh, or wing. Fried cuts of meat or fish. Dairy (2 to 3 servings)  Eat More Often: Low-fat or fat-free milk, low-fat plain or light yogurt, reduced-fat or part-skim cheese.  Eat Less Often/Avoid: Milk (whole, 2%).Whole milk yogurt. Full-fat cheeses. Nuts, Seeds, and Legumes (4 to 5 servings per week)  Eat More Often: All without added salt.  Eat Less Often/Avoid: Salted nuts and seeds, canned beans with added salt. Fats and Sweets (limited)  Eat More Often: Vegetable oils, tub margarines without trans fats, sugar-free gelatin. Mayonnaise and salad dressings.  Eat Less Often/Avoid: Coconut oils, palm oils, butter, stick margarine, cream, half and half, cookies, candy, pie. FOR MORE INFORMATION The Dash Diet Eating Plan: www.dashdiet.org Document Released: 11/20/2011 Document Revised: 02/23/2012 Document Reviewed: 11/20/2011 [facility] Patient Information 2014 [employer] [place]. Diabetes Meal Planning Guide The diabetes meal planning guide is a tool to help you plan your meals and snacks. It is important for people with diabetes to manage their blood glucose (sugar) levels. Choosing the right foods and the right amounts throughout your day will help control your blood glucose. Eating right can even help you improve your blood pressure and reach or maintain a healthy weight. CARBOHYDRATE COUNTING MADE EASY When you eat carbohydrates, they turn to sugar. This raises your blood glucose level. Counting carbohydrates  can help you control this level so you feel better. When you plan your meals by counting carbohydrates, you can have more flexibility in what you eat and balance  your medicine with your food intake. Carbohydrate counting simply means adding up the total amount of carbohydrate grams in your meals and snacks. Try to eat about the same amount at each meal. Foods with carbohydrates are listed below. Each portion below is 1 carbohydrate serving or 15 grams of carbohydrates. Ask your dietician how many grams of carbohydrates you should eat at each meal or snack. Grains and Starches  1 slice bread.   English muffin or hotdog/hamburger bun.   cup cold cereal (unsweetened).   cup cooked pasta or rice.   cup starchy vegetables (corn, potatoes, peas, beans, winter squash).  1 tortilla (6 inches).   bagel.  1 waffle or pancake (size of a CD).   cup cooked cereal.  4 to 6 small crackers. *Whole grain is recommended. Fruit  1 cup fresh unsweetened berries, melon, papaya, pineapple.  1 small fresh fruit.   banana or mango.   cup fruit juice (4 oz unsweetened).   cup canned fruit in natural juice or water.  2 tbs dried fruit.  12 to 15 grapes or cherries. Milk and Yogurt  1 cup fat-free or 1% milk.  1 cup soy milk.  6 oz light yogurt with sugar-free sweetener.  6 oz low-fat soy yogurt.  6 oz plain yogurt. Vegetables  1 cup raw or  cup cooked is counted as 0 carbohydrates or a "free" food.  If you eat 3 or more servings at 1 meal, count them as 1 carbohydrate serving. Other Carbohydrates   oz chips or pretzels.   cup ice cream or frozen yogurt.   cup sherbet or sorbet.  2 inch square cake, no frosting.  1 tbs honey, sugar, jam, jelly, or syrup.  2 small cookies.  3 squares of graham crackers.  3 cups popcorn.  6 crackers.  1 cup broth-based soup.  Count 1 cup casserole or other mixed foods as 2 carbohydrate servings.  Foods with less than 20 calories in a serving may be counted as 0 carbohydrates or a "free" food. You may want to purchase a book or computer software that lists the carbohydrate gram  counts of different foods. In addition, the nutrition facts panel on the labels of the foods you eat are a good source of this information. The label will tell you how big the serving size is and the total number of carbohydrate grams you will be eating per serving. Divide this number by 15 to obtain the number of carbohydrate servings in a portion. Remember, 1 carbohydrate serving equals 15 grams of carbohydrate. SERVING SIZES Measuring foods and serving sizes helps you make sure you are getting the right amount of food. The list below tells how big or small some common serving sizes are.  1 oz.........4 stacked dice.  3 oz........Marland KitchenDeck of cards.  1 tsp.......[place] KitchenTip of little finger.  1 tbs......Marland KitchenMarland KitchenThumb.  2 tbs.......[place] KitchenGolf ball.   cup......Marland KitchenHalf of a fist.  1 cup.......[place] KitchenA fist. SAMPLE DIABETES MEAL PLAN Below is a sample meal plan that includes foods from the grain and starches, dairy, vegetable, fruit, and meat groups. A dietician can individualize a meal plan to fit your calorie needs and tell you the number of servings needed from each food group. However, controlling the total amount of carbohydrates in your meal or snack is more important than making sure you  include all of the food groups at every meal. You may interchange carbohydrate containing foods (dairy, starches, and fruits). The meal plan below is an example of a 2000 calorie diet using carbohydrate counting. This meal plan has 17 carbohydrate servings. Breakfast  1 cup oatmeal (2 carb servings).   cup light yogurt (1 carb serving).  1 cup blueberries (1 carb serving).   cup almonds. Snack  1 large apple (2 carb servings).  1 low-fat string cheese stick. Lunch  Chicken breast salad.  1 cup spinach.   cup chopped tomatoes.  2 oz chicken breast, sliced.  2 tbs low-fat [place] dressing.  12 whole-wheat crackers (2 carb servings).  12 to 15 grapes (1 carb serving).  1 cup low-fat milk (1 carb  serving). Snack  1 cup carrots.   cup hummus (1 carb serving). Dinner  3 oz broiled salmon.  1 cup brown rice (3 carb servings). Snack  1  cups steamed broccoli (1 carb serving) drizzled with 1 tsp olive oil and lemon juice.  1 cup light pudding (2 carb servings). DIABETES MEAL PLANNING WORKSHEET Your dietician can use this worksheet to help you decide how many servings of foods and what types of foods are right for you.  BREAKFAST Food Group and Servings / Carb Servings Grain/Starches __________________________________ Dairy __________________________________________ Vegetable ______________________________________ Fruit ___________________________________________ Meat __________________________________________ Fat ____________________________________________ LUNCH Food Group and Servings / Carb Servings Grain/Starches ___________________________________ Dairy ___________________________________________ Fruit ____________________________________________ Meat ___________________________________________ Fat _____________________________________________ Rodney Powers Food Group and Servings / Carb Servings Grain/Starches ___________________________________ Dairy ___________________________________________ Fruit ____________________________________________ Meat ___________________________________________ Fat _____________________________________________ SNACKS Food Group and Servings / Carb Servings Grain/Starches ___________________________________ Dairy ___________________________________________ Vegetable _______________________________________ Fruit ____________________________________________ Meat ___________________________________________ Fat _____________________________________________ DAILY TOTALS Starches _________________________ Vegetable ________________________ Fruit ____________________________ Dairy ____________________________ Meat  ____________________________ Fat ______________________________ Document Released: 08/28/2005 Document Revised: 02/23/2012 Document Reviewed: 07/09/2009 [employer] Patient Information 2014 [place].

## 2014-03-14 NOTE — Progress Notes (Signed)
Patient here for follow up-dm Medication refill Complains of sores to his lower extremities

## 2014-03-22 ENCOUNTER — Other Ambulatory Visit: Payer: Self-pay

## 2014-03-22 DIAGNOSIS — E1140 Type 2 diabetes mellitus with diabetic neuropathy, unspecified: Secondary | ICD-10-CM

## 2014-03-22 MED ORDER — GABAPENTIN 400 MG PO CAPS: 400.0000 mg | ORAL_CAPSULE | Freq: Three times a day (TID) | ORAL | Status: DC

## 2014-05-01 ENCOUNTER — Emergency Department (HOSPITAL_BASED_OUTPATIENT_CLINIC_OR_DEPARTMENT_OTHER)
Admission: EM | Admit: 2014-05-01 | Discharge: 2014-05-01 | Discharge status: Against Medical Advice | Payer: Medicaid Other | Attending: Emergency Medicine | Admitting: Emergency Medicine

## 2014-05-01 DIAGNOSIS — J4489 Other specified chronic obstructive pulmonary disease: Secondary | ICD-10-CM | POA: Insufficient documentation

## 2014-05-01 DIAGNOSIS — Z5320 Procedure and treatment not carried out because of patient's decision for unspecified reasons: Secondary | ICD-10-CM | POA: Insufficient documentation

## 2014-05-01 DIAGNOSIS — I10 Essential (primary) hypertension: Secondary | ICD-10-CM | POA: Insufficient documentation

## 2014-05-01 DIAGNOSIS — J449 Chronic obstructive pulmonary disease, unspecified: Secondary | ICD-10-CM | POA: Insufficient documentation

## 2014-05-01 DIAGNOSIS — F17200 Nicotine dependence, unspecified, uncomplicated: Secondary | ICD-10-CM | POA: Insufficient documentation

## 2014-05-01 NOTE — ED Notes (Signed)
While walking back to an exam room, pt stated that "I just wanted my blood pressure checked. I feel fine now so I don't need to see a doctor." Pt ambulating unassisted and with steady gait. He does not appear to be in any distress.

## 2014-06-22 ENCOUNTER — Emergency Department (HOSPITAL_COMMUNITY): Payer: Medicaid Other

## 2014-06-22 ENCOUNTER — Encounter (HOSPITAL_COMMUNITY): Payer: Self-pay | Admitting: Emergency Medicine

## 2014-06-22 ENCOUNTER — Emergency Department (HOSPITAL_COMMUNITY)
Admission: EM | Admit: 2014-06-22 | Discharge: 2014-06-22 | Disposition: A | Discharge status: Home / Self Care | Payer: Medicaid Other | Attending: Emergency Medicine | Admitting: Emergency Medicine

## 2014-06-22 DIAGNOSIS — E1149 Type 2 diabetes mellitus with other diabetic neurological complication: Secondary | ICD-10-CM | POA: Insufficient documentation

## 2014-06-22 DIAGNOSIS — IMO0002 Reserved for concepts with insufficient information to code with codable children: Secondary | ICD-10-CM | POA: Diagnosis not present

## 2014-06-22 DIAGNOSIS — I10 Essential (primary) hypertension: Secondary | ICD-10-CM | POA: Diagnosis not present

## 2014-06-22 DIAGNOSIS — R1011 Right upper quadrant pain: Secondary | ICD-10-CM | POA: Diagnosis present

## 2014-06-22 DIAGNOSIS — J4489 Other specified chronic obstructive pulmonary disease: Secondary | ICD-10-CM | POA: Insufficient documentation

## 2014-06-22 DIAGNOSIS — Z9981 Dependence on supplemental oxygen: Secondary | ICD-10-CM | POA: Diagnosis not present

## 2014-06-22 DIAGNOSIS — Z8701 Personal history of pneumonia (recurrent): Secondary | ICD-10-CM | POA: Diagnosis not present

## 2014-06-22 DIAGNOSIS — R1031 Right lower quadrant pain: Secondary | ICD-10-CM | POA: Insufficient documentation

## 2014-06-22 DIAGNOSIS — R011 Cardiac murmur, unspecified: Secondary | ICD-10-CM | POA: Diagnosis not present

## 2014-06-22 DIAGNOSIS — Z79899 Other long term (current) drug therapy: Secondary | ICD-10-CM | POA: Insufficient documentation

## 2014-06-22 DIAGNOSIS — Z87448 Personal history of other diseases of urinary system: Secondary | ICD-10-CM | POA: Diagnosis not present

## 2014-06-22 DIAGNOSIS — Z9889 Other specified postprocedural states: Secondary | ICD-10-CM | POA: Diagnosis not present

## 2014-06-22 DIAGNOSIS — F411 Generalized anxiety disorder: Secondary | ICD-10-CM | POA: Diagnosis not present

## 2014-06-22 DIAGNOSIS — F3289 Other specified depressive episodes: Secondary | ICD-10-CM | POA: Insufficient documentation

## 2014-06-22 DIAGNOSIS — F17200 Nicotine dependence, unspecified, uncomplicated: Secondary | ICD-10-CM | POA: Diagnosis not present

## 2014-06-22 DIAGNOSIS — Z85118 Personal history of other malignant neoplasm of bronchus and lung: Secondary | ICD-10-CM | POA: Insufficient documentation

## 2014-06-22 DIAGNOSIS — R109 Unspecified abdominal pain: Secondary | ICD-10-CM

## 2014-06-22 DIAGNOSIS — G8929 Other chronic pain: Secondary | ICD-10-CM | POA: Insufficient documentation

## 2014-06-22 DIAGNOSIS — Z923 Personal history of irradiation: Secondary | ICD-10-CM | POA: Diagnosis not present

## 2014-06-22 DIAGNOSIS — Z862 Personal history of diseases of the blood and blood-forming organs and certain disorders involving the immune mechanism: Secondary | ICD-10-CM | POA: Insufficient documentation

## 2014-06-22 DIAGNOSIS — F329 Major depressive disorder, single episode, unspecified: Secondary | ICD-10-CM | POA: Diagnosis not present

## 2014-06-22 DIAGNOSIS — J449 Chronic obstructive pulmonary disease, unspecified: Secondary | ICD-10-CM | POA: Diagnosis not present

## 2014-06-22 DIAGNOSIS — E1142 Type 2 diabetes mellitus with diabetic polyneuropathy: Secondary | ICD-10-CM | POA: Diagnosis not present

## 2014-06-22 HISTORY — DX: Dependence on supplemental oxygen: Z99.81

## 2014-06-22 LAB — CBC WITH DIFFERENTIAL/PLATELET
Basophils Absolute: 0 10*3/uL (ref 0.0–0.1)
Basophils Relative: 0 % (ref 0–1)
EOS ABS: 0.1 10*3/uL (ref 0.0–0.7)
EOS PCT: 2 % (ref 0–5)
HCT: 46.7 % (ref 39.0–52.0)
Hemoglobin: 15.1 g/dL (ref 13.0–17.0)
LYMPHS PCT: 18 % (ref 12–46)
Lymphs Abs: 1.1 10*3/uL (ref 0.7–4.0)
MCH: 27 pg (ref 26.0–34.0)
MCHC: 32.3 g/dL (ref 30.0–36.0)
MCV: 83.5 fL (ref 78.0–100.0)
Monocytes Absolute: 0.6 10*3/uL (ref 0.1–1.0)
Monocytes Relative: 11 % (ref 3–12)
Neutro Abs: 4.2 10*3/uL (ref 1.7–7.7)
Neutrophils Relative %: 69 % (ref 43–77)
PLATELETS: 183 10*3/uL (ref 150–400)
RBC: 5.59 MIL/uL (ref 4.22–5.81)
RDW: 16.5 % — AB (ref 11.5–15.5)
WBC: 6 10*3/uL (ref 4.0–10.5)

## 2014-06-22 LAB — URINALYSIS, ROUTINE W REFLEX MICROSCOPIC
Bilirubin Urine: NEGATIVE
Glucose, UA: NEGATIVE mg/dL
HGB URINE DIPSTICK: NEGATIVE
Ketones, ur: NEGATIVE mg/dL
Leukocytes, UA: NEGATIVE
NITRITE: NEGATIVE
PROTEIN: NEGATIVE mg/dL
Specific Gravity, Urine: 1.01 (ref 1.005–1.030)
UROBILINOGEN UA: 0.2 mg/dL (ref 0.0–1.0)
pH: 5.5 (ref 5.0–8.0)

## 2014-06-22 LAB — COMPREHENSIVE METABOLIC PANEL
ALT: 22 U/L (ref 0–53)
AST: 20 U/L (ref 0–37)
Albumin: 3.8 g/dL (ref 3.5–5.2)
Alkaline Phosphatase: 89 U/L (ref 39–117)
Anion gap: 14 (ref 5–15)
BUN: 14 mg/dL (ref 6–23)
CALCIUM: 9.4 mg/dL (ref 8.4–10.5)
CO2: 23 meq/L (ref 19–32)
Chloride: 103 mEq/L (ref 96–112)
Creatinine, Ser: 1.27 mg/dL (ref 0.50–1.35)
GFR calc Af Amer: 71 mL/min — ABNORMAL LOW (ref >90)
GFR calc non Af Amer: 62 mL/min — ABNORMAL LOW (ref >90)
Glucose, Bld: 128 mg/dL — ABNORMAL HIGH (ref 70–99)
Potassium: 4.2 mEq/L (ref 3.7–5.3)
SODIUM: 140 meq/L (ref 137–147)
TOTAL PROTEIN: 7.3 g/dL (ref 6.0–8.3)
Total Bilirubin: 0.2 mg/dL — ABNORMAL LOW (ref 0.3–1.2)

## 2014-06-22 LAB — LIPASE, BLOOD: Lipase: 40 U/L (ref 11–59)

## 2014-06-22 MED ORDER — IOHEXOL 300 MG/ML  SOLN
100.0000 mL | Freq: Once | INTRAMUSCULAR | Status: AC | PRN
  Administered 2014-06-22: 100 mL via INTRAVENOUS

## 2014-06-22 MED ORDER — SODIUM CHLORIDE 0.9 % IJ SOLN
INTRAMUSCULAR | Status: AC
  Filled 2014-06-22: qty 50

## 2014-06-22 MED ORDER — HYDROCODONE-ACETAMINOPHEN 5-325 MG PO TABS: ORAL_TABLET | ORAL | Status: DC

## 2014-06-22 MED ORDER — MORPHINE SULFATE 4 MG/ML IJ SOLN
4.0000 mg | INTRAMUSCULAR | Status: DC | PRN
  Administered 2014-06-22: 4 mg via INTRAVENOUS
  Filled 2014-06-22: qty 1

## 2014-06-22 MED ORDER — SODIUM CHLORIDE 0.9 % IV SOLN
INTRAVENOUS | Status: DC
  Administered 2014-06-22: 100 mL/h via INTRAVENOUS

## 2014-06-22 MED ORDER — ONDANSETRON HCL 4 MG/2ML IJ SOLN
4.0000 mg | INTRAMUSCULAR | Status: DC | PRN
  Administered 2014-06-22: 4 mg via INTRAVENOUS
  Filled 2014-06-22: qty 2

## 2014-06-22 MED ORDER — IOHEXOL 300 MG/ML  SOLN
25.0000 mL | Freq: Once | INTRAMUSCULAR | Status: AC | PRN
  Administered 2014-06-22: 25 mL via ORAL

## 2014-06-22 NOTE — ED Notes (Signed)
Per pt sts right sided abdominal and flank pain x 3 days. Denies N,V,D. sts LBM 2 days ago.

## 2014-06-22 NOTE — ED Notes (Signed)
Patient transported to X-ray 

## 2014-06-22 NOTE — Discharge Instructions (Signed)
°Emergency Department Resource Guide °1) Find a Doctor and Pay Out of Pocket °Although you won't have to find out who is covered by your insurance plan, it is a good idea to ask around and get recommendations. You will then need to call the office and see if the doctor you have chosen will accept you as a new patient and what types of options they offer for patients who are self-pay. Some doctors offer discounts or will set up payment plans for their patients who do not have insurance, but you will need to ask so you aren't surprised when you get to your appointment. ° °2) Contact Your Local Health Department °Not all health departments have doctors that can see patients for sick visits, but many do, so it is worth a call to see if yours does. If you don't know where your local health department is, you can check in your phone book. The CDC also has a tool to help you locate your state's health department, and many state websites also have listings of all of their local health departments. ° °3) Find a Walk-in Clinic °If your illness is not likely to be very severe or complicated, you may want to try a walk in clinic. These are popping up all over the country in pharmacies, drugstores, and shopping centers. They're usually staffed by nurse practitioners or physician assistants that have been trained to treat common illnesses and complaints. They're usually fairly quick and inexpensive. However, if you have serious medical issues or chronic medical problems, these are probably not your best option. ° °No Primary Care Doctor: °- Call [facility] Connect at  832-8000 - they can help you locate a primary care doctor that  accepts your insurance, provides certain services, etc. °- Physician Referral Service- 1-800-533-3463 ° °Chronic Pain Problems: °Organization         Address  Phone   Notes  °[facility]  ([phone]) [phone] Patients need to be referred by their primary care doctor.  ° °Medication  Assistance: °Organization         Address  Phone   Notes  °[place] [facility] [address] °[place], [place] [address] (336) 641-8030 --Must be a resident of [place] °-- Must have NO insurance coverage whatsoever (no Medicaid/ Medicare, etc.) °-- The pt. MUST have a primary care doctor that directs their care regularly and follows them in the community °  °[facility]  (866) 331-1348   °[employer]  (888) 892-1162   ° °Agencies that provide inexpensive medical care: °Organization         Address  Phone   Notes  °[facility]  (336) 832-8035   °[facility]    (336) 832-7272   °[facility] [address] °[place], [place] [address] (336) 832-4777   °[facility] [address], °[place] (336) 271-4999   °[facility]    (336) 373-0678   °[facility]    (336) 272-1050   °[facility] ° [address], [place] Phone:  (336) 832-4444, Fax:  (336) 832-4440 Hours of Operation:  9 am - 6 pm, M-F.  Also accepts Medicaid/Medicare and self-pay.  °[facility] ° [address], [place] Phone: (336) [phone], Fax: ([phone]) [phone]. Hours of Operation:  8:30 am - 5:30 pm, M-F.  Also accepts Medicaid and self-pay.  °[facility] [place] [address], [place] Phone: (336) 878-6027   °[facility] [address], [place], [place] (336)723-1848, Ext. 123 Mondays & Thursdays: 7-9 AM.  First 15 patients are seen on a first come, first serve basis. °  ° °Medicaid-accepting [place] Providers: ° °Organization         Address  Phone   Notes  °[facility] [address], [place] (336) [phone] Also accepts self-pay patients.  °[facility] [address], [place] ° (336) [phone]   °[facility] [address], [place]  (336) [phone]   °[facility] [address] [place] [address], [place] (336) 299-7000   °Veita Bland [address], [place]  ° (336) [phone] Only accepts [place] Access Medicaid patients after they have their name applied to their card.  ° °Self-Pay (no insurance) in [place]: ° °Organization         Address  Phone   Notes  °Sickle Cell Patients, [facility] [address], [place] (336) [phone]   °[facility] [address], [place] (336) 832-4400   ° [facility] ° [address], [place] (336) 992-4800   °[facility]/Dr. Osei-Bonsu ° [address] [place] [address], [place] or [address], [place] (336) [phone] Phone number for both [place] and [place] locations is the same.  °[facility] [address], [place] (336) 299-0000   °[facility] [address] [place] [address], [place] or [address] (336) 852-7530 °(336) 878-2260   °[facility] [address], [place] (336) 350-1642, phone; (336) 294-5005, fax Sees patients 1st and 3rd Saturday of every month.  Must not qualify for public or private insurance (i.e. Medicaid, Medicare, [place] Health Choice, Veterans' Benefits) • Household income should be no more than 200% of the poverty level •The clinic cannot treat you if you are pregnant or think you are pregnant • Sexually transmitted diseases are not treated at the clinic.  ° ° °Dental Care: °Organization         Address  Phone  Notes  °[place] [facility] [address],  (336) 641-6152 Accepts children up to age 21 who are enrolled in Medicaid or [place] Health Choice; pregnant women with a Medicaid card; and children who have applied for Medicaid or  Health Choice, but were declined, whose parents can pay a reduced fee at time of service.  °[place]  [facility] [place]  [address], [place] (336) 641-7733 Accepts children up to age 21 who are enrolled in Medicaid or  Health Choice; pregnant women with a Medicaid card; and children who have applied for Medicaid or  Health Choice, but were declined, whose parents can pay a reduced fee at time of service.  °[facility] ° [address],  (336) [phone] Patients are seen by appointment only. Walk-ins are not accepted. [facility] will see patients 18 years of age and older. °Monday - Tuesday (8am-5pm) °Most Wednesdays (8:30-5pm) °$30 per visit, cash only  °[facility] ° [address], [place] (336) [phone] Patients are seen by appointment only. Walk-ins are not accepted. [facility] will see patients 18 years of age and older. °One   Wednesday Evening (Monthly: Volunteer Based).  $30 per visit, cash only  °[facility]  (919) [phone] for adults; Children under age 4, call Graduate Pediatric Dentistry at ([phone]) [phone]. Children aged 4-14, please call ([phone]) [phone] to request a pediatric application. ° Dental services are provided in all areas of dental care including fillings, crowns and bridges, complete and partial dentures, implants, gum treatment, root canals, and extractions. Preventive care is also provided. Treatment is provided to both adults and children. °Patients are selected via a lottery and there is often a waiting list. °  °[facility] [address], °[place] ° (336) 763-8833 [email or web address] °  °[facility] [address], [place], [place] (336)723-1848, Ext. 123 Second and Fourth Thursday of each month, opens at 6:30 AM; Clinic ends at 9 AM.  Patients are seen on a first-come first-served basis, and a limited number are seen during each clinic.  ° °[facility] ° [address], [place], [place] (336) [phone]    Eligibility Requirements °You must have lived in [place], [place], or [place] counties for at least the last three months. °  You cannot be eligible for state or federal sponsored healthcare insurance, including Veterans Administration, Medicaid, or Medicare. °  You generally cannot be eligible for healthcare insurance through your employer.  °  How to apply: °Eligibility screenings are held every Tuesday and Wednesday afternoon from 1:00 pm until 4:00 pm. You do not need an appointment for the interview!  °[facility] [address], [place], [place] 336-631-2330   °[facility]  336-342-8273   °[facility]  336-703-3100   °[facility]  336-570-6415   ° °Behavioral Health Resources in the Community: °Intensive Outpatient Programs °Organization         Address  Phone  Notes  °[facility] [address], [place], [place] 336-878-6098   °[facility] [address], [place], [place] 336-832-9800   °[facility] [address], [place], [place] ° 336-882-2125   °[facility] [address],  °[place], [place] 1-800-853-5163 or 336-641-4981   °Substance Abuse Resources °Organization         Address  Phone  Notes  °[facility]  336-882-2125   °[facility]  336-784-9470   °[facility]  336-285-9073   °[facility]  336-845-3988   °Residential & Outpatient Substance Abuse Program  1-800-659-3381   °Psychological Services °Organization         Address  Phone  Notes  °[facility]  336- 832-9600   °[facility]  336- 378-7881   °[facility] [address], [place] 1-800-853-5163 or 336-641-4981   ° °Mobile Crisis Teams °Organization         Address  Phone  Notes  °[facility], Mobile Crisis Care Unit  1-877-626-1772   °Assertive °[facility] ° [address]  [place], [place] 336-834-9664   °Sharon DeEsch [address] °[place] 336-554-5454   ° °Self-Help/Support Groups °Organization         Address  Phone             Notes  °[facility] - variety of support groups  336- 373-1402 Call for more information  °Narcotics Anonymous (NA), [facility] [address], °[place]  2 meetings at this location  ° °  Residential Treatment Programs Organization         Address  Phone  Notes  [facility] [address]    [place]  1-225-649-4535   [facility]  [address], [place] [address], [place], [place]   [place], [place] [phone] Admissions: 8am-3pm M-F  [facility] [place] [address],    [place], [place] [phone]   [facility] [address] [place], [place], [place]   [facility] [address]  [place], [place]   [facility] - Intensive Outpatient [address], [place], [place]   [facility] [address]  [place], [place] 1-3101638994 or 920-668-4945   [facility] [address] [place], [place] Accepts Medicaid  [facility] [place] [address],  [place] 1-(249)126-0293 Substance Abuse/Addiction Treatment   [facility]         Address  Phone  Notes  [facility]  814-209-6492   Domenic Schwab, PhD [address] [place], [place]   725-529-2901 or 705-784-8186   [place], [place] (272) [phone]   [facility] [address] [place], [place] (254) [phone] Insurance/Medicaid/sponsorship through [facility] [address] [place]                                    [place], [place] (601) [phone] [place] [address], [place] [phone]    Dr. Adele Schilder  [phone]   [facility] Dept. 1) [address], [place] 2) [place] 3)  [address], [place] (228) [phone]) [phone]  [phone]   [place] 343-063-1196 or ([phone] (After Hours)       Take the prescription as directed.  Call your regular medical doctor today to schedule a follow up appointment within the next 2 days.  Return to the Emergency Department immediately sooner if worsening.

## 2014-06-22 NOTE — ED Provider Notes (Signed)
CSN: [id number]     Arrival date & time 06/22/14  0818 History   First MD Initiated Contact with Patient 06/22/14 [phone]     Chief Complaint  Patient presents with  . Abdominal Pain      HPI Pt was seen at [place].  Per pt, c/o gradual onset and worsening of persistent abd "pain" for the past 3 to 4 days. States the pain was generalized, now is located on the right side. Last BM 2 days ago was "normal" per pt. Denies N/V, no diarrhea, no fevers, no back pain, no rash, no CP/SOB, no black or blood in stools.      Past Medical History  Diagnosis Date  . Back pain, chronic   . Mediastinal mass   . Urinary retention   . Lung cancer 04/07/2013  . Diabetic neuropathy   . Diabetes mellitus, type II     takes Gimepimide and Metformin daily  . Enlarged prostate     takes Flomax every evening  . Hypertension     slightly elevated recently;no meds;just watching for now  . COPD (chronic obstructive pulmonary disease)     emphysema  . Shortness of breath     with exertion  . Pneumonia     hx of;last time about 106yrs ago  . History of bronchitis     last time 10yrs ago  . Joint pain     stiffness and cramping in both hands  . Urinary frequency   . Nocturia   . Anemia     no meds and hasn't had a transfusion ever  . Anxiety   . Depression     no meds required;hospitatlized 54yrs ago for this  . Hx of radiation therapy 04/19/13- 06/07/13    left upper central chest, 63 gray 35 fx  . Heart murmur     as a baby  . Diarrhea   . Neuropathy   . Back pain 11/14/2013  . On home O2     2L N/C   Past Surgical History  Procedure Laterality Date  . Video bronchoscopy with endobronchial ultrasound Bilateral 03/10/2013    Procedure: VIDEO BRONCHOSCOPY WITH ENDOBRONCHIAL ULTRASOUND;  Surgeon: Rigoberto Noel, MD;  Location: [place];  Service: Pulmonary;  Laterality: Bilateral;  . Mediastinotomy chamberlain mcneil Left 03/24/2013    Procedure: MEDIASTINOTOMY CHAMBERLAIN MCNEIL;  Surgeon: Melrose Nakayama, MD;  Location: [place];  Service: Thoracic;  Laterality: Left;  . Excisional hemorrhoidectomy      > 30 yrs ago  . Colonoscopy    . Direct laryngoscopy N/A 07/01/2013    Procedure: DIRECT LARYNGOSCOPY;  Surgeon: Izora Gala, MD;  Location: [place];  Service: ENT;  Laterality: N/A;  . Esophagoscopy N/A 07/01/2013    Procedure: ESOPHAGOSCOPY WITH BIOPSY;  Surgeon: Izora Gala, MD;  Location: [place];  Service: ENT;  Laterality: N/A;  . Direct laryngoscopy N/A 08/01/2013    Procedure: DIRECT LARYNGOSCOPY WITH EXCISION OF PHARYNGEAL MASS;  Surgeon: Izora Gala, MD;  Location: [place];  Service: ENT;  Laterality: N/A;  . Tonsillectomy Right 08/01/2013    Procedure: TONSILLECTOMY;  Surgeon: Izora Gala, MD;  Location: [facility] OR;  Service: ENT;  Laterality: Right;   Family History  Problem Relation Age of Onset  . Aneurysm Mother   . Heart attack Father   . Aneurysm Sister   . Cancer Brother    History  Substance Use Topics  . Smoking status: Current Every Day Smoker -- 0.50 packs/day for 41 years  Types: Cigarettes    Start date: 07/18/1971  . Smokeless tobacco: Never Used     Comment: nicotene patch  . Alcohol Use: 8.4 oz/week    14 Cans of beer per week     Comment: daily beer 2    Review of Systems ROS: Statement: All systems negative except as marked or noted in the HPI; Constitutional: Negative for fever and chills. ; ; Eyes: Negative for eye pain, redness and discharge. ; ; ENMT: Negative for ear pain, hoarseness, nasal congestion, sinus pressure and sore throat. ; ; Cardiovascular: Negative for chest pain, palpitations, diaphoresis, dyspnea and peripheral edema. ; ; Respiratory: Negative for cough, wheezing and stridor. ; ; Gastrointestinal: +abd pain. Negative for nausea, vomiting, diarrhea, blood in stool, hematemesis, jaundice and rectal bleeding. . ; ; Genitourinary: Negative for dysuria, flank pain and hematuria. ; ; Musculoskeletal: Negative for back pain and neck pain.  Negative for swelling and trauma.; ; Skin: Negative for pruritus, rash, abrasions, blisters, bruising and skin lesion.; ; Neuro: Negative for headache, lightheadedness and neck stiffness. Negative for weakness, altered level of consciousness , altered mental status, extremity weakness, paresthesias, involuntary movement, seizure and syncope.      Allergies  Review of patient's allergies indicates no known allergies.  Home Medications   Prior to Admission medications   Medication Sig Start Date End Date Taking? Authorizing Provider  gabapentin (NEURONTIN) 400 MG capsule Take 1 capsule (400 mg total) by mouth 3 (three) times daily. 03/22/14  Yes Lorayne Marek, MD  glimepiride (AMARYL) 4 MG tablet Take 1 tablet (4 mg total) by mouth daily before breakfast. 03/14/14  Yes Deepak Advani, MD  hydrocortisone 2.5 % cream Apply topically 2 (two) times daily. 03/14/14  Yes Lorayne Marek, MD  Lancets (ACCU-CHEK MULTICLIX) lancets Use as instructed 12/21/13  Yes Heath Lark, MD  ramipril (ALTACE) 2.5 MG capsule Take 1 capsule (2.5 mg total) by mouth daily. 03/14/14  Yes Lorayne Marek, MD  simvastatin (ZOCOR) 20 MG tablet Take 1 tablet (20 mg total) by mouth at bedtime. 12/30/13  Yes Reyne Dumas, MD   BP 143/73  Pulse 83  Temp(Src) 97.5 F (36.4 C) (Oral)  Resp 21  SpO2 86% Physical Exam 0830: Physical examination:  Nursing notes reviewed; Vital signs and O2 SAT reviewed;  Constitutional: Well developed, Well nourished, Well hydrated, Uncomfortable appearing.; Head:  Normocephalic, atraumatic; Eyes: EOMI, PERRL, No scleral icterus; ENMT: Mouth and pharynx normal, Mucous membranes moist; Neck: Supple, Full range of motion, No lymphadenopathy; Cardiovascular: Regular rate and rhythm, No gallop; Respiratory: Breath sounds clear & equal bilaterally, No rales, rhonchi, wheezes.  Speaking full sentences with ease, Normal respiratory effort/excursion; Chest: Nontender, Movement normal; Abdomen: Soft, +RUQ and RLQ  tender to palp. Nondistended, Normal bowel sounds; Genitourinary: No CVA tenderness; Extremities: Pulses normal, No tenderness, No edema, No calf edema or asymmetry.; Neuro: AA&Ox3, Major CN grossly intact.  Speech clear. No gross focal motor or sensory deficits in extremities. Climbs on and off stretcher easily by himself. Gait steady.; Skin: Color normal, Warm, Dry.   ED Course  Procedures     EKG Interpretation None      MDM  MDM Reviewed: previous chart, nursing note and vitals Reviewed previous: labs and CT scan Interpretation: labs, CT scan and x-ray     Results for orders placed during the hospital encounter of 06/22/14  URINALYSIS, ROUTINE W REFLEX MICROSCOPIC      Result Value Ref Range   Color, Urine YELLOW  YELLOW   APPearance  CLEAR  CLEAR   Specific Gravity, Urine 1.010  1.005 - 1.030   pH 5.5  5.0 - 8.0   Glucose, UA NEGATIVE  NEGATIVE mg/dL   Hgb urine dipstick NEGATIVE  NEGATIVE   Bilirubin Urine NEGATIVE  NEGATIVE   Ketones, ur NEGATIVE  NEGATIVE mg/dL   Protein, ur NEGATIVE  NEGATIVE mg/dL   Urobilinogen, UA 0.2  0.0 - 1.0 mg/dL   Nitrite NEGATIVE  NEGATIVE   Leukocytes, UA NEGATIVE  NEGATIVE  CBC WITH DIFFERENTIAL      Result Value Ref Range   WBC 6.0  4.0 - 10.5 K/uL   RBC 5.59  4.22 - 5.81 MIL/uL   Hemoglobin 15.1  13.0 - 17.0 g/dL   HCT 46.7  39.0 - 52.0 %   MCV 83.5  78.0 - 100.0 fL   MCH 27.0  26.0 - 34.0 pg   MCHC 32.3  30.0 - 36.0 g/dL   RDW 16.5 (*) 11.5 - 15.5 %   Platelets 183  150 - 400 K/uL   Neutrophils Relative % 69  43 - 77 %   Neutro Abs 4.2  1.7 - 7.7 K/uL   Lymphocytes Relative 18  12 - 46 %   Lymphs Abs 1.1  0.7 - 4.0 K/uL   Monocytes Relative 11  3 - 12 %   Monocytes Absolute 0.6  0.1 - 1.0 K/uL   Eosinophils Relative 2  0 - 5 %   Eosinophils Absolute 0.1  0.0 - 0.7 K/uL   Basophils Relative 0  0 - 1 %   Basophils Absolute 0.0  0.0 - 0.1 K/uL  COMPREHENSIVE METABOLIC PANEL      Result Value Ref Range   Sodium 140  137 -  147 mEq/L   Potassium 4.2  3.7 - 5.3 mEq/L   Chloride 103  96 - 112 mEq/L   CO2 23  19 - 32 mEq/L   Glucose, Bld 128 (*) 70 - 99 mg/dL   BUN 14  6 - 23 mg/dL   Creatinine, Ser 1.27  0.50 - 1.35 mg/dL   Calcium 9.4  8.4 - 10.5 mg/dL   Total Protein 7.3  6.0 - 8.3 g/dL   Albumin 3.8  3.5 - 5.2 g/dL   AST 20  0 - 37 U/L   ALT 22  0 - 53 U/L   Alkaline Phosphatase 89  39 - 117 U/L   Total Bilirubin 0.2 (*) 0.3 - 1.2 mg/dL   GFR calc non Af Amer 62 (*) >90 mL/min   GFR calc Af Amer 71 (*) >90 mL/min   Anion gap 14  5 - 15  LIPASE, BLOOD      Result Value Ref Range   Lipase 40  11 - 59 U/L   Dg Chest 2 View 06/22/2014   CLINICAL DATA:  Chest pain and shortness of breath.  EXAM: CHEST  2 VIEW  COMPARISON:  PA and lateral chest 10/13/2011. Single view of the chest 03/25/2013. CT chest 02/22/2014.  FINDINGS: Bullous emphysematous disease is again seen. Perihilar scarring on the left consistent with prior radiation therapy is again identified. There is no new airspace disease or nodule. No pneumothorax or pleural effusion is identified.  IMPRESSION: No acute abnormality.  Post treatment change left upper lobe.  Emphysema.   Electronically Signed   By: Inge Rise M.D.   On: 06/22/2014 09:10   Ct Abdomen Pelvis W Contrast 06/22/2014   CLINICAL DATA:  Right-sided mid abdominal pain for 4  days. Past Medical history is significant for lung cancer.  EXAM: CT ABDOMEN AND PELVIS WITH CONTRAST  TECHNIQUE: Multidetector CT imaging of the abdomen and pelvis was performed using the standard protocol following bolus administration of intravenous contrast.  CONTRAST:  183mL OMNIPAQUE IOHEXOL 300 MG/ML  SOLN  COMPARISON:  Lumbar spine MRI 01/10/2014; prior PET-CT 03/15/2013; prior CT abdomen/ pelvis with contrast 03/08/2013  FINDINGS: Lower Chest: Respiratory motion slightly limits evaluation of the lung bases. However, there is a combination of atelectasis and consolidation in the posteromedial right lower lobe  superimposed on a background of emphysema. More mild atelectasis and chronic scarring present in the visualized left lower lobe. Stable 5 mm nodule in the right middle lobe (image 7 series 3). Visualized cardiac structures remain within normal limits for size. No pericardial effusion. Calcification of the aortic valve noted incidentally. Unremarkable visualized distal thoracic aorta.  Abdomen: Punctate calcification within the wall of the gastric fundus remains unchanged dating back to March of 2014 and is likely not clinically significant. Unremarkable appearance of the duodenum, spleen, adrenal glands, and pancreas.  Continued stability of indeterminate hypo attenuating lesions throughout the right hepatic lobe dating back to March 08, 2013. These are not hypermetabolic on the prior PET-CT. Numerous gas containing stones layer within the gallbladder lumen. No secondary signs to suggest acute cholecystitis. No intra or extrahepatic biliary ductal dilatation.  No enhancing renal lesion, nephrolithiasis or hydronephrosis. No significant interval change in a multiple low-attenuation cystic lesions throughout both kidneys. Many of the lesions 2 small for accurate characterization but remain unchanged in size, number and configuration. The largest lesion in the lower pole the left kidney remains consistent with a simple cyst.  No evidence of obstruction or focal bowel wall thickening. Normal appendix in the right lower quadrant. The terminal ileum is unremarkable. No suspicious adenopathy or free fluid.  Pelvis: Marked prostatomegaly. The prostate gland measures 6.2 x 6.7 x approximately 5.7 cm yielding an estimated volume of 118 cc.  Bones/Soft Tissues: No acute fracture or aggressive appearing lytic or blastic osseous lesion.  Vascular: Atherosclerotic vascular disease without significant stenosis or aneurysmal dilatation.  IMPRESSION: 1. No acute abnormality to explain the patient's clinical symptoms. 2.  Cholelithiasis. 3. Marked prostatomegaly.  The estimated prostatic volume is 118 cc. 4. Stable indeterminate hepatic lesions dating back to 03/08/2013. These lesions were not hypermetabolic on the prior PET-CT. 5. Stable right middle lobe 5 mm pulmonary nodule. 6. Additional ancillary findings as above without significant interval change.   Electronically Signed   By: Jacqulynn Cadet M.D.   On: 06/22/2014 11:16    1205:  Pt has tol PO well without N/V. No stooling while in the ED. Has been ambulatory with steady upright gait, easy resps. Feels better after meds and wants to go home now. Dx and testing d/w pt.  Questions answered.  Verb understanding, agreeable to d/c home with outpt f/u.   Alfonzo Feller, DO 06/24/14 1407

## 2014-06-22 NOTE — ED Notes (Signed)
Brought pt to room; pt undressed, in gown, on continuous pulse oximetry and blood pressure cuff; Thurnell Garbe, MD present in room

## 2014-06-23 LAB — URINE CULTURE
Colony Count: NO GROWTH
Culture: NO GROWTH

## 2014-07-12 ENCOUNTER — Ambulatory Visit: Payer: Medicaid Other | Admitting: Internal Medicine

## 2014-07-13 ENCOUNTER — Ambulatory Visit
Admit: 2014-07-13 | Discharge: 2014-07-13 | Disposition: A | Discharge status: Home / Self Care | Payer: Medicaid Other | Attending: Radiation Oncology | Admitting: Radiation Oncology

## 2014-07-13 ENCOUNTER — Encounter (HOSPITAL_COMMUNITY): Payer: Self-pay | Admitting: Emergency Medicine

## 2014-07-13 ENCOUNTER — Emergency Department (HOSPITAL_COMMUNITY): Payer: Medicaid Other

## 2014-07-13 ENCOUNTER — Inpatient Hospital Stay (HOSPITAL_COMMUNITY)
Admission: EM | Admit: 2014-07-13 | Discharge: 2014-07-15 | DRG: 054 | Disposition: A | Discharge status: Home / Self Care | Payer: Medicaid Other | Attending: Internal Medicine | Admitting: Internal Medicine

## 2014-07-13 ENCOUNTER — Inpatient Hospital Stay (HOSPITAL_COMMUNITY): Payer: Medicaid Other

## 2014-07-13 DIAGNOSIS — C7931 Secondary malignant neoplasm of brain: Secondary | ICD-10-CM | POA: Diagnosis present

## 2014-07-13 DIAGNOSIS — Z923 Personal history of irradiation: Secondary | ICD-10-CM

## 2014-07-13 DIAGNOSIS — Z79899 Other long term (current) drug therapy: Secondary | ICD-10-CM | POA: Diagnosis not present

## 2014-07-13 DIAGNOSIS — Z720 Tobacco use: Secondary | ICD-10-CM | POA: Diagnosis present

## 2014-07-13 DIAGNOSIS — R55 Syncope and collapse: Secondary | ICD-10-CM

## 2014-07-13 DIAGNOSIS — I10 Essential (primary) hypertension: Secondary | ICD-10-CM | POA: Diagnosis present

## 2014-07-13 DIAGNOSIS — F1010 Alcohol abuse, uncomplicated: Secondary | ICD-10-CM | POA: Diagnosis present

## 2014-07-13 DIAGNOSIS — G936 Cerebral edema: Secondary | ICD-10-CM | POA: Diagnosis present

## 2014-07-13 DIAGNOSIS — C7949 Secondary malignant neoplasm of other parts of nervous system: Secondary | ICD-10-CM | POA: Diagnosis not present

## 2014-07-13 DIAGNOSIS — M549 Dorsalgia, unspecified: Secondary | ICD-10-CM | POA: Diagnosis present

## 2014-07-13 DIAGNOSIS — R29898 Other symptoms and signs involving the musculoskeletal system: Secondary | ICD-10-CM | POA: Diagnosis present

## 2014-07-13 DIAGNOSIS — E1149 Type 2 diabetes mellitus with other diabetic neurological complication: Secondary | ICD-10-CM | POA: Diagnosis present

## 2014-07-13 DIAGNOSIS — Z9981 Dependence on supplemental oxygen: Secondary | ICD-10-CM | POA: Diagnosis not present

## 2014-07-13 DIAGNOSIS — F17200 Nicotine dependence, unspecified, uncomplicated: Secondary | ICD-10-CM | POA: Diagnosis present

## 2014-07-13 DIAGNOSIS — J438 Other emphysema: Secondary | ICD-10-CM | POA: Diagnosis present

## 2014-07-13 DIAGNOSIS — E1142 Type 2 diabetes mellitus with diabetic polyneuropathy: Secondary | ICD-10-CM | POA: Diagnosis present

## 2014-07-13 DIAGNOSIS — Z9221 Personal history of antineoplastic chemotherapy: Secondary | ICD-10-CM | POA: Diagnosis not present

## 2014-07-13 DIAGNOSIS — Z85118 Personal history of other malignant neoplasm of bronchus and lung: Secondary | ICD-10-CM | POA: Diagnosis not present

## 2014-07-13 DIAGNOSIS — Z23 Encounter for immunization: Secondary | ICD-10-CM

## 2014-07-13 DIAGNOSIS — E119 Type 2 diabetes mellitus without complications: Secondary | ICD-10-CM | POA: Diagnosis present

## 2014-07-13 DIAGNOSIS — N179 Acute kidney failure, unspecified: Secondary | ICD-10-CM

## 2014-07-13 DIAGNOSIS — G8929 Other chronic pain: Secondary | ICD-10-CM | POA: Diagnosis present

## 2014-07-13 DIAGNOSIS — C799 Secondary malignant neoplasm of unspecified site: Secondary | ICD-10-CM

## 2014-07-13 DIAGNOSIS — G9389 Other specified disorders of brain: Secondary | ICD-10-CM | POA: Diagnosis present

## 2014-07-13 DIAGNOSIS — C3490 Malignant neoplasm of unspecified part of unspecified bronchus or lung: Secondary | ICD-10-CM | POA: Diagnosis present

## 2014-07-13 LAB — RAPID URINE DRUG SCREEN, HOSP PERFORMED
AMPHETAMINES: NOT DETECTED
BENZODIAZEPINES: NOT DETECTED
Barbiturates: NOT DETECTED
COCAINE: NOT DETECTED
OPIATES: NOT DETECTED
TETRAHYDROCANNABINOL: NOT DETECTED

## 2014-07-13 LAB — I-STAT CHEM 8, ED
BUN: 7 mg/dL (ref 6–23)
CHLORIDE: 105 meq/L (ref 96–112)
Calcium, Ion: 1.19 mmol/L (ref 1.12–1.23)
Creatinine, Ser: 1.2 mg/dL (ref 0.50–1.35)
Glucose, Bld: 106 mg/dL — ABNORMAL HIGH (ref 70–99)
HCT: 49 % (ref 39.0–52.0)
Hemoglobin: 16.7 g/dL (ref 13.0–17.0)
POTASSIUM: 3.7 meq/L (ref 3.7–5.3)
Sodium: 144 mEq/L (ref 137–147)
TCO2: 20 mmol/L (ref 0–100)

## 2014-07-13 LAB — DIFFERENTIAL
BASOS ABS: 0 10*3/uL (ref 0.0–0.1)
BASOS PCT: 1 % (ref 0–1)
EOS ABS: 0.1 10*3/uL (ref 0.0–0.7)
Eosinophils Relative: 2 % (ref 0–5)
Lymphocytes Relative: 23 % (ref 12–46)
Lymphs Abs: 1 10*3/uL (ref 0.7–4.0)
MONO ABS: 0.3 10*3/uL (ref 0.1–1.0)
Monocytes Relative: 7 % (ref 3–12)
NEUTROS ABS: 2.9 10*3/uL (ref 1.7–7.7)
Neutrophils Relative %: 67 % (ref 43–77)

## 2014-07-13 LAB — URINALYSIS, ROUTINE W REFLEX MICROSCOPIC
BILIRUBIN URINE: NEGATIVE
GLUCOSE, UA: NEGATIVE mg/dL
HGB URINE DIPSTICK: NEGATIVE
KETONES UR: NEGATIVE mg/dL
Leukocytes, UA: NEGATIVE
Nitrite: NEGATIVE
PROTEIN: NEGATIVE mg/dL
Specific Gravity, Urine: 1.01 (ref 1.005–1.030)
UROBILINOGEN UA: 0.2 mg/dL (ref 0.0–1.0)
pH: 5.5 (ref 5.0–8.0)

## 2014-07-13 LAB — CBC
HCT: 43.9 % (ref 39.0–52.0)
Hemoglobin: 14.6 g/dL (ref 13.0–17.0)
MCH: 27.5 pg (ref 26.0–34.0)
MCHC: 33.3 g/dL (ref 30.0–36.0)
MCV: 82.8 fL (ref 78.0–100.0)
Platelets: 171 10*3/uL (ref 150–400)
RBC: 5.3 MIL/uL (ref 4.22–5.81)
RDW: 15.9 % — AB (ref 11.5–15.5)
WBC: 4.2 10*3/uL (ref 4.0–10.5)

## 2014-07-13 LAB — ETHANOL: ALCOHOL ETHYL (B): 41 mg/dL — AB (ref 0–11)

## 2014-07-13 LAB — COMPREHENSIVE METABOLIC PANEL
ALBUMIN: 3.5 g/dL (ref 3.5–5.2)
ALT: 16 U/L (ref 0–53)
AST: 17 U/L (ref 0–37)
Alkaline Phosphatase: 77 U/L (ref 39–117)
Anion gap: 18 — ABNORMAL HIGH (ref 5–15)
BUN: 8 mg/dL (ref 6–23)
CHLORIDE: 104 meq/L (ref 96–112)
CO2: 19 mEq/L (ref 19–32)
CREATININE: 1.13 mg/dL (ref 0.50–1.35)
Calcium: 9.1 mg/dL (ref 8.4–10.5)
GFR calc Af Amer: 82 mL/min — ABNORMAL LOW (ref >90)
GFR calc non Af Amer: 71 mL/min — ABNORMAL LOW (ref >90)
Glucose, Bld: 103 mg/dL — ABNORMAL HIGH (ref 70–99)
Potassium: 3.8 mEq/L (ref 3.7–5.3)
Sodium: 141 mEq/L (ref 137–147)
TOTAL PROTEIN: 6.9 g/dL (ref 6.0–8.3)
Total Bilirubin: 0.3 mg/dL (ref 0.3–1.2)

## 2014-07-13 LAB — I-STAT TROPONIN, ED: Troponin i, poc: 0 ng/mL (ref 0.00–0.08)

## 2014-07-13 LAB — APTT: APTT: 28 s (ref 24–37)

## 2014-07-13 LAB — PROTIME-INR
INR: 1.04 (ref 0.00–1.49)
Prothrombin Time: 13.6 seconds (ref 11.6–15.2)

## 2014-07-13 MED ORDER — GADOBENATE DIMEGLUMINE 529 MG/ML IV SOLN
20.0000 mL | Freq: Once | INTRAVENOUS | Status: AC | PRN
  Administered 2014-07-13: 20 mL via INTRAVENOUS

## 2014-07-13 MED ORDER — ALBUTEROL SULFATE (2.5 MG/3ML) 0.083% IN NEBU: 2.5000 mg | INHALATION_SOLUTION | RESPIRATORY_TRACT | Status: DC | PRN

## 2014-07-13 MED ORDER — RAMIPRIL 2.5 MG PO CAPS
2.5000 mg | ORAL_CAPSULE | Freq: Every day | ORAL | Status: DC
  Administered 2014-07-14 – 2014-07-15 (×2): 2.5 mg via ORAL
  Filled 2014-07-13 (×2): qty 1

## 2014-07-13 MED ORDER — ACETAMINOPHEN 650 MG RE SUPP: 650.0000 mg | Freq: Four times a day (QID) | RECTAL | Status: DC | PRN

## 2014-07-13 MED ORDER — INSULIN ASPART 100 UNIT/ML ~~LOC~~ SOLN
0.0000 [IU] | Freq: Three times a day (TID) | SUBCUTANEOUS | Status: DC
  Administered 2014-07-14 (×2): 5 [IU] via SUBCUTANEOUS
  Administered 2014-07-14: 2 [IU] via SUBCUTANEOUS
  Administered 2014-07-15: 3 [IU] via SUBCUTANEOUS

## 2014-07-13 MED ORDER — SODIUM CHLORIDE 0.9 % IV SOLN
INTRAVENOUS | Status: AC
  Administered 2014-07-13: via INTRAVENOUS

## 2014-07-13 MED ORDER — ONDANSETRON HCL 4 MG PO TABS: 4.0000 mg | ORAL_TABLET | Freq: Four times a day (QID) | ORAL | Status: DC | PRN

## 2014-07-13 MED ORDER — MORPHINE SULFATE 2 MG/ML IJ SOLN: 1.0000 mg | INTRAMUSCULAR | Status: DC | PRN

## 2014-07-13 MED ORDER — DEXAMETHASONE SODIUM PHOSPHATE 10 MG/ML IJ SOLN
10.0000 mg | Freq: Once | INTRAMUSCULAR | Status: AC
  Administered 2014-07-13: 10 mg via INTRAVENOUS
  Filled 2014-07-13: qty 1

## 2014-07-13 MED ORDER — SODIUM CHLORIDE 0.9 % IJ SOLN
3.0000 mL | Freq: Two times a day (BID) | INTRAMUSCULAR | Status: DC
  Administered 2014-07-13 – 2014-07-14 (×3): 3 mL via INTRAVENOUS

## 2014-07-13 MED ORDER — SIMVASTATIN 20 MG PO TABS
20.0000 mg | ORAL_TABLET | Freq: Every day | ORAL | Status: DC
  Administered 2014-07-14: 20 mg via ORAL
  Filled 2014-07-13 (×2): qty 1

## 2014-07-13 MED ORDER — LORAZEPAM 2 MG/ML IJ SOLN: 0.5000 mg | Freq: Four times a day (QID) | INTRAMUSCULAR | Status: DC | PRN

## 2014-07-13 MED ORDER — DOCUSATE SODIUM 100 MG PO CAPS
100.0000 mg | ORAL_CAPSULE | Freq: Two times a day (BID) | ORAL | Status: DC
  Administered 2014-07-13 – 2014-07-15 (×4): 100 mg via ORAL
  Filled 2014-07-13 (×6): qty 1

## 2014-07-13 MED ORDER — NICOTINE 14 MG/24HR TD PT24
14.0000 mg | MEDICATED_PATCH | Freq: Every day | TRANSDERMAL | Status: DC
  Administered 2014-07-14 – 2014-07-15 (×2): 14 mg via TRANSDERMAL
  Filled 2014-07-13 (×2): qty 1

## 2014-07-13 MED ORDER — GABAPENTIN 400 MG PO CAPS
800.0000 mg | ORAL_CAPSULE | Freq: Three times a day (TID) | ORAL | Status: DC
  Administered 2014-07-13 – 2014-07-15 (×5): 800 mg via ORAL
  Filled 2014-07-13 (×8): qty 2

## 2014-07-13 MED ORDER — ACETAMINOPHEN 325 MG PO TABS: 650.0000 mg | ORAL_TABLET | Freq: Four times a day (QID) | ORAL | Status: DC | PRN

## 2014-07-13 MED ORDER — DEXAMETHASONE SODIUM PHOSPHATE 4 MG/ML IJ SOLN
4.0000 mg | Freq: Four times a day (QID) | INTRAMUSCULAR | Status: DC
  Administered 2014-07-13 – 2014-07-15 (×6): 4 mg via INTRAVENOUS
  Filled 2014-07-13 (×11): qty 1

## 2014-07-13 MED ORDER — THIAMINE HCL 100 MG/ML IJ SOLN
100.0000 mg | Freq: Every day | INTRAMUSCULAR | Status: DC
  Administered 2014-07-13 – 2014-07-14 (×2): 100 mg via INTRAVENOUS
  Filled 2014-07-13 (×2): qty 1

## 2014-07-13 MED ORDER — ONDANSETRON HCL 4 MG/2ML IJ SOLN: 4.0000 mg | Freq: Four times a day (QID) | INTRAMUSCULAR | Status: DC | PRN

## 2014-07-13 NOTE — Progress Notes (Signed)
Received a call from ER RN.  Dr. Sondra Come requesting Neuro surgery consult before transfer to [facility]. Called NS (Dr. Kathyrn Sheriff) who saw the patient and recommended moving forward with the transfer to [facility]. Per NS the patient will likely need radiation and then have surgery 2-3 days. Later.  Imogene Burn, PA-C [facility] Pager: [phone]

## 2014-07-13 NOTE — Progress Notes (Signed)
  Radiation Oncology         [phone] ________________________________  Name: Rodney Powers MRN: [id number]  Date: 07/13/2014  DOB: 1956/01/26  Chart Note:  I reviewed this patient's most recent findings and wanted to take a minute to document my impression.  He completed definitive chemoradiotherapy for stage IIIB non-small cell lung cancer 13 months ago and is now found to have 2 isolated brain metastases.  The largest is 2.7 cm and there is some midline shift.  With limited brain metastases, he may not need whole brain radiotherapy.  He may be a candidate for stereotactic radiosurgery to the brain and possibly surgery.  I would recommend admission and initiation of decadron with neurosurgery to review films regarding SRS with or without resection. ________________________________  Sheral Apley Tammi Klippel, M.D.

## 2014-07-13 NOTE — ED Notes (Signed)
Janeice Robinson, NP returned page.  Advised to call Cathlean Cower, NP.

## 2014-07-13 NOTE — ED Provider Notes (Signed)
CSN: [id number]     Arrival date & time 07/13/14  1355 History   First MD Initiated Contact with Patient 07/13/14 1402     Chief Complaint  Patient presents with  . Code Stroke     (Consider location/radiation/quality/duration/timing/severity/associated sxs/prior Treatment) HPI  2:02 PM Patient initially evaluated as a code stroke. Patient reports that he fell twice earlier today and thinks he may have "passed out." He also reports dizziness and lightheadedness. On EMS arrival, he was noted to have a left-sided deficits and a code stroke was initiated. ABCs intact for CT evaluation.  Patient with a history of diabetes, lung cancer status post chemoradiation, hypertension, COPD presents as above. Wife also states that she is noted he has had difficulty speaking since Monday. Patient appear swollen and can name and repeat. He is oriented. No significant weakness noted on exam. He does have mild flattening of the left nasal labial fold. Patient denies any chest pain or shortness of breath prior to passing out. He does report a headache.  Past Medical History  Diagnosis Date  . Back pain, chronic   . Mediastinal mass   . Urinary retention   . Lung cancer 04/07/2013  . Diabetic neuropathy   . Diabetes mellitus, type II     takes Gimepimide and Metformin daily  . Enlarged prostate     takes Flomax every evening  . Hypertension     slightly elevated recently;no meds;just watching for now  . COPD (chronic obstructive pulmonary disease)     emphysema  . Shortness of breath     with exertion  . Pneumonia     hx of;last time about 77yrs ago  . History of bronchitis     last time 70yrs ago  . Joint pain     stiffness and cramping in both hands  . Urinary frequency   . Nocturia   . Anemia     no meds and hasn't had a transfusion ever  . Anxiety   . Depression     no meds required;hospitatlized 8yrs ago for this  . Hx of radiation therapy 04/19/13- 06/07/13    left upper central chest, 63  gray 35 fx  . Heart murmur     as a baby  . Diarrhea   . Neuropathy   . Back pain 11/14/2013  . On home O2     2L N/C   Past Surgical History  Procedure Laterality Date  . Video bronchoscopy with endobronchial ultrasound Bilateral 03/10/2013    Procedure: VIDEO BRONCHOSCOPY WITH ENDOBRONCHIAL ULTRASOUND;  Surgeon: Rigoberto Noel, MD;  Location: [place];  Service: Pulmonary;  Laterality: Bilateral;  . Mediastinotomy chamberlain mcneil Left 03/24/2013    Procedure: MEDIASTINOTOMY CHAMBERLAIN MCNEIL;  Surgeon: Melrose Nakayama, MD;  Location: [place];  Service: Thoracic;  Laterality: Left;  . Excisional hemorrhoidectomy      > 30 yrs ago  . Colonoscopy    . Direct laryngoscopy N/A 07/01/2013    Procedure: DIRECT LARYNGOSCOPY;  Surgeon: Izora Gala, MD;  Location: [facility] OR;  Service: ENT;  Laterality: N/A;  . Esophagoscopy N/A 07/01/2013    Procedure: ESOPHAGOSCOPY WITH BIOPSY;  Surgeon: Izora Gala, MD;  Location: [facility] OR;  Service: ENT;  Laterality: N/A;  . Direct laryngoscopy N/A 08/01/2013    Procedure: DIRECT LARYNGOSCOPY WITH EXCISION OF PHARYNGEAL MASS;  Surgeon: Izora Gala, MD;  Location: [facility] OR;  Service: ENT;  Laterality: N/A;  . Tonsillectomy Right 08/01/2013    Procedure: TONSILLECTOMY;  Surgeon:  Izora Gala, MD;  Location: [facility] OR;  Service: ENT;  Laterality: Right;   Family History  Problem Relation Age of Onset  . Aneurysm Mother   . Heart attack Father   . Aneurysm Sister   . Cancer Brother    History  Substance Use Topics  . Smoking status: Current Every Day Smoker -- 0.50 packs/day for 41 years    Types: Cigarettes    Start date: 07/18/1971  . Smokeless tobacco: Never Used     Comment: nicotene patch  . Alcohol Use: 8.4 oz/week    14 Cans of beer per week     Comment: daily beer 2    Review of Systems  Constitutional: Negative for fever.  Respiratory: Negative.  Negative for chest tightness and shortness of breath.   Cardiovascular: Negative.  Negative for chest  pain.  Gastrointestinal: Negative.  Negative for abdominal pain.  Genitourinary: Negative.  Negative for dysuria.  Musculoskeletal: Negative for back pain.  Skin: Negative for wound.  Neurological: Positive for dizziness, syncope, speech difficulty, weakness, light-headedness and headaches.  Psychiatric/Behavioral: Negative for confusion.  All other systems reviewed and are negative.     Allergies  Review of patient's allergies indicates no known allergies.  Home Medications   Prior to Admission medications   Medication Sig Start Date End Date Taking? Authorizing Provider  gabapentin (NEURONTIN) 400 MG capsule Take 800 mg by mouth 3 (three) times daily.   Yes Historical Provider, MD  glimepiride (AMARYL) 4 MG tablet Take 1 tablet (4 mg total) by mouth daily before breakfast. 03/14/14  Yes Lorayne Marek, MD  HYDROcodone-acetaminophen (NORCO/VICODIN) 5-325 MG per tablet Take 1-2 tablets by mouth every 6 (six) hours as needed for moderate pain.   Yes Historical Provider, MD  hydrocortisone 2.5 % cream Apply topically 2 (two) times daily. 03/14/14  Yes Lorayne Marek, MD  ramipril (ALTACE) 2.5 MG capsule Take 1 capsule (2.5 mg total) by mouth daily. 03/14/14  Yes Lorayne Marek, MD  simvastatin (ZOCOR) 20 MG tablet Take 1 tablet (20 mg total) by mouth at bedtime. 12/30/13  Yes Reyne Dumas, MD  Lancets (ACCU-CHEK MULTICLIX) lancets Use as instructed 12/21/13   Heath Lark, MD   BP 126/74  Pulse 61  Temp(Src) 98.1 F (36.7 C) (Oral)  Resp 26  Ht 6\' 3"  (1.905 m)  Wt 239 lb (108.41 kg)  BMI 29.87 kg/m2  SpO2 90% Physical Exam  Nursing note and vitals reviewed. Constitutional: He is oriented to person, place, and time. He appears well-developed and well-nourished. No distress.  Overweight  HENT:  Head: Normocephalic and atraumatic.  Mouth/Throat: Oropharynx is clear and moist.  Eyes: EOM are normal. Pupils are equal, round, and reactive to light.  Neck: Neck supple.  Cardiovascular:  Normal rate, regular rhythm and normal heart sounds.   No murmur heard. Pulmonary/Chest: Effort normal. No respiratory distress. He has no wheezes.  Coarse breath sounds bilaterally  Abdominal: Soft. Bowel sounds are normal. There is no tenderness. There is no rebound.  Musculoskeletal: He exhibits no edema.  Lymphadenopathy:    He has no cervical adenopathy.  Neurological: He is alert and oriented to person, place, and time.  Mild flattening of the left nasolabial fold  Skin: Skin is warm and dry.  Psychiatric: He has a normal mood and affect.    ED Course  Procedures (including critical care time) Labs Review Labs Reviewed  ETHANOL - Abnormal; Notable for the following:    Alcohol, Ethyl (B) 41 (*)  All other components within normal limits  CBC - Abnormal; Notable for the following:    RDW 15.9 (*)    All other components within normal limits  COMPREHENSIVE METABOLIC PANEL - Abnormal; Notable for the following:    Glucose, Bld 103 (*)    GFR calc non Af Amer 71 (*)    GFR calc Af Amer 82 (*)    Anion gap 18 (*)    All other components within normal limits  I-STAT CHEM 8, ED - Abnormal; Notable for the following:    Glucose, Bld 106 (*)    All other components within normal limits  PROTIME-INR  APTT  DIFFERENTIAL  URINE RAPID DRUG SCREEN (HOSP PERFORMED)  URINALYSIS, ROUTINE W REFLEX MICROSCOPIC  I-STAT TROPOININ, ED  I-STAT TROPOININ, ED    Imaging Review Ct Head Wo Contrast  07/13/2014   CLINICAL DATA:  Code stroke.  Lung cancer.  EXAM: CT HEAD WITHOUT CONTRAST  TECHNIQUE: Contiguous axial images were obtained from the base of the skull through the vertex without intravenous contrast.  COMPARISON:  MRI 04/01/2013.  PET-CT 03/15/2013, CT 10/13/2011.  FINDINGS: Hyperdense approximately 3 cm possible mass in the right temporal lobe. Severe adjacent edema is noted. Midline shift of the third and right lateral ventricle to the left of approximately 7 mm. Ill-defined  possible 1.3 cm mass right temporal lobe tip. Questionable mass noted along the left portion of the basilar cisterns, image number 7/series 2. These findings together suggest possible metastatic disease. Subtle amount of petechial hemorrhage within the masses cannot be excluded. Associated stroke cannot be excluded. No hydrocephalus. No acute bony abnormality. Right frontotemporal subcutaneous lipoma.  IMPRESSION: Findings worrisome for metastatic disease in this patient with known lung cancer. Prominent edema in the right temporoparietal region results in mass effect with midline shift from right to left of 7 mm. A component of mild hemorrhage within masses cannot be excluded. Gadolinium-enhanced MRI of the brain should be considered for further evaluation. These results were called by telephone at the time of interpretation on 07/13/2014 at 2:30 pm to Dr. Doy Mince, who verbally acknowledged these results.   Electronically Signed   By: Marcello Moores  Register   On: 07/13/2014 14:34   Dg Chest Portable 1 View  07/13/2014   CLINICAL DATA:  Code stroke.  EXAM: PORTABLE CHEST - 1 VIEW  COMPARISON:  Chest radiograph June 22, 2014, CT of the chest February 22, 2014  FINDINGS: Similar cardiomegaly. Mediastinal silhouette is nonsuspicious. Strandy density in left midlung zone, improved from prior examination. Stable COPD. Left lung base atelectasis versus scarring. No new consolidation. No pleural effusions. No pneumothorax.  Soft tissue planes and included osseous structures are nonsuspicious.  IMPRESSION: Improved aeration of left midlung zone, stable COPD. Stable cardiomegaly, no acute pulmonary process.   Electronically Signed   By: Elon Alas   On: 07/13/2014 15:01     EKG Interpretation None      MDM   Final diagnoses:  Syncope, unspecified syncope type  Metastatic cancer    Patient presents with syncope versus stroke. Subtle findings on neurologic exam. He is awake, alert and oriented. CT scan of the  head is concerning for metastatic disease with shift. This is likely the cause of patient's symptoms today. Patient was updated at the bedside.  Patient was given IV Decadron and an x-ray of the chest was obtained. Discussed with oncology and the hospitalist for admission.    Merryl Hacker, MD 07/13/14 [phone]

## 2014-07-13 NOTE — H&P (Signed)
[facility] History and Physical  Rodney Powers SWH:[id number] DOB: Aug 21, 1956 DOA: 07/13/2014   PCP: Lorayne Marek, MD  Specialists: Dr. Rozetta Nunnery with oncology  Chief Complaint: Passed out  HPI: Rodney Powers is a 58 y.o. male with a past medical history of lung cancer, status post chemotherapy and radiation treatment culminating in August of 2014, COPD, chronic back pain, type 2 diabetes, hypertension, who was in his usual state of health till earlier today when he was walking from the kitchen to the living room to eat his lunch when he passed out. He might have had a second episode as well.  He was complaining of dizziness and lightheadedness earlier today. Patient appears to be mildly confused and hence is not a good historian. EMS was called after the syncopal episode and at that point he started complaining of weakness and pain in the left arm and leg. There are also reports of headaches. Patient denies any chest pain, shortness of breath. Denies being on home oxygen. He actually told me that he did not feel weak on any one side. Denies any headaches currently. No recent illness. Denies any blurred vision. No seizure type activity. No urinary incontinence.  Home Medications: Prior to Admission medications   Medication Sig Start Date End Date Taking? Authorizing Provider  gabapentin (NEURONTIN) 400 MG capsule Take 800 mg by mouth 3 (three) times daily.   Yes Historical Provider, MD  glimepiride (AMARYL) 4 MG tablet Take 1 tablet (4 mg total) by mouth daily before breakfast. 03/14/14  Yes Lorayne Marek, MD  HYDROcodone-acetaminophen (NORCO/VICODIN) 5-325 MG per tablet Take 1-2 tablets by mouth every 6 (six) hours as needed for moderate pain.   Yes Historical Provider, MD  hydrocortisone 2.5 % cream Apply topically 2 (two) times daily. 03/14/14  Yes Lorayne Marek, MD  ramipril (ALTACE) 2.5 MG capsule Take 1 capsule (2.5 mg total) by mouth daily. 03/14/14  Yes Lorayne Marek, MD    simvastatin (ZOCOR) 20 MG tablet Take 1 tablet (20 mg total) by mouth at bedtime. 12/30/13  Yes Reyne Dumas, MD  Lancets (ACCU-CHEK MULTICLIX) lancets Use as instructed 12/21/13   Heath Lark, MD    Allergies: No Known Allergies  Past Medical History: Past Medical History  Diagnosis Date  . Back pain, chronic   . Mediastinal mass   . Urinary retention   . Lung cancer 04/07/2013  . Diabetic neuropathy   . Diabetes mellitus, type II     takes Gimepimide and Metformin daily  . Enlarged prostate     takes Flomax every evening  . Hypertension     slightly elevated recently;no meds;just watching for now  . COPD (chronic obstructive pulmonary disease)     emphysema  . Shortness of breath     with exertion  . Pneumonia     hx of;last time about 34yr ago  . History of bronchitis     last time [id number]  . Joint pain     stiffness and cramping in both hands  . Urinary frequency   . Nocturia   . Anemia     no meds and hasn't had a transfusion ever  . Anxiety   . Depression     no meds required;hospitatlized [id number] for this  . Hx of radiation therapy 04/19/13- 06/07/13    left upper central chest, 63 gray 35 fx  . Heart murmur     as a baby  . Diarrhea   . Neuropathy   . Back  pain 11/14/2013  . On home O2     2L N/C    Past Surgical History  Procedure Laterality Date  . Video bronchoscopy with endobronchial ultrasound Bilateral 03/10/2013    Procedure: VIDEO BRONCHOSCOPY WITH ENDOBRONCHIAL ULTRASOUND;  Surgeon: Rigoberto Noel, MD;  Location: [place];  Service: Pulmonary;  Laterality: Bilateral;  . Mediastinotomy chamberlain mcneil Left 03/24/2013    Procedure: MEDIASTINOTOMY CHAMBERLAIN MCNEIL;  Surgeon: Melrose Nakayama, MD;  Location: [place];  Service: Thoracic;  Laterality: Left;  . Excisional hemorrhoidectomy      > 30 yrs ago  . Colonoscopy    . Direct laryngoscopy N/A 07/01/2013    Procedure: DIRECT LARYNGOSCOPY;  Surgeon: Izora Gala, MD;  Location: [place];  Service:  ENT;  Laterality: N/A;  . Esophagoscopy N/A 07/01/2013    Procedure: ESOPHAGOSCOPY WITH BIOPSY;  Surgeon: Izora Gala, MD;  Location: [place];  Service: ENT;  Laterality: N/A;  . Direct laryngoscopy N/A 08/01/2013    Procedure: DIRECT LARYNGOSCOPY WITH EXCISION OF PHARYNGEAL MASS;  Surgeon: Izora Gala, MD;  Location: [place];  Service: ENT;  Laterality: N/A;  . Tonsillectomy Right 08/01/2013    Procedure: TONSILLECTOMY;  Surgeon: Izora Gala, MD;  Location[facility];  Service: ENT;  Laterality: Right;    Social History: He lives in [place] with a friend, and his daughter. Smokes 2 packs of cigarettes on a daily basis. Drinks 2 beers on a daily basis. Denies liquor intake. Denies any illicit drug use. Usually independent with daily activities.  Family History:  Family History  Problem Relation Age of Onset  . Aneurysm Mother   . Heart attack Father   . Aneurysm Sister   . Cancer Brother      Review of Systems - unable to do due to his confusion  Physical Examination  Filed Vitals:   07/13/14 1446 07/13/14 1500 07/13/14 1515 07/13/14 1545  BP:  141/80 142/80 140/80  Pulse:  66 69 69  Temp: 98.1 F (36.7 C)     TempSrc:      Resp:  _0 Height:      Weight:      SpO2:  95% 90% 97%    BP 140/80  Pulse 69  Temp(Src) 98.1 F (36.7 C) (Oral)  Resp 26  Ht _1  (1.905 m)  Wt 108.41 kg (239 lb)  BMI 29.87 kg/m2  SpO2 97%  General appearance: alert, appears stated age, distracted and no distress Head: Normocephalic, without obvious abnormality, atraumatic Eyes: conjunctivae/corneas clear. PERRL, EOM's intact.  Throat: lips, mucosa, and tongue normal; teeth and gums normal Neck: no adenopathy, no carotid bruit, no JVD, supple, symmetrical, trachea midline and thyroid not enlarged, symmetric, no tenderness/mass/nodules Resp: clear to auscultation bilaterally Cardio: regular rate and rhythm, S1, S2 normal, no murmur, click, rub or gallop GI: soft, mild tenderness in right  upper abdomen without rebound rigidity or guarding. No murphy's sign; bowel sounds normal; no masses,  no organomegaly Extremities: extremities normal, atraumatic, no cyanosis or edema Pulses: 2+ and symmetric Skin: Skin color, texture, turgor normal. No rashes or lesions Neurologic: He is alert. No facial droop. Tongue is midline. Left-sided pronator drift is identified. Strength in left upper extremity is 3-4/5. Strength in the left lower extremity is also 3-4/5. Gait not assessed. Reflexes are equal.  Laboratory Data: Results for orders placed during the hospital encounter of 07/13/14 (from the past 48 hour(s))  ETHANOL     Status: Abnormal   Collection Time  07/13/14  2:00 PM      Result Value Ref Range   Alcohol, Ethyl (B) 41 (*) 0 - 11 mg/dL   Comment:            LOWEST DETECTABLE LIMIT FOR     SERUM ALCOHOL IS 11 mg/dL     FOR MEDICAL PURPOSES ONLY  PROTIME-INR     Status: None   Collection Time    07/13/14  2:00 PM      Result Value Ref Range   Prothrombin Time 13.6  11.6 - 15.2 seconds   INR 1.04  0.00 - 1.49  APTT     Status: None   Collection Time    07/13/14  2:00 PM      Result Value Ref Range   aPTT 28  24 - 37 seconds  CBC     Status: Abnormal   Collection Time    07/13/14  2:00 PM      Result Value Ref Range   WBC 4.2  4.0 - 10.5 K/uL   RBC 5.30  4.22 - 5.81 MIL/uL   Hemoglobin 14.6  13.0 - 17.0 g/dL   HCT 43.9  39.0 - 52.0 %   MCV 82.8  78.0 - 100.0 fL   MCH 27.5  26.0 - 34.0 pg   MCHC 33.3  30.0 - 36.0 g/dL   RDW 15.9 (*) 11.5 - 15.5 %   Platelets 171  150 - 400 K/uL  DIFFERENTIAL     Status: None   Collection Time    07/13/14  2:00 PM      Result Value Ref Range   Neutrophils Relative % 67  43 - 77 %   Neutro Abs 2.9  1.7 - 7.7 K/uL   Lymphocytes Relative 23  12 - 46 %   Lymphs Abs 1.0  0.7 - 4.0 K/uL   Monocytes Relative 7  3 - 12 %   Monocytes Absolute 0.3  0.1 - 1.0 K/uL   Eosinophils Relative 2  0 - 5 %   Eosinophils Absolute 0.1  0.0 - 0.7  K/uL   Basophils Relative 1  0 - 1 %   Basophils Absolute 0.0  0.0 - 0.1 K/uL  COMPREHENSIVE METABOLIC PANEL     Status: Abnormal   Collection Time    07/13/14  2:00 PM      Result Value Ref Range   Sodium 141  137 - 147 mEq/L   Potassium 3.8  3.7 - 5.3 mEq/L   Chloride 104  96 - 112 mEq/L   CO2 19  19 - 32 mEq/L   Glucose, Bld 103 (*) 70 - 99 mg/dL   BUN 8  6 - 23 mg/dL   Creatinine, Ser 1.13  0.50 - 1.35 mg/dL   Calcium 9.1  8.4 - 10.5 mg/dL   Total Protein 6.9  6.0 - 8.3 g/dL   Albumin 3.5  3.5 - 5.2 g/dL   AST 17  0 - 37 U/L   ALT 16  0 - 53 U/L   Alkaline Phosphatase 77  39 - 117 U/L   Total Bilirubin 0.3  0.3 - 1.2 mg/dL   GFR calc non Af Amer 71 (*) >90 mL/min   GFR calc Af Amer 82 (*) >90 mL/min   Comment: (NOTE)     The eGFR has been calculated using the CKD EPI equation.     This calculation has not been validated in all clinical situations.     eGFR's  persistently <90 mL/min signify possible Chronic Kidney     Disease.   Anion gap 18 (*) 5 - 15  I-STAT TROPOININ, ED     Status: None   Collection Time    07/13/14  2:05 PM      Result Value Ref Range   Troponin i, poc 0.00  0.00 - 0.08 ng/mL   Comment 3            Comment: Due to the release kinetics of cTnI,     a negative result within the first hours     of the onset of symptoms does not rule out     myocardial infarction with certainty.     If myocardial infarction is still suspected,     repeat the test at appropriate intervals.  I-STAT CHEM 8, ED     Status: Abnormal   Collection Time    07/13/14  2:07 PM      Result Value Ref Range   Sodium 144  137 - 147 mEq/L   Potassium 3.7  3.7 - 5.3 mEq/L   Chloride 105  96 - 112 mEq/L   BUN 7  6 - 23 mg/dL   Creatinine, Ser 1.20  0.50 - 1.35 mg/dL   Glucose, Bld 106 (*) 70 - 99 mg/dL   Calcium, Ion 1.19  1.12 - 1.23 mmol/L   TCO2 20  0 - 100 mmol/L   Hemoglobin 16.7  13.0 - 17.0 g/dL   HCT 49.0  39.0 - 52.0 %    Radiology Reports: Ct Head Wo  Contrast  07/13/2014   CLINICAL DATA:  Code stroke.  Lung cancer.  EXAM: CT HEAD WITHOUT CONTRAST  TECHNIQUE: Contiguous axial images were obtained from the base of the skull through the vertex without intravenous contrast.  COMPARISON:  MRI 04/01/2013.  PET-CT 03/15/2013, CT 10/13/2011.  FINDINGS: Hyperdense approximately 3 cm possible mass in the right temporal lobe. Severe adjacent edema is noted. Midline shift of the third and right lateral ventricle to the left of approximately 7 mm. Ill-defined possible 1.3 cm mass right temporal lobe tip. Questionable mass noted along the left portion of the basilar cisterns, image number 7/series 2. These findings together suggest possible metastatic disease. Subtle amount of petechial hemorrhage within the masses cannot be excluded. Associated stroke cannot be excluded. No hydrocephalus. No acute bony abnormality. Right frontotemporal subcutaneous lipoma.  IMPRESSION: Findings worrisome for metastatic disease in this patient with known lung cancer. Prominent edema in the right temporoparietal region results in mass effect with midline shift from right to left of 7 mm. A component of mild hemorrhage within masses cannot be excluded. Gadolinium-enhanced MRI of the brain should be considered for further evaluation. These results were called by telephone at the time of interpretation on 07/13/2014 at 2:30 pm to Dr. Doy Mince, who verbally acknowledged these results.   Electronically Signed   By: Marcello Moores  Register   On: 07/13/2014 14:34   Dg Chest Portable 1 View  07/13/2014   CLINICAL DATA:  Code stroke.  EXAM: PORTABLE CHEST - 1 VIEW  COMPARISON:  Chest radiograph June 22, 2014, CT of the chest February 22, 2014  FINDINGS: Similar cardiomegaly. Mediastinal silhouette is nonsuspicious. Strandy density in left midlung zone, improved from prior examination. Stable COPD. Left lung base atelectasis versus scarring. No new consolidation. No pleural effusions. No pneumothorax.  Soft  tissue planes and included osseous structures are nonsuspicious.  IMPRESSION: Improved aeration of left midlung zone, stable COPD. Stable cardiomegaly, no  acute pulmonary process.   Electronically Signed   By: Elon Alas   On: 07/13/2014 15:01    Problem List  Principal Problem:   Brain metastases Active Problems:   ETOH abuse   Lung cancer   Type II or unspecified type diabetes mellitus without mention of complication, not stated as uncontrolled   Tobacco abuse   Assessment: This is a 58 year old, African American male, who presents after syncopal episodes. He is noted to have left-sided weakness. CT head is concerning for multiple brain metastases. This is most likely from his lung cancer, primary. This would explain his syncopal episodes.  Plan: #1 Syncope and left-sided weakness secondary to new brain metastases, likely from lung cancer, primary: Discussed with his oncologist. She recommends Decadron and transfer to [facility] long for consultation with radiation oncology. Patient might need radiation while he is hospitalized. At this time there is no need for seizure prophylaxis. MRI Brain has been ordered and is pending.  #2 history of lung cancer: He has finished chemotherapy and radiation treatment as of August of 2014. His oncologist, to address this further.  #3 diabetes mellitus, type II: Check HbA1c in the morning. Place him on sliding scale coverage. Hold his oral agents for now.  #4 history of Tobacco abuse: Nicotine patch will be prescribed.  #5 history of alcohol abuse: But not excessive intake. Given thiamine. Monitor him closely for signs and symptoms of withdrawal.   #6 history of COPD: Nebulizer treatments as needed. Chest x-ray does not show any new findings.  #7 nonspecific abdominal pain: He has had this for a few weeks. He actually had a visit to the emergency department for similar complaints earlier this month. He underwent an abdominal CT which showed  predominantly chronic findings. Cholelithiasis, however, was also noted. Symptoms are mainly on the right side. We will get an ultrasound of his abdomen in the morning. LFTs are noted to be normal. Hepatic lesions are noted to be present since 2014.   DVT Prophylaxis: SCDs Code Status: Full code Family Communication: Discussed with the patient. No family at bedside  Disposition Plan: Transfer to [facility]   Further management decisions will depend on results of further testing and patient's response to treatment.   [facility]  [facility] Pager [phone]  If 7PM-7AM, please contact night-coverage [email or web address] Password [facility]  07/13/2014, 4:13 PM  Disclaimer: This note was dictated with voice recognition software. Similar sounding words can inadvertently be transcribed and may not be corrected upon review.

## 2014-07-13 NOTE — Code Documentation (Signed)
58yo male arriving to [facility] at 54 via [place].  EMS reports that the patient was at home at his baseline when at 1230 he experienced a syncopal episode and fell.  He had dizziness and felt lightheaded.  He walked into the kitchen and fell again into his food. EMS called and assessed left sided weakness.  Code stroke activated.  Patient with a history of DM, HTN, and lung cancer.  Patient cleared at the bridge and labs drawn.  Patient taken to CT.  NIHSS 2, see documentation for details and times.  Patient with left arm and leg weakness.  See CT results.  Dr. Doy Mince at bedside.  No acute stroke treatment at this time per MD.  Code stroke canceled at 1426.  Bedside handoff with ED RN Jarrett Soho.

## 2014-07-13 NOTE — ED Notes (Signed)
Clarifying if patient is transferring to [facility] or getting admitted to [facility].

## 2014-07-13 NOTE — Consult Note (Signed)
Referring Physician: Doy Mince    Chief Complaint: Code stroke  HPI:                                                                                                                                         Rodney Powers is an 58 y.o. 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 Patient was doing well today until about 93 Noon.  He fixed lunch and was walking into the living room when he had a syncopal episode.  He finished his lunch and while bringing his dishes to the kitchen had a second syncopal episode. This time his live in friend heard him fall. She came to him and fund him on his knees but alert and oriented. Her PT did a exam on him and recommended they call EMS.  On EMS arrival it was noted he had a slight left facial droop and left sided weakness. He was brought to ED as a code stroke.  Date last known well: Date: 07/13/2014 Time last known well: Time: 12:00 tPA Given: No: NIHSS 2 and minimal symptoms  Past Medical History  Diagnosis Date  . Back pain, chronic   . Mediastinal mass   . Urinary retention   . Lung cancer 04/07/2013  . Diabetic neuropathy   . Diabetes mellitus, type II     takes Gimepimide and Metformin daily  . Enlarged prostate     takes Flomax every evening  . Hypertension     slightly elevated recently;no meds;just watching for now  . COPD (chronic obstructive pulmonary disease)     emphysema  . Shortness of breath     with exertion  . Pneumonia     hx of;last time about 3yrs ago  . History of bronchitis     last time 69yrs ago  . Joint pain     stiffness and cramping in both hands  . Urinary frequency   . Nocturia   . Anemia     no meds and hasn't had a transfusion ever  . Anxiety   . Depression     no meds required;hospitatlized 67yrs ago for this  . Hx of radiation therapy 04/19/13- 06/07/13    left upper central chest, 63 gray 35 fx  . Heart murmur     as a baby  . Diarrhea   . Neuropathy    . Back pain 11/14/2013  . On home O2     2L N/C    Past Surgical History  Procedure Laterality Date  . Video bronchoscopy with endobronchial ultrasound Bilateral 03/10/2013    Procedure: VIDEO BRONCHOSCOPY WITH ENDOBRONCHIAL ULTRASOUND;  Surgeon: Rigoberto Noel, MD;  Location: [place];  Service: Pulmonary;  Laterality: Bilateral;  . Mediastinotomy chamberlain mcneil Left 03/24/2013    Procedure: MEDIASTINOTOMY CHAMBERLAIN MCNEIL;  Surgeon: Melrose Nakayama, MD;  Location: [place];  Service: Thoracic;  Laterality: Left;  . Excisional hemorrhoidectomy      > 30 yrs ago  . Colonoscopy    . Direct laryngoscopy N/A 07/01/2013    Procedure: DIRECT LARYNGOSCOPY;  Surgeon: Izora Gala, MD;  Location: [place];  Service: ENT;  Laterality: N/A;  . Esophagoscopy N/A 07/01/2013    Procedure: ESOPHAGOSCOPY WITH BIOPSY;  Surgeon: Izora Gala, MD;  Location: [place];  Service: ENT;  Laterality: N/A;  . Direct laryngoscopy N/A 08/01/2013    Procedure: DIRECT LARYNGOSCOPY WITH EXCISION OF PHARYNGEAL MASS;  Surgeon: Izora Gala, MD;  Location: [place];  Service: ENT;  Laterality: N/A;  . Tonsillectomy Right 08/01/2013    Procedure: TONSILLECTOMY;  Surgeon: Izora Gala, MD;  Location: [facility];  Service: ENT;  Laterality: Right;    Family History  Problem Relation Age of Onset  . Aneurysm Mother   . Heart attack Father   . Aneurysm Sister   . Cancer Brother    Social History:  reports that he has been smoking Cigarettes.  He started smoking about 43 years ago. He has a 20.5 pack-year smoking history. He has never used smokeless tobacco. He reports that he drinks about 8.4 ounces of alcohol per week. He reports that he does not use illicit drugs.  Allergies: No Known Allergies  Medications:                                                                                                                           No current facility-administered medications for this encounter.   Current Outpatient  Prescriptions  Medication Sig Dispense Refill  . gabapentin (NEURONTIN) 400 MG capsule Take 800 mg by mouth 3 (three) times daily.      Marland Kitchen glimepiride (AMARYL) 4 MG tablet Take 1 tablet (4 mg total) by mouth daily before breakfast.  30 tablet  3  . HYDROcodone-acetaminophen (NORCO/VICODIN) 5-325 MG per tablet Take 1-2 tablets by mouth every 6 (six) hours as needed for moderate pain.      . hydrocortisone 2.5 % cream Apply topically 2 (two) times daily.  30 g  0  . ramipril (ALTACE) 2.5 MG capsule Take 1 capsule (2.5 mg total) by mouth daily.  90 capsule  3  . simvastatin (ZOCOR) 20 MG tablet Take 1 tablet (20 mg total) by mouth at bedtime.  90 tablet  3  . Lancets (ACCU-CHEK MULTICLIX) lancets Use as instructed  100 each  0     ROS:  History obtained from the patient  General ROS: negative for - chills, fatigue, fever, night sweats, weight gain or weight loss Psychological ROS: negative for - behavioral disorder, hallucinations, memory difficulties, mood swings or suicidal ideation Ophthalmic ROS: negative for - blurry vision, double vision, eye pain or loss of vision ENT ROS: negative for - epistaxis, nasal discharge, oral lesions, sore throat, tinnitus or vertigo Allergy and Immunology ROS: negative for - hives or itchy/watery eyes Hematological and Lymphatic ROS: negative for - bleeding problems, bruising or swollen lymph nodes Endocrine ROS: negative for - galactorrhea, hair pattern changes, polydipsia/polyuria or temperature intolerance Respiratory ROS: negative for - cough, hemoptysis, shortness of breath or wheezing Cardiovascular ROS: negative for - chest pain, dyspnea on exertion, edema or irregular heartbeat Gastrointestinal ROS: negative for - abdominal pain, diarrhea, hematemesis, nausea/vomiting or stool incontinence Genito-Urinary ROS: negative for  - dysuria, hematuria, incontinence or urinary frequency/urgency Musculoskeletal ROS: negative for - joint swelling or muscular weakness Neurological ROS: as noted in HPI Dermatological ROS: negative for rash and skin lesion changes  Neurologic Examination:                                                                                                      Blood pressure 140/80, pulse 69, temperature 98.1 F (36.7 C), temperature source Oral, resp. rate 26, height 6\' 3"  (1.905 m), weight 108.41 kg (239 lb), SpO2 97.00%.   General: NAD Mental Status: Alert, oriented, thought content appropriate.  Speech slightly dysarthric without evidence of aphasia.  Able to follow 3 step commands without difficulty. Cranial Nerves: II: Discs flat bilaterally; Visual fields grossly normal, pupils equal, round, reactive to light and accommodation III,IV, VI: ptosis not present, extra-ocular motions intact bilaterally V,VII: smile asymmetric on the left, facial light touch sensation normal bilaterally VIII: hearing normal bilaterally IX,X: gag reflex present XI: bilateral shoulder shrug XII: midline tongue extension without atrophy or fasciculations  Motor: Right : Upper extremity   5/5    Left:     Upper extremity   4/5  Lower extremity   5/5     Lower extremity   4/5 Tone and bulk:normal tone throughout; no atrophy noted Sensory: Pinprick and light touch intact throughout, bilaterally Deep Tendon Reflexes:  Right: Upper Extremity   Left: Upper extremity   biceps (C-5 to C-6) 2/4   biceps (C-5 to C-6) 2/4 tricep (C7) 2/4    triceps (C7) 2/4 Brachioradialis (C6) 2/4  Brachioradialis (C6) 2/4  Lower Extremity Lower Extremity  quadriceps (L-2 to L-4) 2/4   quadriceps (L-2 to L-4) 2/4 Achilles (S1) 0/4   Achilles (S1) 0/4  Plantars: Right: downgoing   Left: downgoing Cerebellar: normal finger-to-nose,  normal heel-to-shin test Gait: not tested CV: pulses palpable throughout    Lab  Results: Basic Metabolic Panel:  Recent Labs Lab 07/13/14 1400 07/13/14 1407  NA 141 144  K 3.8 3.7  CL 104 105  CO2 19  --   GLUCOSE 103* 106*  BUN 8 7  CREATININE 1.13 1.20  CALCIUM 9.1  --     Liver Function  Tests:  Recent Labs Lab 07/13/14 1400  AST 17  ALT 16  ALKPHOS 77  BILITOT 0.3  PROT 6.9  ALBUMIN 3.5   No results found for this basename: LIPASE, AMYLASE,  in the last 168 hours No results found for this basename: AMMONIA,  in the last 168 hours  CBC:  Recent Labs Lab 07/13/14 1400 07/13/14 1407  WBC 4.2  --   NEUTROABS 2.9  --   HGB 14.6 16.7  HCT 43.9 49.0  MCV 82.8  --   PLT 171  --     Cardiac Enzymes: No results found for this basename: CKTOTAL, CKMB, CKMBINDEX, TROPONINI,  in the last 168 hours  Lipid Panel: No results found for this basename: CHOL, TRIG, HDL, CHOLHDL, VLDL, LDLCALC,  in the last 168 hours  CBG: No results found for this basename: GLUCAP,  in the last 168 hours  Microbiology: Results for orders placed during the hospital encounter of 06/22/14  URINE CULTURE     Status: None   Collection Time    06/22/14  9:39 AM      Result Value Ref Range Status   Specimen Description URINE, RANDOM   Final   Special Requests NONE   Final   Culture  Setup Time     Final   Value: 06/22/2014 14:19     Performed at [facility]     Final   Value: NO GROWTH     Performed at [facility]   Culture     Final   Value: NO GROWTH     Performed at [facility]   Report Status 06/23/2014 FINAL   Final    Coagulation Studies:  Recent Labs  07/13/14 1400  LABPROT 13.6  INR 1.04    Imaging: Ct Head Wo Contrast  07/13/2014   CLINICAL DATA:  Code stroke.  Lung cancer.  EXAM: CT HEAD WITHOUT CONTRAST  TECHNIQUE: Contiguous axial images were obtained from the base of the skull through the vertex without intravenous contrast.  COMPARISON:  MRI 04/01/2013.  PET-CT 03/15/2013, CT 10/13/2011.   FINDINGS: Hyperdense approximately 3 cm possible mass in the right temporal lobe. Severe adjacent edema is noted. Midline shift of the third and right lateral ventricle to the left of approximately 7 mm. Ill-defined possible 1.3 cm mass right temporal lobe tip. Questionable mass noted along the left portion of the basilar cisterns, image number 7/series 2. These findings together suggest possible metastatic disease. Subtle amount of petechial hemorrhage within the masses cannot be excluded. Associated stroke cannot be excluded. No hydrocephalus. No acute bony abnormality. Right frontotemporal subcutaneous lipoma.  IMPRESSION: Findings worrisome for metastatic disease in this patient with known lung cancer. Prominent edema in the right temporoparietal region results in mass effect with midline shift from right to left of 7 mm. A component of mild hemorrhage within masses cannot be excluded. Gadolinium-enhanced MRI of the brain should be considered for further evaluation. These results were called by telephone at the time of interpretation on 07/13/2014 at 2:30 pm to Dr. Doy Mince, who verbally acknowledged these results.   Electronically Signed   By: Marcello Moores  Register   On: 07/13/2014 14:34   Mr Jeri Cos Wo Contrast  07/13/2014   CLINICAL DATA:  Lung cancer. Syncope. Evaluate for metastatic disease.  EXAM: MRI HEAD WITHOUT AND WITH CONTRAST  TECHNIQUE: Multiplanar, multiecho pulse sequences of the brain and surrounding structures were obtained without and with intravenous contrast.  CONTRAST:  69mL MULTIHANCE GADOBENATE DIMEGLUMINE 529 MG/ML IV SOLN  COMPARISON:  Head CT 07/13/2014 and MRI 04/01/2013  FINDINGS: Right frontotemporal scalp lipoma is again seen. Sebaceous cyst in the scalp at the skull vertex is unchanged. Mild heterogeneity of the bone marrow signal at the skullbase and upper cervical spine is again noted and similar to prior.  There is no acute infarct or extra-axial fluid collection. New enhancing  lesions measure 1.7 x 1.4 cm in the anterior right temporal lobe (series 12, image 18), and 2.7 x 1.8 cm in the posterior right temporal lobe (series 12, image 27). There is extensive surrounding vasogenic edema throughout the right temporal lobe extending into the parietal and posterior frontal lobes. There is mass effect on the right lateral ventricle with 10 mm of leftward midline shift. There is mild cerebral atrophy.  Orbits are unremarkable. Mild bilateral ethmoid air cell mucosal thickening and small left maxillary sinus mucous retention cysts are noted. Mastoid air cells are clear. Major intracranial vascular flow voids are preserved.  IMPRESSION: Two new enhancing right temporal lobe lesions, consistent with metastases. Extensive vasogenic edema results an 10 mm midline shift.   Electronically Signed   By: Logan Bores   On: 07/13/2014 17:38   Dg Chest Portable 1 View  07/13/2014   CLINICAL DATA:  Code stroke.  EXAM: PORTABLE CHEST - 1 VIEW  COMPARISON:  Chest radiograph June 22, 2014, CT of the chest February 22, 2014  FINDINGS: Similar cardiomegaly. Mediastinal silhouette is nonsuspicious. Strandy density in left midlung zone, improved from prior examination. Stable COPD. Left lung base atelectasis versus scarring. No new consolidation. No pleural effusions. No pneumothorax.  Soft tissue planes and included osseous structures are nonsuspicious.  IMPRESSION: Improved aeration of left midlung zone, stable COPD. Stable cardiomegaly, no acute pulmonary process.   Electronically Signed   By: Elon Alas   On: 07/13/2014 15:01     Rodney Quill PA-C [facility] [phone]  07/13/2014, 5:46 PM  Patient seen and examined.  Clinical course and management discussed.  Necessary edits performed.  I agree with the above.  Assessment and plan of care developed and discussed below.     Assessment: 58 y.o. 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 Head CT reviewed and shows evidence of what appears to be right hemispheric vasogenic edema and 76mm of right to left midline shift.  Acute infarct unlikely.  Code stroke cancelled.     Stroke Risk Factors - diabetes mellitus and hypertension   Recommend: 1) MRI brain with and without contrast.  2) EEG   Alexis Goodell, MD [facility] [phone]  07/13/2014  5:49 PM

## 2014-07-13 NOTE — ED Notes (Signed)
London Sheer, NP paged.

## 2014-07-13 NOTE — ED Notes (Signed)
Patient states the last time he ate was noon today.

## 2014-07-13 NOTE — ED Notes (Signed)
Clarified with Eda Keys, NP, that patient is to now transfer to [facility], and Dr. Shanon Brow will cancel admission order to [facility].  House flow is aware.

## 2014-07-13 NOTE — ED Notes (Signed)
MD Reynolds at the bedside.

## 2014-07-13 NOTE — ED Notes (Signed)
Belongings sent with security to be transported to [facility] [place] room 1434.

## 2014-07-13 NOTE — ED Notes (Signed)
Transferred to C25

## 2014-07-13 NOTE — Consult Note (Signed)
CC:  Chief Complaint  Patient presents with  . Code Stroke    HPI: Rodney Powers is a 58 y.o. male seen in the ED after being brought to the hospital after passing out. He says he was at home in the kitchen when he suddenly felt faint and passed out. He was told he was only out for a few seconds. Shortly thereafter he had another similar episode and was brought to the ED. After these episodes he says he had some weakness on the left arm and leg. He did not have any incontinence.   Currently, he feels back to normal denying any HA, visual changes, N/T/W.   He has a history of non-small cell lung CA diagnosed last year and underwent chemo and radiation ending August last year.   PMH: Past Medical History  Diagnosis Date  . Back pain, chronic   . Mediastinal mass   . Urinary retention   . Lung cancer 04/07/2013  . Diabetic neuropathy   . Diabetes mellitus, type II     takes Gimepimide and Metformin daily  . Enlarged prostate     takes Flomax every evening  . Hypertension     slightly elevated recently;no meds;just watching for now  . COPD (chronic obstructive pulmonary disease)     emphysema  . Shortness of breath     with exertion  . Pneumonia     hx of;last time about 5yrs ago  . History of bronchitis     last time 2yrs ago  . Joint pain     stiffness and cramping in both hands  . Urinary frequency   . Nocturia   . Anemia     no meds and hasn't had a transfusion ever  . Anxiety   . Depression     no meds required;hospitatlized 62yrs ago for this  . Hx of radiation therapy 04/19/13- 06/07/13    left upper central chest, 63 gray 35 fx  . Heart murmur     as a baby  . Diarrhea   . Neuropathy   . Back pain 11/14/2013  . On home O2     2L N/C    PSH: Past Surgical History  Procedure Laterality Date  . Video bronchoscopy with endobronchial ultrasound Bilateral 03/10/2013    Procedure: VIDEO BRONCHOSCOPY WITH ENDOBRONCHIAL ULTRASOUND;  Surgeon: Rigoberto Noel, MD;   Location: [place];  Service: Pulmonary;  Laterality: Bilateral;  . Mediastinotomy chamberlain mcneil Left 03/24/2013    Procedure: MEDIASTINOTOMY CHAMBERLAIN MCNEIL;  Surgeon: Melrose Nakayama, MD;  Location: [place];  Service: Thoracic;  Laterality: Left;  . Excisional hemorrhoidectomy      > 30 yrs ago  . Colonoscopy    . Direct laryngoscopy N/A 07/01/2013    Procedure: DIRECT LARYNGOSCOPY;  Surgeon: Izora Gala, MD;  Location: [place];  Service: ENT;  Laterality: N/A;  . Esophagoscopy N/A 07/01/2013    Procedure: ESOPHAGOSCOPY WITH BIOPSY;  Surgeon: Izora Gala, MD;  Location: [place];  Service: ENT;  Laterality: N/A;  . Direct laryngoscopy N/A 08/01/2013    Procedure: DIRECT LARYNGOSCOPY WITH EXCISION OF PHARYNGEAL MASS;  Surgeon: Izora Gala, MD;  Location: [place];  Service: ENT;  Laterality: N/A;  . Tonsillectomy Right 08/01/2013    Procedure: TONSILLECTOMY;  Surgeon: Izora Gala, MD;  Location: [place];  Service: ENT;  Laterality: Right;    SH: History  Substance Use Topics  . Smoking status: Current Every Day Smoker -- 0.50 packs/day for 41 years  Types: Cigarettes    Start date: 07/18/1971  . Smokeless tobacco: Never Used     Comment: nicotene patch  . Alcohol Use: 8.4 oz/week    14 Cans of beer per week     Comment: daily beer 2    MEDS: Prior to Admission medications   Medication Sig Start Date End Date Taking? Authorizing Provider  gabapentin (NEURONTIN) 400 MG capsule Take 800 mg by mouth 3 (three) times daily.   Yes Historical Provider, MD  glimepiride (AMARYL) 4 MG tablet Take 1 tablet (4 mg total) by mouth daily before breakfast. 03/14/14  Yes Lorayne Marek, MD  HYDROcodone-acetaminophen (NORCO/VICODIN) 5-325 MG per tablet Take 1-2 tablets by mouth every 6 (six) hours as needed for moderate pain.   Yes Historical Provider, MD  hydrocortisone 2.5 % cream Apply topically 2 (two) times daily. 03/14/14  Yes Lorayne Marek, MD  ramipril (ALTACE) 2.5 MG capsule Take 1 capsule  (2.5 mg total) by mouth daily. 03/14/14  Yes Lorayne Marek, MD  simvastatin (ZOCOR) 20 MG tablet Take 1 tablet (20 mg total) by mouth at bedtime. 12/30/13  Yes Reyne Dumas, MD  Lancets (ACCU-CHEK MULTICLIX) lancets Use as instructed 12/21/13   Heath Lark, MD    ALLERGY: No Known Allergies  ROS: ROS  NEUROLOGIC EXAM: Awake, alert, oriented Memory and concentration grossly intact Speech fluent, appropriate CN grossly intact Motor exam: Upper Extremities Deltoid Bicep Tricep Grip  Right 5/5 5/5 5/5 5/5  Left 5/5 5/5 5/5 5/5   Lower Extremity IP Quad PF DF EHL  Right 5/5 5/5 5/5 5/5 5/5  Left 5/5 5/5 5/5 5/5 5/5   Sensation grossly intact to LT  [facility]: MRI brain w/w/o Gad was reviewed which demonstrates a enhancing lesion measuring ~1.7cm in the right temporal pole with surrounding vasogenic edema. There is a larger, ~2.7cm enhancing lesion deep to the middle temp gyrus on the right. There is edema throughout the right temporal lobe with effacement of the right lat ventricle and R->L MLS.  IMPRESSION: - 58 y.o. male with newly discovered temporal lesions likely metastatic [place] lung CA. - Syncopal episodes may be SZ given location of tumors and his reported left hemiparesis which has resolved  PLAN: - Keppra 500mg  BID - Dexamethasone 4mg  q6 hrs - Pt will likely need resection of the two temporal lesions with preoperative SRS a few days prior, which would be done at [facility].  I did review the findings of the MRI with the pt and the likely need for surgery with adjuvant radiation. All his questions were answered.

## 2014-07-13 NOTE — ED Notes (Signed)
Per EMS, Patient woke up this morning and was walking around the house. Patient took wife to the cancer center and felt fine. At around 1230, Patient was walking from the kitchen and "fell into the my food." He complained of dizziness and lightheadedness. Patient stated he had a syncopal episode at this time. When He was able to get up, He walked back in the kitchen and fell again in the kitchen. Friend at the house called EMS. Before EMS arrived, patient started to complain of Left arm and leg weakness at 1300. Patient reports headache. Patient has a history of HTN, DM, High Cholesterol, and Lung Cancer. Vitals per EMS: 178/100, 98% on 2L, 80 HR. 151 CBG.

## 2014-07-13 NOTE — ED Notes (Signed)
House flow reports that patient will not be transferring to [facility] per Dr. Shanon Brow.  Spoke with [facility] and updated on patient status, he will not be transferring, and will be staying at [facility].

## 2014-07-13 NOTE — ED Notes (Signed)
[facility] called to update on transport time.  Verified new room placement for patient.

## 2014-07-13 NOTE — ED Notes (Signed)
MD at the bedside  

## 2014-07-13 NOTE — ED Notes (Signed)
Awaiting response in regards to possible neurosurgeon consult.

## 2014-07-13 NOTE — ED Notes (Signed)
Patient in room 1434 at [facility], Report given to [place], [place]

## 2014-07-14 ENCOUNTER — Other Ambulatory Visit: Payer: Self-pay | Admitting: Radiation Oncology

## 2014-07-14 ENCOUNTER — Other Ambulatory Visit: Payer: Self-pay | Admitting: Neurosurgery

## 2014-07-14 ENCOUNTER — Other Ambulatory Visit: Payer: Self-pay | Admitting: Hematology and Oncology

## 2014-07-14 ENCOUNTER — Telehealth: Payer: Self-pay | Admitting: Hematology and Oncology

## 2014-07-14 ENCOUNTER — Inpatient Hospital Stay (HOSPITAL_COMMUNITY): Payer: Medicaid Other

## 2014-07-14 ENCOUNTER — Inpatient Hospital Stay (HOSPITAL_COMMUNITY): Admit: 2014-07-14 | Discharge: 2014-07-14 | Disposition: A | Discharge status: Home / Self Care | Payer: Medicaid Other

## 2014-07-14 ENCOUNTER — Other Ambulatory Visit: Payer: Self-pay | Admitting: Internal Medicine

## 2014-07-14 ENCOUNTER — Ambulatory Visit
Admit: 2014-07-14 | Discharge: 2014-07-14 | Disposition: A | Discharge status: Home / Self Care | Payer: Medicaid Other | Attending: Radiation Oncology | Admitting: Radiation Oncology

## 2014-07-14 DIAGNOSIS — R1310 Dysphagia, unspecified: Secondary | ICD-10-CM

## 2014-07-14 DIAGNOSIS — C7931 Secondary malignant neoplasm of brain: Secondary | ICD-10-CM

## 2014-07-14 DIAGNOSIS — C3410 Malignant neoplasm of upper lobe, unspecified bronchus or lung: Secondary | ICD-10-CM

## 2014-07-14 DIAGNOSIS — I10 Essential (primary) hypertension: Secondary | ICD-10-CM

## 2014-07-14 DIAGNOSIS — Z510 Encounter for antineoplastic radiation therapy: Secondary | ICD-10-CM | POA: Insufficient documentation

## 2014-07-14 DIAGNOSIS — F17200 Nicotine dependence, unspecified, uncomplicated: Secondary | ICD-10-CM

## 2014-07-14 DIAGNOSIS — C3490 Malignant neoplasm of unspecified part of unspecified bronchus or lung: Secondary | ICD-10-CM | POA: Insufficient documentation

## 2014-07-14 DIAGNOSIS — C7949 Secondary malignant neoplasm of other parts of nervous system: Secondary | ICD-10-CM

## 2014-07-14 DIAGNOSIS — N179 Acute kidney failure, unspecified: Secondary | ICD-10-CM

## 2014-07-14 LAB — CBC
HEMATOCRIT: 47.6 % (ref 39.0–52.0)
HEMOGLOBIN: 15.3 g/dL (ref 13.0–17.0)
MCH: 26.7 pg (ref 26.0–34.0)
MCHC: 32.1 g/dL (ref 30.0–36.0)
MCV: 83.2 fL (ref 78.0–100.0)
Platelets: 184 10*3/uL (ref 150–400)
RBC: 5.72 MIL/uL (ref 4.22–5.81)
RDW: 16 % — ABNORMAL HIGH (ref 11.5–15.5)
WBC: 6.5 10*3/uL (ref 4.0–10.5)

## 2014-07-14 LAB — COMPREHENSIVE METABOLIC PANEL
ALBUMIN: 3.5 g/dL (ref 3.5–5.2)
ALT: 15 U/L (ref 0–53)
ANION GAP: 14 (ref 5–15)
AST: 15 U/L (ref 0–37)
Alkaline Phosphatase: 84 U/L (ref 39–117)
BUN: 10 mg/dL (ref 6–23)
CO2: 21 mEq/L (ref 19–32)
Calcium: 9.7 mg/dL (ref 8.4–10.5)
Chloride: 99 mEq/L (ref 96–112)
Creatinine, Ser: 1.04 mg/dL (ref 0.50–1.35)
GFR calc Af Amer: >90 mL/min (ref >90)
GFR calc non Af Amer: 78 mL/min — ABNORMAL LOW (ref >90)
Glucose, Bld: 213 mg/dL — ABNORMAL HIGH (ref 70–99)
Potassium: 4.4 mEq/L (ref 3.7–5.3)
SODIUM: 134 meq/L — AB (ref 137–147)
TOTAL PROTEIN: 7.3 g/dL (ref 6.0–8.3)
Total Bilirubin: 0.3 mg/dL (ref 0.3–1.2)

## 2014-07-14 LAB — GLUCOSE, CAPILLARY
GLUCOSE-CAPILLARY: 207 mg/dL — AB (ref 70–99)
Glucose-Capillary: 217 mg/dL — ABNORMAL HIGH (ref 70–99)
Glucose-Capillary: 124 mg/dL — ABNORMAL HIGH (ref 70–99)
Glucose-Capillary: 166 mg/dL — ABNORMAL HIGH (ref 70–99)
Glucose-Capillary: 308 mg/dL — ABNORMAL HIGH (ref 70–99)

## 2014-07-14 LAB — HEMOGLOBIN A1C
HEMOGLOBIN A1C: 6.8 % — AB (ref <5.7)
MEAN PLASMA GLUCOSE: 148 mg/dL — AB (ref <117)

## 2014-07-14 MED ORDER — LEVETIRACETAM 500 MG PO TABS
500.0000 mg | ORAL_TABLET | Freq: Two times a day (BID) | ORAL | Status: DC
  Administered 2014-07-14 – 2014-07-15 (×2): 500 mg via ORAL
  Filled 2014-07-14 (×4): qty 1

## 2014-07-14 MED ORDER — PNEUMOCOCCAL VAC POLYVALENT 25 MCG/0.5ML IJ INJ
0.5000 mL | INJECTION | INTRAMUSCULAR | Status: DC
  Filled 2014-07-14 (×3): qty 0.5

## 2014-07-14 MED ORDER — VITAMIN B-1 100 MG PO TABS
100.0000 mg | ORAL_TABLET | Freq: Every day | ORAL | Status: DC
  Administered 2014-07-15: 100 mg via ORAL
  Filled 2014-07-14: qty 1

## 2014-07-14 NOTE — Progress Notes (Signed)
Offsite EEG completed at [facility]. Results pending. 

## 2014-07-14 NOTE — Progress Notes (Signed)
OT Cancellation Note  Patient Details Name: AMADU SCHLAGETER MRN: [id number] DOB: [birth year]   Cancelled Treatment:    Reason Eval/Treat Not Completed: OT screened, no needs identified, will sign off. Spoke to pt and he feels he is at baseline with ADL. He declines needing to practice any of his ADL with OT. Will sign off per pt request.  Jules Schick [phone] 07/14/2014, 1:09 PM

## 2014-07-14 NOTE — Progress Notes (Signed)
OT Cancellation Note  Patient Details Name: Rodney Powers MRN: [id number] DOB: 1956-02-03   Cancelled Treatment:    Reason Eval/Treat Not Completed: Other (comment) Pt at radiation. Will check back later time.  Jules Schick [phone] 07/14/2014, 11:21 AM

## 2014-07-14 NOTE — Progress Notes (Signed)
  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  Identity was confirmed.  All relevant records and images related to the planned course of therapy were reviewed.  The patient freely provided informed written consent to proceed with treatment after reviewing the details related to the planned course of therapy. The consent form was witnessed and verified by the simulation staff. Intravenous access was established for contrast administration. Then, the patient was set-up in a stable reproducible supine position for radiation therapy.  A relocatable thermoplastic stereotactic head frame was fabricated for precise immobilization.  CT images were obtained.  Surface markings were placed.  The CT images were loaded into the planning software and fused with the patient's targeting MRI scan.  Then the target and avoidance structures were contoured.  Treatment planning then occurred.  The radiation prescription was entered and confirmed.  I have requested 3D planning  I have requested a DVH of the following structures: Brain stem, brain, left eye, right eye, lenses, optic chiasm, target volumes, uninvolved brain, and normal tissue.    PLAN:  The patient will receive 14 Gy in 1 fraction on Monday at 3 pm followed by surgical resection on Wednesday morning.  ________________________________  Sheral Apley. Tammi Klippel, M.D.

## 2014-07-14 NOTE — Progress Notes (Signed)
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 Alvy Bimler who recommended Decadron and transfer to [facility] long for consultation with radiation oncology.  -MRI Brain with 2 new enhancing right temporal lobe lesions consistent with metastasis, extensive vasogenic edema resulting in 10 mm min line shift -Radiation oncology aware of patient's admission and he is to be taken for simulation today and likely will need radiation while in the hospital>> follow for Dr. Johny Shears recommendations -He has not had any further syncopal episodes since admission and left-sided weakness has resolved, follow -Continue dose dexamethasone with close monitoring of his Accu-Cheks with SSI coverage -Will order EEG as recommended per neurology -Noted that neurosurgery is recommending Keppra 500 mg twice a day for seizure prophylaxis, and oncology agrees>> will start #2 history of lung cancer: He has finished chemotherapy and radiation treatment as of August of 2014. His oncologist, to address this further. -Patient to followup with Dr. Alvy Bimler for outpatient PET scan as per her 7/31 consult note and further recommendations/management thereafter. #3 diabetes mellitus, type II: Check HbA1c is pending continue sliding scale coverage. Continue holding his oral agents for now, follow and resume when appropriate #4 history of Tobacco abuse: Nicotine patch will be prescribed.  #5 history of alcohol abuse: But not excessive intake. Given thiamine. Monitor him closely for signs and symptoms of withdrawal and for that she is appropriate -He has no evidence of withdrawals at this time.  #6 history of COPD:  -Stable, Nebulizer treatments as needed. - Chest x-ray does not show  any new findings.  #7 nonspecific abdominal pain: He has had this for a few weeks and abdominal CT which showed predominantly chronic findings. Cholelithiasis, however, was also noted. Symptoms are mainly on the right side.  -LFTs  normal.  -Hepatic lesions are noted to be present since 2014. -Await pending right upper quadrant ultrasound to further eval   Code Status: Full Family Communication: None at bedside Disposition Plan: To home when medically ready   Consultants:  Radiation Oncology  Heme oncology  Procedures:  none  Antibiotics:  none  HPI/Subjective: States he feels better today, has not had any dizziness or further syncopal episodes. States Left upper extremity weakness now resolved  Objective: Filed Vitals:   07/14/14 0505  BP: 129/59  Pulse: 50  Temp: 97.8 F (36.6 C)  Resp: 20    Intake/Output Summary (Last 24 hours) at 07/14/14 0926 Last data filed at 07/14/14 0813  Gross per 24 hour  Intake 823.75 ml  Output    800 ml  Net  23.75 ml   Filed Weights   07/13/14 1424 07/13/14 2319  Weight: 108.41 kg (239 lb) 105.5 kg (232 lb 9.4 oz)    Exam:  General: alert & oriented x 3 In NAD Cardiovascular: RRR, nl S1 s2 Respiratory: CTAB, no crackles or wheezes Abdomen: soft +BS NT/ND Extremities: No cyanosis and no edema Neuro: Strength 5 out of 5 and symmetric, sensory grossly intact    Data Reviewed: Basic Metabolic Panel:  Recent Labs Lab 07/13/14 1400 07/13/14 1407 07/14/14 0414  NA 141 144 134*  K 3.8 3.7 4.4  CL 104 105 99  CO2 19  --  21  GLUCOSE 103* 106* 213*  BUN 8 7 10   CREATININE 1.13 1.20 1.04  CALCIUM 9.1  --  9.7   Liver Function Tests:  Recent Labs Lab 07/13/14 1400 07/14/14 0414  AST 17 15  ALT 16 15  ALKPHOS 77 84  BILITOT 0.3 0.3  PROT 6.9 7.3  ALBUMIN 3.5 3.5   No results found for this basename: LIPASE, AMYLASE,  in the last 168 hours No results found for this basename: AMMONIA,  in the last 168  hours CBC:  Recent Labs Lab 07/13/14 1400 07/13/14 1407 07/14/14 0414  WBC 4.2  --  6.5  NEUTROABS 2.9  --   --   HGB 14.6 16.7 15.3  HCT 43.9 49.0 47.6  MCV 82.8  --  83.2  PLT 171  --  184   Cardiac Enzymes: No results found for this basename: CKTOTAL, CKMB, CKMBINDEX, TROPONINI,  in the last 168 hours BNP (last 3 results) No results found for this basename: PROBNP,  in the last 8760 hours CBG:  Recent Labs Lab 07/13/14 2329 07/14/14 0730  GLUCAP 207* 217*    No results found for this or any previous visit (from the past 240 hour(s)).   Studies: Ct Head Wo Contrast  07/13/2014   CLINICAL DATA:  Code stroke.  Lung cancer.  EXAM: CT HEAD WITHOUT CONTRAST  TECHNIQUE: Contiguous axial images were obtained from the base of the skull through the vertex without intravenous contrast.  COMPARISON:  MRI 04/01/2013.  PET-CT 03/15/2013, CT 10/13/2011.  FINDINGS: Hyperdense approximately 3 cm possible mass in the right temporal lobe. Severe adjacent edema is noted. Midline shift of the third and right lateral ventricle to the left of approximately 7 mm. Ill-defined possible 1.3 cm mass right temporal lobe tip. Questionable mass noted along the left portion of the basilar cisterns, image number 7/series 2. These findings together suggest possible metastatic disease. Subtle amount of petechial hemorrhage within the masses cannot be excluded. Associated stroke cannot be excluded. No hydrocephalus. No acute bony abnormality. Right frontotemporal subcutaneous lipoma.  IMPRESSION: Findings worrisome for metastatic disease in this patient with known lung cancer. Prominent edema in the right temporoparietal region results in mass effect with midline shift from right to left of 7 mm. A component of mild hemorrhage within masses cannot be excluded. Gadolinium-enhanced MRI of the brain should be considered for further evaluation. These results were called by telephone at the time of interpretation on  07/13/2014 at 2:30 pm to Dr. Doy Mince, who verbally acknowledged these results.   Electronically Signed   By: Marcello Moores  Register   On: 07/13/2014 14:34   Mr Jeri Cos Wo Contrast  07/13/2014   CLINICAL DATA:  Lung cancer. Syncope. Evaluate for metastatic disease.  EXAM: MRI HEAD WITHOUT AND WITH CONTRAST  TECHNIQUE: Multiplanar, multiecho pulse sequences of the brain and surrounding structures were obtained without and with intravenous contrast.  CONTRAST:  35mL MULTIHANCE GADOBENATE DIMEGLUMINE 529 MG/ML IV SOLN  COMPARISON:  Head CT 07/13/2014 and MRI 04/01/2013  FINDINGS: Right frontotemporal scalp lipoma is again seen. Sebaceous cyst in the scalp at the skull vertex is unchanged. Mild heterogeneity of the bone marrow signal at the skullbase and upper cervical spine is again noted and similar to prior.  There is no acute infarct or extra-axial fluid collection. New enhancing lesions measure 1.7 x 1.4 cm in the anterior right temporal lobe (series 12, image 18), and 2.7 x 1.8 cm in the posterior right temporal lobe (series 12, image 27). There is extensive surrounding vasogenic edema throughout the right temporal lobe extending into the parietal and posterior frontal lobes. There is  mass effect on the right lateral ventricle with 10 mm of leftward midline shift. There is mild cerebral atrophy.  Orbits are unremarkable. Mild bilateral ethmoid air cell mucosal thickening and small left maxillary sinus mucous retention cysts are noted. Mastoid air cells are clear. Major intracranial vascular flow voids are preserved.  IMPRESSION: Two new enhancing right temporal lobe lesions, consistent with metastases. Extensive vasogenic edema results an 10 mm midline shift.   Electronically Signed   By: Logan Bores   On: 07/13/2014 17:38   Dg Chest Portable 1 View  07/13/2014   CLINICAL DATA:  Code stroke.  EXAM: PORTABLE CHEST - 1 VIEW  COMPARISON:  Chest radiograph June 22, 2014, CT of the chest February 22, 2014  FINDINGS:  Similar cardiomegaly. Mediastinal silhouette is nonsuspicious. Strandy density in left midlung zone, improved from prior examination. Stable COPD. Left lung base atelectasis versus scarring. No new consolidation. No pleural effusions. No pneumothorax.  Soft tissue planes and included osseous structures are nonsuspicious.  IMPRESSION: Improved aeration of left midlung zone, stable COPD. Stable cardiomegaly, no acute pulmonary process.   Electronically Signed   By: Elon Alas   On: 07/13/2014 15:01    Scheduled Meds: . dexamethasone  4 mg Intravenous 4 times per day  . docusate sodium  100 mg Oral BID  . gabapentin  800 mg Oral TID  . insulin aspart  0-15 Units Subcutaneous TID WC  . nicotine  14 mg Transdermal Daily  . [START ON 07/15/2014] pneumococcal 23 valent vaccine  0.5 mL Intramuscular Tomorrow-1000  . ramipril  2.5 mg Oral Daily  . simvastatin  20 mg Oral QHS  . sodium chloride  3 mL Intravenous Q12H  . thiamine IV  100 mg Intravenous Daily   Continuous Infusions: . sodium chloride 75 mL/hr at 07/13/14 2349    Principal Problem:   Brain metastases Active Problems:   ETOH abuse   Lung cancer   Type II or unspecified type diabetes mellitus without mention of complication, not stated as uncontrolled   Tobacco abuse   Brain mass    Time spent: [facility] Pager [phone]. If 7PM-7AM, please contact night-coverage at [email or web address], password [facility] 07/14/2014, 9:26 AM  LOS: 1 day

## 2014-07-14 NOTE — Telephone Encounter (Signed)
lvm for pt regarding to Aug appt...Marland KitchenMarland Kitchen

## 2014-07-14 NOTE — Progress Notes (Signed)
  Radiation Oncology         [phone] ________________________________  Name: Rodney Powers MRN: [id number]  Date: 07/13/2014  DOB: 1955/12/31  Chart Note:  This patient has completed his planning for stereotactic radiosurgery Monday.  His presenting symptoms appear to e resolved with steroids.  Plan:  At this point, the patient is set up to proceed with [facility] Monday and resection Wednesday for his 2 isolated right temporal brain mets.  It may be OK to consider discharge now.  If discharged I would recommend continue dexamethasone 4 mg QID and also refill hydrocodone/APAP 5/325 for pain and to help suppress cough during SRS treatment Monday.  ________________________________  Sheral Apley. Tammi Klippel, M.D.

## 2014-07-14 NOTE — Procedures (Signed)
ELECTROENCEPHALOGRAM REPORT   Patient: Rodney Powers       Room #: OO8757 EEG No. ID: [id number] Age: 58 y.o.        Sex: male Referring Physician: Allyson Sabal Report Date:  07/14/2014        Interpreting Physician: Richardson Dopp  History: [name] is an 58 y.o. male with episodes of syncope and brain metastases  Medications:  Scheduled: . dexamethasone  4 mg Intravenous 4 times per day  . docusate sodium  100 mg Oral BID  . gabapentin  800 mg Oral TID  . insulin aspart  0-15 Units Subcutaneous TID WC  . levETIRAcetam  500 mg Oral BID  . nicotine  14 mg Transdermal Daily  . [START ON 07/15/2014] pneumococcal 23 valent vaccine  0.5 mL Intramuscular Tomorrow-1000  . ramipril  2.5 mg Oral Daily  . simvastatin  20 mg Oral QHS  . sodium chloride  3 mL Intravenous Q12H  . [START ON 07/15/2014] thiamine  100 mg Oral Daily    Conditions of Recording:  This is a 16 channel EEG carried out with the patient in the awake and drowsy states.  Description:  The waking background activity consists of a low voltage, symmetrical, fairly well organized, 9-10 Hz alpha activity, seen from the parieto-occipital and posterior temporal regions.  Low voltage fast activity, poorly organized, is seen anteriorly and is at times superimposed on more posterior regions.  A mixture of theta and alpha rhythms are seen from the central and temporal regions. The patient drowses with slowing to irregular, low voltage theta and beta activity.   Stage II sleep was not obtained.   Hyperventilation and intermittent photic stimulation were not performed.   IMPRESSION: This is a normal electroencephalogram.  No epileptiform activity is noted.    Alexis Goodell, MD [facility] [phone] 07/14/2014, 6:52 PM

## 2014-07-14 NOTE — Progress Notes (Signed)
SLP Cancellation Note  Patient Details Name: Rodney Powers MRN: [id number] DOB: 07/27/1956   Cancelled treatment:        Unable to complete BSE at this time, as pt is NPO for ultrasound. Pt reported no history of swallowing problems.  Will continue efforts. RN aware.  Celia B. Crowley, [facility], [place]  Shonna Chock 07/14/2014, 2:04 PM

## 2014-07-14 NOTE — Consult Note (Signed)
[facility] CONSULT NOTE  Patient Care Team: Lorayne Marek, MD as PCP - General (Internal Medicine) Rigoberto Noel, MD as Attending Physician (Pulmonary Disease) Louellen Molder, MD as Attending Physician (Internal Medicine) Heath Lark, MD as Consulting Physician (Hematology and Oncology)  CHIEF COMPLAINTS/PURPOSE OF CONSULTATION:  Multiple brain metastasis, on background history of non-small cell lung cancer  HISTORY OF PRESENTING ILLNESS:  Rodney Powers 58 y.o. male is here because of recent syncopal episode. This is a patient who was discovered to have lung cancer after presentation with dysphagia.  Mediastinal mass resection on 03/24/2013 showed poorly differentiated non-small cell carcinoma, suspicious for squamous cell. He completed Chemoradiation with weekly chemo Carboplatin/Taxol and daily XRT (04/18/2013-05/27/2013). In July of 2014, he had repeat laryngoscopy, EGD and biopsy and tonsillectomy which showed no evidence of cancer.  He had surveillance imaging study with bone scan in December 2014 we show no evidence of metastatic disease.  Serial CT scan of the chest in August 2014, November 2014 and March 2015 showed no definitive signs of active cancer. He was on observation. He was admitted to the emergency department yesterday after presentation with mild left-sided weakness and brief episode of syncopal episode. According to the emergency department physician assessment, he had been complaining of dizziness and headaches. When I questioned the patient, he denies any headaches. He denies recent nausea. He have chronic nonproductive cough but no recent hemoptysis. The patient's is smoking half a pack of cigarettes per day. He denies any chest pain recently. His appetite is stable, denies any weight loss. He complained of mild dysphagia on a chronic basis since his prior treatment. He denies choking sensation when he eats food. No recent aspiration  episodes.   MEDICAL HISTORY:  Past Medical History  Diagnosis Date  . Back pain, chronic   . Mediastinal mass   . Urinary retention   . Lung cancer 04/07/2013  . Diabetic neuropathy   . Diabetes mellitus, type II     takes Gimepimide and Metformin daily  . Enlarged prostate     takes Flomax every evening  . Hypertension     slightly elevated recently;no meds;just watching for now  . COPD (chronic obstructive pulmonary disease)     emphysema  . Shortness of breath     with exertion  . Pneumonia     hx of;last time about 39yrs ago  . History of bronchitis     last time 31yrs ago  . Joint pain     stiffness and cramping in both hands  . Urinary frequency   . Nocturia   . Anemia     no meds and hasn't had a transfusion ever  . Anxiety   . Depression     no meds required;hospitatlized 43yrs ago for this  . Hx of radiation therapy 04/19/13- 06/07/13    left upper central chest, 63 gray 35 fx  . Heart murmur     as a baby  . Diarrhea   . Neuropathy   . Back pain 11/14/2013  . On home O2     2L N/C    SURGICAL HISTORY: Past Surgical History  Procedure Laterality Date  . Video bronchoscopy with endobronchial ultrasound Bilateral 03/10/2013    Procedure: VIDEO BRONCHOSCOPY WITH ENDOBRONCHIAL ULTRASOUND;  Surgeon: Rigoberto Noel, MD;  Location: [facility];  Service: Pulmonary;  Laterality: Bilateral;  . Mediastinotomy chamberlain mcneil Left 03/24/2013    Procedure: MEDIASTINOTOMY CHAMBERLAIN MCNEIL;  Surgeon: Melrose Nakayama, MD;  Location:  [facility] OR;  Service: Thoracic;  Laterality: Left;  . Excisional hemorrhoidectomy      > 30 yrs ago  . Colonoscopy    . Direct laryngoscopy N/A 07/01/2013    Procedure: DIRECT LARYNGOSCOPY;  Surgeon: Izora Gala, MD;  Location: [facility];  Service: ENT;  Laterality: N/A;  . Esophagoscopy N/A 07/01/2013    Procedure: ESOPHAGOSCOPY WITH BIOPSY;  Surgeon: Izora Gala, MD;  Location: [facility] OR;  Service: ENT;  Laterality: N/A;  . Direct laryngoscopy N/A  08/01/2013    Procedure: DIRECT LARYNGOSCOPY WITH EXCISION OF PHARYNGEAL MASS;  Surgeon: Izora Gala, MD;  Location: [facility] [facility];  Service: ENT;  Laterality: N/A;  . Tonsillectomy Right 08/01/2013    Procedure: TONSILLECTOMY;  Surgeon: Izora Gala, MD;  Location: [place];  Service: ENT;  Laterality: Right;    SOCIAL HISTORY: History   Social History  . Marital Status: Legally Separated    Spouse Name: N/A    Number of Children: 3  . Years of Education: N/A   Occupational History  .      [employer]   Social History Main Topics  . Smoking status: Current Every Day Smoker -- 0.50 packs/day for 41 years    Types: Cigarettes    Start date: 07/18/1971  . Smokeless tobacco: Never Used     Comment: nicotene patch  . Alcohol Use: 8.4 oz/week    14 Cans of beer per week     Comment: daily beer 2  . Drug Use: No     Comment: History of Marijuana use 30 years ago  . Sexual Activity: No   Other Topics Concern  . Not on file   Social History Narrative  . No narrative on file    FAMILY HISTORY: Family History  Problem Relation Age of Onset  . Aneurysm Mother   . Heart attack Father   . Aneurysm Sister   . Cancer Brother     ALLERGIES:  has No Known Allergies.  MEDICATIONS:  Current Facility-Administered Medications  Medication Dose Route Frequency Provider Last Rate Last Dose  . 0.9 %  sodium chloride infusion   Intravenous Continuous Bonnielee Haff, MD 75 mL/hr at 07/13/14 2349    . acetaminophen (TYLENOL) tablet 650 mg  650 mg Oral Q6H PRN Bonnielee Haff, MD       Or  . acetaminophen (TYLENOL) suppository 650 mg  650 mg Rectal Q6H PRN Bonnielee Haff, MD      . albuterol (PROVENTIL) (2.5 MG/3ML) 0.083% nebulizer solution 2.5 mg  2.5 mg Nebulization Q2H PRN Bonnielee Haff, MD      . dexamethasone (DECADRON) injection 4 mg  4 mg Intravenous 4 times per day Bonnielee Haff, MD   4 mg at 07/14/14 0522  . docusate sodium (COLACE) capsule 100 mg  100 mg Oral BID Bonnielee Haff, MD   100 mg  at 07/13/14 2349  . gabapentin (NEURONTIN) capsule 800 mg  800 mg Oral TID Bonnielee Haff, MD   800 mg at 07/13/14 2350  . insulin aspart (novoLOG) injection 0-15 Units  0-15 Units Subcutaneous TID WC Bonnielee Haff, MD   5 Units at 07/14/14 0748  . LORazepam (ATIVAN) injection 0.5 mg  0.5 mg Intravenous Q6H PRN Bonnielee Haff, MD      . morphine 2 MG/ML injection 1 mg  1 mg Intravenous Q4H PRN Bonnielee Haff, MD      . nicotine (NICODERM CQ - dosed in mg/24 hours) patch 14 mg  14 mg Transdermal Daily Bonnielee Haff,  MD      . ondansetron (ZOFRAN) tablet 4 mg  4 mg Oral Q6H PRN Bonnielee Haff, MD       Or  . ondansetron [facility]) injection 4 mg  4 mg Intravenous Q6H PRN Bonnielee Haff, MD      . ramipril (ALTACE) capsule 2.5 mg  2.5 mg Oral Daily Bonnielee Haff, MD      . simvastatin (ZOCOR) tablet 20 mg  20 mg Oral QHS Bonnielee Haff, MD      . sodium chloride 0.9 % injection 3 mL  3 mL Intravenous Q12H Bonnielee Haff, MD   3 mL at 07/13/14 2353  . thiamine (B-1) injection 100 mg  100 mg Intravenous Daily Bonnielee Haff, MD   100 mg at 07/13/14 2350    REVIEW OF SYSTEMS:   Constitutional: Denies fevers, chills or abnormal night sweats Eyes: Denies blurriness of vision, double vision or watery eyes Ears, nose, mouth, throat, and face: Denies mucositis or sore throat Cardiovascular: Denies palpitation, chest discomfort or lower extremity swelling Gastrointestinal:  Denies nausea, heartburn or change in bowel habits Skin: Denies abnormal skin rashes Lymphatics: Denies new lymphadenopathy or easy bruising Behavioral/Psych: Mood is stable, no new changes  All other systems were reviewed with the patient and are negative.  PHYSICAL EXAMINATION: ECOG PERFORMANCE STATUS: 1 - Symptomatic but completely ambulatory  Filed Vitals:   07/14/14 0505  BP: 129/59  Pulse: 50  Temp: 97.8 F (36.6 C)  Resp: 20   Filed Weights   07/13/14 1424 07/13/14 2319  Weight: 239 lb (108.41 kg) 232 lb 9.4 oz  (105.5 kg)    GENERAL:alert, no distress and comfortable SKIN: skin color, texture, turgor are normal, no rashes or significant lesions EYES: normal, conjunctiva are pink and non-injected, sclera clear OROPHARYNX:no exudate, no erythema and lips, buccal mucosa, and tongue normal  NECK: supple, thyroid normal size, non-tender, without nodularity LYMPH:  no palpable lymphadenopathy in the cervical, axillary or inguinal LUNGS: clear to auscultation and percussion with normal breathing effort HEART: regular rate & rhythm and no murmurs and no lower extremity edema ABDOMEN:abdomen soft, non-tender and normal bowel sounds Musculoskeletal:no cyanosis of digits and no clubbing . Noted a subcutaneous lipoma on the right for head. PSYCH: alert & oriented x 3 with fluent speech NEURO: no focal motor/sensory deficits  LABORATORY DATA:  I have reviewed the data as listed Lab Results  Component Value Date   WBC 6.5 07/14/2014   HGB 15.3 07/14/2014   HCT 47.6 07/14/2014   MCV 83.2 07/14/2014   PLT 184 07/14/2014    Recent Labs  06/22/14 0855 07/13/14 1400 07/13/14 1407 07/14/14 0414  NA 140 141 144 134*  K 4.2 3.8 3.7 4.4  CL 103 104 105 99  CO2 23 19  --  21  GLUCOSE 128* 103* 106* 213*  BUN 14 8 7 10   CREATININE 1.27 1.13 1.20 1.04  CALCIUM 9.4 9.1  --  9.7  GFRNONAA 62* 71*  --  78*  GFRAA 71* 82*  --  >90  PROT 7.3 6.9  --  7.3  ALBUMIN 3.8 3.5  --  3.5  AST 20 17  --  15  ALT 22 16  --  15  ALKPHOS 89 77  --  84  BILITOT 0.2* 0.3  --  0.3    RADIOGRAPHIC STUDIES: Of note, his baseline MRI of the brain in April 2014 showed no evidence of intracranial metastasis. I have personally reviewed the radiological images as listed  and agreed with the findings in the report. Dg Chest 2 View  06/22/2014   CLINICAL DATA:  Chest pain and shortness of breath.  EXAM: CHEST  2 VIEW  COMPARISON:  PA and lateral chest 10/13/2011. Single view of the chest 03/25/2013. CT chest 02/22/2014.  FINDINGS:  Bullous emphysematous disease is again seen. Perihilar scarring on the left consistent with prior radiation therapy is again identified. There is no new airspace disease or nodule. No pneumothorax or pleural effusion is identified.  IMPRESSION: No acute abnormality.  Post treatment change left upper lobe.  Emphysema.   Electronically Signed   By: Inge Rise M.D.   On: 06/22/2014 09:10   Ct Head Wo Contrast  07/13/2014   CLINICAL DATA:  Code stroke.  Lung cancer.  EXAM: CT HEAD WITHOUT CONTRAST  TECHNIQUE: Contiguous axial images were obtained from the base of the skull through the vertex without intravenous contrast.  COMPARISON:  MRI 04/01/2013.  PET-CT 03/15/2013, CT 10/13/2011.  FINDINGS: Hyperdense approximately 3 cm possible mass in the right temporal lobe. Severe adjacent edema is noted. Midline shift of the third and right lateral ventricle to the left of approximately 7 mm. Ill-defined possible 1.3 cm mass right temporal lobe tip. Questionable mass noted along the left portion of the basilar cisterns, image number 7/series 2. These findings together suggest possible metastatic disease. Subtle amount of petechial hemorrhage within the masses cannot be excluded. Associated stroke cannot be excluded. No hydrocephalus. No acute bony abnormality. Right frontotemporal subcutaneous lipoma.  IMPRESSION: Findings worrisome for metastatic disease in this patient with known lung cancer. Prominent edema in the right temporoparietal region results in mass effect with midline shift from right to left of 7 mm. A component of mild hemorrhage within masses cannot be excluded. Gadolinium-enhanced MRI of the brain should be considered for further evaluation. These results were called by telephone at the time of interpretation on 07/13/2014 at 2:30 pm to Dr. Doy Mince, who verbally acknowledged these results.   Electronically Signed   By: Marcello Moores  Register   On: 07/13/2014 14:34   Mr Jeri Cos Wo Contrast  07/13/2014    CLINICAL DATA:  Lung cancer. Syncope. Evaluate for metastatic disease.  EXAM: MRI HEAD WITHOUT AND WITH CONTRAST  TECHNIQUE: Multiplanar, multiecho pulse sequences of the brain and surrounding structures were obtained without and with intravenous contrast.  CONTRAST:  50mL MULTIHANCE GADOBENATE DIMEGLUMINE 529 MG/ML IV SOLN  COMPARISON:  Head CT 07/13/2014 and MRI 04/01/2013  FINDINGS: Right frontotemporal scalp lipoma is again seen. Sebaceous cyst in the scalp at the skull vertex is unchanged. Mild heterogeneity of the bone marrow signal at the skullbase and upper cervical spine is again noted and similar to prior.  There is no acute infarct or extra-axial fluid collection. New enhancing lesions measure 1.7 x 1.4 cm in the anterior right temporal lobe (series 12, image 18), and 2.7 x 1.8 cm in the posterior right temporal lobe (series 12, image 27). There is extensive surrounding vasogenic edema throughout the right temporal lobe extending into the parietal and posterior frontal lobes. There is mass effect on the right lateral ventricle with 10 mm of leftward midline shift. There is mild cerebral atrophy.  Orbits are unremarkable. Mild bilateral ethmoid air cell mucosal thickening and small left maxillary sinus mucous retention cysts are noted. Mastoid air cells are clear. Major intracranial vascular flow voids are preserved.  IMPRESSION: Two new enhancing right temporal lobe lesions, consistent with metastases. Extensive vasogenic edema results an 10 mm midline  shift.   Electronically Signed   By: Logan Bores   On: 07/13/2014 17:38   Ct Abdomen Pelvis W Contrast  06/22/2014   CLINICAL DATA:  Right-sided mid abdominal pain for 4 days. Past Medical history is significant for lung cancer.  EXAM: CT ABDOMEN AND PELVIS WITH CONTRAST  TECHNIQUE: Multidetector CT imaging of the abdomen and pelvis was performed using the standard protocol following bolus administration of intravenous contrast.  CONTRAST:  178mL  OMNIPAQUE IOHEXOL 300 MG/ML  SOLN  COMPARISON:  Lumbar spine MRI 01/10/2014; prior PET-CT 03/15/2013; prior CT abdomen/ pelvis with contrast 03/08/2013  FINDINGS: Lower Chest: Respiratory motion slightly limits evaluation of the lung bases. However, there is a combination of atelectasis and consolidation in the posteromedial right lower lobe superimposed on a background of emphysema. More mild atelectasis and chronic scarring present in the visualized left lower lobe. Stable 5 mm nodule in the right middle lobe (image 7 series 3). Visualized cardiac structures remain within normal limits for size. No pericardial effusion. Calcification of the aortic valve noted incidentally. Unremarkable visualized distal thoracic aorta.  Abdomen: Punctate calcification within the wall of the gastric fundus remains unchanged dating back to March of 2014 and is likely not clinically significant. Unremarkable appearance of the duodenum, spleen, adrenal glands, and pancreas.  Continued stability of indeterminate hypo attenuating lesions throughout the right hepatic lobe dating back to March 08, 2013. These are not hypermetabolic on the prior PET-CT. Numerous gas containing stones layer within the gallbladder lumen. No secondary signs to suggest acute cholecystitis. No intra or extrahepatic biliary ductal dilatation.  No enhancing renal lesion, nephrolithiasis or hydronephrosis. No significant interval change in a multiple low-attenuation cystic lesions throughout both kidneys. Many of the lesions 2 small for accurate characterization but remain unchanged in size, number and configuration. The largest lesion in the lower pole the left kidney remains consistent with a simple cyst.  No evidence of obstruction or focal bowel wall thickening. Normal appendix in the right lower quadrant. The terminal ileum is unremarkable. No suspicious adenopathy or free fluid.  Pelvis: Marked prostatomegaly. The prostate gland measures 6.2 x 6.7 x  approximately 5.7 cm yielding an estimated volume of 118 cc.  Bones/Soft Tissues: No acute fracture or aggressive appearing lytic or blastic osseous lesion.  Vascular: Atherosclerotic vascular disease without significant stenosis or aneurysmal dilatation.  IMPRESSION: 1. No acute abnormality to explain the patient's clinical symptoms. 2. Cholelithiasis. 3. Marked prostatomegaly.  The estimated prostatic volume is 118 cc. 4. Stable indeterminate hepatic lesions dating back to 03/08/2013. These lesions were not hypermetabolic on the prior PET-CT. 5. Stable right middle lobe 5 mm pulmonary nodule. 6. Additional ancillary findings as above without significant interval change.   Electronically Signed   By: Jacqulynn Cadet M.D.   On: 06/22/2014 11:16    ASSESSMENT & PLAN:  #1 T4, N1, M1 non-small cell lung cancer with intracranial metastasis The patient will need full staging PET scan as an outpatient. I agree with radiation oncologist assessment to get him started on radiation to therapy to the brain metastasis as well as possible. Depending on the results of the PET/CT scan, we can make a decision about whether the patient would be a candidate for surgical resection of the brain metastasis. If the patient has large disease burden outside the brain, I would favor systemic chemotherapy. I agree with high-dose dexamethasone and prophylaxis against risk of seizure with Keppra. #2 dysphagia it is not clear whether the dysphagia is due to intracranial  metastasis or related to prior radiation treatment to his chest. I recommend speech and language therapy evaluation. #3 diabetes I recommend close monitoring of his blood sugar while on high-dose dexamethasone. #4 smoking I spent some time counseling the patient the importance of tobacco cessation. he is currently attempting to quit on his own. #5 CODE STATUS The patient is currently is at full code #6 discharge planning I have ordered a PET CT scan to be  done as an outpatient, tentatively on 07/24/2014. I plan to see him in my clinic on 07/25/2014. If the patient remains stable clinically, he can be discharged from the hospital soon, at the discretion of the hospitalist.     All questions were answered. The patient knows to call the clinic with any problems, questions or concerns.    Domino, Driscoll, MD 07/14/2014 8:20 AM

## 2014-07-14 NOTE — Progress Notes (Signed)
This patient is receiving thiamine. Based on criteria approved by the Pharmacy and Therapeutics Committee, this medication is being converted to the equivalent oral dose form. These criteria include:   . The patient is eating (either orally or per tube) and/or has been taking other orally administered medications for at least 24 hours.  . This patient has no evidence of active gastrointestinal bleeding or impaired GI absorption (gastrectomy, short bowel, patient on TNA or NPO).   If you have questions about this conversion, please contact the pharmacy department.  Kara Mead, [facility] 07/14/2014 12:29 PM

## 2014-07-14 NOTE — Consult Note (Signed)
Radiation Oncology         [phone] ________________________________  Initial inpatient Consultation  Name: Rodney Powers MRN: [id number]  Date: 07/13/2014  DOB: [birth year]  CH:ENIDPO, Vernon Prey, MD  No ref. provider found   REFERRING PHYSICIAN: No ref. 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  His follow-up imaging studies including chest CT through March 2015 showed no evidence of recurrence.  Last evening the patient presented with left hemiparesis and CT and subsequent brain MRI showed 2 isolated right temporal brain mets (2.7 cm post, 1.7 cm ant).  Dr. Kathyrn Sheriff evaluated the patient and felt that resection of both may be indicated given the degree of mass effect.  In order to prevent recurrence, we have kindly been asked to evaluate the patient for pre-op SRS.  PREVIOUS RADIATION THERAPY: Yes as above  PAST MEDICAL HISTORY:  has a past medical history of Back pain, chronic; Mediastinal mass; Urinary retention; Lung cancer (04/07/2013); Diabetic neuropathy; Diabetes mellitus, type II; Enlarged prostate; Hypertension; COPD (chronic obstructive pulmonary disease); Shortness of breath; Pneumonia; History of bronchitis; Joint pain; Urinary frequency; Nocturia; Anemia; Anxiety; Depression; radiation therapy (04/19/13- 06/07/13); Heart murmur; Diarrhea; Neuropathy; Back pain (11/14/2013); and On home O2.    PAST SURGICAL HISTORY: Past Surgical History  Procedure Laterality Date  . Video bronchoscopy with endobronchial ultrasound Bilateral 03/10/2013    Procedure: VIDEO BRONCHOSCOPY WITH ENDOBRONCHIAL ULTRASOUND;  Surgeon: Rigoberto Noel, MD;  Location: [place];  Service: Pulmonary;  Laterality: Bilateral;  .  Mediastinotomy chamberlain mcneil Left 03/24/2013    Procedure: MEDIASTINOTOMY CHAMBERLAIN MCNEIL;  Surgeon: Melrose Nakayama, MD;  Location: [place];  Service: Thoracic;  Laterality: Left;  . Excisional hemorrhoidectomy      > 30 yrs ago  . Colonoscopy    . Direct laryngoscopy N/A 07/01/2013    Procedure: DIRECT LARYNGOSCOPY;  Surgeon: Izora Gala, MD;  Location: [place];  Service: ENT;  Laterality: N/A;  . Esophagoscopy N/A 07/01/2013    Procedure: ESOPHAGOSCOPY WITH BIOPSY;  Surgeon: Izora Gala, MD;  Location: [place];  Service: ENT;  Laterality: N/A;  . Direct laryngoscopy N/A 08/01/2013    Procedure: DIRECT LARYNGOSCOPY WITH EXCISION OF PHARYNGEAL MASS;  Surgeon: Izora Gala, MD;  Location: [place];  Service: ENT;  Laterality: N/A;  . Tonsillectomy Right 08/01/2013    Procedure: TONSILLECTOMY;  Surgeon: Izora Gala, MD;  Location: [facility] OR;  Service: ENT;  Laterality: Right;    FAMILY HISTORY: family history includes Aneurysm in his mother and sister; Cancer in his brother; Heart attack in his father.  SOCIAL HISTORY:  reports that he has been smoking Cigarettes.  He started smoking about 43 years ago. He has a 20.5 pack-year smoking history. He has never used smokeless tobacco. He reports that he drinks about 8.4 ounces of alcohol per week. He reports that he does not use illicit drugs.  ALLERGIES: Review of patient's allergies indicates no known allergies.  MEDICATIONS:  Current Facility-Administered Medications  Medication Dose Route Frequency Provider Last Rate Last Dose  . acetaminophen (TYLENOL) tablet 650 mg  650 mg Oral Q6H PRN Bonnielee Haff, MD       Or  . acetaminophen (TYLENOL) suppository 650 mg  650 mg Rectal Q6H PRN Gokul  [place] Pink, MD      . albuterol (PROVENTIL) (2.5 MG/3ML) 0.083% nebulizer solution 2.5 mg  2.5 mg Nebulization Q2H PRN Bonnielee Haff, MD      . dexamethasone (DECADRON) injection 4 mg  4 mg Intravenous 4 times per day Bonnielee Haff, MD   4 mg at 07/14/14  1737  . docusate sodium (COLACE) capsule 100 mg  100 mg Oral BID Bonnielee Haff, MD   100 mg at 07/14/14 1024  . gabapentin (NEURONTIN) capsule 800 mg  800 mg Oral TID Bonnielee Haff, MD   800 mg at 07/14/14 1736  . insulin aspart (novoLOG) injection 0-15 Units  0-15 Units Subcutaneous TID WC Bonnielee Haff, MD   2 Units at 07/14/14 1306  . levETIRAcetam (KEPPRA) tablet 500 mg  500 mg Oral BID Sheila Oats, MD   500 mg at 07/14/14 1736  . LORazepam (ATIVAN) injection 0.5 mg  0.5 mg Intravenous Q6H PRN Bonnielee Haff, MD      . morphine 2 MG/ML injection 1 mg  1 mg Intravenous Q4H PRN Bonnielee Haff, MD      . nicotine (NICODERM CQ - dosed in mg/24 hours) patch 14 mg  14 mg Transdermal Daily Bonnielee Haff, MD   14 mg at 07/14/14 1022  . ondansetron (ZOFRAN) tablet 4 mg  4 mg Oral Q6H PRN Bonnielee Haff, MD       Or  . ondansetron (ZOFRAN) injection 4 mg  4 mg Intravenous Q6H PRN Bonnielee Haff, MD      . Derrill Memo ON 07/15/2014] pneumococcal 23 valent vaccine (PNU-IMMUNE) injection 0.5 mL  0.5 mL Intramuscular Tomorrow-1000 Phillips Grout, MD      . ramipril (ALTACE) capsule 2.5 mg  2.5 mg Oral Daily Bonnielee Haff, MD   2.5 mg at 07/14/14 1024  . simvastatin (ZOCOR) tablet 20 mg  20 mg Oral QHS Bonnielee Haff, MD      . sodium chloride 0.9 % injection 3 mL  3 mL Intravenous Q12H Bonnielee Haff, MD   3 mL at 07/14/14 1032  . [START ON 07/15/2014] thiamine (VITAMIN B-1) tablet 100 mg  100 mg Oral Daily Kara Mead, [facility]        REVIEW OF SYSTEMS:  A 15 point review of systems is documented in the electronic medical record. This was obtained by the nursing staff. However, I reviewed this with the patient to discuss relevant findings and make appropriate changes.  Pertinent items are noted in HPI.   PHYSICAL EXAM:  height is 6\' 3"  (1.905 m) and weight is 232 lb 9.4 oz (105.5 kg). His oral temperature is 96.8 F (36 C). His blood pressure is 154/74 and his pulse is 58. His respiration is 18 and  oxygen saturation is 96%.   per neurosurgery NEUROLOGIC EXAM:  Awake, alert, oriented  Memory and concentration grossly intact  Speech fluent, appropriate  CN grossly intact  Motor exam:  Upper Extremities  Deltoid  Bicep  Tricep  Grip   Right  5/5  5/5  5/5  5/5   Left  5/5  5/5  5/5  5/5    Lower Extremity  IP  Quad  PF  DF  EHL   Right  5/5  5/5  5/5  5/5  5/5   Left  5/5  5/5  5/5  5/5  5/5   Sensation grossly intact to LT     KPS = 80   100 - Normal; no complaints; no evidence of disease. 90   -  Able to carry on normal activity; minor signs or symptoms of disease. 80   - Normal activity with effort; some signs or symptoms of disease. 74   - Cares for self; unable to carry on normal activity or to do active work. 60   - Requires occasional assistance, but is able to care for most of his personal needs. 50   - Requires considerable assistance and frequent medical care. 58   - Disabled; requires special care and assistance. 27   - Severely disabled; hospital admission is indicated although death not imminent. 40   - Very sick; hospital admission necessary; active supportive treatment necessary. 10   - Moribund; fatal processes progressing rapidly. 0     - Dead  Karnofsky DA, Abelmann Macy, Craver LS and Burchenal [facility] [phone]) The use of the nitrogen mustards in the palliative treatment of carcinoma: with particular reference to bronchogenic carcinoma Cancer 1 634-56  LABORATORY DATA:  Lab Results  Component Value Date   WBC 6.5 07/14/2014   HGB 15.3 07/14/2014   HCT 47.6 07/14/2014   MCV 83.2 07/14/2014   PLT 184 07/14/2014   Lab Results  Component Value Date   NA 134* 07/14/2014   K 4.4 07/14/2014   CL 99 07/14/2014   CO2 21 07/14/2014   Lab Results  Component Value Date   ALT 15 07/14/2014   AST 15 07/14/2014   ALKPHOS 84 07/14/2014   BILITOT 0.3 07/14/2014     RADIOGRAPHY: Dg Chest 2 View  06/22/2014   CLINICAL DATA:  Chest pain and shortness of breath.  EXAM: CHEST  2  VIEW  COMPARISON:  PA and lateral chest 10/13/2011. Single view of the chest 03/25/2013. CT chest 02/22/2014.  FINDINGS: Bullous emphysematous disease is again seen. Perihilar scarring on the left consistent with prior radiation therapy is again identified. There is no new airspace disease or nodule. No pneumothorax or pleural effusion is identified.  IMPRESSION: No acute abnormality.  Post treatment change left upper lobe.  Emphysema.   Electronically Signed   By: Inge Rise M.D.   On: 06/22/2014 09:10   Ct Head Wo Contrast  07/13/2014   CLINICAL DATA:  Code stroke.  Lung cancer.  EXAM: CT HEAD WITHOUT CONTRAST  TECHNIQUE: Contiguous axial images were obtained from the base of the skull through the vertex without intravenous contrast.  COMPARISON:  MRI 04/01/2013.  PET-CT 03/15/2013, CT 10/13/2011.  FINDINGS: Hyperdense approximately 3 cm possible mass in the right temporal lobe. Severe adjacent edema is noted. Midline shift of the third and right lateral ventricle to the left of approximately 7 mm. Ill-defined possible 1.3 cm mass right temporal lobe tip. Questionable mass noted along the left portion of the basilar cisterns, image number 7/series 2. These findings together suggest possible metastatic disease. Subtle amount of petechial hemorrhage within the masses cannot be excluded. Associated stroke cannot be excluded. No hydrocephalus. No acute bony abnormality. Right frontotemporal subcutaneous lipoma.  IMPRESSION: Findings worrisome for metastatic disease in this patient with known lung cancer. Prominent edema in the right temporoparietal region results in mass effect with midline shift from right to left of 7 mm. A component of mild hemorrhage within masses cannot be excluded. Gadolinium-enhanced MRI of the brain should be considered for further evaluation. These results were called by telephone at the time of interpretation on 07/13/2014 at 2:30 pm to Dr. Doy Mince, who verbally acknowledged these  results.   Electronically Signed   By: Marcello Moores  Register   On: 07/13/2014  14:34   Mr Jeri Cos VV Contrast  07/13/2014   CLINICAL DATA:  Lung cancer. Syncope. Evaluate for metastatic disease.  EXAM: MRI HEAD WITHOUT AND WITH CONTRAST  TECHNIQUE: Multiplanar, multiecho pulse sequences of the brain and surrounding structures were obtained without and with intravenous contrast.  CONTRAST:  86mL MULTIHANCE GADOBENATE DIMEGLUMINE 529 MG/ML IV SOLN  COMPARISON:  Head CT 07/13/2014 and MRI 04/01/2013  FINDINGS: Right frontotemporal scalp lipoma is again seen. Sebaceous cyst in the scalp at the skull vertex is unchanged. Mild heterogeneity of the bone marrow signal at the skullbase and upper cervical spine is again noted and similar to prior.  There is no acute infarct or extra-axial fluid collection. New enhancing lesions measure 1.7 x 1.4 cm in the anterior right temporal lobe (series 12, image 18), and 2.7 x 1.8 cm in the posterior right temporal lobe (series 12, image 27). There is extensive surrounding vasogenic edema throughout the right temporal lobe extending into the parietal and posterior frontal lobes. There is mass effect on the right lateral ventricle with 10 mm of leftward midline shift. There is mild cerebral atrophy.  Orbits are unremarkable. Mild bilateral ethmoid air cell mucosal thickening and small left maxillary sinus mucous retention cysts are noted. Mastoid air cells are clear. Major intracranial vascular flow voids are preserved.  IMPRESSION: Two new enhancing right temporal lobe lesions, consistent with metastases. Extensive vasogenic edema results an 10 mm midline shift.   Electronically Signed   By: Logan Bores   On: 07/13/2014 17:38   Ct Abdomen Pelvis W Contrast  06/22/2014   CLINICAL DATA:  Right-sided mid abdominal pain for 4 days. Past Medical history is significant for lung cancer.  EXAM: CT ABDOMEN AND PELVIS WITH CONTRAST  TECHNIQUE: Multidetector CT imaging of the abdomen and pelvis  was performed using the standard protocol following bolus administration of intravenous contrast.  CONTRAST:  136mL OMNIPAQUE IOHEXOL 300 MG/ML  SOLN  COMPARISON:  Lumbar spine MRI 01/10/2014; prior PET-CT 03/15/2013; prior CT abdomen/ pelvis with contrast 03/08/2013  FINDINGS: Lower Chest: Respiratory motion slightly limits evaluation of the lung bases. However, there is a combination of atelectasis and consolidation in the posteromedial right lower lobe superimposed on a background of emphysema. More mild atelectasis and chronic scarring present in the visualized left lower lobe. Stable 5 mm nodule in the right middle lobe (image 7 series 3). Visualized cardiac structures remain within normal limits for size. No pericardial effusion. Calcification of the aortic valve noted incidentally. Unremarkable visualized distal thoracic aorta.  Abdomen: Punctate calcification within the wall of the gastric fundus remains unchanged dating back to March of 2014 and is likely not clinically significant. Unremarkable appearance of the duodenum, spleen, adrenal glands, and pancreas.  Continued stability of indeterminate hypo attenuating lesions throughout the right hepatic lobe dating back to March 08, 2013. These are not hypermetabolic on the prior PET-CT. Numerous gas containing stones layer within the gallbladder lumen. No secondary signs to suggest acute cholecystitis. No intra or extrahepatic biliary ductal dilatation.  No enhancing renal lesion, nephrolithiasis or hydronephrosis. No significant interval change in a multiple low-attenuation cystic lesions throughout both kidneys. Many of the lesions 2 small for accurate characterization but remain unchanged in size, number and configuration. The largest lesion in the lower pole the left kidney remains consistent with a simple cyst.  No evidence of obstruction or focal bowel wall thickening. Normal appendix in the right lower quadrant. The terminal ileum is unremarkable. No  suspicious adenopathy or free  fluid.  Pelvis: Marked prostatomegaly. The prostate gland measures 6.2 x 6.7 x approximately 5.7 cm yielding an estimated volume of 118 cc.  Bones/Soft Tissues: No acute fracture or aggressive appearing lytic or blastic osseous lesion.  Vascular: Atherosclerotic vascular disease without significant stenosis or aneurysmal dilatation.  IMPRESSION: 1. No acute abnormality to explain the patient's clinical symptoms. 2. Cholelithiasis. 3. Marked prostatomegaly.  The estimated prostatic volume is 118 cc. 4. Stable indeterminate hepatic lesions dating back to 03/08/2013. These lesions were not hypermetabolic on the prior PET-CT. 5. Stable right middle lobe 5 mm pulmonary nodule. 6. Additional ancillary findings as above without significant interval change.   Electronically Signed   By: Jacqulynn Cadet M.D.   On: 06/22/2014 11:16   Dg Chest Portable 1 View  07/13/2014   CLINICAL DATA:  Code stroke.  EXAM: PORTABLE CHEST - 1 VIEW  COMPARISON:  Chest radiograph June 22, 2014, CT of the chest February 22, 2014  FINDINGS: Similar cardiomegaly. Mediastinal silhouette is nonsuspicious. Strandy density in left midlung zone, improved from prior examination. Stable COPD. Left lung base atelectasis versus scarring. No new consolidation. No pleural effusions. No pneumothorax.  Soft tissue planes and included osseous structures are nonsuspicious.  IMPRESSION: Improved aeration of left midlung zone, stable COPD. Stable cardiomegaly, no acute pulmonary process.   Electronically Signed   By: Elon Alas   On: 07/13/2014 15:01      IMPRESSION: This patient is a very nice 58 yo man with 2 right temporal brain metastases from non-small cell lung cancer.  He may benefit from resection of these lesions due to mass effect.  Following resection, he would be at increased risk of intracranial recurrence which can be reduced with radiotherpy.  Evidence suggests that stereotactic radiosurgery in this setting  can minimize the risk of neurocognitive toxicity over whole brain irradiation.  In terms of timing, pre-op SRS offers better target delineation and reduces the risk of both geometric miss and radionecrosis.  PLAN:Today, I talked to the patient and family about the findings and work-up thus far.  We discussed the natural history of large brain metastases and general treatment, highlighting the role or stereotactic radiosurgery in the management.  We discussed the available radiation techniques, and focused on the details of logistics and delivery.  We reviewed the anticipated acute and late sequelae associated with radiation in this setting.  The patient was encouraged to ask questions that I answered to the best of my ability.  The patient would like to proceed with radiation and will be scheduled for CT simulation.  I spent 60 minutes minutes face to face with the patient and more than 50% of that time was spent in counseling and/or coordination of care.  ------------------------------------------------  Sheral Apley. Tammi Klippel, M.D.

## 2014-07-14 NOTE — Evaluation (Signed)
Physical Therapy One Time Evaluation Patient Details Name: Rodney Powers MRN: [id number] DOB: 1956-01-06 Today's Date: 07/14/2014   History of Present Illness  Pt is a 58 year old male admitted from home due to syncope episodes and found to have multiple brain metastasis on MRI with hx of lung cancer s/p treatment and dysphagia.  Clinical Impression  Patient evaluated by Physical Therapy with no further acute PT needs identified. All education has been completed and the patient has no further questions. Pt reports L weakness in extremities has resolved and no deficits observed.  Pt able to don socks at bedside without difficulty, no balance deficits observed, and extremities tested (MMT) equal in strength at this time.  No current follow-up Physial Therapy or equipment needs at this time. PT is signing off. Thank you for this referral.    Follow Up Recommendations No PT follow up    Equipment Recommendations  None recommended by PT    Recommendations for Other Services       Precautions / Restrictions Precautions Precautions: Fall      Mobility  Bed Mobility Overal bed mobility: Modified Independent                Transfers Overall transfer level: Modified independent                  Ambulation/Gait Ambulation/Gait assistance: Supervision;Modified independent (Device/Increase time) Ambulation Distance (Feet): 400 Feet Assistive device: None Gait Pattern/deviations: WFL(Within Functional Limits)     General Gait Details: no unsteadiness or LOB observed  Stairs            Wheelchair Mobility    Modified Rankin (Stroke Patients Only)       Balance Overall balance assessment:  (no hx of falls, denies balance issues)                                           Pertinent Vitals/Pain No pain    Home Living Family/patient expects to be discharged to:: Private residence Living Arrangements: Children   Type of Home: House        Home Layout: One level Home Equipment: None      Prior Function Level of Independence: Independent               Hand Dominance        Extremity/Trunk Assessment   Upper Extremity Assessment: Overall WFL for tasks assessed           Lower Extremity Assessment: Overall WFL for tasks assessed (denies numbess/tinging, no difference in strength b/w extremities)      Cervical / Trunk Assessment: Normal  Communication   Communication: No difficulties  Cognition Arousal/Alertness: Awake/alert Behavior During Therapy: WFL for tasks assessed/performed Overall Cognitive Status: Within Functional Limits for tasks assessed                      General Comments      Exercises        Assessment/Plan    PT Assessment Patent does not need any further PT services  PT Diagnosis     PT Problem List    PT Treatment Interventions     PT Goals (Current goals can be found in the Care Plan section) Acute Rehab PT Goals PT Goal Formulation: No goals set, d/c therapy    Frequency     Barriers  to discharge        Co-evaluation               End of Session   Activity Tolerance: Patient tolerated treatment well Patient left: in chair;with call bell/phone within reach           Time: 0919-0930 PT Time Calculation (min): 11 min   Charges:   PT Evaluation $Initial PT Evaluation Tier I: 1 Procedure PT Treatments $Gait Training: 8-22 mins   PT G Codes:          Chosen Geske,KATHrine E 07/14/2014, 10:27 AM Carmelia Bake, PT, DPT 07/14/2014 Pager: [phone]

## 2014-07-14 NOTE — Progress Notes (Signed)
  Radiation Oncology         [phone] ________________________________  Name: Rodney Powers MRN: [id number]  Date: 07/13/2014  DOB: Apr 20, 1956  Chart Note:  I reviewed this patient's most recent notes.  I appreciate Dr. Cleotilde Neer input and agree that this patient may be a good candidate for pre-op SRS followed by resection of his isolated right temporal brain metastases.  We will work with Dr. Kathyrn Sheriff regarding the timing of planning and treatment.  ________________________________  Sheral Apley. Tammi Klippel, M.D.

## 2014-07-15 ENCOUNTER — Other Ambulatory Visit: Payer: Self-pay | Admitting: Hematology and Oncology

## 2014-07-15 LAB — BASIC METABOLIC PANEL
Anion gap: 15 (ref 5–15)
BUN: 20 mg/dL (ref 6–23)
CALCIUM: 9.9 mg/dL (ref 8.4–10.5)
CO2: 21 mEq/L (ref 19–32)
Chloride: 103 mEq/L (ref 96–112)
Creatinine, Ser: 1.11 mg/dL (ref 0.50–1.35)
GFR calc Af Amer: 84 mL/min — ABNORMAL LOW (ref >90)
GFR, EST NON AFRICAN AMERICAN: 72 mL/min — AB (ref >90)
Glucose, Bld: 200 mg/dL — ABNORMAL HIGH (ref 70–99)
Potassium: 4.4 mEq/L (ref 3.7–5.3)
Sodium: 139 mEq/L (ref 137–147)

## 2014-07-15 LAB — GLUCOSE, CAPILLARY: GLUCOSE-CAPILLARY: 188 mg/dL — AB (ref 70–99)

## 2014-07-15 MED ORDER — THIAMINE HCL 100 MG PO TABS: 100.0000 mg | ORAL_TABLET | Freq: Every day | ORAL | Status: DC

## 2014-07-15 MED ORDER — DEXAMETHASONE 4 MG PO TABS: 4.0000 mg | ORAL_TABLET | Freq: Four times a day (QID) | ORAL | Status: DC

## 2014-07-15 MED ORDER — LEVETIRACETAM 500 MG PO TABS: 500.0000 mg | ORAL_TABLET | Freq: Two times a day (BID) | ORAL | Status: DC

## 2014-07-15 MED ORDER — NICOTINE 14 MG/24HR TD PT24
14.0000 mg | MEDICATED_PATCH | Freq: Every day | TRANSDERMAL | Status: DC
Start: 2014-07-15 — End: 2014-07-19

## 2014-07-15 MED ORDER — HYDROCODONE-HOMATROPINE 5-1.5 MG/5ML PO SYRP: 5.0000 mL | ORAL_SOLUTION | Freq: Four times a day (QID) | ORAL | Status: DC | PRN

## 2014-07-15 MED ORDER — HYDROCODONE-ACETAMINOPHEN 5-325 MG PO TABS: 1.0000 | ORAL_TABLET | Freq: Four times a day (QID) | ORAL | Status: DC | PRN

## 2014-07-15 MED ORDER — DSS 100 MG PO CAPS: 100.0000 mg | ORAL_CAPSULE | Freq: Two times a day (BID) | ORAL | Status: DC

## 2014-07-15 NOTE — Discharge Summary (Signed)
Physician Discharge Summary  Rodney Powers MRN: [id number] DOB/AGE: 1956/05/11 58 y.o.  PCP: Lorayne Marek, MD   Admit date: 07/13/2014 Discharge date: 07/15/2014  Discharge Diagnoses:      Brain metastases Active Problems:   ETOH abuse   Lung cancer   Type II or unspecified type diabetes mellitus without mention of complication, not stated as uncontrolled   Tobacco abuse   Brain mass  Follow recommendations SRS treatment Monday resection Wednesday by Dr.Matthew Frazier Richards, MD     Medication List         accu-chek multiclix lancets  Use as instructed     dexamethasone 4 MG tablet  Commonly known as:  DECADRON  Take 1 tablet (4 mg total) by mouth 4 (four) times daily.     DSS 100 MG Caps  Take 100 mg by mouth 2 (two) times daily.     gabapentin 400 MG capsule  Commonly known as:  NEURONTIN  Take 800 mg by mouth 3 (three) times daily.     glimepiride 4 MG tablet  Commonly known as:  AMARYL  TAKE 1 TABLET BY MOUTH ONCE DAILY BEFORE BREAKFAST     HYDROcodone-acetaminophen 5-325 MG per tablet  Commonly known as:  NORCO/VICODIN  Take 1-2 tablets by mouth every 6 (six) hours as needed for moderate pain.     HYDROcodone-homatropine 5-1.5 MG/5ML syrup  Commonly known as:  HYCODAN  Take 5 mLs by mouth every 6 (six) hours as needed for cough.     hydrocortisone 2.5 % cream  Apply topically 2 (two) times daily.     levETIRAcetam 500 MG tablet  Commonly known as:  KEPPRA  Take 1 tablet (500 mg total) by mouth 2 (two) times daily.     nicotine 14 mg/24hr patch  Commonly known as:  NICODERM CQ - dosed in mg/24 hours  Place 1 patch (14 mg total) onto the skin daily.     ramipril 2.5 MG capsule  Commonly known as:  ALTACE  Take 1 capsule (2.5 mg total) by mouth daily.     simvastatin 20 MG tablet  Commonly known as:  ZOCOR  Take 1 tablet (20 mg total) by mouth at bedtime.     thiamine 100 MG tablet  Take 1 tablet (100 mg total) by mouth daily.         Discharge Condition: Stable   Disposition: 01-Home or Self Care   Consults:  Radiation oncology  Significant Diagnostic Studies: Dg Chest 2 View  06/22/2014   CLINICAL DATA:  Chest pain and shortness of breath.  EXAM: CHEST  2 VIEW  COMPARISON:  PA and lateral chest 10/13/2011. Single view of the chest 03/25/2013. CT chest 02/22/2014.  FINDINGS: Bullous emphysematous disease is again seen. Perihilar scarring on the left consistent with prior radiation therapy is again identified. There is no new airspace disease or nodule. No pneumothorax or pleural effusion is identified.  IMPRESSION: No acute abnormality.  Post treatment change left upper lobe.  Emphysema.   Electronically Signed   By: Inge Rise M.D.   On: 06/22/2014 09:10   Ct Head Wo Contrast  07/13/2014   CLINICAL DATA:  Code stroke.  Lung cancer.  EXAM: CT HEAD WITHOUT CONTRAST  TECHNIQUE: Contiguous axial images were obtained from the base of the skull through the vertex without intravenous contrast.  COMPARISON:  MRI 04/01/2013.  PET-CT 03/15/2013, CT 10/13/2011.  FINDINGS: Hyperdense approximately 3 cm possible mass in the right temporal lobe. Severe adjacent  edema is noted. Midline shift of the third and right lateral ventricle to the left of approximately 7 mm. Ill-defined possible 1.3 cm mass right temporal lobe tip. Questionable mass noted along the left portion of the basilar cisterns, image number 7/series 2. These findings together suggest possible metastatic disease. Subtle amount of petechial hemorrhage within the masses cannot be excluded. Associated stroke cannot be excluded. No hydrocephalus. No acute bony abnormality. Right frontotemporal subcutaneous lipoma.  IMPRESSION: Findings worrisome for metastatic disease in this patient with known lung cancer. Prominent edema in the right temporoparietal region results in mass effect with midline shift from right to left of 7 mm. A component of mild hemorrhage within masses  cannot be excluded. Gadolinium-enhanced MRI of the brain should be considered for further evaluation. These results were called by telephone at the time of interpretation on 07/13/2014 at 2:30 pm to Dr. Doy Mince, who verbally acknowledged these results.   Electronically Signed   By: Marcello Moores  Register   On: 07/13/2014 14:34   Mr Jeri Cos Wo Contrast  07/13/2014   CLINICAL DATA:  Lung cancer. Syncope. Evaluate for metastatic disease.  EXAM: MRI HEAD WITHOUT AND WITH CONTRAST  TECHNIQUE: Multiplanar, multiecho pulse sequences of the brain and surrounding structures were obtained without and with intravenous contrast.  CONTRAST:  80m MULTIHANCE GADOBENATE DIMEGLUMINE 529 MG/ML IV SOLN  COMPARISON:  Head CT 07/13/2014 and MRI 04/01/2013  FINDINGS: Right frontotemporal scalp lipoma is again seen. Sebaceous cyst in the scalp at the skull vertex is unchanged. Mild heterogeneity of the bone marrow signal at the skullbase and upper cervical spine is again noted and similar to prior.  There is no acute infarct or extra-axial fluid collection. New enhancing lesions measure 1.7 x 1.4 cm in the anterior right temporal lobe (series 12, image 18), and 2.7 x 1.8 cm in the posterior right temporal lobe (series 12, image 27). There is extensive surrounding vasogenic edema throughout the right temporal lobe extending into the parietal and posterior frontal lobes. There is mass effect on the right lateral ventricle with 10 mm of leftward midline shift. There is mild cerebral atrophy.  Orbits are unremarkable. Mild bilateral ethmoid air cell mucosal thickening and small left maxillary sinus mucous retention cysts are noted. Mastoid air cells are clear. Major intracranial vascular flow voids are preserved.  IMPRESSION: Two new enhancing right temporal lobe lesions, consistent with metastases. Extensive vasogenic edema results an 10 mm midline shift.   Electronically Signed   By: ALogan Bores  On: 07/13/2014 17:38   Ct Abdomen Pelvis W  Contrast  06/22/2014   CLINICAL DATA:  Right-sided mid abdominal pain for 4 days. Past Medical history is significant for lung cancer.  EXAM: CT ABDOMEN AND PELVIS WITH CONTRAST  TECHNIQUE: Multidetector CT imaging of the abdomen and pelvis was performed using the standard protocol following bolus administration of intravenous contrast.  CONTRAST:  1073mOMNIPAQUE IOHEXOL 300 MG/ML  SOLN  COMPARISON:  Lumbar spine MRI 01/10/2014; prior PET-CT 03/15/2013; prior CT abdomen/ pelvis with contrast 03/08/2013  FINDINGS: Lower Chest: Respiratory motion slightly limits evaluation of the lung bases. However, there is a combination of atelectasis and consolidation in the posteromedial right lower lobe superimposed on a background of emphysema. More mild atelectasis and chronic scarring present in the visualized left lower lobe. Stable 5 mm nodule in the right middle lobe (image 7 series 3). Visualized cardiac structures remain within normal limits for size. No pericardial effusion. Calcification of the aortic valve noted incidentally. Unremarkable  visualized distal thoracic aorta.  Abdomen: Punctate calcification within the wall of the gastric fundus remains unchanged dating back to March of 2014 and is likely not clinically significant. Unremarkable appearance of the duodenum, spleen, adrenal glands, and pancreas.  Continued stability of indeterminate hypo attenuating lesions throughout the right hepatic lobe dating back to March 08, 2013. These are not hypermetabolic on the prior PET-CT. Numerous gas containing stones layer within the gallbladder lumen. No secondary signs to suggest acute cholecystitis. No intra or extrahepatic biliary ductal dilatation.  No enhancing renal lesion, nephrolithiasis or hydronephrosis. No significant interval change in a multiple low-attenuation cystic lesions throughout both kidneys. Many of the lesions 2 small for accurate characterization but remain unchanged in size, number and  configuration. The largest lesion in the lower pole the left kidney remains consistent with a simple cyst.  No evidence of obstruction or focal bowel wall thickening. Normal appendix in the right lower quadrant. The terminal ileum is unremarkable. No suspicious adenopathy or free fluid.  Pelvis: Marked prostatomegaly. The prostate gland measures 6.2 x 6.7 x approximately 5.7 cm yielding an estimated volume of 118 cc.  Bones/Soft Tissues: No acute fracture or aggressive appearing lytic or blastic osseous lesion.  Vascular: Atherosclerotic vascular disease without significant stenosis or aneurysmal dilatation.  IMPRESSION: 1. No acute abnormality to explain the patient's clinical symptoms. 2. Cholelithiasis. 3. Marked prostatomegaly.  The estimated prostatic volume is 118 cc. 4. Stable indeterminate hepatic lesions dating back to 03/08/2013. These lesions were not hypermetabolic on the prior PET-CT. 5. Stable right middle lobe 5 mm pulmonary nodule. 6. Additional ancillary findings as above without significant interval change.   Electronically Signed   By: Jacqulynn Cadet M.D.   On: 06/22/2014 11:16   Dg Chest Portable 1 View  07/13/2014   CLINICAL DATA:  Code stroke.  EXAM: PORTABLE CHEST - 1 VIEW  COMPARISON:  Chest radiograph June 22, 2014, CT of the chest February 22, 2014  FINDINGS: Similar cardiomegaly. Mediastinal silhouette is nonsuspicious. Strandy density in left midlung zone, improved from prior examination. Stable COPD. Left lung base atelectasis versus scarring. No new consolidation. No pleural effusions. No pneumothorax.  Soft tissue planes and included osseous structures are nonsuspicious.  IMPRESSION: Improved aeration of left midlung zone, stable COPD. Stable cardiomegaly, no acute pulmonary process.   Electronically Signed   By: Elon Alas   On: 07/13/2014 15:01   US Abdomen Limited Ruq  07/14/2014   CLINICAL DATA:  Right upper quadrant and back pain  EXAM: US ABDOMEN LIMITED - RIGHT UPPER  QUADRANT  COMPARISON:  Prior abdominal ultrasound 03/08/2013  FINDINGS: Gallbladder:  Calcified shadowing gallstones within the gallbladder lumen. No gallbladder wall thickening or pericholecystic fluid. Per the sonographer, sonographic Percell Miller sign was negative.  Common bile duct:  Diameter: Within normal limits at 4.2 mm.  Liver:  Numerous circumscribed hyperechoic lesions throughout the liver are similar compared to multiple prior studies including abdominal ultrasound 03/08/2013. Stability suggests these are benign hemangiomas.  IMPRESSION: 1. Cholelithiasis without sonographic features to suggest acute cholecystitis. 2. Stable appearance of the numerous hyperechoic hepatic lesions. Stability suggests that these represent benign hemangiomas.   Electronically Signed   By: Jacqulynn Cadet M.D.   On: 07/14/2014 18:08       Microbiology: No results found for this or any previous visit (from the past 240 hour(s)).   Labs: Results for orders placed during the hospital encounter of 07/13/14 (from the past 48 hour(s))  ETHANOL     Status:  Abnormal   Collection Time    07/13/14  2:00 PM      Result Value Ref Range   Alcohol, Ethyl (B) 41 (*) 0 - 11 mg/dL   Comment:            LOWEST DETECTABLE LIMIT FOR     SERUM ALCOHOL IS 11 mg/dL     FOR MEDICAL PURPOSES ONLY  PROTIME-INR     Status: None   Collection Time    07/13/14  2:00 PM      Result Value Ref Range   Prothrombin Time 13.6  11.6 - 15.2 seconds   INR 1.04  0.00 - 1.49  APTT     Status: None   Collection Time    07/13/14  2:00 PM      Result Value Ref Range   aPTT 28  24 - 37 seconds  CBC     Status: Abnormal   Collection Time    07/13/14  2:00 PM      Result Value Ref Range   WBC 4.2  4.0 - 10.5 K/uL   RBC 5.30  4.22 - 5.81 MIL/uL   Hemoglobin 14.6  13.0 - 17.0 g/dL   HCT 43.9  39.0 - 52.0 %   MCV 82.8  78.0 - 100.0 fL   MCH 27.5  26.0 - 34.0 pg   MCHC 33.3  30.0 - 36.0 g/dL   RDW 15.9 (*) 11.5 - 15.5 %   Platelets 171   150 - 400 K/uL  DIFFERENTIAL     Status: None   Collection Time    07/13/14  2:00 PM      Result Value Ref Range   Neutrophils Relative % 67  43 - 77 %   Neutro Abs 2.9  1.7 - 7.7 K/uL   Lymphocytes Relative 23  12 - 46 %   Lymphs Abs 1.0  0.7 - 4.0 K/uL   Monocytes Relative 7  3 - 12 %   Monocytes Absolute 0.3  0.1 - 1.0 K/uL   Eosinophils Relative 2  0 - 5 %   Eosinophils Absolute 0.1  0.0 - 0.7 K/uL   Basophils Relative 1  0 - 1 %   Basophils Absolute 0.0  0.0 - 0.1 K/uL  COMPREHENSIVE METABOLIC PANEL     Status: Abnormal   Collection Time    07/13/14  2:00 PM      Result Value Ref Range   Sodium 141  137 - 147 mEq/L   Potassium 3.8  3.7 - 5.3 mEq/L   Chloride 104  96 - 112 mEq/L   CO2 19  19 - 32 mEq/L   Glucose, Bld 103 (*) 70 - 99 mg/dL   BUN 8  6 - 23 mg/dL   Creatinine, Ser 1.13  0.50 - 1.35 mg/dL   Calcium 9.1  8.4 - 10.5 mg/dL   Total Protein 6.9  6.0 - 8.3 g/dL   Albumin 3.5  3.5 - 5.2 g/dL   AST 17  0 - 37 U/L   ALT 16  0 - 53 U/L   Alkaline Phosphatase 77  39 - 117 U/L   Total Bilirubin 0.3  0.3 - 1.2 mg/dL   GFR calc non Af Amer 71 (*) >90 mL/min   GFR calc Af Amer 82 (*) >90 mL/min   Comment: (NOTE)     The eGFR has been calculated using the CKD EPI equation.     This calculation has not been validated in  all clinical situations.     eGFR's persistently <90 mL/min signify possible Chronic Kidney     Disease.   Anion gap 18 (*) 5 - 15  I-STAT TROPOININ, ED     Status: None   Collection Time    07/13/14  2:05 PM      Result Value Ref Range   Troponin i, poc 0.00  0.00 - 0.08 ng/mL   Comment 3            Comment: Due to the release kinetics of cTnI,     a negative result within the first hours     of the onset of symptoms does not rule out     myocardial infarction with certainty.     If myocardial infarction is still suspected,     repeat the test at appropriate intervals.  I-STAT CHEM 8, ED     Status: Abnormal   Collection Time    07/13/14  2:07  PM      Result Value Ref Range   Sodium 144  137 - 147 mEq/L   Potassium 3.7  3.7 - 5.3 mEq/L   Chloride 105  96 - 112 mEq/L   BUN 7  6 - 23 mg/dL   Creatinine, Ser 1.20  0.50 - 1.35 mg/dL   Glucose, Bld 106 (*) 70 - 99 mg/dL   Calcium, Ion 1.19  1.12 - 1.23 mmol/L   TCO2 20  0 - 100 mmol/L   Hemoglobin 16.7  13.0 - 17.0 g/dL   HCT 49.0  39.0 - 52.0 %  URINE RAPID DRUG SCREEN (HOSP PERFORMED)     Status: None   Collection Time    07/13/14  3:05 PM      Result Value Ref Range   Opiates NONE DETECTED  NONE DETECTED   Cocaine NONE DETECTED  NONE DETECTED   Benzodiazepines NONE DETECTED  NONE DETECTED   Amphetamines NONE DETECTED  NONE DETECTED   Tetrahydrocannabinol NONE DETECTED  NONE DETECTED   Barbiturates NONE DETECTED  NONE DETECTED   Comment:            DRUG SCREEN FOR MEDICAL PURPOSES     ONLY.  IF CONFIRMATION IS NEEDED     FOR ANY PURPOSE, NOTIFY LAB     WITHIN 5 DAYS.                LOWEST DETECTABLE LIMITS     FOR URINE DRUG SCREEN     Drug Class       Cutoff (ng/mL)     Amphetamine      1000     Barbiturate      200     Benzodiazepine   124     Tricyclics       580     Opiates          300     Cocaine          300     THC              50  URINALYSIS, ROUTINE W REFLEX MICROSCOPIC     Status: None   Collection Time    07/13/14  3:05 PM      Result Value Ref Range   Color, Urine YELLOW  YELLOW   APPearance CLEAR  CLEAR   Specific Gravity, Urine 1.010  1.005 - 1.030   pH 5.5  5.0 - 8.0   Glucose, UA NEGATIVE  NEGATIVE mg/dL  Hgb urine dipstick NEGATIVE  NEGATIVE   Bilirubin Urine NEGATIVE  NEGATIVE   Ketones, ur NEGATIVE  NEGATIVE mg/dL   Protein, ur NEGATIVE  NEGATIVE mg/dL   Urobilinogen, UA 0.2  0.0 - 1.0 mg/dL   Nitrite NEGATIVE  NEGATIVE   Leukocytes, UA NEGATIVE  NEGATIVE   Comment: MICROSCOPIC NOT DONE ON URINES WITH NEGATIVE PROTEIN, BLOOD, LEUKOCYTES, NITRITE, OR GLUCOSE <1000 mg/dL.  GLUCOSE, CAPILLARY     Status: Abnormal   Collection Time     07/13/14 11:29 PM      Result Value Ref Range   Glucose-Capillary 207 (*) 70 - 99 mg/dL   Comment 1 Documented in Chart     Comment 2 Notify RN    COMPREHENSIVE METABOLIC PANEL     Status: Abnormal   Collection Time    07/14/14  4:14 AM      Result Value Ref Range   Sodium 134 (*) 137 - 147 mEq/L   Potassium 4.4  3.7 - 5.3 mEq/L   Chloride 99  96 - 112 mEq/L   CO2 21  19 - 32 mEq/L   Glucose, Bld 213 (*) 70 - 99 mg/dL   BUN 10  6 - 23 mg/dL   Creatinine, Ser 1.04  0.50 - 1.35 mg/dL   Calcium 9.7  8.4 - 10.5 mg/dL   Total Protein 7.3  6.0 - 8.3 g/dL   Albumin 3.5  3.5 - 5.2 g/dL   AST 15  0 - 37 U/L   ALT 15  0 - 53 U/L   Alkaline Phosphatase 84  39 - 117 U/L   Total Bilirubin 0.3  0.3 - 1.2 mg/dL   GFR calc non Af Amer 78 (*) >90 mL/min   GFR calc Af Amer >90  >90 mL/min   Comment: (NOTE)     The eGFR has been calculated using the CKD EPI equation.     This calculation has not been validated in all clinical situations.     eGFR's persistently <90 mL/min signify possible Chronic Kidney     Disease.   Anion gap 14  5 - 15  CBC     Status: Abnormal   Collection Time    07/14/14  4:14 AM      Result Value Ref Range   WBC 6.5  4.0 - 10.5 K/uL   RBC 5.72  4.22 - 5.81 MIL/uL   Hemoglobin 15.3  13.0 - 17.0 g/dL   HCT 47.6  39.0 - 52.0 %   MCV 83.2  78.0 - 100.0 fL   MCH 26.7  26.0 - 34.0 pg   MCHC 32.1  30.0 - 36.0 g/dL   RDW 16.0 (*) 11.5 - 15.5 %   Platelets 184  150 - 400 K/uL  HEMOGLOBIN A1C     Status: Abnormal   Collection Time    07/14/14  4:14 AM      Result Value Ref Range   Hemoglobin A1C 6.8 (*) <5.7 %   Comment: (NOTE)                                                                               According to the ADA Clinical Practice Recommendations  for 2011, when     HbA1c is used as a screening test:      >=6.5%   Diagnostic of Diabetes Mellitus               (if abnormal result is confirmed)     5.7-6.4%   Increased risk of developing Diabetes Mellitus      References:Diagnosis and Classification of Diabetes Mellitus,Diabetes     QHUT,6546,50(PTWSF 1):S62-S69 and Standards of Medical Care in             Diabetes - 2011,Diabetes KCLE,7517,00 (Suppl 1):S11-S61.   Mean Plasma Glucose 148 (*) <117 mg/dL   Comment: Performed at [facility], [facility]     Status: Abnormal   Collection Time    07/14/14  7:30 AM      Result Value Ref Range   Glucose-Capillary 217 (*) 70 - 99 mg/dL   Comment 1 Documented in Chart     Comment 2 Notify RN    GLUCOSE, CAPILLARY     Status: Abnormal   Collection Time    07/14/14 12:39 PM      Result Value Ref Range   Glucose-Capillary 124 (*) 70 - 99 mg/dL  GLUCOSE, CAPILLARY     Status: Abnormal   Collection Time    07/14/14  5:56 PM      Result Value Ref Range   Glucose-Capillary 166 (*) 70 - 99 mg/dL  GLUCOSE, CAPILLARY     Status: Abnormal   Collection Time    07/14/14 10:08 PM      Result Value Ref Range   Glucose-Capillary 308 (*) 70 - 99 mg/dL  BASIC METABOLIC PANEL     Status: Abnormal   Collection Time    07/15/14  7:06 AM      Result Value Ref Range   Sodium 139  137 - 147 mEq/L   Potassium 4.4  3.7 - 5.3 mEq/L   Chloride 103  96 - 112 mEq/L   CO2 21  19 - 32 mEq/L   Glucose, Bld 200 (*) 70 - 99 mg/dL   BUN 20  6 - 23 mg/dL   Creatinine, Ser 1.11  0.50 - 1.35 mg/dL   Calcium 9.9  8.4 - 10.5 mg/dL   GFR calc non Af Amer 72 (*) >90 mL/min   GFR calc Af Amer 84 (*) >90 mL/min   Comment: (NOTE)     The eGFR has been calculated using the CKD EPI equation.     This calculation has not been validated in all clinical situations.     eGFR's persistently <90 mL/min signify possible Chronic Kidney     Disease.   Anion gap 15  5 - 15  GLUCOSE, CAPILLARY     Status: Abnormal   Collection Time    07/15/14  7:24 AM      Result Value Ref Range   Glucose-Capillary 188 (*) 70 - 99 mg/dL     HPI : 58 y.o. 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 His follow-up imaging studies including chest CT through March 2015 showed no evidence of recurrence. Last evening the patient presented with left hemiparesis and CT and subsequent brain MRI showed 2 isolated right temporal brain mets (2.7 cm post, 1.7 cm ant). Dr. Kathyrn Sheriff evaluated the patient and felt that resection of both may be indicated given the degree of mass effect. In order to  prevent recurrence, radiation oncology asked to evaluate the patient for pre-op [place]:  #1 Syncope and left-sided weakness secondary 2 right temporal brain metastases from non-small cell lung cancer - stage IV -Patient was initially admitted Discussed with his oncologist/Dr. Alvy Bimler who recommended Decadron and transfer to [facility] for consultation with radiation oncology.  -MRI Brain with 2 new enhancing right temporal lobe lesions consistent with metastasis, extensive vasogenic edema resulting in 10 mm min line shift  -Radiation oncology consulted, had simulation prior to discharge  patient will receive 14 Gy in 1 fraction on Monday at 3 pm followed by surgical resection on Wednesday morning -He has not had any further syncopal episodes since admission and left-sided weakness has resolved, follow  -Continue dose dexamethasone , Keppra for seizure prophylaxis  EEG negative    #2 history of lung cancer: He has finished chemotherapy and radiation treatment as of August of 2014. His oncologist, to address this further.  -Patient to followup with Dr. Alvy Bimler for outpatient PET scan as per her 7/31 consult note and further recommendations/management thereafter.   #3 diabetes mellitus, type II: Check HbA1c is 6.8,resume glimeperide   #4 history of Tobacco abuse: Nicotine patch will be prescribed.   #5 history of alcohol abuse: But not excessive intake. Given thiamine. Monitor him closely for signs and symptoms of withdrawal and for  that she is appropriate  -He has no evidence of withdrawals at this time.   #6 history of COPD:  -Stable, Nebulizer treatments as needed.  - Chest x-ray does not show any new findings.    #7 nonspecific abdominal pain: He has had this for a few weeks and abdominal CT which showed predominantly chronic findings. Cholelithiasis, however, was also noted. Symptoms are mainly on the right side.  -LFTs normal.  -Hepatic lesions are noted to be present since 2014.  Right upper quadrant ultrasound shows cholelithiasis without any features of acute cholecystitis    Consultants:  Radiation Oncology  [facility]eme oncology      Discharge Exam:  Blood pressure 133/82, pulse 95, temperature 97.8 F (36.6 C), temperature source Oral, resp. rate 18, height 6' 3"  (1.905 m), weight 105.5 kg (232 lb 9.4 oz), SpO2 95.00%.  NEUROLOGIC EXAM:  Awake, alert, oriented  Memory and concentration grossly intact  Speech fluent, appropriate  CN grossly intact         Signed: Jodine Muchmore 07/15/2014, 10:05 AM

## 2014-07-17 ENCOUNTER — Encounter: Payer: Self-pay | Admitting: Radiation Oncology

## 2014-07-17 ENCOUNTER — Other Ambulatory Visit: Payer: Self-pay | Admitting: Radiation Therapy

## 2014-07-17 ENCOUNTER — Ambulatory Visit
Admission: RE | Admit: 2014-07-17 | Discharge: 2014-07-17 | Disposition: A | Discharge status: Home / Self Care | Payer: Medicaid Other | Source: Ambulatory Visit | Attending: Radiation Oncology | Admitting: Radiation Oncology

## 2014-07-17 VITALS — BP 136/67 | HR 58 | Temp 97.6°F | Resp 16

## 2014-07-17 DIAGNOSIS — C7931 Secondary malignant neoplasm of brain: Secondary | ICD-10-CM

## 2014-07-17 DIAGNOSIS — C7949 Secondary malignant neoplasm of other parts of nervous system: Principal | ICD-10-CM

## 2014-07-17 DIAGNOSIS — C3490 Malignant neoplasm of unspecified part of unspecified bronchus or lung: Secondary | ICD-10-CM | POA: Diagnosis not present

## 2014-07-17 DIAGNOSIS — Z510 Encounter for antineoplastic radiation therapy: Secondary | ICD-10-CM | POA: Diagnosis present

## 2014-07-17 LAB — GLUCOSE, CAPILLARY: Glucose-Capillary: 181 mg/dL — ABNORMAL HIGH (ref 70–99)

## 2014-07-17 NOTE — Op Note (Signed)
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 Tyler Pita, MD  TECHNIQUE:  The patient underwent a radiation treatment planning session in the radiation oncology simulation suite under the care of the radiation oncology physician and physicist.  I participated closely in the radiation treatment planning afterwards. The patient underwent planning CT which was fused to MRI.  These images were fused on the planning system.  We contoured the gross target volumes and subsequently expanded this to yield the Planning Target Volume. I actively participated in the planning process.  I helped to define and review the target contours and also the contours of the optic pathway, eyes, brainstem and selected nearby organs at risk.  All the dose constraints for critical structures were reviewed and compared to AAPM Task Group 101.  The prescription dose conformity was reviewed.  I approved the plan electronically.    Accordingly, Rodney Powers  was brought to the TrueBeam stereotactic radiation treatment linac and placed in the custom immobilization mask.  The patient was aligned according to the IR fiducial markers with BrainLab Exactrac, then orthogonal x-rays were used in ExacTrac with the 6DOF robotic table and the shifts were made to align the patient  Rodney Powers received stereotactic radiosurgery to a prescription dose of 14Gy to the posterior temporal and 20Gy to the temporopolar lesion.    The detailed description of the procedure is recorded in the radiation oncology procedure note.  I was present for the duration of the procedure.  DISPOSITION:   Following delivery, the patient was transported to nursing in stable  condition and monitored for possible acute effects to be discharged to home in stable condition with follow-up in one month.  Consuella Lose, MD [facility]

## 2014-07-17 NOTE — Progress Notes (Signed)
  Radiation Oncology         [phone] ________________________________  Stereotactic Treatment Procedure Note  Name: Rodney Powers MRN: [id number]  Date: 07/17/2014  DOB: 1956-10-08  SPECIAL TREATMENT PROCEDURE  3D TREATMENT PLANNING AND DOSIMETRY:  The patient's radiation plan was reviewed and approved by Dr. Consuella Lose, MD from neurosurgery and radiation oncology prior to treatment.  It showed 3-dimensional radiation distributions overlaid onto the planning CT/MRI image set.  The [facility] for the target structures as well as the organs at risk were reviewed. The documentation of the 3D plan and dosimetry are filed in the radiation oncology EMR.  NARRATIVE:  Rodney Powers was brought to the TrueBeam stereotactic radiation treatment machine and placed supine on the CT couch. The head frame was applied, and the patient was set up for stereotactic radiosurgery.  Dr. Kathyrn Sheriff from neurosurgery was present for the set-up and delivery  SIMULATION VERIFICATION:  In the couch zero-angle position, the patient underwent Exactrac imaging using the Brainlab system with orthogonal KV images.  These were carefully aligned and repeated to confirm treatment position for each of the isocenters.  The Exactrac snap film verification was repeated at each couch angle.  SPECIAL TREATMENT PROCEDURE: [name] received stereotactic radiosurgery to the following targets: Right posterior temporal 2.7 cm target was treated using 4 Dynamic Conformal Arcs to a prescription dose of 14 Gy.  ExacTrac registration was performed for each couch angle.  The 84% isodose line was prescribed. Right anterior temporal 1.7 cm target was treated using 4 Dynamic Conformal Arcs to a prescription dose of 20 Gy.  ExacTrac registration was performed for each couch angle.  The 82.6% isodose line was prescribed.  STEREOTACTIC TREATMENT MANAGEMENT:  Following delivery, the patient was transported to nursing in stable  condition and monitored for possible acute effects.  Vital signs were recorded. The patient tolerated treatment without significant acute effects, and was discharged to home in stable condition.    PLAN: Planned right temporal craniotomy tomorrow with resection of both brain mets, then, follow-up with me in one month.  ________________________________  Sheral Apley. Tammi Klippel, M.D.

## 2014-07-17 NOTE — Progress Notes (Signed)
One hour nurse monitoring complete. Patient alert and oriented to person, place, and time. No distress noted. Denies headache, dizziness, nausea, diplopia or ringing in the ears. Patient without complaints. Vitals stable. Denies pain. Discharged home. Understands to rest and call with needs.

## 2014-07-18 ENCOUNTER — Ambulatory Visit
Admission: RE | Admit: 2014-07-18 | Discharge: 2014-07-18 | Disposition: A | Discharge status: Home / Self Care | Payer: Medicaid Other | Source: Ambulatory Visit | Attending: Radiation Oncology | Admitting: Radiation Oncology

## 2014-07-18 DIAGNOSIS — C7949 Secondary malignant neoplasm of other parts of nervous system: Principal | ICD-10-CM

## 2014-07-18 DIAGNOSIS — C7931 Secondary malignant neoplasm of brain: Secondary | ICD-10-CM

## 2014-07-18 MED ORDER — GADOBENATE DIMEGLUMINE 529 MG/ML IV SOLN
20.0000 mL | Freq: Once | INTRAVENOUS | Status: AC | PRN
  Administered 2014-07-18: 20 mL via INTRAVENOUS

## 2014-07-19 ENCOUNTER — Inpatient Hospital Stay (HOSPITAL_COMMUNITY): Payer: Medicaid Other | Admitting: Certified Registered"

## 2014-07-19 ENCOUNTER — Inpatient Hospital Stay (HOSPITAL_COMMUNITY)
Admission: AD | Admit: 2014-07-19 | Discharge: 2014-07-21 | DRG: 026 | Disposition: A | Discharge status: Home / Self Care | Payer: Medicaid Other | Source: Ambulatory Visit | Attending: Neurosurgery | Admitting: Neurosurgery

## 2014-07-19 ENCOUNTER — Encounter (HOSPITAL_COMMUNITY)
Admission: AD | Disposition: A | Discharge status: Home / Self Care | Payer: Self-pay | Source: Ambulatory Visit | Attending: Neurosurgery

## 2014-07-19 ENCOUNTER — Encounter (HOSPITAL_COMMUNITY): Payer: Self-pay | Admitting: *Deleted

## 2014-07-19 ENCOUNTER — Encounter (HOSPITAL_COMMUNITY): Payer: Medicaid Other | Admitting: Certified Registered"

## 2014-07-19 DIAGNOSIS — G8190 Hemiplegia, unspecified affecting unspecified side: Secondary | ICD-10-CM | POA: Diagnosis present

## 2014-07-19 DIAGNOSIS — E1142 Type 2 diabetes mellitus with diabetic polyneuropathy: Secondary | ICD-10-CM | POA: Diagnosis present

## 2014-07-19 DIAGNOSIS — F1010 Alcohol abuse, uncomplicated: Secondary | ICD-10-CM | POA: Diagnosis present

## 2014-07-19 DIAGNOSIS — J438 Other emphysema: Secondary | ICD-10-CM | POA: Diagnosis present

## 2014-07-19 DIAGNOSIS — M549 Dorsalgia, unspecified: Secondary | ICD-10-CM | POA: Diagnosis present

## 2014-07-19 DIAGNOSIS — E669 Obesity, unspecified: Secondary | ICD-10-CM | POA: Diagnosis present

## 2014-07-19 DIAGNOSIS — Z923 Personal history of irradiation: Secondary | ICD-10-CM | POA: Diagnosis not present

## 2014-07-19 DIAGNOSIS — Z9981 Dependence on supplemental oxygen: Secondary | ICD-10-CM | POA: Diagnosis not present

## 2014-07-19 DIAGNOSIS — C7931 Secondary malignant neoplasm of brain: Secondary | ICD-10-CM | POA: Diagnosis not present

## 2014-07-19 DIAGNOSIS — Z6829 Body mass index (BMI) 29.0-29.9, adult: Secondary | ICD-10-CM

## 2014-07-19 DIAGNOSIS — Z85118 Personal history of other malignant neoplasm of bronchus and lung: Secondary | ICD-10-CM | POA: Diagnosis not present

## 2014-07-19 DIAGNOSIS — C7949 Secondary malignant neoplasm of other parts of nervous system: Secondary | ICD-10-CM | POA: Diagnosis present

## 2014-07-19 DIAGNOSIS — G8929 Other chronic pain: Secondary | ICD-10-CM | POA: Diagnosis present

## 2014-07-19 DIAGNOSIS — R55 Syncope and collapse: Secondary | ICD-10-CM | POA: Diagnosis present

## 2014-07-19 DIAGNOSIS — F17200 Nicotine dependence, unspecified, uncomplicated: Secondary | ICD-10-CM | POA: Diagnosis present

## 2014-07-19 DIAGNOSIS — Z791 Long term (current) use of non-steroidal anti-inflammatories (NSAID): Secondary | ICD-10-CM

## 2014-07-19 DIAGNOSIS — Z9221 Personal history of antineoplastic chemotherapy: Secondary | ICD-10-CM

## 2014-07-19 DIAGNOSIS — I10 Essential (primary) hypertension: Secondary | ICD-10-CM | POA: Diagnosis present

## 2014-07-19 DIAGNOSIS — G589 Mononeuropathy, unspecified: Secondary | ICD-10-CM | POA: Diagnosis present

## 2014-07-19 DIAGNOSIS — E1149 Type 2 diabetes mellitus with other diabetic neurological complication: Secondary | ICD-10-CM | POA: Diagnosis present

## 2014-07-19 DIAGNOSIS — N400 Enlarged prostate without lower urinary tract symptoms: Secondary | ICD-10-CM | POA: Diagnosis present

## 2014-07-19 DIAGNOSIS — C3491 Malignant neoplasm of unspecified part of right bronchus or lung: Secondary | ICD-10-CM

## 2014-07-19 HISTORY — PX: CRANIOTOMY: SHX93

## 2014-07-19 LAB — CBC
HEMATOCRIT: 48.4 % (ref 39.0–52.0)
Hemoglobin: 15.7 g/dL (ref 13.0–17.0)
MCH: 27.4 pg (ref 26.0–34.0)
MCHC: 32.4 g/dL (ref 30.0–36.0)
MCV: 84.6 fL (ref 78.0–100.0)
PLATELETS: 175 10*3/uL (ref 150–400)
RBC: 5.72 MIL/uL (ref 4.22–5.81)
RDW: 16.2 % — AB (ref 11.5–15.5)
WBC: 5.6 10*3/uL (ref 4.0–10.5)

## 2014-07-19 LAB — GLUCOSE, CAPILLARY
Glucose-Capillary: 110 mg/dL — ABNORMAL HIGH (ref 70–99)
Glucose-Capillary: 148 mg/dL — ABNORMAL HIGH (ref 70–99)

## 2014-07-19 LAB — TYPE AND SCREEN
ABO/RH(D): O POS
Antibody Screen: NEGATIVE

## 2014-07-19 LAB — SURGICAL PCR SCREEN
MRSA, PCR: NEGATIVE
STAPHYLOCOCCUS AUREUS: NEGATIVE

## 2014-07-19 SURGERY — CRANIOTOMY TUMOR EXCISION
Anesthesia: General | Site: Head

## 2014-07-19 MED ORDER — FENTANYL CITRATE 0.05 MG/ML IJ SOLN
INTRAMUSCULAR | Status: AC
  Filled 2014-07-19: qty 5

## 2014-07-19 MED ORDER — ONDANSETRON HCL 4 MG/2ML IJ SOLN
INTRAMUSCULAR | Status: AC
  Filled 2014-07-19: qty 2

## 2014-07-19 MED ORDER — HEPARIN SODIUM (PORCINE) 5000 UNIT/ML IJ SOLN
5000.0000 [IU] | Freq: Three times a day (TID) | INTRAMUSCULAR | Status: DC
  Administered 2014-07-20 – 2014-07-21 (×4): 5000 [IU] via SUBCUTANEOUS
  Filled 2014-07-19 (×6): qty 1

## 2014-07-19 MED ORDER — EPHEDRINE SULFATE 50 MG/ML IJ SOLN
INTRAMUSCULAR | Status: AC
  Filled 2014-07-19: qty 1

## 2014-07-19 MED ORDER — PANTOPRAZOLE SODIUM 40 MG IV SOLR
40.0000 mg | Freq: Every day | INTRAVENOUS | Status: DC
  Administered 2014-07-19 – 2014-07-20 (×2): 40 mg via INTRAVENOUS
  Filled 2014-07-19 (×3): qty 40

## 2014-07-19 MED ORDER — SODIUM CHLORIDE 0.9 % IV SOLN
INTRAVENOUS | Status: DC | PRN
  Administered 2014-07-19: 08:00:00 via INTRAVENOUS

## 2014-07-19 MED ORDER — DEXAMETHASONE SODIUM PHOSPHATE 10 MG/ML IJ SOLN
6.0000 mg | Freq: Four times a day (QID) | INTRAMUSCULAR | Status: AC
  Administered 2014-07-19 – 2014-07-20 (×4): 6 mg via INTRAVENOUS
  Filled 2014-07-19 (×2): qty 1
  Filled 2014-07-19: qty 0.6
  Filled 2014-07-19: qty 1

## 2014-07-19 MED ORDER — ONDANSETRON HCL 4 MG/2ML IJ SOLN
INTRAMUSCULAR | Status: AC
Start: 2014-07-19 — End: 2014-07-19
  Filled 2014-07-19: qty 2

## 2014-07-19 MED ORDER — MIDAZOLAM HCL 5 MG/5ML IJ SOLN
INTRAMUSCULAR | Status: DC | PRN
  Administered 2014-07-19: 1 mg via INTRAVENOUS

## 2014-07-19 MED ORDER — BUPIVACAINE HCL (PF) 0.5 % IJ SOLN
INTRAMUSCULAR | Status: DC | PRN
  Administered 2014-07-19: 5 mL

## 2014-07-19 MED ORDER — TRAMADOL HCL 50 MG PO TABS: 50.0000 mg | ORAL_TABLET | Freq: Four times a day (QID) | ORAL | Status: DC | PRN

## 2014-07-19 MED ORDER — OXYCODONE HCL 5 MG/5ML PO SOLN: 5.0000 mg | Freq: Once | ORAL | Status: DC | PRN

## 2014-07-19 MED ORDER — ARTIFICIAL TEARS OP OINT
TOPICAL_OINTMENT | OPHTHALMIC | Status: DC | PRN
  Administered 2014-07-19: 1 via OPHTHALMIC

## 2014-07-19 MED ORDER — ONDANSETRON HCL 4 MG/2ML IJ SOLN
INTRAMUSCULAR | Status: DC | PRN
  Administered 2014-07-19: 4 mg via INTRAVENOUS

## 2014-07-19 MED ORDER — CEFAZOLIN SODIUM-DEXTROSE 2-3 GM-% IV SOLR
2.0000 g | Freq: Three times a day (TID) | INTRAVENOUS | Status: AC
Start: 2014-07-19 — End: 2014-07-20
  Administered 2014-07-19 – 2014-07-20 (×2): 2 g via INTRAVENOUS
  Filled 2014-07-19 (×2): qty 50

## 2014-07-19 MED ORDER — LIDOCAINE HCL (CARDIAC) 20 MG/ML IV SOLN
INTRAVENOUS | Status: DC | PRN
  Administered 2014-07-19: 30 mg via INTRAVENOUS

## 2014-07-19 MED ORDER — HYDROCODONE-ACETAMINOPHEN 5-325 MG PO TABS
1.0000 | ORAL_TABLET | ORAL | Status: DC | PRN
  Administered 2014-07-19 (×2): 1 via ORAL
  Filled 2014-07-19 (×2): qty 1

## 2014-07-19 MED ORDER — HYDROCODONE-ACETAMINOPHEN 5-325 MG PO TABS: 1.0000 | ORAL_TABLET | Freq: Four times a day (QID) | ORAL | Status: DC | PRN

## 2014-07-19 MED ORDER — MUPIROCIN 2 % EX OINT
TOPICAL_OINTMENT | CUTANEOUS | Status: AC
  Filled 2014-07-19: qty 22

## 2014-07-19 MED ORDER — GELATIN ABSORBABLE MT POWD
OROMUCOSAL | Status: DC | PRN
  Administered 2014-07-19 (×2): via TOPICAL

## 2014-07-19 MED ORDER — ROCURONIUM BROMIDE 100 MG/10ML IV SOLN
INTRAVENOUS | Status: DC | PRN
  Administered 2014-07-19: 10 mg via INTRAVENOUS
  Administered 2014-07-19: 50 mg via INTRAVENOUS
  Administered 2014-07-19: 5 mg via INTRAVENOUS
  Administered 2014-07-19 (×8): 10 mg via INTRAVENOUS

## 2014-07-19 MED ORDER — MICROFIBRILLAR COLL HEMOSTAT EX PADS
MEDICATED_PAD | CUTANEOUS | Status: DC | PRN
  Administered 2014-07-19: 1 via TOPICAL

## 2014-07-19 MED ORDER — MIDAZOLAM HCL 2 MG/2ML IJ SOLN
INTRAMUSCULAR | Status: AC
  Filled 2014-07-19: qty 2

## 2014-07-19 MED ORDER — THROMBIN 20000 UNITS EX SOLR
CUTANEOUS | Status: DC | PRN
  Administered 2014-07-19: 11:00:00 via TOPICAL

## 2014-07-19 MED ORDER — DOCUSATE SODIUM 100 MG PO CAPS
100.0000 mg | ORAL_CAPSULE | Freq: Two times a day (BID) | ORAL | Status: DC
  Administered 2014-07-19 – 2014-07-20 (×3): 100 mg via ORAL
  Filled 2014-07-19 (×4): qty 1

## 2014-07-19 MED ORDER — EPHEDRINE SULFATE 50 MG/ML IJ SOLN
INTRAMUSCULAR | Status: DC | PRN
  Administered 2014-07-19 (×3): 5 mg via INTRAVENOUS

## 2014-07-19 MED ORDER — SIMVASTATIN 20 MG PO TABS
20.0000 mg | ORAL_TABLET | Freq: Every day | ORAL | Status: DC
  Administered 2014-07-19 – 2014-07-20 (×2): 20 mg via ORAL
  Filled 2014-07-19 (×3): qty 1

## 2014-07-19 MED ORDER — MUPIROCIN 2 % EX OINT
1.0000 "application " | TOPICAL_OINTMENT | Freq: Once | CUTANEOUS | Status: AC
  Administered 2014-07-19: 1 via TOPICAL

## 2014-07-19 MED ORDER — DEXAMETHASONE SODIUM PHOSPHATE 10 MG/ML IJ SOLN
INTRAMUSCULAR | Status: DC | PRN
  Administered 2014-07-19: 10 mg via INTRAVENOUS

## 2014-07-19 MED ORDER — OXYCODONE HCL 5 MG PO TABS: 5.0000 mg | ORAL_TABLET | Freq: Once | ORAL | Status: DC | PRN

## 2014-07-19 MED ORDER — MEPERIDINE HCL 25 MG/ML IJ SOLN: 6.2500 mg | INTRAMUSCULAR | Status: DC | PRN

## 2014-07-19 MED ORDER — CEFAZOLIN SODIUM-DEXTROSE 2-3 GM-% IV SOLR
INTRAVENOUS | Status: AC
  Filled 2014-07-19: qty 50

## 2014-07-19 MED ORDER — LABETALOL HCL 5 MG/ML IV SOLN
INTRAVENOUS | Status: AC
  Filled 2014-07-19: qty 4

## 2014-07-19 MED ORDER — 0.9 % SODIUM CHLORIDE (POUR BTL) OPTIME
TOPICAL | Status: DC | PRN
  Administered 2014-07-19 (×3): 1000 mL

## 2014-07-19 MED ORDER — GLYCOPYRROLATE 0.2 MG/ML IJ SOLN
INTRAMUSCULAR | Status: DC | PRN
Start: 2014-07-19 — End: 2014-07-19
  Administered 2014-07-19: .8 mg via INTRAVENOUS

## 2014-07-19 MED ORDER — ROCURONIUM BROMIDE 50 MG/5ML IV SOLN
INTRAVENOUS | Status: AC
  Filled 2014-07-19: qty 1

## 2014-07-19 MED ORDER — LEVETIRACETAM IN NACL 500 MG/100ML IV SOLN
500.0000 mg | Freq: Two times a day (BID) | INTRAVENOUS | Status: DC
  Administered 2014-07-19 – 2014-07-21 (×4): 500 mg via INTRAVENOUS
  Filled 2014-07-19 (×6): qty 100

## 2014-07-19 MED ORDER — SODIUM CHLORIDE 0.9 % IV SOLN
INTRAVENOUS | Status: DC
  Administered 2014-07-19 – 2014-07-20 (×2): via INTRAVENOUS

## 2014-07-19 MED ORDER — PROPOFOL 10 MG/ML IV BOLUS
INTRAVENOUS | Status: DC | PRN
  Administered 2014-07-19: 50 mg via INTRAVENOUS

## 2014-07-19 MED ORDER — LABETALOL HCL 5 MG/ML IV SOLN
10.0000 mg | INTRAVENOUS | Status: DC | PRN
  Administered 2014-07-19: 10 mg via INTRAVENOUS
  Filled 2014-07-19: qty 8

## 2014-07-19 MED ORDER — LIDOCAINE-EPINEPHRINE 1 %-1:100000 IJ SOLN
INTRAMUSCULAR | Status: DC | PRN
  Administered 2014-07-19: 5 mL

## 2014-07-19 MED ORDER — NEOSTIGMINE METHYLSULFATE 10 MG/10ML IV SOLN
INTRAVENOUS | Status: AC
  Filled 2014-07-19: qty 1

## 2014-07-19 MED ORDER — SUCCINYLCHOLINE CHLORIDE 20 MG/ML IJ SOLN
INTRAMUSCULAR | Status: AC
  Filled 2014-07-19: qty 1

## 2014-07-19 MED ORDER — ACCU-CHEK MULTICLIX LANCETS MISC: 1.0000 | Status: DC | PRN

## 2014-07-19 MED ORDER — BACITRACIN 50000 UNITS IM SOLR
INTRAMUSCULAR | Status: DC | PRN
  Administered 2014-07-19: 12:00:00

## 2014-07-19 MED ORDER — ONDANSETRON HCL 4 MG PO TABS: 4.0000 mg | ORAL_TABLET | ORAL | Status: DC | PRN

## 2014-07-19 MED ORDER — MANNITOL 25 % IV SOLN
INTRAVENOUS | Status: DC | PRN
  Administered 2014-07-19: 25 g via INTRAVENOUS

## 2014-07-19 MED ORDER — RAMIPRIL 2.5 MG PO CAPS
2.5000 mg | ORAL_CAPSULE | Freq: Every day | ORAL | Status: DC
  Administered 2014-07-20 – 2014-07-21 (×2): 2.5 mg via ORAL
  Filled 2014-07-19 (×2): qty 1

## 2014-07-19 MED ORDER — PROMETHAZINE HCL 25 MG/ML IJ SOLN: 6.2500 mg | INTRAMUSCULAR | Status: DC | PRN

## 2014-07-19 MED ORDER — GLIMEPIRIDE 4 MG PO TABS
4.0000 mg | ORAL_TABLET | Freq: Every day | ORAL | Status: DC
  Administered 2014-07-20 – 2014-07-21 (×2): 4 mg via ORAL
  Filled 2014-07-19 (×3): qty 1

## 2014-07-19 MED ORDER — NEOSTIGMINE METHYLSULFATE 10 MG/10ML IV SOLN
INTRAVENOUS | Status: DC | PRN
  Administered 2014-07-19: 5 mg via INTRAVENOUS

## 2014-07-19 MED ORDER — LABETALOL HCL 5 MG/ML IV SOLN
INTRAVENOUS | Status: DC | PRN
  Administered 2014-07-19 (×2): 5 mg via INTRAVENOUS

## 2014-07-19 MED ORDER — DEXAMETHASONE SODIUM PHOSPHATE 4 MG/ML IJ SOLN: 4.0000 mg | Freq: Three times a day (TID) | INTRAMUSCULAR | Status: DC

## 2014-07-19 MED ORDER — PHENYLEPHRINE HCL 10 MG/ML IJ SOLN
10.0000 mg | INTRAMUSCULAR | Status: DC | PRN
  Administered 2014-07-19: 15 ug/min via INTRAVENOUS

## 2014-07-19 MED ORDER — GLYCOPYRROLATE 0.2 MG/ML IJ SOLN
INTRAMUSCULAR | Status: AC
  Filled 2014-07-19: qty 2

## 2014-07-19 MED ORDER — PROPOFOL 10 MG/ML IV BOLUS
INTRAVENOUS | Status: AC
  Filled 2014-07-19: qty 20

## 2014-07-19 MED ORDER — ARTIFICIAL TEARS OP OINT
TOPICAL_OINTMENT | OPHTHALMIC | Status: AC
  Filled 2014-07-19: qty 3.5

## 2014-07-19 MED ORDER — FENTANYL CITRATE 0.05 MG/ML IJ SOLN
25.0000 ug | INTRAMUSCULAR | Status: DC | PRN
Start: 2014-07-19 — End: 2014-07-19

## 2014-07-19 MED ORDER — CEFAZOLIN SODIUM-DEXTROSE 2-3 GM-% IV SOLR
2.0000 g | INTRAVENOUS | Status: AC
  Administered 2014-07-19 (×2): 2 g via INTRAVENOUS

## 2014-07-19 MED ORDER — GABAPENTIN 400 MG PO CAPS
800.0000 mg | ORAL_CAPSULE | Freq: Three times a day (TID) | ORAL | Status: DC
  Administered 2014-07-19 – 2014-07-21 (×6): 800 mg via ORAL
  Filled 2014-07-19 (×7): qty 2

## 2014-07-19 MED ORDER — SENNA 8.6 MG PO TABS
1.0000 | ORAL_TABLET | Freq: Two times a day (BID) | ORAL | Status: DC
  Administered 2014-07-19 – 2014-07-20 (×3): 8.6 mg via ORAL
  Filled 2014-07-19 (×4): qty 1

## 2014-07-19 MED ORDER — DEXAMETHASONE SODIUM PHOSPHATE 4 MG/ML IJ SOLN
4.0000 mg | Freq: Four times a day (QID) | INTRAMUSCULAR | Status: DC
  Administered 2014-07-20 – 2014-07-21 (×3): 4 mg via INTRAVENOUS
  Filled 2014-07-19 (×6): qty 1

## 2014-07-19 MED ORDER — SODIUM CHLORIDE 0.9 % IV SOLN
INTRAVENOUS | Status: DC | PRN
  Administered 2014-07-19 (×3): via INTRAVENOUS

## 2014-07-19 MED ORDER — ONDANSETRON HCL 4 MG/2ML IJ SOLN: 4.0000 mg | INTRAMUSCULAR | Status: DC | PRN

## 2014-07-19 MED ORDER — PHENYLEPHRINE 40 MCG/ML (10ML) SYRINGE FOR IV PUSH (FOR BLOOD PRESSURE SUPPORT)
PREFILLED_SYRINGE | INTRAVENOUS | Status: AC
  Filled 2014-07-19: qty 10

## 2014-07-19 MED ORDER — FENTANYL CITRATE 0.05 MG/ML IJ SOLN
INTRAMUSCULAR | Status: DC | PRN
  Administered 2014-07-19 (×2): 25 ug via INTRAVENOUS
  Administered 2014-07-19: 250 ug via INTRAVENOUS
  Administered 2014-07-19 (×2): 50 ug via INTRAVENOUS

## 2014-07-19 MED ORDER — GLYCOPYRROLATE 0.2 MG/ML IJ SOLN
INTRAMUSCULAR | Status: AC
  Filled 2014-07-19: qty 4

## 2014-07-19 MED ORDER — BACITRACIN ZINC 500 UNIT/GM EX OINT
TOPICAL_OINTMENT | CUTANEOUS | Status: DC | PRN
  Administered 2014-07-19: 1 via TOPICAL

## 2014-07-19 MED ORDER — MORPHINE SULFATE 4 MG/ML IJ SOLN
4.0000 mg | INTRAMUSCULAR | Status: DC | PRN
  Administered 2014-07-19: 4 mg via INTRAVENOUS
  Filled 2014-07-19: qty 1

## 2014-07-19 SURGICAL SUPPLY — 110 items
BANDAGE GAUZE 4  KLING STR (GAUZE/BANDAGES/DRESSINGS) ×6 IMPLANT
BENZOIN TINCTURE PRP APPL 2/3 (GAUZE/BANDAGES/DRESSINGS) IMPLANT
BLADE SAW GIGLI 16 STRL (MISCELLANEOUS) IMPLANT
BLADE SURG 15 STRL LF DISP TIS (BLADE) IMPLANT
BLADE SURG 15 STRL SS (BLADE)
BLADE SURG ROTATE 9660 (MISCELLANEOUS) ×3 IMPLANT
BLADE ULTRA TIP 2M (BLADE) IMPLANT
BNDG GAUZE ELAST 4 BULKY (GAUZE/BANDAGES/DRESSINGS) ×6 IMPLANT
BRUSH SCRUB EZ 1% IODOPHOR (MISCELLANEOUS) ×3 IMPLANT
BUR ACORN 6.0 PRECISION (BURR) ×2 IMPLANT
BUR ACORN 6.0MM PRECISION (BURR) ×1
BUR ADDG 1.1 (BURR) IMPLANT
BUR ADDG 1.1MM (BURR)
BUR MATCHSTICK NEURO 3.0 LAGG (BURR) IMPLANT
BUR ROUTER D-58 CRANI (BURR) ×3 IMPLANT
CANISTER SUCT 3000ML (MISCELLANEOUS) ×6 IMPLANT
CATH VENTRIC 35X38 W/TROCAR LG (CATHETERS) IMPLANT
CLIP TI MEDIUM 6 (CLIP) IMPLANT
CONT SPEC 4OZ CLIKSEAL STRL BL (MISCELLANEOUS) ×6 IMPLANT
CORDS BIPOLAR (ELECTRODE) ×3 IMPLANT
COVER MAYO STAND STRL (DRAPES) IMPLANT
DECANTER SPIKE VIAL GLASS SM (MISCELLANEOUS) ×3 IMPLANT
DRAIN SNY WOU 7FLT (WOUND CARE) IMPLANT
DRAIN SUBARACHNOID (WOUND CARE) IMPLANT
DRAPE MICROSCOPE LEICA (MISCELLANEOUS) ×3 IMPLANT
DRAPE NEUROLOGICAL W/INCISE (DRAPES) ×3 IMPLANT
DRAPE PROXIMA HALF (DRAPES) ×3 IMPLANT
DRAPE STERI IOBAN 125X83 (DRAPES) IMPLANT
DRAPE SURG 17X23 STRL (DRAPES) IMPLANT
DRAPE WARM FLUID 44X44 (DRAPE) ×3 IMPLANT
DRSG TELFA 3X8 NADH (GAUZE/BANDAGES/DRESSINGS) IMPLANT
DURAMATRIX ONLAY 3X3 (Plate) ×3 IMPLANT
DURAPREP 6ML APPLICATOR 50/CS (WOUND CARE) ×3 IMPLANT
ELECT CAUTERY BLADE 6.4 (BLADE) IMPLANT
ELECT REM PT RETURN 9FT ADLT (ELECTROSURGICAL) ×3
ELECTRODE REM PT RTRN 9FT ADLT (ELECTROSURGICAL) ×1 IMPLANT
EVACUATOR 1/8 PVC DRAIN (DRAIN) IMPLANT
EVACUATOR SILICONE 100CC (DRAIN) IMPLANT
FORCEPS BIPOLAR SPETZLER 8 1.0 (NEUROSURGERY SUPPLIES) ×3 IMPLANT
GAUZE SPONGE 4X4 12PLY STRL (GAUZE/BANDAGES/DRESSINGS) ×3 IMPLANT
GAUZE SPONGE 4X4 16PLY XRAY LF (GAUZE/BANDAGES/DRESSINGS) ×3 IMPLANT
GLOVE BIO SURGEON STRL SZ8 (GLOVE) ×3 IMPLANT
GLOVE BIOGEL PI IND STRL 6.5 (GLOVE) ×1 IMPLANT
GLOVE BIOGEL PI IND STRL 7.0 (GLOVE) ×1 IMPLANT
GLOVE BIOGEL PI IND STRL 7.5 (GLOVE) ×3 IMPLANT
GLOVE BIOGEL PI IND STRL 8 (GLOVE) ×1 IMPLANT
GLOVE BIOGEL PI INDICATOR 6.5 (GLOVE) ×2
GLOVE BIOGEL PI INDICATOR 7.0 (GLOVE) ×2
GLOVE BIOGEL PI INDICATOR 7.5 (GLOVE) ×6
GLOVE BIOGEL PI INDICATOR 8 (GLOVE) ×2
GLOVE ECLIPSE 6.5 STRL STRAW (GLOVE) IMPLANT
GLOVE ECLIPSE 7.5 STRL STRAW (GLOVE) ×9 IMPLANT
GLOVE EXAM NITRILE LRG STRL (GLOVE) IMPLANT
GLOVE EXAM NITRILE MD LF STRL (GLOVE) IMPLANT
GLOVE EXAM NITRILE XL STR (GLOVE) IMPLANT
GLOVE EXAM NITRILE XS STR PU (GLOVE) IMPLANT
GLOVE INDICATOR 8.5 STRL (GLOVE) ×3 IMPLANT
GLOVE SURG SS PI 7.0 STRL IVOR (GLOVE) ×12 IMPLANT
GOWN STRL REUS W/ TWL LRG LVL3 (GOWN DISPOSABLE) ×1 IMPLANT
GOWN STRL REUS W/ TWL XL LVL3 (GOWN DISPOSABLE) ×4 IMPLANT
GOWN STRL REUS W/TWL 2XL LVL3 (GOWN DISPOSABLE) ×3 IMPLANT
GOWN STRL REUS W/TWL LRG LVL3 (GOWN DISPOSABLE) ×2
GOWN STRL REUS W/TWL XL LVL3 (GOWN DISPOSABLE) ×8
HEMOSTAT POWDER KIT SURGIFOAM (HEMOSTASIS) ×3 IMPLANT
HEMOSTAT SURGICEL 2X14 (HEMOSTASIS) ×3 IMPLANT
HOOK DURA (MISCELLANEOUS) ×3 IMPLANT
KIT BASIN OR (CUSTOM PROCEDURE TRAY) ×3 IMPLANT
KIT DRAIN CSF ACCUDRAIN (MISCELLANEOUS) IMPLANT
KIT ROOM TURNOVER OR (KITS) ×3 IMPLANT
MARKER SPHERE PSV REFLC NDI (MISCELLANEOUS) ×6 IMPLANT
NEEDLE HYPO 25X1 1.5 SAFETY (NEEDLE) ×3 IMPLANT
NEEDLE SPNL 18GX3.5 QUINCKE PK (NEEDLE) IMPLANT
NS IRRIG 1000ML POUR BTL (IV SOLUTION) ×6 IMPLANT
PACK CRANIOTOMY (CUSTOM PROCEDURE TRAY) ×3 IMPLANT
PAD EYE OVAL STERILE LF (GAUZE/BANDAGES/DRESSINGS) IMPLANT
PATTIES SURGICAL .25X.25 (GAUZE/BANDAGES/DRESSINGS) IMPLANT
PATTIES SURGICAL .5 X.5 (GAUZE/BANDAGES/DRESSINGS) ×3 IMPLANT
PATTIES SURGICAL .5 X3 (DISPOSABLE) ×3 IMPLANT
PATTIES SURGICAL 1/4 X 3 (GAUZE/BANDAGES/DRESSINGS) IMPLANT
PATTIES SURGICAL 1X1 (DISPOSABLE) IMPLANT
PLATE 1.5  2HOLE LNG NEURO (Plate) ×8 IMPLANT
PLATE 1.5 2HOLE LNG NEURO (Plate) ×4 IMPLANT
RUBBERBAND STERILE (MISCELLANEOUS) ×6 IMPLANT
SCREW SELF DRILL HT 1.5/4MM (Screw) ×24 IMPLANT
SET TUBING W/EXT DISP (INSTRUMENTS) ×3 IMPLANT
SPECIMEN JAR SMALL (MISCELLANEOUS) ×3 IMPLANT
SPONGE NEURO XRAY DETECT 1X3 (DISPOSABLE) IMPLANT
SPONGE SURGIFOAM ABS GEL 100 (HEMOSTASIS) ×3 IMPLANT
STAPLER VISISTAT 35W (STAPLE) ×9 IMPLANT
STOCKINETTE 6  STRL (DRAPES) ×4
STOCKINETTE 6 STRL (DRAPES) ×2 IMPLANT
SUT ETHILON 3 0 FSL (SUTURE) IMPLANT
SUT ETHILON 3 0 PS 1 (SUTURE) IMPLANT
SUT NURALON 4 0 TR CR/8 (SUTURE) ×6 IMPLANT
SUT SILK 0 TIES 10X30 (SUTURE) IMPLANT
SUT VIC AB 0 CT1 18XCR BRD8 (SUTURE) ×2 IMPLANT
SUT VIC AB 0 CT1 8-18 (SUTURE) ×4
SUT VIC AB 3-0 SH 8-18 (SUTURE) ×9 IMPLANT
SYR 20ML ECCENTRIC (SYRINGE) ×3 IMPLANT
SYR CONTROL 10ML LL (SYRINGE) ×3 IMPLANT
TIP SONASTAR STD MISONIX 1.9 (TRAY / TRAY PROCEDURE) IMPLANT
TIP STRAIGHT 25KHZ (INSTRUMENTS) ×3 IMPLANT
TOWEL OR 17X24 6PK STRL BLUE (TOWEL DISPOSABLE) ×3 IMPLANT
TOWEL OR 17X26 10 PK STRL BLUE (TOWEL DISPOSABLE) ×3 IMPLANT
TRAY FOLEY CATH 14FRSI W/METER (CATHETERS) IMPLANT
TRAY FOLEY CATH 16FRSI W/METER (SET/KITS/TRAYS/PACK) ×3 IMPLANT
TUBE CONNECTING 12'X1/4 (SUCTIONS)
TUBE CONNECTING 12X1/4 (SUCTIONS) IMPLANT
UNDERPAD 30X30 INCONTINENT (UNDERPADS AND DIAPERS) ×3 IMPLANT
WATER STERILE IRR 1000ML POUR (IV SOLUTION) ×3 IMPLANT

## 2014-07-19 NOTE — Plan of Care (Signed)
Problem: Consults Goal: Diagnosis - Craniotomy Right temporal tumors

## 2014-07-19 NOTE — Anesthesia Preprocedure Evaluation (Addendum)
Anesthesia Evaluation  Patient identified by MRN, date of birth, ID band Patient awake    Reviewed: Allergy & Precautions, H&P , NPO status , Patient's Chart, lab work & pertinent test results  History of Anesthesia Complications Negative for: history of anesthetic complications  Airway Mallampati: I TM Distance: >3 FB Neck ROM: Full    Dental  (+) Edentulous Upper, Edentulous Lower   Pulmonary shortness of breath, COPD COPD inhaler and oxygen dependent, Current Smoker,  Non-small cell lung cancer with brain mets: chemo, XRT, surgery breath sounds clear to auscultation        Cardiovascular hypertension, Pt. on medications - anginaRhythm:Regular Rate:Normal  '07 ECHO: EF 55%, valves OK   Neuro/Psych Syncope: brain mets    GI/Hepatic negative GI ROS, (+)     substance abuse  alcohol use,   Endo/Other  diabetes (glu 110), Oral Hypoglycemic Agents  Renal/GU negative Renal ROS     Musculoskeletal   Abdominal (+) + obese,   Peds  Hematology   Anesthesia Other Findings   Reproductive/Obstetrics                         Anesthesia Physical Anesthesia Plan  ASA: III  Anesthesia Plan: General   Post-op Pain Management:    Induction: Intravenous  Airway Management Planned: Oral ETT  Additional Equipment: Arterial line  Intra-op Plan:   Post-operative Plan: Extubation in OR  Informed Consent: I have reviewed the patients History and Physical, chart, labs and discussed the procedure including the risks, benefits and alternatives for the proposed anesthesia with the patient or authorized representative who has indicated his/her understanding and acceptance.     Plan Discussed with: CRNA and Surgeon  Anesthesia Plan Comments: (Plan routine monitors, A line, GETA)        Anesthesia Quick Evaluation

## 2014-07-19 NOTE — Op Note (Signed)
PREOP DIAGNOSIS:  1. Right anterior temporal tumor 2. Right posterior temporal tumor   POSTOP DIAGNOSIS: Same  PROCEDURE: 1. Stereotactic right temporal craniotomy for resection of two temporal tumors 2. Use of intraoperative microscope for microdissection  SURGEON: Dr. Consuella Lose, MD  ASSISTANT: Dr. Kary Kos, MD  ANESTHESIA: General Endotracheal  EBL: 250cc  SPECIMENS:  1. Anterior right temporal tumor for permanent path 2. Posterior right temporal tumor for permanent path  DRAINS: None  COMPLICATIONS: None immediate  CONDITION: Hemodynamically stable to PACU  HISTORY: Rodney Powers is a 58 y.o. male initially seen approximately one week ago in the emergency department syncopal episode possibly related to seizure. Workup included MRI of the brain which demonstrated 2 distinct right temporal tumors. The patient does have a history of lung cancer which was treated with chemotherapy radiation approximately one year ago. After he was placed on steroids, the patient felt better and he was discharged home. He underwent preoperative stereotactic radiosurgery to both right temporal lesions, and now presents for surgical resection. The risks and benefits of surgery explained in detail to the patient and his significant other. These risks included hemiparesis, speech difficulty, seizures, hydrocephalus, bleeding infection. With the understanding of these risks, the patient provided informed consent. All his questions were answered.  PROCEDURE IN DETAIL: After informed consent was obtained and witnessed, the patient was brought to the operating room. After induction of general anesthesia, the patient was positioned on the operative table in the supine position. All pressure points were meticulously padded. The 3-point Mayfield head holder was then applied to the patient and affixed to the table. Utilizing the preoperative stereotactic MRI scan, surface markers were co-registered  until satisfactory accuracy was achieved. Utilizing the stereotactic scan, a curvilinear skin incision was marked out with allow access to the posterior temporal as was the anterior temporal lesions.     After timeout was conducted, skin incision was infiltrated with local anesthetic with epinephrine. Skin incision was then made sharply and Bovie electrocautery was used to dissect the subcutaneous tissue and the galea aponeurosis. Raney clips were then applied for hemostasis. The skin flap was reflected anteriorly, and the temporalis fascia was identified. This was incised just below the superior temporal line, and the temporalis muscle was reflected inferiorly. High-speed drill was then used to create a frontotemporal craniotomy. The dura was noted to be intact.   hemostasis was achieved on the dural surface but utilizing bipolar electrocautery and morcellized Gelfoam and thrombin. At this point the microscope was draped sterilely and brought into the field, and the remainder of the case was done under the microscope using microdissection.   The stereotactic system was used to confirm the extent of the craniotomy to allow access to the posterior and anterior tumors. The dura was then opened in a linear fashion, and "Y'ed" anteriorly and posteriorly.  at this point, the stereotactic system was again used to select a location for corticectomy to allow access to the posterior temporal tumor. Bipolar electrocautery was then used to cauterize the peel surface, and utilizing the ultrasonic aspirator, the white matter was dissected until a firm, slightly bluish tumor was encountered. Utilizing the bipolar electrocautery and the ultrasonic aspirator, the gliotic plane around the tumor was developed initially in a posterior direction, followed by inferiorly, anteriorly, and superiorly. The deep plane was then dissected last. In removing the tumor, the ventricular ependyma was violated, and CSF was obtained. Once the  tumor was removed, the tumor borders were inspected, and no  residual tumor appeared to be remaining. Hemostasis was then achieved using a combination of bipolar electrocautery and morcellized Gelfoam and thrombin.   Attention was then turned to the anterior temporal tumor. Utilizing the same technique, the stereotactic system was used to identify the surface projection on the anterior temporal tip. Corticectomy was then made with the bipolar, and utilizing the ultrasonic aspirator and bipolar, the white matter was dissected until a similar tumor was seen in the temporal pole. This was then dissected in all margins, and removed en bloc. Tumor margins were inspected and no residual tumor remaining. Hemostasis was again achieved using bipolar electrocautery and morcellized Gelfoam and thrombin.  At this point the wound  was irrigated with copious amounts of normal saline irrigation. Good hemostasis was confirmed on the brain surface. The dura was then closed using a interrupted 4-0 Nurolon stitches.  a piece of dura matrix was placed over the dural incision. Muscle was then closed using interrupted 0 Vicryl stitches, and the galea was closed using interrupted 3-0 Vicryl sutures. The skin was closed using standard surgical skin staples. Sterile dressing was then applied after the Mayfield head holder was removed. The patient was then transferred to the stretcher and taken to the  postanesthesia care unit in stable hemodynamic condition.  At the end of the case all sponge, needle, and instrument counts were correct.

## 2014-07-19 NOTE — Progress Notes (Signed)
07/19/14 0744  OBSTRUCTIVE SLEEP APNEA  Have you ever been diagnosed with sleep apnea through a sleep study? No  Do you snore loudly (loud enough to be heard through closed doors)?  1  Do you often feel tired, fatigued, or sleepy during the daytime? 1  Has anyone observed you stop breathing during your sleep? 0  Do you have, or are you being treated for high blood pressure? 1  BMI more than 35 kg/m2? 0  Age over 57 years old? 1  Neck circumference greater than 40 cm/16 inches? 1  Gender: 1  Obstructive Sleep Apnea Score 6  Score 4 or greater  Results sent to PCP

## 2014-07-19 NOTE — Anesthesia Procedure Notes (Signed)
Procedure Name: Intubation Performed by: Darlynn Ricco Pre-anesthesia Checklist: Patient identified, Timeout performed, Emergency Drugs available, Suction available and Patient being monitored Patient Re-evaluated:Patient Re-evaluated prior to inductionOxygen Delivery Method: Circle system utilized Preoxygenation: Pre-oxygenation with 100% oxygen Intubation Type: IV induction Ventilation: Mask ventilation with difficulty Grade View: Grade I Tube type: Oral Tube size: 7.5 mm Number of attempts: 1 Airway Equipment and Method: Stylet and Video-laryngoscopy Placement Confirmation: ETT inserted through vocal cords under direct vision,  positive ETCO2,  CO2 detector and breath sounds checked- equal and bilateral Secured at: 23 cm Tube secured with: Tape Dental Injury: Teeth and Oropharynx as per pre-operative assessment  Difficulty Due To: Difficulty was anticipated Comments: DL x 1 with miller 2- no view. Glidescope

## 2014-07-19 NOTE — H&P (View-Only) (Signed)
CC:  Chief Complaint  Patient presents with  . Code Stroke    HPI: Rodney Powers is a 58 y.o. male seen in the ED after being brought to the hospital after passing out. He says he was at home in the kitchen when he suddenly felt faint and passed out. He was told he was only out for a few seconds. Shortly thereafter he had another similar episode and was brought to the ED. After these episodes he says he had some weakness on the left arm and leg. He did not have any incontinence.   Currently, he feels back to normal denying any HA, visual changes, N/T/W.   He has a history of non-small cell lung CA diagnosed last year and underwent chemo and radiation ending August last year.   PMH: Past Medical History  Diagnosis Date  . Back pain, chronic   . Mediastinal mass   . Urinary retention   . Lung cancer 04/07/2013  . Diabetic neuropathy   . Diabetes mellitus, type II     takes Gimepimide and Metformin daily  . Enlarged prostate     takes Flomax every evening  . Hypertension     slightly elevated recently;no meds;just watching for now  . COPD (chronic obstructive pulmonary disease)     emphysema  . Shortness of breath     with exertion  . Pneumonia     hx of;last time about 20yrs ago  . History of bronchitis     last time 34yrs ago  . Joint pain     stiffness and cramping in both hands  . Urinary frequency   . Nocturia   . Anemia     no meds and hasn't had a transfusion ever  . Anxiety   . Depression     no meds required;hospitatlized 65yrs ago for this  . Hx of radiation therapy 04/19/13- 06/07/13    left upper central chest, 63 gray 35 fx  . Heart murmur     as a baby  . Diarrhea   . Neuropathy   . Back pain 11/14/2013  . On home O2     2L N/C    PSH: Past Surgical History  Procedure Laterality Date  . Video bronchoscopy with endobronchial ultrasound Bilateral 03/10/2013    Procedure: VIDEO BRONCHOSCOPY WITH ENDOBRONCHIAL ULTRASOUND;  Surgeon: Rigoberto Noel, MD;   Location: [place];  Service: Pulmonary;  Laterality: Bilateral;  . Mediastinotomy chamberlain mcneil Left 03/24/2013    Procedure: MEDIASTINOTOMY CHAMBERLAIN MCNEIL;  Surgeon: Melrose Nakayama, MD;  Location: [place];  Service: Thoracic;  Laterality: Left;  . Excisional hemorrhoidectomy      > 30 yrs ago  . Colonoscopy    . Direct laryngoscopy N/A 07/01/2013    Procedure: DIRECT LARYNGOSCOPY;  Surgeon: Izora Gala, MD;  Location: [place];  Service: ENT;  Laterality: N/A;  . Esophagoscopy N/A 07/01/2013    Procedure: ESOPHAGOSCOPY WITH BIOPSY;  Surgeon: Izora Gala, MD;  Location: [place];  Service: ENT;  Laterality: N/A;  . Direct laryngoscopy N/A 08/01/2013    Procedure: DIRECT LARYNGOSCOPY WITH EXCISION OF PHARYNGEAL MASS;  Surgeon: Izora Gala, MD;  Location: [place];  Service: ENT;  Laterality: N/A;  . Tonsillectomy Right 08/01/2013    Procedure: TONSILLECTOMY;  Surgeon: Izora Gala, MD;  Location: [place];  Service: ENT;  Laterality: Right;    SH: History  Substance Use Topics  . Smoking status: Current Every Day Smoker -- 0.50 packs/day for 41 years  Types: Cigarettes    Start date: 07/18/1971  . Smokeless tobacco: Never Used     Comment: nicotene patch  . Alcohol Use: 8.4 oz/week    14 Cans of beer per week     Comment: daily beer 2    MEDS: Prior to Admission medications   Medication Sig Start Date End Date Taking? Authorizing Provider  gabapentin (NEURONTIN) 400 MG capsule Take 800 mg by mouth 3 (three) times daily.   Yes Historical Provider, MD  glimepiride (AMARYL) 4 MG tablet Take 1 tablet (4 mg total) by mouth daily before breakfast. 03/14/14  Yes Lorayne Marek, MD  HYDROcodone-acetaminophen (NORCO/VICODIN) 5-325 MG per tablet Take 1-2 tablets by mouth every 6 (six) hours as needed for moderate pain.   Yes Historical Provider, MD  hydrocortisone 2.5 % cream Apply topically 2 (two) times daily. 03/14/14  Yes Lorayne Marek, MD  ramipril (ALTACE) 2.5 MG capsule Take 1 capsule  (2.5 mg total) by mouth daily. 03/14/14  Yes Lorayne Marek, MD  simvastatin (ZOCOR) 20 MG tablet Take 1 tablet (20 mg total) by mouth at bedtime. 12/30/13  Yes Reyne Dumas, MD  Lancets (ACCU-CHEK MULTICLIX) lancets Use as instructed 12/21/13   Heath Lark, MD    ALLERGY: No Known Allergies  ROS: ROS  NEUROLOGIC EXAM: Awake, alert, oriented Memory and concentration grossly intact Speech fluent, appropriate CN grossly intact Motor exam: Upper Extremities Deltoid Bicep Tricep Grip  Right 5/5 5/5 5/5 5/5  Left 5/5 5/5 5/5 5/5   Lower Extremity IP Quad PF DF EHL  Right 5/5 5/5 5/5 5/5 5/5  Left 5/5 5/5 5/5 5/5 5/5   Sensation grossly intact to LT  [facility]: MRI brain w/w/o Gad was reviewed which demonstrates a enhancing lesion measuring ~1.7cm in the right temporal pole with surrounding vasogenic edema. There is a larger, ~2.7cm enhancing lesion deep to the middle temp gyrus on the right. There is edema throughout the right temporal lobe with effacement of the right lat ventricle and R->L MLS.  IMPRESSION: - 58 y.o. male with newly discovered temporal lesions likely metastatic [place] lung CA. - Syncopal episodes may be SZ given location of tumors and his reported left hemiparesis which has resolved  PLAN: - Keppra 500mg  BID - Dexamethasone 4mg  q6 hrs - Pt will likely need resection of the two temporal lesions with preoperative SRS a few days prior, which would be done at [facility].  I did review the findings of the MRI with the pt and the likely need for surgery with adjuvant radiation. All his questions were answered.

## 2014-07-19 NOTE — Transfer of Care (Signed)
Immediate Anesthesia Transfer of Care Note  Patient: Rodney Powers  Procedure(s) Performed: Procedure(s) with comments: CRANIOTOMY TUMOR EXCISION with Stealth (N/A) - CRANIOTOMY FOR EXCISION OF TUMOR  Patient Location: PACU  Anesthesia Type:General  Level of Consciousness: awake and alert   Airway & Oxygen Therapy: Patient Spontanous Breathing and Patient connected to face mask oxygen  Post-op Assessment: Report given to PACU RN  Post vital signs: Reviewed and stable  Complications: No apparent anesthesia complications

## 2014-07-19 NOTE — Anesthesia Postprocedure Evaluation (Signed)
  Anesthesia Post-op Note  Patient: Rodney Powers  Procedure(s) Performed: Procedure(s) with comments: CRANIOTOMY TUMOR EXCISION with Stealth (N/A) - CRANIOTOMY FOR EXCISION OF TUMOR  Patient Location: PACU  Anesthesia Type:General  Level of Consciousness: awake, patient cooperative and responds to stimulation  Airway and Oxygen Therapy: Patient Spontanous Breathing and Patient connected to nasal cannula oxygen  Post-op Pain: none  Post-op Assessment: Post-op Vital signs reviewed, Patient's Cardiovascular Status Stable, Respiratory Function Stable, Patent Airway, No signs of Nausea or vomiting and Pain level controlled  Post-op Vital Signs: Reviewed and stable  Last Vitals:  Filed Vitals:   07/19/14 1503  BP:   Pulse: 70  Temp: 36.7 C  Resp: 18    Complications: No apparent anesthesia complications

## 2014-07-19 NOTE — Interval H&P Note (Signed)
History and Physical Interval Note: Since his evaluation in the ED one week ago, the patient was transferred to [facility], placed on steroids and had good resolution of HA, and no more syncope. He was discharged and underwent preoperative radiosurgery to both right temporal lesions on Monday. He now presents for surgical resection.   I reviewed the rationale for surgery as well as the risks of the procedure with the patient and his significant other. These include but are not limited to stroke - weakness/paralysis, speech difficulty, bleeding, infection, SZ, and hydrocephalus. All his questions were answered. He provided informed consent.  07/19/2014 8:34 AM  [name]  has presented today for surgery, with the diagnosis of LUNG CANCER, BRAIN TUMOR  The various methods of treatment have been discussed with the patient and family. After consideration of risks, benefits and other options for treatment, the patient has consented to  Procedure(s) with comments: CRANIOTOMY TUMOR EXCISION with Stealth (N/A) - CRANIOTOMY FOR EXCISION OF TUMOR as a surgical intervention .  The patient's history has been reviewed, patient examined, no change in status, stable for surgery.  I have reviewed the patient's chart and labs.  Questions were answered to the patient's satisfaction.     Donold Marotto, C

## 2014-07-20 ENCOUNTER — Encounter (HOSPITAL_COMMUNITY): Payer: Self-pay | Admitting: Neurosurgery

## 2014-07-20 ENCOUNTER — Inpatient Hospital Stay (HOSPITAL_COMMUNITY): Payer: Medicaid Other

## 2014-07-20 LAB — CBC
HCT: 43 % (ref 39.0–52.0)
Hemoglobin: 13.8 g/dL (ref 13.0–17.0)
MCH: 27.2 pg (ref 26.0–34.0)
MCHC: 32.1 g/dL (ref 30.0–36.0)
MCV: 84.8 fL (ref 78.0–100.0)
PLATELETS: 168 10*3/uL (ref 150–400)
RBC: 5.07 MIL/uL (ref 4.22–5.81)
RDW: 16.2 % — ABNORMAL HIGH (ref 11.5–15.5)
WBC: 10.6 10*3/uL — ABNORMAL HIGH (ref 4.0–10.5)

## 2014-07-20 LAB — BASIC METABOLIC PANEL
ANION GAP: 14 (ref 5–15)
BUN: 16 mg/dL (ref 6–23)
CO2: 20 mEq/L (ref 19–32)
Calcium: 8.4 mg/dL (ref 8.4–10.5)
Chloride: 105 mEq/L (ref 96–112)
Creatinine, Ser: 0.96 mg/dL (ref 0.50–1.35)
Glucose, Bld: 186 mg/dL — ABNORMAL HIGH (ref 70–99)
Potassium: 4.4 mEq/L (ref 3.7–5.3)
SODIUM: 139 meq/L (ref 137–147)

## 2014-07-20 LAB — GLUCOSE, CAPILLARY: GLUCOSE-CAPILLARY: 268 mg/dL — AB (ref 70–99)

## 2014-07-20 MED ORDER — SODIUM CHLORIDE 0.9 % IV SOLN
INTRAVENOUS | Status: DC
  Administered 2014-07-20: 23:00:00 via INTRAVENOUS

## 2014-07-20 MED ORDER — GADOBENATE DIMEGLUMINE 529 MG/ML IV SOLN
20.0000 mL | Freq: Once | INTRAVENOUS | Status: AC
  Administered 2014-07-20: 20 mL via INTRAVENOUS

## 2014-07-20 NOTE — Progress Notes (Signed)
Patient ID: Rodney Powers, male   DOB: May 27, 1956, 58 y.o.   MRN: [id number] Subjective:  The patient is alert and pleasant. He complains of some right jaw pain otherwise is without complaints. He looks well.  Objective: Vital signs in last 24 hours: Temp:  [97.5 F (36.4 C)-99.1 F (37.3 C)] 98.2 F (36.8 C) (08/06 0800) Pulse Rate:  [51-81] 62 (08/06 1000) Resp:  [12-23] 12 (08/06 1000) BP: (125-153)/(68-82) 138/69 mmHg (08/06 1100) SpO2:  [90 %-100 %] 95 % (08/06 1100) Arterial Line BP: (79-188)/(62-74) 179/71 mmHg (08/06 0900) Weight:  [106.7 kg (235 lb 3.7 oz)] 106.7 kg (235 lb 3.7 oz) (08/05 1515)  Intake/Output from previous day: 08/05 0701 - 08/06 0700 In: 4935 [P.O.:480; I.V.:4155; IV Piggyback:300] Out: 2765 [Urine:2365; Blood:400] Intake/Output this shift: Total I/O In: 400 [I.V.:400] Out: 1125 [Urine:1125]  Physical exam the patient is alert and oriented x3. He is moving all 4 extremities well. His dressing is clean and dry. His pupils are equal. His right eye is swollen.  Lab Results:  Recent Labs  07/19/14 0748 07/20/14 0500  WBC 5.6 10.6*  HGB 15.7 13.8  HCT 48.4 43.0  PLT 175 168   BMET  Recent Labs  07/20/14 0500  NA 139  K 4.4  CL 105  CO2 20  GLUCOSE 186*  BUN 16  CREATININE 0.96  CALCIUM 8.4    Studies/Results: Mr Kizzie Fantasia Contrast  07/18/2014   CLINICAL DATA:  58 year old male with lung cancer and 2 right temporal lobe brain masses. Study for stereotactic radiosurgery planning. Subsequent encounter.  EXAM: MRI HEAD WITHOUT AND WITH CONTRAST  TECHNIQUE: Multiplanar, multiecho pulse sequences of the brain and surrounding structures were obtained without and with intravenous contrast.  CONTRAST:  33mL MULTIHANCE GADOBENATE DIMEGLUMINE 529 MG/ML IV SOLN  COMPARISON:  Brain MRI 07/13/2014 and earlier.  FINDINGS: Micro lobulated enhancing mass at the anterior right temporal tip measuring up to 19 mm diameter, not significantly changed (series  12, image 80).  Larger similar lobulated intensely enhancing mass centered at the junction of the right middle and superior temporal gyri is stable (series 12, image 108). This lesion has some associated blood products (series 7, image 16).  No new brain metastasis identified.  Stable cerebral edema and mass effect associated with these lesions. Leftward midline shift of 10 mm. Mass effect on the right lateral ventricle. No ventriculomegaly. Some edema tracking into the right optic radiations. No restricted diffusion or evidence of acute infarction. Major intracranial vascular flow voids are stable. Negative pituitary and cervicomedullary junction. Visualized orbit soft tissues are within normal limits. Stable paranasal sinuses and mastoids. Chronic right frontal convexity scalp lipoma incidentally re- identified. Bone marrow signal is stable since 2014, no suspicious osseous lesion identified. Grossly negative visualized cervical spine.  IMPRESSION: 1. Two brain metastases affecting the right hemisphere are stable since 07/13/2014. No new brain metastasis identified. 2. Stable associated cerebral edema and mass effect.   Electronically Signed   By: Lars Pinks M.D.   On: 07/18/2014 16:34    Assessment/Plan: Postop day 1: The patient is doing well. We will mobilize him. He will likely go to the floor tomorrow.  LOS: 1 day     Kyren Knick D 07/20/2014, 12:26 PM

## 2014-07-20 NOTE — Progress Notes (Signed)
PT Cancellation Note  Patient Details Name: Rodney Powers MRN: [id number] DOB: 1956/07/30   Cancelled Treatment:    Reason Eval/Treat Not Completed: Patient at procedure or test/unavailable (MRI)   Duncan Dull 07/20/2014, 2:08 PM Alben Deeds, [place] DPT  [phone]

## 2014-07-20 NOTE — Evaluation (Signed)
Physical Therapy Evaluation Patient Details Name: Rodney Powers MRN: [id number] DOB: [birth year] Today's Date: 07/20/2014   History of Present Illness  Pt is a 58 year old male admitted from home due to syncope episodes and found to have multiple brain metastasis on MRI with hx of lung cancer s/p treatment and dysphagia, patient is now s/p tumor resection.  Clinical Impression  Patient independent with mobility and stair negotiation. High level balance in tact at this time. Patient does have visual field deficits secondary to edema around right eye. Educated patient on safety and navigation with decreased visual field. Spoke with patient regarding scanning techniques for increased safety. At this time do not feel patient demonstrates further acute PT needs. Will sign off, patient in agreement.    Follow Up Recommendations No PT follow up    Equipment Recommendations  None recommended by PT    Recommendations for Other Services       Precautions / Restrictions Precautions Precautions: Fall Precaution Comments: educated patient regarding mobility with visual deficits Restrictions Weight Bearing Restrictions: No      Mobility  Bed Mobility Overal bed mobility: Modified Independent                Transfers Overall transfer level: Modified independent                  Ambulation/Gait Ambulation/Gait assistance: Independent Ambulation Distance (Feet): 440 Feet Assistive device: None Gait Pattern/deviations: WFL(Within Functional Limits)     General Gait Details: no unsteadiness or LOB observed  Stairs Stairs: Yes Stairs assistance: Modified independent (Device/Increase time) Stair Management: One rail Right;Forwards;Alternating pattern Number of Stairs: 6 General stair comments: performed x2 without difficulty  Wheelchair Mobility    Modified Rankin (Stroke Patients Only)       Balance                                 Standardized  Balance Assessment Standardized Balance Assessment : Dynamic Gait Index   Dynamic Gait Index Level Surface: Normal Change in Gait Speed: Normal Gait with Horizontal Head Turns: Normal Gait with Vertical Head Turns: Normal Gait and Pivot Turn: Normal Step Over Obstacle: Normal Step Around Obstacles: Mild Impairment Steps: Mild Impairment Total Score: 22       Pertinent Vitals/Pain      Home Living Family/patient expects to be discharged to:: Private residence Living Arrangements: Spouse/significant other;Non-relatives/Friends (with friend)   Type of Home: House Home Access: Stairs to enter Entrance Stairs-Rails: Can reach both Entrance Stairs-Number of Steps: 4 Home Layout: One level Home Equipment: None      Prior Function Level of Independence: Independent               Hand Dominance   Dominant Hand: Right    Extremity/Trunk Assessment                      Cervical / Trunk Assessment: Normal  Communication   Communication:  (swollen right eye blurred vision)  Cognition Arousal/Alertness: Awake/alert Behavior During Therapy: WFL for tasks assessed/performed Overall Cognitive Status: Within Functional Limits for tasks assessed                      General Comments General comments (skin integrity, edema, etc.): patient with swollen right eye impacting vision    Exercises        Assessment/Plan    PT Assessment  Patent does not need any further PT services  PT Diagnosis     PT Problem List    PT Treatment Interventions     PT Goals (Current goals can be found in the Care Plan section) Acute Rehab PT Goals PT Goal Formulation: No goals set, d/c therapy    Frequency     Barriers to discharge        Co-evaluation               End of Session Equipment Utilized During Treatment: Gait belt Activity Tolerance: Patient tolerated treatment well Patient left: in chair;with call bell/phone within reach Nurse  Communication: Mobility status         Time: 8657-8469 PT Time Calculation (min): 24 min   Charges:   PT Evaluation $Initial PT Evaluation Tier I: 1 Procedure PT Treatments $Gait Training: 8-22 mins $Self Care/Home Management: 8-22   PT G CodesDuncan Dull 07/20/2014, 4:42 PM Alben Deeds, [place] DPT  [phone]

## 2014-07-20 NOTE — Progress Notes (Signed)
Patient arrived to 4N room 21 via wheelchair in stable condition. Patient assesed and oriented to room and unit.  Patient sitting in chair and call bell in reach. Will continue to monitor patient. Burnell Blanks, RN

## 2014-07-20 NOTE — Progress Notes (Signed)
UR completed.  Jamiel Goncalves, RN BSN MHA CCM Trauma/Neuro ICU Case Manager [phone]  

## 2014-07-21 LAB — GLUCOSE, CAPILLARY: Glucose-Capillary: 153 mg/dL — ABNORMAL HIGH (ref 70–99)

## 2014-07-21 MED ORDER — PANTOPRAZOLE SODIUM 40 MG PO TBEC: 40.0000 mg | DELAYED_RELEASE_TABLET | Freq: Every day | ORAL | Status: DC

## 2014-07-21 MED ORDER — DSS 100 MG PO CAPS: 100.0000 mg | ORAL_CAPSULE | Freq: Two times a day (BID) | ORAL | Status: DC

## 2014-07-21 MED ORDER — LEVETIRACETAM 500 MG PO TABS: 500.0000 mg | ORAL_TABLET | Freq: Two times a day (BID) | ORAL | Status: DC

## 2014-07-21 MED ORDER — DEXAMETHASONE 2 MG PO TABS: 2.0000 mg | ORAL_TABLET | Freq: Three times a day (TID) | ORAL | Status: DC

## 2014-07-21 MED ORDER — DEXAMETHASONE 2 MG PO TABS
2.0000 mg | ORAL_TABLET | Freq: Three times a day (TID) | ORAL | Status: DC
Start: 2014-07-21 — End: 2014-08-01

## 2014-07-21 NOTE — Progress Notes (Signed)
Inpatient Diabetes Program Recommendations  AACE/ADA: New Consensus Statement on Inpatient Glycemic Control (2013)  Target Ranges:  Prepandial:   less than 140 mg/dL      Peak postprandial:   less than 180 mg/dL (1-2 hours)      Critically ill patients:  140 - 180 mg/dL   Results for MIGEL, HANNIS (MRN [id number]) as of 07/21/2014 11:25  Ref. Range 07/19/2014 07:56 07/19/2014 14:33 07/20/2014 22:31 07/21/2014 06:39  Glucose-Capillary Latest Range: 70-99 mg/dL 110 (H) 148 (H) 268 (H) 153 (H)   Reason for assessment: elevated CBG  Diabetes history: Type 2 Outpatient Diabetes medications: Amaryl 4mg /day Current orders for Inpatient glycemic control: Amaryl 4mg /day  Please consider changing his diet to carb modified and adding Novolog moderate mealtime correction 0-15 units and hs correction.   Gentry Fitz, RN, BA, MHA, CDE Diabetes Coordinator Inpatient Diabetes Program  [phone] (Team Pager) [phone] [facility]) 07/21/2014 11:41 AM

## 2014-07-21 NOTE — Progress Notes (Signed)
Discharge orders received. Pt for discharge home today. IV d/c'd. Dressing clean, dry, intact to head. Staples intact to head. Pt given discharge instructions with verbalized understanding. Family in room to assist with discharge. Staff walked pt downstairs.

## 2014-07-21 NOTE — Discharge Summary (Signed)
Physician Discharge Summary  Patient ID: Rodney Powers MRN: [id number] DOB/AGE: 05/25/1956 58 y.o.  Admit date: 07/19/2014 Discharge date: 07/21/2014  Admission Diagnoses: Brain metastasis  Discharge Diagnoses: The same Active Problems:   Metastatic lung cancer (metastasis from lung to other site)   Metastatic adenocarcinoma to brain   Discharged Condition: good  Hospital Course: Dr. Kathyrn Sheriff  performed a right craniotomy for resection of brain metastasis on 07/19/2014. The surgery went well.  The patient's postoperative course was unremarkable. On postoperative day #2 he requested discharge to home. The patient was given oral and written discharge instructions. All his questions were answered.  Consults: None Significant Diagnostic Studies: Postoperative brain MRI,  Treatments: Right craniotomy for resection of brain metastasis Discharge Exam: Blood pressure 136/79, pulse 57, temperature 98 F (36.7 C), temperature source Oral, resp. rate 18, height 6\' 3"  (1.905 m), weight 106.7 kg (235 lb 3.7 oz), SpO2 98.00%. The patient is alert and pleasant. He looks well. His incision is healing well. His strength is normal. His speech is normal.  Disposition: Home  Discharge Instructions   Call MD for:  difficulty breathing, headache or visual disturbances    Complete by:  As directed      Call MD for:  extreme fatigue    Complete by:  As directed      Call MD for:  hives    Complete by:  As directed      Call MD for:  persistant dizziness or light-headedness    Complete by:  As directed      Call MD for:  persistant nausea and vomiting    Complete by:  As directed      Call MD for:  redness, tenderness, or signs of infection (pain, swelling, redness, odor or green/yellow discharge around incision site)    Complete by:  As directed      Call MD for:  severe uncontrolled pain    Complete by:  As directed      Call MD for:  temperature >100.4    Complete by:  As directed      Diet - low sodium heart healthy    Complete by:  As directed      Discharge instructions    Complete by:  As directed   Call [phone] for a followup appointment. Take a stool softener while you are using pain medications.     Driving Restrictions    Complete by:  As directed   Do not drive for 2 weeks.     Increase activity slowly    Complete by:  As directed      Lifting restrictions    Complete by:  As directed   Do not lift more than 5 pounds. No excessive bending or twisting.     May shower / Bathe    Complete by:  As directed   He may shower after the pain she is removed 3 days after surgery. Leave the incision alone.     Remove dressing in 24 hours    Complete by:  As directed             Medication List    STOP taking these medications       traMADol 50 MG tablet  Commonly known as:  ULTRAM      TAKE these medications       accu-chek multiclix lancets  Use as instructed     dexamethasone 2 MG tablet  Commonly known as:  DECADRON  Take  1 tablet (2 mg total) by mouth every 8 (eight) hours.     DSS 100 MG Caps  Take 100 mg by mouth 2 (two) times daily.     gabapentin 400 MG capsule  Commonly known as:  NEURONTIN  Take 800 mg by mouth 3 (three) times daily.     glimepiride 4 MG tablet  Commonly known as:  AMARYL  Take 4 mg by mouth daily with breakfast.     HYDROcodone-acetaminophen 5-325 MG per tablet  Commonly known as:  NORCO/VICODIN  Take 1-2 tablets by mouth every 6 (six) hours as needed for moderate pain.     levETIRAcetam 500 MG tablet  Commonly known as:  KEPPRA  Take 1 tablet (500 mg total) by mouth 2 (two) times daily.     ramipril 2.5 MG capsule  Commonly known as:  ALTACE  Take 1 capsule (2.5 mg total) by mouth daily.     simvastatin 20 MG tablet  Commonly known as:  ZOCOR  Take 1 tablet (20 mg total) by mouth at bedtime.         SignedOphelia Charter 07/21/2014, 12:11 PM

## 2014-07-21 NOTE — Progress Notes (Signed)
Dr. Arnoldo Morale told pt that he would be discharged today. Orders have not been entered into the computer at this time. Pt asked RN to call Dr. Arnoldo Morale to see when those orders would be entered. MD's office has been called, and his nurse is paging him now. Awaiting discharge orders now. Pt aware.

## 2014-07-21 NOTE — Progress Notes (Signed)
Occupational Therapy Evaluation and Discharge Patient Details Name: Rodney Powers MRN: [id number] DOB: 1956-09-16 Today's Date: 07/21/2014    History of Present Illness Pt is a 58 year old male admitted from home due to syncope episodes and found to have multiple brain metastasis on MRI with hx of lung cancer s/p treatment and dysphagia, patient is now s/p tumor resection.   Clinical Impression   PTA pt lived at home and was independent with ADLs and functional mobility. Pt is currently at [place] level with ADLs, however is limited by Rt eye edema which decreases his visual field. Educated pt on compensatory strategies due to decreased vision and safety with functional mobility and community mobility. Pt has no further acute OT needs.      Follow Up Recommendations  No OT follow up;Supervision/Assistance - 24 hour    Equipment Recommendations  None recommended by OT       Precautions / Restrictions Precautions Precautions: Fall Precaution Comments: educated patient regarding mobility with visual deficits Restrictions Weight Bearing Restrictions: No      Mobility Bed Mobility Overal bed mobility: Modified Independent                Transfers Overall transfer level: Modified independent                         ADL Overall ADL's : Modified independent                                       General ADL Comments: Pt able to ambulate around room to perform ADL activites with min VC's to locate items due to R eye edema. Educated pt on safety with ADLs and IADLs, including community mobility, due to decreased vision. Recommended that pt have a friend/family member walk on his right side when out in the community to protect him from hazards.      Vision  Vision assessment completed, Pt's vision is impaired (see comments below).                Additional Comments: Pt Left eye WNL for vision screen; R eye has edema limiting pt's  ability to open eye. With minimal opening, pt reports blurriness. Encouraged pt to ice eye and educated pt on safety with functional mobility due to decreased visual field.    Perception Perception Perception Tested?: No   Praxis Praxis Praxis tested?: Within functional limits    Pertinent Vitals/Pain Pain Assessment: 0-10 Pain Score: 0-No pain     Hand Dominance Right   Extremity/Trunk Assessment Upper Extremity Assessment Upper Extremity Assessment: Overall WFL for tasks assessed   Lower Extremity Assessment Lower Extremity Assessment: Overall WFL for tasks assessed   Cervical / Trunk Assessment Cervical / Trunk Assessment: Normal   Communication Communication Communication: No difficulties   Cognition Arousal/Alertness: Awake/alert Behavior During Therapy: WFL for tasks assessed/performed Overall Cognitive Status: Within Functional Limits for tasks assessed                                Home Living Family/patient expects to be discharged to:: Private residence Living Arrangements: Spouse/significant other;Non-relatives/Friends (friend) Available Help at Discharge: Family;Available 24 hours/day Type of Home: House Home Access: Stairs to enter [employer] of Steps: 4 Entrance Stairs-Rails: Can reach both Home Layout: One level  Bathroom Shower/Tub: Advertising copywriter: Yes How Accessible: Accessible via walker Home Equipment: None          Prior Functioning/Environment Level of Independence: Independent                                       End of Session  Activity Tolerance: Patient tolerated treatment well Patient left: in bed;with call bell/phone within reach   Time: 0855-0912 OT Time Calculation (min): 17 min Charges:  OT General Charges $OT Visit: 1 Procedure OT Evaluation $Initial OT Evaluation Tier I: 1 Procedure OT Treatments $Self Care/Home Management :  8-22 mins  Juluis Rainier [phone] 07/21/2014, 9:23 AM

## 2014-07-21 NOTE — Clinical Documentation Improvement (Signed)
Possible Clinical Conditions? Cerebral Edema Brain Herniation  Other Condition Cannot Clinically Determine   Supporting Information: Risk Factors:(As per notes) Lung Ca with Brain Mets Diagnostics: MR Brain on 07/18/14: IMPRESSION: 1. Two brain metastases affecting the right hemisphere are stable since 07/13/2014. No new brain metastasis identified. 2. Stable associated cerebral edema and mass effect  Treatment: - Keppra 500mg  BID  - Dexamethasone 4mg  q6 hrs  - Pt will likely need resection of the two temporal lesions with preoperative SRS a few days prior, which would be done at [facility].   Thank You, Alessandra Grout, RN, BSN, CCDS,Clinical Documentation Specialist:  [phone]  [phone]=Cell [place]- Health Information Management

## 2014-07-25 ENCOUNTER — Ambulatory Visit: Payer: Medicaid Other | Admitting: Hematology and Oncology

## 2014-07-25 ENCOUNTER — Other Ambulatory Visit: Payer: Self-pay | Admitting: *Deleted

## 2014-07-26 ENCOUNTER — Encounter: Payer: Self-pay | Admitting: *Deleted

## 2014-07-26 ENCOUNTER — Telehealth: Payer: Self-pay | Admitting: Hematology and Oncology

## 2014-07-26 ENCOUNTER — Other Ambulatory Visit: Payer: Self-pay | Admitting: Radiation Therapy

## 2014-07-26 DIAGNOSIS — C7931 Secondary malignant neoplasm of brain: Secondary | ICD-10-CM

## 2014-07-26 DIAGNOSIS — C7949 Secondary malignant neoplasm of other parts of nervous system: Principal | ICD-10-CM

## 2014-07-26 NOTE — Telephone Encounter (Signed)
Pt aware r/s apt per 08/11 POF, ok to doublebook pt per Dr. Alvy Bimler....kj

## 2014-07-31 ENCOUNTER — Other Ambulatory Visit: Payer: Self-pay | Admitting: Hematology and Oncology

## 2014-07-31 DIAGNOSIS — C3490 Malignant neoplasm of unspecified part of unspecified bronchus or lung: Secondary | ICD-10-CM

## 2014-07-31 DIAGNOSIS — C7931 Secondary malignant neoplasm of brain: Secondary | ICD-10-CM

## 2014-07-31 NOTE — Progress Notes (Signed)
  Radiation Oncology         [phone] ________________________________  Name: Rodney Powers MRN: [id number]  Date: 07/17/2014  DOB: 10-24-1956  End of Treatment Note  Diagnosis:   58 yo man with 2 right temporal brain metastases from non-small cell lung cancer - stage IV  Indication for treatment:  Palliation, Pre-Op SRS       Radiation treatment dates:   07/17/2014  Site/dose:   He received stereotactic radiosurgery to the following targets:  1.  Right posterior temporal 2.7 cm target was treated using 4 Dynamic Conformal Arcs to a prescription dose of 14 Gy. ExacTrac registration was performed for each couch angle. The 84% isodose line was prescribed.  2.  Right anterior temporal 1.7 cm target was treated using 4 Dynamic Conformal Arcs to a prescription dose of 20 Gy. ExacTrac registration was performed for each couch angle. The 82.6% isodose line was prescribed.  Beams/energy:   6 MV X-rays were used for all fields in the flattening filter free beam mode.  Narrative: The patient tolerated radiation treatment relatively well.     Plan: The patient has completed radiation treatment. He will proceed with resection of both treated metastases in 48 hours.  The patient will return to radiation oncology clinic for routine followup in one month. I advised him to call or return sooner if he has any questions or concerns related to his recovery or treatment. ________________________________  Sheral Apley. Tammi Klippel, M.D.

## 2014-08-01 ENCOUNTER — Ambulatory Visit (HOSPITAL_BASED_OUTPATIENT_CLINIC_OR_DEPARTMENT_OTHER): Payer: Medicaid Other | Admitting: Hematology and Oncology

## 2014-08-01 ENCOUNTER — Encounter: Payer: Self-pay | Admitting: Hematology and Oncology

## 2014-08-01 ENCOUNTER — Ambulatory Visit (HOSPITAL_COMMUNITY): Payer: Medicaid Other

## 2014-08-01 ENCOUNTER — Telehealth: Payer: Self-pay | Admitting: Hematology and Oncology

## 2014-08-01 ENCOUNTER — Telehealth: Payer: Self-pay | Admitting: *Deleted

## 2014-08-01 VITALS — BP 136/80 | HR 93 | Temp 97.7°F | Resp 18 | Ht 75.0 in | Wt 228.8 lb

## 2014-08-01 DIAGNOSIS — F17200 Nicotine dependence, unspecified, uncomplicated: Secondary | ICD-10-CM

## 2014-08-01 DIAGNOSIS — Z720 Tobacco use: Secondary | ICD-10-CM

## 2014-08-01 DIAGNOSIS — C7949 Secondary malignant neoplasm of other parts of nervous system: Secondary | ICD-10-CM

## 2014-08-01 DIAGNOSIS — C7931 Secondary malignant neoplasm of brain: Secondary | ICD-10-CM

## 2014-08-01 NOTE — Assessment & Plan Note (Signed)
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 His most recent brain metastasis shows some foci of adenocarcinoma. It is unclear to me whether this represent metastatic cancer from original disease or a new primary. I will contact the pathologist to clarify further. In the meantime, I plan to order a CT scan of the chest, abdomen and pelvis with bone scan for staging purposes.

## 2014-08-01 NOTE — Assessment & Plan Note (Signed)
I spent some time counseling the patient the importance of tobacco cessation. he is currently attempting to quit on his own  I gave him patient education handout and encouraged him to sign up for smoking cessation class.

## 2014-08-01 NOTE — Telephone Encounter (Signed)
gv adn printed appt scehd and avs for pt for Aug

## 2014-08-01 NOTE — Telephone Encounter (Signed)
Pt states he can come today at 11:30 am.

## 2014-08-01 NOTE — Progress Notes (Signed)
[place] OFFICE PROGRESS NOTE  Patient Care Team: Lorayne Marek, MD as PCP - General (Internal Medicine) Rigoberto Noel, MD as Attending Physician (Pulmonary Disease) Louellen Molder, MD as Attending Physician (Internal Medicine) Heath Lark, MD as Consulting Physician (Hematology and Oncology)  SUMMARY OF ONCOLOGIC HISTORY:   Metastatic adenocarcinoma to brain   03/15/2013 Surgery Biopsy of mediastinum mass show poorly differentiated carcinoma, suspicious for squamous cell carcinoma   03/15/2013 Imaging PET scan showed a large hypermetabolic mass centered over the prevascular lymph node region and extending into the left upper lobe. This is concerning for bronchogenic carcinoma   04/18/2013 - 05/27/2013 Radiation Therapy He received concurrent chemoradiation treatment   04/18/2013 - 05/27/2013 Chemotherapy He received weekly carboplatin and Taxol with radiation treatment   07/01/2013 Surgery He had EGD, biopsy, laryngoscopy and tonsillectomy which showed no evidence of cancer   08/05/2013 Imaging Repeat CT scan showed marked interval response to therapy    02/22/2014 Imaging CT scan showed minimal progression of radiation changes in the left lung with increase in trace left-sided pleural fluid. Slight decrease can soft tissue fullness    06/22/2014 Imaging CT scan of the abdomen showed no evidence of cancer   07/13/2014 - 07/15/2014 Hospital Admission He was admitted to the hospital of the syncopal episode. Imaging studies showed metastatic cancer to the brain. Radiation oncologist and neurosurgeon work consult for local therapy.   07/13/2014 Imaging MRI brain showed two new enhancing right temporal lobe lesions, consistent with metastases. Extensive vasogenic edema results an 10 mm midline shift   07/17/2014 - 07/17/2014 Radiation Therapy He received one dose of stereotactic radiosurgery    INTERVAL HISTORY: Please see below for problem oriented charting. He missed his appointment last  week. His wound is healing well. Denies any recent headache or seizures. His pain appeared to be well controlled. Patient continues to smoke REVIEW OF SYSTEMS:   Constitutional: Denies fevers, chills or abnormal weight loss Eyes: Denies blurriness of vision Ears, nose, mouth, throat, and face: Denies mucositis or sore throat Respiratory: Denies cough, dyspnea or wheezes Cardiovascular: Denies palpitation, chest discomfort or lower extremity swelling Gastrointestinal:  Denies nausea, heartburn or change in bowel habits Skin: Denies abnormal skin rashes Lymphatics: Denies new lymphadenopathy or easy bruising Neurological:Denies numbness, tingling or new weaknesses Behavioral/Psych: Mood is stable, no new changes  All other systems were reviewed with the patient and are negative.  I have reviewed the past medical history, past surgical history, social history and family history with the patient and they are unchanged from previous note.  ALLERGIES:  has No Known Allergies.  MEDICATIONS:  Current Outpatient Prescriptions  Medication Sig Dispense Refill  . docusate sodium 100 MG CAPS Take 100 mg by mouth 2 (two) times daily.  60 capsule  0  . gabapentin (NEURONTIN) 400 MG capsule Take 800 mg by mouth 3 (three) times daily.      Marland Kitchen glimepiride (AMARYL) 4 MG tablet Take 4 mg by mouth daily with breakfast.      . HYDROcodone-acetaminophen (NORCO/VICODIN) 5-325 MG per tablet Take 1-2 tablets by mouth every 6 (six) hours as needed for moderate pain.      . Lancets (ACCU-CHEK MULTICLIX) lancets Use as instructed  100 each  0  . ramipril (ALTACE) 2.5 MG capsule Take 1 capsule (2.5 mg total) by mouth daily.  90 capsule  3  . simvastatin (ZOCOR) 20 MG tablet Take 1 tablet (20 mg total) by mouth at bedtime.  90 tablet  3  No current facility-administered medications for this visit.    PHYSICAL EXAMINATION: ECOG PERFORMANCE STATUS: 0 - Asymptomatic  Filed Vitals:   08/01/14 1132  BP: 136/80   Pulse: 93  Temp: 97.7 F (36.5 C)  Resp: 18   Filed Weights   08/01/14 1132  Weight: 228 lb 12.8 oz (103.783 kg)    GENERAL:alert, no distress and comfortable SKIN: skin color, texture, turgor are normal, no rashes or significant lesions. Well-healed surgical scar on his head EYES: normal, Conjunctiva are pink and non-injected, sclera clear OROPHARYNX:no exudate, no erythema and lips, buccal mucosa, and tongue normal  NECK: supple, thyroid normal size, non-tender, without nodularity LYMPH:  no palpable lymphadenopathy in the cervical, axillary or inguinal LUNGS: clear to auscultation and percussion with normal breathing effort HEART: regular rate & rhythm and no murmurs and no lower extremity edema ABDOMEN:abdomen soft, non-tender and normal bowel sounds Musculoskeletal:no cyanosis of digits and no clubbing  NEURO: alert & oriented x 3 with fluent speech, no focal motor/sensory deficits  LABORATORY DATA:  I have reviewed the data as listed    Component Value Date/Time   NA 139 07/20/2014 0500   NA 143 03/07/2014 1206   K 4.4 07/20/2014 0500   K 4.2 03/07/2014 1206   CL 105 07/20/2014 0500   CL 104 06/06/2013 0841   CO2 20 07/20/2014 0500   CO2 24 03/07/2014 1206   GLUCOSE 186* 07/20/2014 0500   GLUCOSE 164* 03/07/2014 1206   GLUCOSE 258* 06/06/2013 0841   BUN 16 07/20/2014 0500   BUN 11.6 03/07/2014 1206   CREATININE 0.96 07/20/2014 0500   CREATININE 1.3 03/07/2014 1206   CREATININE 0.94 12/29/2013 0932   CALCIUM 8.4 07/20/2014 0500   CALCIUM 9.8 03/07/2014 1206   PROT 7.3 07/14/2014 0414   PROT 7.4 03/07/2014 1206   ALBUMIN 3.5 07/14/2014 0414   ALBUMIN 3.7 03/07/2014 1206   AST 15 07/14/2014 0414   AST 16 03/07/2014 1206   ALT 15 07/14/2014 0414   ALT 13 03/07/2014 1206   ALKPHOS 84 07/14/2014 0414   ALKPHOS 91 03/07/2014 1206   BILITOT 0.3 07/14/2014 0414   BILITOT 0.28 03/07/2014 1206   GFRNONAA >90 07/20/2014 0500   GFRNONAA >89 12/29/2013 0932   GFRAA >90 07/20/2014 0500   GFRAA >89 12/29/2013  0932    No results found for this basename: SPEP,  UPEP,   kappa and lambda light chains    Lab Results  Component Value Date   WBC 10.6* 07/20/2014   NEUTROABS 2.9 07/13/2014   HGB 13.8 07/20/2014   HCT 43.0 07/20/2014   MCV 84.8 07/20/2014   PLT 168 07/20/2014      Chemistry      Component Value Date/Time   NA 139 07/20/2014 0500   NA 143 03/07/2014 1206   K 4.4 07/20/2014 0500   K 4.2 03/07/2014 1206   CL 105 07/20/2014 0500   CL 104 06/06/2013 0841   CO2 20 07/20/2014 0500   CO2 24 03/07/2014 1206   BUN 16 07/20/2014 0500   BUN 11.6 03/07/2014 1206   CREATININE 0.96 07/20/2014 0500   CREATININE 1.3 03/07/2014 1206   CREATININE 0.94 12/29/2013 0932      Component Value Date/Time   CALCIUM 8.4 07/20/2014 0500   CALCIUM 9.8 03/07/2014 1206   ALKPHOS 84 07/14/2014 0414   ALKPHOS 91 03/07/2014 1206   AST 15 07/14/2014 0414   AST 16 03/07/2014 1206   ALT 15 07/14/2014 0414   ALT 13  03/07/2014 1206   BILITOT 0.3 07/14/2014 0414   BILITOT 0.28 03/07/2014 1206       RADIOGRAPHIC STUDIES: I have reviewed his most recent imaging I have personally reviewed the radiological images as listed and agreed with the findings in the report.  ASSESSMENT & PLAN:  Metastatic adenocarcinoma to brain His prior biopsy showed poorly differentiated carcinoma suggestive of squamous cell morphology. His most recent brain metastasis shows some foci of adenocarcinoma. It is unclear to me whether this represent metastatic cancer from original disease or a new primary. I will contact the pathologist to clarify further. In the meantime, I plan to order a CT scan of the chest, abdomen and pelvis with bone scan for staging purposes.  Tobacco abuse I spent some time counseling the patient the importance of tobacco cessation. he is currently attempting to quit on his own  I gave him patient education handout and encouraged him to sign up for smoking cessation class.     No orders of the defined types were placed in this  encounter.   All questions were answered. The patient knows to call the clinic with any problems, questions or concerns. No barriers to learning was detected. I spent 40 minutes counseling the patient face to face. The total time spent in the appointment was 55 minutes and more than 50% was on counseling and review of test results     [facility], [place], MD 08/01/2014 8:08 PM

## 2014-08-01 NOTE — Telephone Encounter (Signed)
Message copied by Cathlean Cower on Tue Aug 01, 2014  8:32 AM ------      Message from: [facility], [place]: Mon Jul 31, 2014  4:50 PM      Regarding: appt tomorrow       He did not show last week. Please call him in the morning and see if he can come early around 1130 am.      Thanks ------

## 2014-08-02 ENCOUNTER — Encounter (HOSPITAL_COMMUNITY): Payer: Self-pay

## 2014-08-04 ENCOUNTER — Ambulatory Visit (HOSPITAL_COMMUNITY)
Admission: RE | Admit: 2014-08-04 | Discharge: 2014-08-04 | Disposition: A | Discharge status: Home / Self Care | Payer: Medicaid Other | Source: Ambulatory Visit | Attending: Hematology and Oncology | Admitting: Hematology and Oncology

## 2014-08-04 ENCOUNTER — Encounter (HOSPITAL_COMMUNITY): Payer: Self-pay

## 2014-08-04 ENCOUNTER — Encounter (HOSPITAL_COMMUNITY)
Admission: RE | Admit: 2014-08-04 | Discharge: 2014-08-04 | Disposition: A | Discharge status: Home / Self Care | Payer: Medicaid Other | Source: Ambulatory Visit | Attending: Hematology and Oncology | Admitting: Hematology and Oncology

## 2014-08-04 DIAGNOSIS — J438 Other emphysema: Secondary | ICD-10-CM | POA: Diagnosis not present

## 2014-08-04 DIAGNOSIS — C7949 Secondary malignant neoplasm of other parts of nervous system: Secondary | ICD-10-CM

## 2014-08-04 DIAGNOSIS — C3490 Malignant neoplasm of unspecified part of unspecified bronchus or lung: Secondary | ICD-10-CM | POA: Insufficient documentation

## 2014-08-04 DIAGNOSIS — C7931 Secondary malignant neoplasm of brain: Secondary | ICD-10-CM | POA: Insufficient documentation

## 2014-08-04 MED ORDER — TECHNETIUM TC 99M MEDRONATE IV KIT
26.6000 | PACK | Freq: Once | INTRAVENOUS | Status: AC | PRN
  Administered 2014-08-04: 26.6 via INTRAVENOUS

## 2014-08-04 MED ORDER — IOHEXOL 300 MG/ML  SOLN
80.0000 mL | Freq: Once | INTRAMUSCULAR | Status: AC | PRN
  Administered 2014-08-04: 80 mL via INTRAVENOUS

## 2014-08-08 ENCOUNTER — Telehealth: Payer: Self-pay | Admitting: Hematology and Oncology

## 2014-08-08 ENCOUNTER — Ambulatory Visit (HOSPITAL_BASED_OUTPATIENT_CLINIC_OR_DEPARTMENT_OTHER): Payer: Medicaid Other | Admitting: Hematology and Oncology

## 2014-08-08 ENCOUNTER — Encounter: Payer: Self-pay | Admitting: Hematology and Oncology

## 2014-08-08 VITALS — BP 150/86 | HR 72 | Temp 98.8°F | Resp 17 | Ht 75.0 in | Wt 232.5 lb

## 2014-08-08 DIAGNOSIS — C3410 Malignant neoplasm of upper lobe, unspecified bronchus or lung: Secondary | ICD-10-CM

## 2014-08-08 DIAGNOSIS — C3490 Malignant neoplasm of unspecified part of unspecified bronchus or lung: Secondary | ICD-10-CM

## 2014-08-08 DIAGNOSIS — F17200 Nicotine dependence, unspecified, uncomplicated: Secondary | ICD-10-CM

## 2014-08-08 DIAGNOSIS — E1140 Type 2 diabetes mellitus with diabetic neuropathy, unspecified: Secondary | ICD-10-CM

## 2014-08-08 DIAGNOSIS — Z720 Tobacco use: Secondary | ICD-10-CM

## 2014-08-08 DIAGNOSIS — G620 Drug-induced polyneuropathy: Secondary | ICD-10-CM

## 2014-08-08 DIAGNOSIS — E1142 Type 2 diabetes mellitus with diabetic polyneuropathy: Secondary | ICD-10-CM

## 2014-08-08 DIAGNOSIS — C7931 Secondary malignant neoplasm of brain: Secondary | ICD-10-CM

## 2014-08-08 DIAGNOSIS — C7949 Secondary malignant neoplasm of other parts of nervous system: Secondary | ICD-10-CM

## 2014-08-08 MED ORDER — LIDOCAINE-PRILOCAINE 2.5-2.5 % EX CREA: 1.0000 "application " | TOPICAL_CREAM | CUTANEOUS | Status: DC | PRN

## 2014-08-08 NOTE — Assessment & Plan Note (Signed)
He has painful peripheral neuropathy, likely exacerbated by poorly controlled diabetes and prior chemotherapy. I will do dose modification for future treatment.

## 2014-08-08 NOTE — Telephone Encounter (Signed)
gv and printed appt sched and avs for pt for Aug adn Sept...sed added tx.

## 2014-08-08 NOTE — Assessment & Plan Note (Signed)
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 His most recent brain metastasis shows some foci of adenocarcinoma. It is unclear to me whether this represent metastatic cancer from original disease or a new primary. I am waiting for final report from the pathologist. Interestingly, repeat staging scans show regression of original tumor. I discussed with the patient the role of palliative chemotherapy as the form of adjuvant treatment given recent diagnosis of metastatic stage IV cancer to the brain. He is interested to proceed. I will order port placement, chemotherapy education class and repeat blood work. I will see him back in the near future we'll plan to start him on chemotherapy with carboplatin/Abraxane. I will probably do dose modification given his significant peripheral neuropathy from prior treatment.

## 2014-08-08 NOTE — Progress Notes (Signed)
[place] OFFICE PROGRESS NOTE  Patient Care Team: Lorayne Marek, MD as PCP - General (Internal Medicine) Rigoberto Noel, MD as Attending Physician (Pulmonary Disease) Louellen Molder, MD as Attending Physician (Internal Medicine) Heath Lark, MD as Consulting Physician (Hematology and Oncology)  SUMMARY OF ONCOLOGIC HISTORY:   Metastatic adenocarcinoma to brain   03/15/2013 Surgery Biopsy of mediastinum mass show poorly differentiated carcinoma, suspicious for squamous cell carcinoma   03/15/2013 Imaging PET scan showed a large hypermetabolic mass centered over the prevascular lymph node region and extending into the left upper lobe. This is concerning for bronchogenic carcinoma   04/18/2013 - 05/27/2013 Radiation Therapy He received concurrent chemoradiation treatment   04/18/2013 - 05/27/2013 Chemotherapy He received weekly carboplatin and Taxol with radiation treatment   07/01/2013 Surgery He had EGD, biopsy, laryngoscopy and tonsillectomy which showed no evidence of cancer   08/05/2013 Imaging Repeat CT scan showed marked interval response to therapy    02/22/2014 Imaging CT scan showed minimal progression of radiation changes in the left lung with increase in trace left-sided pleural fluid. Slight decrease can soft tissue fullness    06/22/2014 Imaging CT scan of the abdomen showed no evidence of cancer   07/13/2014 - 07/15/2014 Hospital Admission He was admitted to the hospital of the syncopal episode. Imaging studies showed metastatic cancer to the brain. Radiation oncologist and neurosurgeon work consult for local therapy.   07/13/2014 Imaging MRI brain showed two new enhancing right temporal lobe lesions, consistent with metastases. Extensive vasogenic edema results an 10 mm midline shift   07/17/2014 - 07/17/2014 Radiation Therapy He received one dose of stereotactic radiosurgery    INTERVAL HISTORY: Please see below for problem oriented charting. I reviewed test result with him. All  the stitches and staples were removed from the scalp. Denies recent seizures.  REVIEW OF SYSTEMS:   Constitutional: Denies fevers, chills or abnormal weight loss Eyes: Denies blurriness of vision Ears, nose, mouth, throat, and face: Denies mucositis or sore throat Respiratory: Denies cough, dyspnea or wheezes Cardiovascular: Denies palpitation, chest discomfort or lower extremity swelling Gastrointestinal:  Denies nausea, heartburn or change in bowel habits Skin: Denies abnormal skin rashes Lymphatics: Denies new lymphadenopathy or easy bruising Neurological:Denies numbness, tingling or new weaknesses Behavioral/Psych: Mood is stable, no new changes  All other systems were reviewed with the patient and are negative.  I have reviewed the past medical history, past surgical history, social history and family history with the patient and they are unchanged from previous note.  ALLERGIES:  has No Known Allergies.  MEDICATIONS:  Current Outpatient Prescriptions  Medication Sig Dispense Refill  . gabapentin (NEURONTIN) 400 MG capsule Take 800 mg by mouth 3 (three) times daily.      Marland Kitchen glimepiride (AMARYL) 4 MG tablet Take 4 mg by mouth daily with breakfast.      . Lancets (ACCU-CHEK MULTICLIX) lancets Use as instructed  100 each  0  . OxyCODONE (OXYCONTIN) 10 mg T12A 12 hr tablet Take 10 mg by mouth every 12 (twelve) hours.      . ramipril (ALTACE) 2.5 MG capsule Take 1 capsule (2.5 mg total) by mouth daily.  90 capsule  3  . simvastatin (ZOCOR) 20 MG tablet Take 1 tablet (20 mg total) by mouth at bedtime.  90 tablet  3  . docusate sodium 100 MG CAPS Take 100 mg by mouth 2 (two) times daily.  60 capsule  0  . HYDROcodone-acetaminophen (NORCO/VICODIN) 5-325 MG per tablet Take 1-2 tablets  by mouth every 6 (six) hours as needed for moderate pain.      Marland Kitchen lidocaine-prilocaine (EMLA) cream Apply 1 application topically as needed.  30 g  6   No current facility-administered medications for this  visit.    PHYSICAL EXAMINATION: ECOG PERFORMANCE STATUS: 0 - Asymptomatic  Filed Vitals:   08/08/14 1250  BP: 150/86  Pulse: 72  Temp: 98.8 F (37.1 C)  Resp: 17   Filed Weights   08/08/14 1250  Weight: 232 lb 8 oz (105.461 kg)    GENERAL:alert, no distress and comfortable SKIN: skin color, texture, turgor are normal, no rashes or significant lesions. Well-healed surgical scar EYES: normal, Conjunctiva are pink and non-injected, sclera clear OROPHARYNX:no exudate, no erythema and lips, buccal mucosa, and tongue normal  NECK: supple, thyroid normal size, non-tender, without nodularity LYMPH:  no palpable lymphadenopathy in the cervical, axillary or inguinal LUNGS: clear to auscultation and percussion with normal breathing effort HEART: regular rate & rhythm and no murmurs and no lower extremity edema ABDOMEN:abdomen soft, non-tender and normal bowel sounds Musculoskeletal:no cyanosis of digits and no clubbing  NEURO: alert & oriented x 3 with fluent speech, no focal motor/sensory deficits  LABORATORY DATA:  I have reviewed the data as listed    Component Value Date/Time   NA 139 07/20/2014 0500   NA 143 03/07/2014 1206   K 4.4 07/20/2014 0500   K 4.2 03/07/2014 1206   CL 105 07/20/2014 0500   CL 104 06/06/2013 0841   CO2 20 07/20/2014 0500   CO2 24 03/07/2014 1206   GLUCOSE 186* 07/20/2014 0500   GLUCOSE 164* 03/07/2014 1206   GLUCOSE 258* 06/06/2013 0841   BUN 16 07/20/2014 0500   BUN 11.6 03/07/2014 1206   CREATININE 0.96 07/20/2014 0500   CREATININE 1.3 03/07/2014 1206   CREATININE 0.94 12/29/2013 0932   CALCIUM 8.4 07/20/2014 0500   CALCIUM 9.8 03/07/2014 1206   PROT 7.3 07/14/2014 0414   PROT 7.4 03/07/2014 1206   ALBUMIN 3.5 07/14/2014 0414   ALBUMIN 3.7 03/07/2014 1206   AST 15 07/14/2014 0414   AST 16 03/07/2014 1206   ALT 15 07/14/2014 0414   ALT 13 03/07/2014 1206   ALKPHOS 84 07/14/2014 0414   ALKPHOS 91 03/07/2014 1206   BILITOT 0.3 07/14/2014 0414   BILITOT 0.28 03/07/2014 1206    GFRNONAA >90 07/20/2014 0500   GFRNONAA >89 12/29/2013 0932   GFRAA >90 07/20/2014 0500   GFRAA >89 12/29/2013 0932    No results found for this basename: SPEP, UPEP,  kappa and lambda light chains    Lab Results  Component Value Date   WBC 10.6* 07/20/2014   NEUTROABS 2.9 07/13/2014   HGB 13.8 07/20/2014   HCT 43.0 07/20/2014   MCV 84.8 07/20/2014   PLT 168 07/20/2014      Chemistry      Component Value Date/Time   NA 139 07/20/2014 0500   NA 143 03/07/2014 1206   K 4.4 07/20/2014 0500   K 4.2 03/07/2014 1206   CL 105 07/20/2014 0500   CL 104 06/06/2013 0841   CO2 20 07/20/2014 0500   CO2 24 03/07/2014 1206   BUN 16 07/20/2014 0500   BUN 11.6 03/07/2014 1206   CREATININE 0.96 07/20/2014 0500   CREATININE 1.3 03/07/2014 1206   CREATININE 0.94 12/29/2013 0932      Component Value Date/Time   CALCIUM 8.4 07/20/2014 0500   CALCIUM 9.8 03/07/2014 1206   ALKPHOS 84 07/14/2014 0414  ALKPHOS 91 03/07/2014 1206   AST 15 07/14/2014 0414   AST 16 03/07/2014 1206   ALT 15 07/14/2014 0414   ALT 13 03/07/2014 1206   BILITOT 0.3 07/14/2014 0414   BILITOT 0.28 03/07/2014 1206       RADIOGRAPHIC STUDIES: i reviewed CT scan imaging with him. I have personally reviewed the radiological images as listed and agreed with the findings in the report.  ASSESSMENT & PLAN:  Metastatic adenocarcinoma to brain His prior biopsy showed poorly differentiated carcinoma suggestive of squamous cell morphology. His most recent brain metastasis shows some foci of adenocarcinoma. It is unclear to me whether this represent metastatic cancer from original disease or a new primary. I am waiting for final report from the pathologist. Interestingly, repeat staging scans show regression of original tumor. I discussed with the patient the role of palliative chemotherapy as the form of adjuvant treatment given recent diagnosis of metastatic stage IV cancer to the brain. He is interested to proceed. I will order port placement, chemotherapy  education class and repeat blood work. I will see him back in the near future we'll plan to start him on chemotherapy with carboplatin/Abraxane. I will probably do dose modification given his significant peripheral neuropathy from prior treatment.   Tobacco abuse I spent some time counseling the patient the importance of tobacco cessation. he is currently attempting to quit on his own  I gave him patient education handout and encouraged him to sign up for smoking cessation class.     Diabetic neuropathy, painful He has painful peripheral neuropathy, likely exacerbated by poorly controlled diabetes and prior chemotherapy. I will do dose modification for future treatment.   Orders Placed This Encounter  Procedures  . IR Fluoro Guide CV Line Right    Indicate type of CVC ordering    Standing Status: Future     Number of Occurrences:      Standing Expiration Date: 10/09/2015    Order Specific Question:  Reason for exam:    Answer:  port for chemo    Order Specific Question:  Preferred Imaging Location?    Answer:  [facility]   All questions were answered. The patient knows to call the clinic with any problems, questions or concerns. No barriers to learning was detected. I spent 30 minutes counseling the patient face to face. The total time spent in the appointment was 40 minutes and more than 50% was on counseling and review of test results     [facility], [place], MD 08/08/2014 4:40 PM

## 2014-08-08 NOTE — Assessment & Plan Note (Signed)
I spent some time counseling the patient the importance of tobacco cessation. he is currently attempting to quit on his own  I gave him patient education handout and encouraged him to sign up for smoking cessation class.

## 2014-08-10 ENCOUNTER — Other Ambulatory Visit: Payer: Medicaid Other

## 2014-08-14 ENCOUNTER — Other Ambulatory Visit: Payer: Self-pay | Admitting: Internal Medicine

## 2014-08-14 ENCOUNTER — Other Ambulatory Visit: Payer: Self-pay

## 2014-08-14 ENCOUNTER — Telehealth: Payer: Self-pay | Admitting: Oncology

## 2014-08-14 ENCOUNTER — Telehealth: Payer: Self-pay | Admitting: Internal Medicine

## 2014-08-14 DIAGNOSIS — E119 Type 2 diabetes mellitus without complications: Secondary | ICD-10-CM

## 2014-08-14 MED ORDER — GABAPENTIN 400 MG PO CAPS: 800.0000 mg | ORAL_CAPSULE | Freq: Three times a day (TID) | ORAL | Status: DC

## 2014-08-14 MED ORDER — ACCU-CHEK MULTICLIX LANCETS MISC: Status: DC

## 2014-08-14 MED ORDER — GLUCOSE BLOOD VI STRP: ORAL_STRIP | Status: DC

## 2014-08-14 NOTE — Telephone Encounter (Signed)
Pt. Walked in requesting refills for  gabapentin (NEURONTIN) 400 MG capsule [[id number]],[id number] (ACCU-CHEK MULTICLIX) lancets [[id number]]...Pt. Is almost out of both and would like to get them today if possible

## 2014-08-14 NOTE — Telephone Encounter (Signed)
Called in refill to [place] per Dr. Sondra Come for sucralfate (CARAFATE) 1 GM/10ML suspension 08/14/2014 Sig - Route: Take 1 g by mouth 4 (four) times daily - with meals and at bedtime. Take 10 mls by mouth four times daily. Disp. 420 ml. 0 refills.

## 2014-08-17 ENCOUNTER — Other Ambulatory Visit: Payer: Self-pay | Admitting: Radiology

## 2014-08-17 ENCOUNTER — Encounter (HOSPITAL_COMMUNITY): Payer: Self-pay | Admitting: Pharmacy Technician

## 2014-08-18 ENCOUNTER — Other Ambulatory Visit: Payer: Self-pay | Admitting: Radiology

## 2014-08-22 ENCOUNTER — Ambulatory Visit (HOSPITAL_COMMUNITY)
Admission: RE | Admit: 2014-08-22 | Discharge: 2014-08-22 | Disposition: A | Discharge status: Home / Self Care | Payer: Medicaid Other | Source: Ambulatory Visit | Attending: Hematology and Oncology | Admitting: Hematology and Oncology

## 2014-08-22 ENCOUNTER — Encounter (HOSPITAL_COMMUNITY): Payer: Self-pay | Admitting: Pharmacy Technician

## 2014-08-22 ENCOUNTER — Telehealth: Payer: Self-pay | Admitting: *Deleted

## 2014-08-22 DIAGNOSIS — C3490 Malignant neoplasm of unspecified part of unspecified bronchus or lung: Secondary | ICD-10-CM

## 2014-08-22 DIAGNOSIS — Z5309 Procedure and treatment not carried out because of other contraindication: Secondary | ICD-10-CM | POA: Insufficient documentation

## 2014-08-22 DIAGNOSIS — Z452 Encounter for adjustment and management of vascular access device: Secondary | ICD-10-CM | POA: Diagnosis present

## 2014-08-22 DIAGNOSIS — C7931 Secondary malignant neoplasm of brain: Secondary | ICD-10-CM | POA: Insufficient documentation

## 2014-08-22 DIAGNOSIS — C7949 Secondary malignant neoplasm of other parts of nervous system: Secondary | ICD-10-CM | POA: Diagnosis not present

## 2014-08-22 MED ORDER — CEFAZOLIN SODIUM-DEXTROSE 2-3 GM-% IV SOLR
2.0000 g | Freq: Once | INTRAVENOUS | Status: DC
Start: 2014-08-22 — End: 2014-08-23

## 2014-08-22 MED ORDER — SODIUM CHLORIDE 0.9 % IV SOLN: INTRAVENOUS | Status: DC

## 2014-08-22 NOTE — Telephone Encounter (Signed)
Tiffany from IR states pt arrived for his PAC placement today but unfortunately he drank some coffee with cream and sugar about 15 minutes ago.  They will need to r/s his PAC to this Friday.  Pt is aware.  Pt is scheduled to see Dr. Alvy Bimler tomorrow and start chemo w/ carbo/abraxene.   We can try to use peripheral IV for chemo tomorrow.

## 2014-08-22 NOTE — Telephone Encounter (Signed)
OK 

## 2014-08-23 ENCOUNTER — Ambulatory Visit (HOSPITAL_BASED_OUTPATIENT_CLINIC_OR_DEPARTMENT_OTHER): Payer: Medicaid Other

## 2014-08-23 ENCOUNTER — Encounter: Payer: Self-pay | Admitting: Hematology and Oncology

## 2014-08-23 ENCOUNTER — Telehealth: Payer: Self-pay | Admitting: Hematology and Oncology

## 2014-08-23 ENCOUNTER — Ambulatory Visit (HOSPITAL_BASED_OUTPATIENT_CLINIC_OR_DEPARTMENT_OTHER): Payer: Medicaid Other | Admitting: Hematology and Oncology

## 2014-08-23 ENCOUNTER — Telehealth: Payer: Self-pay | Admitting: *Deleted

## 2014-08-23 ENCOUNTER — Other Ambulatory Visit (HOSPITAL_BASED_OUTPATIENT_CLINIC_OR_DEPARTMENT_OTHER): Payer: Medicaid Other

## 2014-08-23 ENCOUNTER — Other Ambulatory Visit: Payer: Self-pay | Admitting: Radiology

## 2014-08-23 VITALS — BP 144/77 | HR 59 | Temp 97.8°F | Resp 18 | Ht 75.0 in | Wt 228.7 lb

## 2014-08-23 DIAGNOSIS — Z23 Encounter for immunization: Secondary | ICD-10-CM

## 2014-08-23 DIAGNOSIS — E1140 Type 2 diabetes mellitus with diabetic neuropathy, unspecified: Secondary | ICD-10-CM

## 2014-08-23 DIAGNOSIS — C3400 Malignant neoplasm of unspecified main bronchus: Secondary | ICD-10-CM

## 2014-08-23 DIAGNOSIS — C3410 Malignant neoplasm of upper lobe, unspecified bronchus or lung: Secondary | ICD-10-CM

## 2014-08-23 DIAGNOSIS — C7949 Secondary malignant neoplasm of other parts of nervous system: Secondary | ICD-10-CM

## 2014-08-23 DIAGNOSIS — C7931 Secondary malignant neoplasm of brain: Secondary | ICD-10-CM

## 2014-08-23 DIAGNOSIS — R05 Cough: Secondary | ICD-10-CM | POA: Insufficient documentation

## 2014-08-23 DIAGNOSIS — E1149 Type 2 diabetes mellitus with other diabetic neurological complication: Secondary | ICD-10-CM

## 2014-08-23 DIAGNOSIS — Z720 Tobacco use: Secondary | ICD-10-CM

## 2014-08-23 DIAGNOSIS — Z5111 Encounter for antineoplastic chemotherapy: Secondary | ICD-10-CM

## 2014-08-23 DIAGNOSIS — R059 Cough, unspecified: Secondary | ICD-10-CM

## 2014-08-23 DIAGNOSIS — F17200 Nicotine dependence, unspecified, uncomplicated: Secondary | ICD-10-CM

## 2014-08-23 DIAGNOSIS — K209 Esophagitis, unspecified without bleeding: Secondary | ICD-10-CM

## 2014-08-23 DIAGNOSIS — Z299 Encounter for prophylactic measures, unspecified: Secondary | ICD-10-CM | POA: Insufficient documentation

## 2014-08-23 DIAGNOSIS — C3490 Malignant neoplasm of unspecified part of unspecified bronchus or lung: Secondary | ICD-10-CM

## 2014-08-23 DIAGNOSIS — E1142 Type 2 diabetes mellitus with diabetic polyneuropathy: Secondary | ICD-10-CM

## 2014-08-23 HISTORY — DX: Cough, unspecified: R05.9

## 2014-08-23 HISTORY — DX: Esophagitis, unspecified without bleeding: K20.90

## 2014-08-23 LAB — CBC WITH DIFFERENTIAL/PLATELET
BASO%: 1.2 % (ref 0.0–2.0)
BASOS ABS: 0.1 10*3/uL (ref 0.0–0.1)
EOS%: 1.5 % (ref 0.0–7.0)
Eosinophils Absolute: 0.1 10*3/uL (ref 0.0–0.5)
HCT: 45 % (ref 38.4–49.9)
HGB: 14.1 g/dL (ref 13.0–17.1)
LYMPH#: 0.8 10*3/uL — AB (ref 0.9–3.3)
LYMPH%: 16.3 % (ref 14.0–49.0)
MCH: 26.5 pg — AB (ref 27.2–33.4)
MCHC: 31.3 g/dL — AB (ref 32.0–36.0)
MCV: 84.7 fL (ref 79.3–98.0)
MONO#: 0.5 10*3/uL (ref 0.1–0.9)
MONO%: 10.9 % (ref 0.0–14.0)
NEUT#: 3.5 10*3/uL (ref 1.5–6.5)
NEUT%: 70.1 % (ref 39.0–75.0)
Platelets: 202 10*3/uL (ref 140–400)
RBC: 5.31 10*6/uL (ref 4.20–5.82)
RDW: 15.6 % — AB (ref 11.0–14.6)
WBC: 5 10*3/uL (ref 4.0–10.3)

## 2014-08-23 LAB — COMPREHENSIVE METABOLIC PANEL (CC13)
ALK PHOS: 79 U/L (ref 40–150)
ALT: 11 U/L (ref 0–55)
AST: 12 U/L (ref 5–34)
Albumin: 3.3 g/dL — ABNORMAL LOW (ref 3.5–5.0)
Anion Gap: 11 mEq/L (ref 3–11)
BUN: 9.9 mg/dL (ref 7.0–26.0)
CALCIUM: 9.2 mg/dL (ref 8.4–10.4)
CHLORIDE: 110 meq/L — AB (ref 98–109)
CO2: 20 mEq/L — ABNORMAL LOW (ref 22–29)
Creatinine: 1.2 mg/dL (ref 0.7–1.3)
Glucose: 187 mg/dl — ABNORMAL HIGH (ref 70–140)
POTASSIUM: 4 meq/L (ref 3.5–5.1)
Sodium: 141 mEq/L (ref 136–145)
Total Bilirubin: 0.36 mg/dL (ref 0.20–1.20)
Total Protein: 7 g/dL (ref 6.4–8.3)

## 2014-08-23 MED ORDER — INFLUENZA VAC SPLIT QUAD 0.5 ML IM SUSY
0.5000 mL | PREFILLED_SYRINGE | Freq: Once | INTRAMUSCULAR | Status: AC
  Administered 2014-08-23: 0.5 mL via INTRAMUSCULAR
  Filled 2014-08-23: qty 0.5

## 2014-08-23 MED ORDER — SODIUM CHLORIDE 0.9 % IV SOLN
Freq: Once | INTRAVENOUS | Status: AC
  Administered 2014-08-23: 09:00:00 via INTRAVENOUS

## 2014-08-23 MED ORDER — SUCRALFATE 1 GM/10ML PO SUSP: 1.0000 g | Freq: Three times a day (TID) | ORAL | Status: AC

## 2014-08-23 MED ORDER — INFLUENZA VAC SPLIT QUAD 0.5 ML IM SUSY
0.5000 mL | PREFILLED_SYRINGE | INTRAMUSCULAR | Status: DC
  Filled 2014-08-23: qty 0.5

## 2014-08-23 MED ORDER — BENZONATATE 100 MG PO CAPS: 100.0000 mg | ORAL_CAPSULE | Freq: Three times a day (TID) | ORAL | Status: DC | PRN

## 2014-08-23 MED ORDER — ONDANSETRON HCL 8 MG PO TABS: 8.0000 mg | ORAL_TABLET | Freq: Three times a day (TID) | ORAL | Status: DC | PRN

## 2014-08-23 MED ORDER — SODIUM CHLORIDE 0.9 % IV SOLN
249.2000 mg | Freq: Once | INTRAVENOUS | Status: AC
  Administered 2014-08-23: 250 mg via INTRAVENOUS
  Filled 2014-08-23: qty 25

## 2014-08-23 MED ORDER — PROCHLORPERAZINE MALEATE 10 MG PO TABS: 10.0000 mg | ORAL_TABLET | Freq: Four times a day (QID) | ORAL | Status: DC | PRN

## 2014-08-23 MED ORDER — DEXAMETHASONE SODIUM PHOSPHATE 20 MG/5ML IJ SOLN
INTRAMUSCULAR | Status: AC
  Filled 2014-08-23: qty 5

## 2014-08-23 MED ORDER — ONDANSETRON 16 MG/50ML IVPB (CHCC)
16.0000 mg | Freq: Once | INTRAVENOUS | Status: AC
  Administered 2014-08-23: 16 mg via INTRAVENOUS

## 2014-08-23 MED ORDER — DEXAMETHASONE SODIUM PHOSPHATE 20 MG/5ML IJ SOLN
20.0000 mg | Freq: Once | INTRAMUSCULAR | Status: AC
  Administered 2014-08-23: 20 mg via INTRAVENOUS

## 2014-08-23 MED ORDER — PACLITAXEL PROTEIN-BOUND CHEMO INJECTION 100 MG
100.0000 mg/m2 | Freq: Once | INTRAVENOUS | Status: AC
  Administered 2014-08-23: 225 mg via INTRAVENOUS
  Filled 2014-08-23: qty 45

## 2014-08-23 MED ORDER — ONDANSETRON 16 MG/50ML IVPB (CHCC)
INTRAVENOUS | Status: AC
  Filled 2014-08-23: qty 16

## 2014-08-23 NOTE — Telephone Encounter (Signed)
Pt confirmed labs/ov per 09/009 POF, sent msg to add chemo, gave pt AVS....KJ

## 2014-08-23 NOTE — Assessment & Plan Note (Signed)
The patient intended to quit smoking after today.

## 2014-08-23 NOTE — Assessment & Plan Note (Signed)
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 Recommend the patient to stop drinking alcohol. I have gave him prescription of Carafate.

## 2014-08-23 NOTE — Progress Notes (Signed)
[facility] OFF[facility] PROGRESS NOTE  Patient Care Team: Lorayne Marek, MD as PCP - General (Internal Medicine) Rigoberto Noel, MD as Attending Physician (Pulmonary Disease) Louellen Molder, MD as Attending Physician (Internal Medicine) Heath Lark, MD as Consulting Physician (Hematology and Oncology)  SUMMARY OF ONCOLOGIC HISTORY:   Metastatic adenocarcinoma to brain   03/15/2013 Surgery Biopsy of mediastinum mass show poorly differentiated carcinoma, suspicious for squamous cell carcinoma   03/15/2013 Imaging PET scan showed a large hypermetabolic mass centered over the prevascular lymph node region and extending into the left upper lobe. This is concerning for bronchogenic carcinoma   04/18/2013 - 05/27/2013 Radiation Therapy He received concurrent chemoradiation treatment   04/18/2013 - 05/27/2013 Chemotherapy He received weekly carboplatin and Taxol with radiation treatment   07/01/2013 Surgery He had EGD, biopsy, laryngoscopy and tonsillectomy which showed no evidence of cancer   08/05/2013 Imaging Repeat CT scan showed marked interval response to therapy    02/22/2014 Imaging CT scan showed minimal progression of radiation changes in the left lung with increase in trace left-sided pleural fluid. Slight decrease can soft tissue fullness    06/22/2014 Imaging CT scan of the abdomen showed no evidence of cancer   07/13/2014 - 07/15/2014 Hospital Admission He was admitted to the hospital of the syncopal episode. Imaging studies showed metastatic cancer to the brain. Radiation oncologist and neurosurgeon work consult for local therapy.   07/13/2014 Imaging MRI brain showed two new enhancing right temporal lobe lesions, consistent with metastases. Extensive vasogenic edema results an 10 mm midline shift   07/17/2014 - 07/17/2014 Radiation Therapy He received one dose of stereotactic radiosurgery    INTERVAL HISTORY: Please see below for problem oriented charting. He is seen today prior to cycle 1 of  treatment. Unfortunately, port was delayed the patient was not fasting. He has very mild nausea occasionally. Denies any headache or seizures or weakness. He is attempting to quit smoking. He has a mild nonproductive cough. He have chronic esophagitis, relieved with Carafate.  REVIEW OF SYSTEMS:   Constitutional: Denies fevers, chills or abnormal weight loss Eyes: Denies blurriness of vision Ears, nose, mouth, throat, and face: Denies mucositis or sore throat Gastrointestinal:  Denies nausea, heartburn or change in bowel habits Skin: Denies abnormal skin rashes Lymphatics: Denies new lymphadenopathy or easy bruising Neurological:Denies numbness, tingling or new weaknesses Behavioral/Psych: Mood is stable, no new changes  All other systems were reviewed with the patient and are negative.  I have reviewed the past medical history, past surgical history, social history and family history with the patient and they are unchanged from previous note.  ALLERGIES:  has No Known Allergies.  MEDICATIONS:  Current Outpatient Prescriptions  Medication Sig Dispense Refill  . ACCU-CHEK FASTCLIX LANCETS MISC 1 each by Does not apply route daily.      Marland Kitchen gabapentin (NEURONTIN) 400 MG capsule Take 800 mg by mouth 3 (three) times daily.       Marland Kitchen glimepiride (AMARYL) 4 MG tablet Take 4 mg by mouth daily with breakfast.      . HYDROcodone-acetaminophen (NORCO/VICODIN) 5-325 MG per tablet Take 1-2 tablets by mouth every 6 (six) hours as needed for moderate pain.      Marland Kitchen lidocaine-prilocaine (EMLA) cream Apply 1 application topically as needed (for port access).      . OxyCODONE (OXYCONTIN) 10 mg T12A 12 hr tablet Take 10 mg by mouth every 12 (twelve) hours.      . ramipril (ALTACE) 2.5 MG capsule Take 2.5  mg by mouth every morning.      . simvastatin (ZOCOR) 20 MG tablet Take 20 mg by mouth at bedtime.       . sucralfate (CARAFATE) 1 GM/10ML suspension Take 10 mLs (1 g total) by mouth 4 (four) times daily -   with meals and at bedtime.  420 mL  3  . benzonatate (TESSALON) 100 MG capsule Take 1 capsule (100 mg total) by mouth 3 (three) times daily as needed for cough.  60 capsule  3  . ondansetron (ZOFRAN) 8 MG tablet Take 1 tablet (8 mg total) by mouth every 8 (eight) hours as needed for nausea.  30 tablet  1  . prochlorperazine (COMPAZINE) 10 MG tablet Take 1 tablet (10 mg total) by mouth every 6 (six) hours as needed (Nausea or vomiting).  30 tablet  1   Current Facility-Administered Medications  Medication Dose Route Frequency Provider Last Rate Last Dose  . Influenza vac split quadrivalent PF (FLUARIX) injection 0.5 mL  0.5 mL Intramuscular Once Heath Lark, MD        PHYSICAL EXAMINATION: ECOG PERFORMANCE STATUS: 0 - Asymptomatic  Filed Vitals:   08/23/14 0828  BP: 144/77  Pulse: 59  Temp: 97.8 F (36.6 C)  Resp: 18   Filed Weights   08/23/14 0828  Weight: 228 lb 11.2 oz (103.738 kg)    GENERAL:alert, no distress and comfortable SKIN: skin color, texture, turgor are normal, no rashes or significant lesions. Well-healed surgical scar on his head EYES: normal, Conjunctiva are pink and non-injected, sclera clear OROPHARYNX:no exudate, no erythema and lips, buccal mucosa, and tongue normal  NECK: supple, thyroid normal size, non-tender, without nodularity LYMPH:  no palpable lymphadenopathy in the cervical, axillary or inguinal LUNGS: clear to auscultation and percussion with normal breathing effort HEART: regular rate & rhythm and no murmurs and no lower extremity edema ABDOMEN:abdomen soft, non-tender and normal bowel sounds Musculoskeletal:no cyanosis of digits and no clubbing  NEURO: alert & oriented x 3 with fluent speech, no focal motor/sensory deficits  LABORATORY DATA:  I have reviewed the data as listed    Component Value Date/Time   NA 141 08/23/2014 0818   NA 139 07/20/2014 0500   K 4.0 08/23/2014 0818   K 4.4 07/20/2014 0500   CL 105 07/20/2014 0500   CL 104 06/06/2013 0841    CO2 20* 08/23/2014 0818   CO2 20 07/20/2014 0500   GLUCOSE 187* 08/23/2014 0818   GLUCOSE 186* 07/20/2014 0500   GLUCOSE 258* 06/06/2013 0841   BUN 9.9 08/23/2014 0818   BUN 16 07/20/2014 0500   CREATININE 1.2 08/23/2014 0818   CREATININE 0.96 07/20/2014 0500   CREATININE 0.94 12/29/2013 0932   CALCIUM 9.2 08/23/2014 0818   CALCIUM 8.4 07/20/2014 0500   PROT 7.0 08/23/2014 0818   PROT 7.3 07/14/2014 0414   ALBUMIN 3.3* 08/23/2014 0818   ALBUMIN 3.5 07/14/2014 0414   AST 12 08/23/2014 0818   AST 15 07/14/2014 0414   ALT 11 08/23/2014 0818   ALT 15 07/14/2014 0414   ALKPHOS 79 08/23/2014 0818   ALKPHOS 84 07/14/2014 0414   BILITOT 0.36 08/23/2014 0818   BILITOT 0.3 07/14/2014 0414   GFRNONAA >90 07/20/2014 0500   GFRNONAA >89 12/29/2013 0932   GFRAA >90 07/20/2014 0500   GFRAA >89 12/29/2013 0932    No results found for this basename: SPEP, UPEP,  kappa and lambda light chains    Lab Results  Component Value Date   WBC  5.0 08/23/2014   NEUTROABS 3.5 08/23/2014   HGB 14.1 08/23/2014   HCT 45.0 08/23/2014   MCV 84.7 08/23/2014   PLT 202 08/23/2014      Chemistry      Component Value Date/Time   NA 141 08/23/2014 0818   NA 139 07/20/2014 0500   K 4.0 08/23/2014 0818   K 4.4 07/20/2014 0500   CL 105 07/20/2014 0500   CL 104 06/06/2013 0841   CO2 20* 08/23/2014 0818   CO2 20 07/20/2014 0500   BUN 9.9 08/23/2014 0818   BUN 16 07/20/2014 0500   CREATININE 1.2 08/23/2014 0818   CREATININE 0.96 07/20/2014 0500   CREATININE 0.94 12/29/2013 0932      Component Value Date/Time   CALCIUM 9.2 08/23/2014 0818   CALCIUM 8.4 07/20/2014 0500   ALKPHOS 79 08/23/2014 0818   ALKPHOS 84 07/14/2014 0414   AST 12 08/23/2014 0818   AST 15 07/14/2014 0414   ALT 11 08/23/2014 0818   ALT 15 07/14/2014 0414   BILITOT 0.36 08/23/2014 0818   BILITOT 0.3 07/14/2014 0414      ASSESSMENT & PLAN:  Metastatic adenocarcinoma to brain We discussed the role of chemotherapy. The intent is for palliative.  We discussed some of the risks, benefits, side-effects of Abraxane  and carboplatin. Some of the short term side-effects included, though not limited to, including weight loss, life threatening infections, risk of allergic reactions, need for transfusions of blood products, nausea, vomiting, change in bowel habits, loss of hair, admission to hospital for various reasons, and risks of death.   Long term side-effects are also discussed including risks of infertility, permanent damage to nerve function, hearing loss, chronic fatigue, kidney damage with possibility needing hemodialysis, and rare secondary malignancy including bone marrow disorders.  The patient is aware that the response rates discussed earlier is not guaranteed.  After a long discussion, patient made an informed decision to proceed with the prescribed plan of care.   Patient education material was dispensed. I have ordered him to support for him but that was delayed until end of this week. We will proceed with chemotherapy today using his peripheral veins. My plan would be to modify his chemotherapy to 2 weeks on 1 week off. After approximately 3 months of treatment, I will repeat imaging study. In the future, I might continue on Abraxane alone for maintenance therapy.   Brain metastases MRI has been ordered by radiation oncologist to follow.  Tobacco abuse The patient intended to quit smoking after today.  Esophagitis This is likely due to chronic esophagitis from smoking and drinking. Recommend the patient to stop drinking alcohol. I have gave him prescription of Carafate.  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 I gave him some cough suppressant to take.  Diabetic neuropathy, painful I will monitor neuropathy carefully while on chemotherapy. He is currently taking medication for this.  Preventive measure We discussed the importance of preventive care and reviewed the vaccination programs. He does not have any prior allergic reactions to influenza vaccination. He agrees to proceed with  influenza vaccination today and we will administer it today at the clinic.    Orders Placed This Encounter  Procedures  . CBC with Differential    Standing Status: Standing     Number of Occurrences: 9     Standing Expiration Date: 08/24/2015  . Comprehensive metabolic panel    Standing Status: Standing     Number of Occurrences: 9     Standing Expiration  Date: 08/24/2015   All questions were answered. The patient knows to call the clinic with any problems, questions or concerns. No barriers to learning was detected. I spent 30 minutes counseling the patient face to face. The total time spent in the appointment was 40 minutes and more than 50% was on counseling and review of test results     [facility], Glendon Fiser, MD 08/23/2014 9:05 AM

## 2014-08-23 NOTE — Assessment & Plan Note (Signed)
MRI has been ordered by radiation oncologist to follow.

## 2014-08-23 NOTE — Assessment & Plan Note (Signed)
We discussed the role of chemotherapy. The intent is for palliative.  We discussed some of the risks, benefits, side-effects of Abraxane and carboplatin. Some of the short term side-effects included, though not limited to, including weight loss, life threatening infections, risk of allergic reactions, need for transfusions of blood products, nausea, vomiting, change in bowel habits, loss of hair, admission to hospital for various reasons, and risks of death.   Long term side-effects are also discussed including risks of infertility, permanent damage to nerve function, hearing loss, chronic fatigue, kidney damage with possibility needing hemodialysis, and rare secondary malignancy including bone marrow disorders.  The patient is aware that the response rates discussed earlier is not guaranteed.  After a long discussion, patient made an informed decision to proceed with the prescribed plan of care.   Patient education material was dispensed. I have ordered him to support for him but that was delayed until end of this week. We will proceed with chemotherapy today using his peripheral veins. My plan would be to modify his chemotherapy to 2 weeks on 1 week off. After approximately 3 months of treatment, I will repeat imaging study. In the future, I might continue on Abraxane alone for maintenance therapy.

## 2014-08-23 NOTE — Telephone Encounter (Signed)
Per staff message and POF I have scheduled appts. Advised scheduler of appts. JMW  

## 2014-08-23 NOTE — Assessment & Plan Note (Signed)
I will monitor neuropathy carefully while on chemotherapy. He is currently taking medication for this.

## 2014-08-23 NOTE — Patient Instructions (Addendum)
[facility] Discharge Instructions for Patients Receiving Chemotherapy  Today you received the following chemotherapy agents: Abraxane, Carboplatin. YOU RECEIVED YOUR FLU SHOT TODAY AS WELL.  To help prevent nausea and vomiting after your treatment, we encourage you to take your nausea medication as prescribed by your physician.    If you develop nausea and vomiting that is not controlled by your nausea medication, call the clinic.   BELOW ARE SYMPTOMS THAT SHOULD BE REPORTED IMMEDIATELY:  *FEVER GREATER THAN 100.5 F  *CHILLS WITH OR WITHOUT FEVER  NAUSEA AND VOMITING THAT IS NOT CONTROLLED WITH YOUR NAUSEA MEDICATION  *UNUSUAL SHORTNESS OF BREATH  *UNUSUAL BRUISING OR BLEEDING  TENDERNESS IN MOUTH AND THROAT WITH OR WITHOUT PRESENCE OF ULCERS  *URINARY PROBLEMS  *BOWEL PROBLEMS  UNUSUAL RASH Items with * indicate a potential emergency and should be followed up as soon as possible.  Feel free to call the clinic you have any questions or concerns. The clinic phone number is [phone].   Nanoparticle Albumin-Bound Paclitaxel injection (Abraxane) What is this medicine? NANOPARTICLE ALBUMIN-BOUND PACLITAXEL (Na no PAHR ti kuhl al BYOO muhn-bound PAK li TAX el) is a chemotherapy drug. It targets fast dividing cells, like cancer cells, and causes these cells to die. This medicine is used to treat advanced breast cancer and advanced lung cancer. This medicine may be used for other purposes; ask your health care provider or pharmacist if you have questions. COMMON BRAND NAME(S): Abraxane What should I tell my health care provider before I take this medicine? They need to know if you have any of these conditions: -kidney disease -liver disease -low blood counts, like low platelets, red blood cells, or white blood cells -recent or ongoing radiation therapy -an unusual or allergic reaction to paclitaxel, albumin, other chemotherapy, other medicines, foods, dyes, or  preservatives -pregnant or trying to get pregnant -breast-feeding How should I use this medicine? This drug is given as an infusion into a vein. It is administered in a hospital or clinic by a specially trained health care professional. Talk to your pediatrician regarding the use of this medicine in children. Special care may be needed. Overdosage: If you think you have taken too much of this medicine contact a poison control center or emergency room at once. NOTE: This medicine is only for you. Do not share this medicine with others. What if I miss a dose? It is important not to miss your dose. Call your doctor or health care professional if you are unable to keep an appointment. What may interact with this medicine? -cyclosporine -diazepam -ketoconazole -medicines to increase blood counts like filgrastim, pegfilgrastim, sargramostim -other chemotherapy drugs like cisplatin, doxorubicin, epirubicin, etoposide, teniposide, vincristine -quinidine -testosterone -vaccines -verapamil Talk to your doctor or health care professional before taking any of these medicines: -acetaminophen -aspirin -ibuprofen -ketoprofen -naproxen This list may not describe all possible interactions. Give your health care provider a list of all the medicines, herbs, non-prescription drugs, or dietary supplements you use. Also tell them if you smoke, drink alcohol, or use illegal drugs. Some items may interact with your medicine. What should I watch for while using this medicine? Your condition will be monitored carefully while you are receiving this medicine. You will need important blood work done while you are taking this medicine. This drug may make you feel generally unwell. This is not uncommon, as chemotherapy can affect healthy cells as well as cancer cells. Report any side effects. Continue your course of treatment even though  you feel ill unless your doctor tells you to stop. In some cases, you may be  given additional medicines to help with side effects. Follow all directions for their use. Call your doctor or health care professional for advice if you get a fever, chills or sore throat, or other symptoms of a cold or flu. Do not treat yourself. This drug decreases your body's ability to fight infections. Try to avoid being around people who are sick. This medicine may increase your risk to bruise or bleed. Call your doctor or health care professional if you notice any unusual bleeding. Be careful brushing and flossing your teeth or using a toothpick because you may get an infection or bleed more easily. If you have any dental work done, tell your dentist you are receiving this medicine. Avoid taking products that contain aspirin, acetaminophen, ibuprofen, naproxen, or ketoprofen unless instructed by your doctor. These medicines may hide a fever. Do not become pregnant while taking this medicine. Women should inform their doctor if they wish to become pregnant or think they might be pregnant. There is a potential for serious side effects to an unborn child. Talk to your health care professional or pharmacist for more information. Do not breast-feed an infant while taking this medicine. Men are advised not to father a child while receiving this medicine. What side effects may I notice from receiving this medicine? Side effects that you should report to your doctor or health care professional as soon as possible: -allergic reactions like skin rash, itching or hives, swelling of the face, lips, or tongue -low blood counts - This drug may decrease the number of white blood cells, red blood cells and platelets. You may be at increased risk for infections and bleeding. -signs of infection - fever or chills, cough, sore throat, pain or difficulty passing urine -signs of decreased platelets or bleeding - bruising, pinpoint red spots on the skin, black, tarry stools, nosebleeds -signs of decreased red blood  cells - unusually weak or tired, fainting spells, lightheadedness -breathing problems -changes in vision -chest pain -high or low blood pressure -mouth sores -nausea and vomiting -pain, swelling, redness or irritation at the injection site -pain, tingling, numbness in the hands or feet -slow or irregular heartbeat -swelling of the ankle, feet, hands Side effects that usually do not require medical attention (report to your doctor or health care professional if they continue or are bothersome): -aches, pains -changes in the color of fingernails -diarrhea -hair loss -loss of appetite This list may not describe all possible side effects. Call your doctor for medical advice about side effects. You may report side effects to FDA at 1-800-FDA-1088. Where should I keep my medicine? This drug is given in a hospital or clinic and will not be stored at home. NOTE: This sheet is a summary. It may not cover all possible information. If you have questions about this medicine, talk to your doctor, pharmacist, or health care provider.  2015, [employer]. (2013-01-24 16:48:50)  Carboplatin injection What is this medicine? CARBOPLATIN (KAR boe pla tin) is a chemotherapy drug. It targets fast dividing cells, like cancer cells, and causes these cells to die. This medicine is used to treat ovarian cancer and many other cancers. This medicine may be used for other purposes; ask your health care provider or pharmacist if you have questions. COMMON BRAND NAME(S): Paraplatin What should I tell my health care provider before I take this medicine? They need to know if you have any of  these conditions: -blood disorders -hearing problems -kidney disease -recent or ongoing radiation therapy -an unusual or allergic reaction to carboplatin, cisplatin, other chemotherapy, other medicines, foods, dyes, or preservatives -pregnant or trying to get pregnant -breast-feeding How should I use this  medicine? This drug is usually given as an infusion into a vein. It is administered in a hospital or clinic by a specially trained health care professional. Talk to your pediatrician regarding the use of this medicine in children. Special care may be needed. Overdosage: If you think you have taken too much of this medicine contact a poison control center or emergency room at once. NOTE: This medicine is only for you. Do not share this medicine with others. What if I miss a dose? It is important not to miss a dose. Call your doctor or health care professional if you are unable to keep an appointment. What may interact with this medicine? -medicines for seizures -medicines to increase blood counts like filgrastim, pegfilgrastim, sargramostim -some antibiotics like amikacin, gentamicin, neomycin, streptomycin, tobramycin -vaccines Talk to your doctor or health care professional before taking any of these medicines: -acetaminophen -aspirin -ibuprofen -ketoprofen -naproxen This list may not describe all possible interactions. Give your health care provider a list of all the medicines, herbs, non-prescription drugs, or dietary supplements you use. Also tell them if you smoke, drink alcohol, or use illegal drugs. Some items may interact with your medicine. What should I watch for while using this medicine? Your condition will be monitored carefully while you are receiving this medicine. You will need important blood work done while you are taking this medicine. This drug may make you feel generally unwell. This is not uncommon, as chemotherapy can affect healthy cells as well as cancer cells. Report any side effects. Continue your course of treatment even though you feel ill unless your doctor tells you to stop. In some cases, you may be given additional medicines to help with side effects. Follow all directions for their use. Call your doctor or health care professional for advice if you get a  fever, chills or sore throat, or other symptoms of a cold or flu. Do not treat yourself. This drug decreases your body's ability to fight infections. Try to avoid being around people who are sick. This medicine may increase your risk to bruise or bleed. Call your doctor or health care professional if you notice any unusual bleeding. Be careful brushing and flossing your teeth or using a toothpick because you may get an infection or bleed more easily. If you have any dental work done, tell your dentist you are receiving this medicine. Avoid taking products that contain aspirin, acetaminophen, ibuprofen, naproxen, or ketoprofen unless instructed by your doctor. These medicines may hide a fever. Do not become pregnant while taking this medicine. Women should inform their doctor if they wish to become pregnant or think they might be pregnant. There is a potential for serious side effects to an unborn child. Talk to your health care professional or pharmacist for more information. Do not breast-feed an infant while taking this medicine. What side effects may I notice from receiving this medicine? Side effects that you should report to your doctor or health care professional as soon as possible: -allergic reactions like skin rash, itching or hives, swelling of the face, lips, or tongue -signs of infection - fever or chills, cough, sore throat, pain or difficulty passing urine -signs of decreased platelets or bleeding - bruising, pinpoint red spots on the skin, black,  tarry stools, nosebleeds -signs of decreased red blood cells - unusually weak or tired, fainting spells, lightheadedness -breathing problems -changes in hearing -changes in vision -chest pain -high blood pressure -low blood counts - This drug may decrease the number of white blood cells, red blood cells and platelets. You may be at increased risk for infections and bleeding. -nausea and vomiting -pain, swelling, redness or irritation at the  injection site -pain, tingling, numbness in the hands or feet -problems with balance, talking, walking -trouble passing urine or change in the amount of urine Side effects that usually do not require medical attention (report to your doctor or health care professional if they continue or are bothersome): -hair loss -loss of appetite -metallic taste in the mouth or changes in taste This list may not describe all possible side effects. Call your doctor for medical advice about side effects. You may report side effects to FDA at 1-800-FDA-1088. Where should I keep my medicine? This drug is given in a hospital or clinic and will not be stored at home. NOTE: This sheet is a summary. It may not cover all possible information. If you have questions about this medicine, talk to your doctor, pharmacist, or health care provider.  2015, [employer]. (2008-03-07 14:38:05)

## 2014-08-23 NOTE — Assessment & Plan Note (Signed)
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 I gave him some cough suppressant to take.

## 2014-08-23 NOTE — Assessment & Plan Note (Signed)
We discussed the importance of preventive care and reviewed the vaccination programs. He does not have any prior allergic reactions to influenza vaccination. He agrees to proceed with influenza vaccination today and we will administer it today at the clinic.  

## 2014-08-24 ENCOUNTER — Other Ambulatory Visit: Payer: Self-pay | Admitting: Radiology

## 2014-08-24 ENCOUNTER — Telehealth: Payer: Self-pay | Admitting: *Deleted

## 2014-08-24 NOTE — Telephone Encounter (Signed)
Message copied by Cherylynn Ridges on Thu Aug 24, 2014  4:54 PM ------      Message from: Adalberto Cole      Created: Wed Aug 23, 2014  9:34 AM      Regarding: new chemo added      Contact: [phone]       abraxane added to [place] tx ------

## 2014-08-24 NOTE — Telephone Encounter (Signed)
Called [name] for chemotherapy F/U.  Patient is doing well.  Denies n/v.  Denies any new side effects or symptoms.  "Bowels moved today but my stomach feels full."  Denies emptying well.  bladder is functioning well.  Eating and drinking well BUT HAS ONLY DRANK TWO 8 OZ BOTTLES WATER.  instructed to drink eight of these to equal 64 oz minimum daily or at least the day before, of and after treatment.  Denies questions at this time and encouraged to call if needed.  Reviewed how to call after hours in the case of an emergency.

## 2014-08-25 ENCOUNTER — Other Ambulatory Visit: Payer: Self-pay | Admitting: Hematology and Oncology

## 2014-08-25 ENCOUNTER — Ambulatory Visit (HOSPITAL_COMMUNITY)
Admission: RE | Admit: 2014-08-25 | Discharge: 2014-08-25 | Disposition: A | Discharge status: Home / Self Care | Payer: Medicaid Other | Source: Ambulatory Visit | Attending: Hematology and Oncology | Admitting: Hematology and Oncology

## 2014-08-25 DIAGNOSIS — C7931 Secondary malignant neoplasm of brain: Secondary | ICD-10-CM

## 2014-08-25 DIAGNOSIS — Z923 Personal history of irradiation: Secondary | ICD-10-CM | POA: Diagnosis not present

## 2014-08-25 DIAGNOSIS — R059 Cough, unspecified: Secondary | ICD-10-CM | POA: Insufficient documentation

## 2014-08-25 DIAGNOSIS — F17200 Nicotine dependence, unspecified, uncomplicated: Secondary | ICD-10-CM | POA: Insufficient documentation

## 2014-08-25 DIAGNOSIS — Z79899 Other long term (current) drug therapy: Secondary | ICD-10-CM | POA: Insufficient documentation

## 2014-08-25 DIAGNOSIS — M549 Dorsalgia, unspecified: Secondary | ICD-10-CM | POA: Insufficient documentation

## 2014-08-25 DIAGNOSIS — C3490 Malignant neoplasm of unspecified part of unspecified bronchus or lung: Secondary | ICD-10-CM

## 2014-08-25 DIAGNOSIS — C7949 Secondary malignant neoplasm of other parts of nervous system: Secondary | ICD-10-CM

## 2014-08-25 DIAGNOSIS — C7951 Secondary malignant neoplasm of bone: Secondary | ICD-10-CM | POA: Insufficient documentation

## 2014-08-25 DIAGNOSIS — K209 Esophagitis, unspecified without bleeding: Secondary | ICD-10-CM | POA: Diagnosis not present

## 2014-08-25 DIAGNOSIS — J438 Other emphysema: Secondary | ICD-10-CM | POA: Insufficient documentation

## 2014-08-25 DIAGNOSIS — R05 Cough: Secondary | ICD-10-CM | POA: Diagnosis not present

## 2014-08-25 DIAGNOSIS — E1149 Type 2 diabetes mellitus with other diabetic neurological complication: Secondary | ICD-10-CM | POA: Diagnosis not present

## 2014-08-25 DIAGNOSIS — C781 Secondary malignant neoplasm of mediastinum: Secondary | ICD-10-CM | POA: Diagnosis not present

## 2014-08-25 DIAGNOSIS — Z452 Encounter for adjustment and management of vascular access device: Secondary | ICD-10-CM | POA: Diagnosis present

## 2014-08-25 DIAGNOSIS — C7952 Secondary malignant neoplasm of bone marrow: Secondary | ICD-10-CM

## 2014-08-25 DIAGNOSIS — E1142 Type 2 diabetes mellitus with diabetic polyneuropathy: Secondary | ICD-10-CM | POA: Diagnosis not present

## 2014-08-25 LAB — CBC WITH DIFFERENTIAL/PLATELET
Basophils Absolute: 0 10*3/uL (ref 0.0–0.1)
Basophils Relative: 0 % (ref 0–1)
EOS ABS: 0 10*3/uL (ref 0.0–0.7)
EOS PCT: 0 % (ref 0–5)
HEMATOCRIT: 43 % (ref 39.0–52.0)
Hemoglobin: 14.1 g/dL (ref 13.0–17.0)
LYMPHS ABS: 0.7 10*3/uL (ref 0.7–4.0)
Lymphocytes Relative: 10 % — ABNORMAL LOW (ref 12–46)
MCH: 27.5 pg (ref 26.0–34.0)
MCHC: 32.8 g/dL (ref 30.0–36.0)
MCV: 84 fL (ref 78.0–100.0)
MONO ABS: 0.3 10*3/uL (ref 0.1–1.0)
MONOS PCT: 4 % (ref 3–12)
Neutro Abs: 6.3 10*3/uL (ref 1.7–7.7)
Neutrophils Relative %: 86 % — ABNORMAL HIGH (ref 43–77)
Platelets: 217 10*3/uL (ref 150–400)
RBC: 5.12 MIL/uL (ref 4.22–5.81)
RDW: 15.5 % (ref 11.5–15.5)
WBC: 7.3 10*3/uL (ref 4.0–10.5)

## 2014-08-25 LAB — BASIC METABOLIC PANEL
Anion gap: 12 (ref 5–15)
BUN: 17 mg/dL (ref 6–23)
CO2: 25 meq/L (ref 19–32)
Calcium: 9.5 mg/dL (ref 8.4–10.5)
Chloride: 106 mEq/L (ref 96–112)
Creatinine, Ser: 1.17 mg/dL (ref 0.50–1.35)
GFR calc Af Amer: 78 mL/min — ABNORMAL LOW (ref >90)
GFR calc non Af Amer: 68 mL/min — ABNORMAL LOW (ref >90)
GLUCOSE: 59 mg/dL — AB (ref 70–99)
Potassium: 3.8 mEq/L (ref 3.7–5.3)
SODIUM: 143 meq/L (ref 137–147)

## 2014-08-25 LAB — GLUCOSE, CAPILLARY
GLUCOSE-CAPILLARY: 52 mg/dL — AB (ref 70–99)
Glucose-Capillary: 55 mg/dL — ABNORMAL LOW (ref 70–99)
Glucose-Capillary: 97 mg/dL (ref 70–99)
Glucose-Capillary: 89 mg/dL (ref 70–99)

## 2014-08-25 LAB — APTT: aPTT: 26 seconds (ref 24–37)

## 2014-08-25 LAB — PROTIME-INR
INR: 0.99 (ref 0.00–1.49)
Prothrombin Time: 13.1 seconds (ref 11.6–15.2)

## 2014-08-25 MED ORDER — MIDAZOLAM HCL 2 MG/2ML IJ SOLN
INTRAMUSCULAR | Status: AC
  Filled 2014-08-25: qty 4

## 2014-08-25 MED ORDER — FENTANYL CITRATE 0.05 MG/ML IJ SOLN
INTRAMUSCULAR | Status: AC
  Filled 2014-08-25: qty 4

## 2014-08-25 MED ORDER — CEFAZOLIN SODIUM-DEXTROSE 2-3 GM-% IV SOLR
2.0000 g | INTRAVENOUS | Status: AC
  Administered 2014-08-25: 2 g via INTRAVENOUS

## 2014-08-25 MED ORDER — MIDAZOLAM HCL 2 MG/2ML IJ SOLN
INTRAMUSCULAR | Status: AC | PRN
Start: 2014-08-25 — End: 2014-08-25
  Administered 2014-08-25: 2 mg via INTRAVENOUS

## 2014-08-25 MED ORDER — SODIUM CHLORIDE 0.9 % IV SOLN
INTRAVENOUS | Status: DC
  Administered 2014-08-25: 13:00:00 via INTRAVENOUS

## 2014-08-25 MED ORDER — DEXTROSE 50 % IV SOLN
0.5000 | Freq: Once | INTRAVENOUS | Status: AC
  Administered 2014-08-25: 25 mL via INTRAVENOUS

## 2014-08-25 MED ORDER — LIDOCAINE HCL 1 % IJ SOLN
INTRAMUSCULAR | Status: AC
  Filled 2014-08-25: qty 20

## 2014-08-25 MED ORDER — DEXTROSE-NACL 5-0.9 % IV SOLN
INTRAVENOUS | Status: DC
  Administered 2014-08-25: 14:00:00 via INTRAVENOUS

## 2014-08-25 MED ORDER — DEXTROSE 50 % IV SOLN
INTRAVENOUS | Status: AC
  Filled 2014-08-25: qty 50

## 2014-08-25 MED ORDER — CEFAZOLIN SODIUM-DEXTROSE 2-3 GM-% IV SOLR
INTRAVENOUS | Status: AC
  Filled 2014-08-25: qty 50

## 2014-08-25 MED ORDER — HEPARIN SOD (PORK) LOCK FLUSH 100 UNIT/ML IV SOLN
INTRAVENOUS | Status: AC
  Filled 2014-08-25: qty 5

## 2014-08-25 MED ORDER — FENTANYL CITRATE 0.05 MG/ML IJ SOLN
INTRAMUSCULAR | Status: AC | PRN
  Administered 2014-08-25: 100 ug via INTRAVENOUS

## 2014-08-25 MED ORDER — HEPARIN SOD (PORK) LOCK FLUSH 100 UNIT/ML IV SOLN
500.0000 [IU] | Freq: Once | INTRAVENOUS | Status: AC
  Administered 2014-08-25: 500 [IU] via INTRAVENOUS

## 2014-08-25 NOTE — Procedures (Signed)
R IJ Port cathter placement with US and fluoroscopy No complication No blood loss. See complete dictation in [facility].  

## 2014-08-25 NOTE — H&P (Signed)
Chief Complaint: "I'm getting a port a cath"   Referring Physician(s): Gorsuch,Ni  History of Present Illness: Rodney Powers is a 58 y.o. 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  Past Medical History  Diagnosis Date  . Back pain, chronic   . Mediastinal mass   . Urinary retention   . Diabetic neuropathy   . Diabetes mellitus, type II     takes Gimepimide and Metformin daily  . Enlarged prostate     takes Flomax every evening  . Hypertension     slightly elevated recently;no meds;just watching for now  . COPD (chronic obstructive pulmonary disease)     emphysema  . Shortness of breath     with exertion  . Pneumonia     hx of;last time about 36yrs ago  . History of bronchitis     last time 47yrs ago  . Joint pain     stiffness and cramping in both hands  . Urinary frequency   . Nocturia   . Anemia     no meds and hasn't had a transfusion ever  . Anxiety   . Depression     no meds required;hospitatlized 62yrs ago for this  . Hx of radiation therapy 04/19/13- 06/07/13    left upper central chest, 63 gray 35 fx  . Heart murmur     as a baby  . Diarrhea   . Neuropathy   . Back pain 11/14/2013  . On home O2     2L N/C  . Lung cancer 04/07/2013  . Esophagitis 08/23/2014  . Cough 08/23/2014    Past Surgical History  Procedure Laterality Date  . Video bronchoscopy with endobronchial ultrasound Bilateral 03/10/2013    Procedure: VIDEO BRONCHOSCOPY WITH ENDOBRONCHIAL ULTRASOUND;  Surgeon: Rigoberto Noel, MD;  Location: [facility];  Service: Pulmonary;  Laterality: Bilateral;  . Mediastinotomy chamberlain mcneil Left 03/24/2013    Procedure: MEDIASTINOTOMY CHAMBERLAIN MCNEIL;  Surgeon: Melrose Nakayama, MD;  Location: [facility];  Service: Thoracic;  Laterality: Left;  . Excisional hemorrhoidectomy      > 30 yrs ago  . Colonoscopy    .  Direct laryngoscopy N/A 07/01/2013    Procedure: DIRECT LARYNGOSCOPY;  Surgeon: Izora Gala, MD;  Location: [facility] [place];  Service: ENT;  Laterality: N/A;  . Esophagoscopy N/A 07/01/2013    Procedure: ESOPHAGOSCOPY WITH BIOPSY;  Surgeon: Izora Gala, MD;  Location: [facility] OR;  Service: ENT;  Laterality: N/A;  . Direct laryngoscopy N/A 08/01/2013    Procedure: DIRECT LARYNGOSCOPY WITH EXCISION OF PHARYNGEAL MASS;  Surgeon: Izora Gala, MD;  Location: [facility];  Service: ENT;  Laterality: N/A;  . Tonsillectomy Right 08/01/2013    Procedure: TONSILLECTOMY;  Surgeon: Izora Gala, MD;  Location: [facility] OR;  Service: ENT;  Laterality: Right;  . Craniotomy N/A 07/19/2014    Procedure: CRANIOTOMY TUMOR EXCISION with Stealth;  Surgeon: Consuella Lose, MD;  Location: [facility];  Service: Neurosurgery;  Laterality: N/A;  CRANIOTOMY FOR EXCISION OF TUMOR    Allergies: Review of patient's allergies indicates no known allergies.  Medications: Prior to Admission medications   Medication Sig Start Date End Date Taking? Authorizing Provider  benzonatate (TESSALON) 100 MG capsule Take 1 capsule (100 mg total) by mouth 3 (three) times daily as needed for cough. 08/23/14  Yes Heath Lark, MD  gabapentin (NEURONTIN) 400 MG capsule Take 800  mg by mouth 3 (three) times daily.    Yes Historical Provider, MD  glimepiride (AMARYL) 4 MG tablet Take 4 mg by mouth daily with breakfast.   Yes Historical Provider, MD  HYDROcodone-acetaminophen (NORCO/VICODIN) 5-325 MG per tablet Take 1-2 tablets by mouth every 6 (six) hours as needed for moderate pain. 07/15/14  Yes Reyne Dumas, MD  lidocaine-prilocaine (EMLA) cream Apply 1 application topically as needed (for port access).   Yes Historical Provider, MD  OxyCODONE (OXYCONTIN) 10 mg T12A 12 hr tablet Take 10 mg by mouth every 12 (twelve) hours.   Yes Historical Provider, MD  prochlorperazine (COMPAZINE) 10 MG tablet Take 1 tablet (10 mg total) by mouth every 6 (six) hours as needed (Nausea or  vomiting). 08/23/14  Yes Heath Lark, MD  ramipril (ALTACE) 2.5 MG capsule Take 2.5 mg by mouth every morning.   Yes Historical Provider, MD  simvastatin (ZOCOR) 20 MG tablet Take 20 mg by mouth at bedtime.    Yes Historical Provider, MD  sucralfate (CARAFATE) 1 GM/10ML suspension Take 10 mLs (1 g total) by mouth 4 (four) times daily -  with meals and at bedtime. 08/23/14  Yes Heath Lark, MD  traMADol (ULTRAM) 50 MG tablet Take 100 mg by mouth every 6 (six) hours as needed.   Yes Historical Provider, MD  ACCU-CHEK FASTCLIX LANCETS MISC 1 each by Does not apply route daily.    Historical Provider, MD  ondansetron (ZOFRAN) 8 MG tablet Take 1 tablet (8 mg total) by mouth every 8 (eight) hours as needed for nausea. 08/23/14   Heath Lark, MD    Family History  Problem Relation Age of Onset  . Aneurysm Mother   . Heart attack Father   . Aneurysm Sister   . Cancer Brother     History   Social History  . Marital Status: Legally Separated    Spouse Name: N/A    Number of Children: 3  . Years of Education: N/A   Occupational History  .      [employer]   Social History Main Topics  . Smoking status: Current Every Day Smoker -- 0.50 packs/day for 41 years    Types: Cigarettes    Start date: 07/18/1971  . Smokeless tobacco: Never Used     Comment: nicotene patch  . Alcohol Use: 8.4 oz/week    14 Cans of beer per week     Comment: daily beer 2  . Drug Use: No     Comment: History of Marijuana use 30 years ago  . Sexual Activity: No   Other Topics Concern  . Not on file   Social History Narrative  . No narrative on file         Review of Systems  Constitutional: Negative for fever and chills.  Respiratory: Negative for shortness of breath.        Occ cough  Cardiovascular: Negative for chest pain.  Gastrointestinal: Negative for nausea, vomiting, abdominal pain and blood in stool.  Musculoskeletal: Negative for back pain.  Neurological: Negative for headaches.  Hematological:  Does not bruise/bleed easily.    Vital Signs: There were no vitals taken for this visit.  Physical Exam  Constitutional: He is oriented to person, place, and time. He appears well-developed and well-nourished.  Cardiovascular: Normal rate and regular rhythm.   Pulmonary/Chest: Effort normal.  Sl dim BS on left, right clear  Abdominal: Soft. Bowel sounds are normal. There is no tenderness.  Musculoskeletal: Normal range of motion.  He exhibits no edema.  Neurological: He is alert and oriented to person, place, and time.    Imaging: Ct Chest W Contrast  08/04/2014   CLINICAL DATA:  Lung cancer.  EXAM: CT CHEST WITH CONTRAST  TECHNIQUE: Multidetector CT imaging of the chest was performed during intravenous contrast administration.  CONTRAST:  17mL OMNIPAQUE IOHEXOL 300 MG/ML  SOLN  COMPARISON:  02/22/2014  FINDINGS: Soft tissue / Mediastinum: There is no axillary lymphadenopathy. 8 mm short axis high right paratracheal lymph node measured on the previous study is stable at 8 mm. 9 mm short axis subcarinal lymph node on today's study was 10 mm previously. No other mediastinal or hilar lymphadenopathy.  The low-density soft tissue thickening identified in the prevascular space previously persists and measure smaller today at 3.8 x 1.2 cm compared to 4.1 x 1.7 cm previously. The pleural thickening seen anteromedially in the left hemi thorax (image 19 of series 2 today) is unchanged.  Lungs / Pleura: Advanced emphysema is noted bilaterally. The interstitial and alveolar opacity seen in the medial portion of the left apex is not substantially changed and is presumed to represent post radiation affects. Left pleural effusion has decreased in the interval.  The 4 mm right upper lobe nodule seen previously has decreased in the interval measuring 2 mm on the current study. 5 mm right middle lobe nodule on image 41 is stable. The patient had several other tiny scattered nodules in the posterior right middle lobe  on the previous study in these have resolved in the interval. There is an area of apparent chronic atelectasis or scarring in the posterior left lower lobe which is unchanged.  Bones: Bone windows reveal no worrisome lytic or sclerotic osseous lesions.  Upper Abdomen: The 2.0 cm low-density lesion in the posterior right liver measures 1.9 cm on image 62 of series 2 today. Two additional liver lesions seen on image 57 today are also stable. The more posterior of these 2 was measured previously at 11 mm in this is unchanged. Gallstones are noted on the previous study, but gallbladder not included on today's exam. No evidence for an adrenal nodule.  IMPRESSION: 1. Stable presumed post radiation changes in the medial left upper lobe with interval decrease in left pleural effusion. 2. Continued decrease in irregular mediastinal soft tissue in the prevascular space. 3. Advanced emphysema with scattered stable to decreased pulmonary nodules. Some pulmonary nodules have resolved in the interval. No new or progressing pulmonary nodule identified on today's study.   Electronically Signed   By: Misty Stanley M.D.   On: 08/04/2014 08:31   Nm Bone Scan Whole Body  08/04/2014   CLINICAL DATA:  Metastatic lung cancer to the brain.  EXAM: NUCLEAR MEDICINE WHOLE BODY BONE SCAN  TECHNIQUE: Whole body anterior and posterior images were obtained approximately 3 hours after intravenous injection of radiopharmaceutical.  RADIOPHARMACEUTICALS:  26.6 mCi Technetium-99 MDP  COMPARISON:  PET-CT dated 03/15/2013  FINDINGS: There is evidence of the recent right craniotomy. The rest of the skeleton demonstrates no significant abnormality.  IMPRESSION: No evidence of metastatic disease to the skeleton. Recent craniotomy changes on the right.   Electronically Signed   By: Rozetta Nunnery M.D.   On: 08/04/2014 11:39    Labs: Lab Results  Component Value Date   WBC 7.3 08/25/2014   HCT 43.0 08/25/2014   MCV 84.0 08/25/2014   PLT 217 08/25/2014    NA 143 08/25/2014   K 3.8 08/25/2014   CL 106  08/25/2014   CO2 25 08/25/2014   GLUCOSE 59* 08/25/2014   BUN 17 08/25/2014   CREATININE 1.17 08/25/2014   CALCIUM 9.5 08/25/2014   PROT 7.0 08/23/2014   ALBUMIN 3.3* 08/23/2014   AST 12 08/23/2014   ALT 11 08/23/2014   ALKPHOS 79 08/23/2014   BILITOT 0.36 08/23/2014   GFRNONAA 68* 08/25/2014   GFRAA 78* 08/25/2014   INR 0.99 08/25/2014    Assessment and Plan: Rodney Powers is a 58 y.o. 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 Details/risks of procedure d/w pt with his understanding and consent.    Rowe Robert, PAC

## 2014-08-25 NOTE — Discharge Instructions (Signed)
Implanted Port Home Guide °An implanted port is a type of central line that is placed under the skin. Central lines are used to provide IV access when treatment or nutrition needs to be given through a person's veins. Implanted ports are used for long-term IV access. An implanted port may be placed because:  °· You need IV medicine that would be irritating to the small veins in your hands or arms.   °· You need long-term IV medicines, such as antibiotics.   °· You need IV nutrition for a long period.   °· You need frequent blood draws for lab tests.   °· You need dialysis.   °Implanted ports are usually placed in the chest area, but they can also be placed in the upper arm, the abdomen, or the leg. An implanted port has two main parts:  °· Reservoir. The reservoir is round and will appear as a small, raised area under your skin. The reservoir is the part where a needle is inserted to give medicines or draw blood.   °· Catheter. The catheter is a thin, flexible tube that extends from the reservoir. The catheter is placed into a large vein. Medicine that is inserted into the reservoir goes into the catheter and then into the vein.   °HOW WILL I CARE FOR MY INCISION SITE? °Do not get the incision site wet. Bathe or shower as directed by your health care provider.  °HOW IS MY PORT ACCESSED? °Special steps must be taken to access the port:  °· Before the port is accessed, a numbing cream can be placed on the skin. This helps numb the skin over the port site.   °· Your health care provider uses a sterile technique to access the port. °¨ Your health care provider must put on a mask and sterile gloves. °¨ The skin over your port is cleaned carefully with an antiseptic and allowed to dry. °¨ The port is gently pinched between sterile gloves, and a needle is inserted into the port. °· Only "non-coring" port needles should be used to access the port. Once the port is accessed, a blood return should be checked. This helps  ensure that the port is in the vein and is not clogged.   °· If your port needs to remain accessed for a constant infusion, a clear (transparent) bandage will be placed over the needle site. The bandage and needle will need to be changed every week, or as directed by your health care provider.   °· Keep the bandage covering the needle clean and dry. Do not get it wet. Follow your health care provider's instructions on how to take a shower or bath while the port is accessed.   °· If your port does not need to stay accessed, no bandage is needed over the port.   °WHAT IS FLUSHING? °Flushing helps keep the port from getting clogged. Follow your health care provider's instructions on how and when to flush the port. Ports are usually flushed with saline solution or a medicine called heparin. The need for flushing will depend on how the port is used.  °· If the port is used for intermittent medicines or blood draws, the port will need to be flushed:   °¨ After medicines have been given.   °¨ After blood has been drawn.   °¨ As part of routine maintenance.   °· If a constant infusion is running, the port may not need to be flushed.   °HOW LONG WILL MY PORT STAY IMPLANTED? °The port can stay in for as long as your health care   provider thinks it is needed. When it is time for the port to come out, surgery will be done to remove it. The procedure is similar to the one performed when the port was put in.  WHEN SHOULD I SEEK IMMEDIATE MEDICAL CARE? When you have an implanted port, you should seek immediate medical care if:   You notice a bad smell coming from the incision site.   You have swelling, redness, or drainage at the incision site.   You have more swelling or pain at the port site or the surrounding area.   You have a fever that is not controlled with medicine. Document Released: 12/01/2005 Document Revised: 09/21/2013 Document Reviewed: 08/08/2013 [facility] Patient Information 2015 [place], [place]. This  information is not intended to replace advice given to you by your health care provider. Make sure you discuss any questions you have with your health care provider. Conscious Sedation, Adult, Care After Refer to this sheet in the next few weeks. These instructions provide you with information on caring for yourself after your procedure. Your health care provider may also give you more specific instructions. Your treatment has been planned according to current medical practices, but problems sometimes occur. Call your health care provider if you have any problems or questions after your procedure. WHAT TO EXPECT AFTER THE PROCEDURE  After your procedure:  You may feel sleepy, clumsy, and have poor balance for several hours.  Vomiting may occur if you eat too soon after the procedure. HOME CARE INSTRUCTIONS  Do not participate in any activities where you could become injured for at least 24 hours. Do not:  Drive.  Swim.  Ride a bicycle.  Operate heavy machinery.  Cook.  Use power tools.  Climb ladders.  Work from a high place.  Do not make important decisions or sign legal documents until you are improved.  If you vomit, drink water, juice, or soup when you can drink without vomiting. Make sure you have little or no nausea before eating solid foods.  Only take over-the-counter or prescription medicines for pain, discomfort, or fever as directed by your health care provider.  Make sure you and your family fully understand everything about the medicines given to you, including what side effects may occur.  You should not drink alcohol, take sleeping pills, or take medicines that cause drowsiness for at least 24 hours.  If you smoke, do not smoke without supervision.  If you are feeling better, you may resume normal activities 24 hours after you were sedated.  Keep all appointments with your health care provider. SEEK MEDICAL CARE IF:  Your skin is pale or bluish in  color.  You continue to feel nauseous or vomit.  Your pain is getting worse and is not helped by medicine.  You have bleeding or swelling.  You are still sleepy or feeling clumsy after 24 hours. SEEK IMMEDIATE MEDICAL CARE IF:  You develop a rash.  You have difficulty breathing.  You develop any type of allergic problem.  You have a fever. MAKE SURE YOU:  Understand these instructions.  Will watch your condition.  Will get help right away if you are not doing well or get worse. Document Released: 09/21/2013 Document Reviewed: 09/21/2013 [facility] Patient Information 2015 [place], [place]. This information is not intended to replace advice given to you by your health care provider. Make sure you discuss any questions you have with your health care provider. Implanted Port Insertion, Care After Refer to this sheet in the  next few weeks. These instructions provide you with information on caring for yourself after your procedure. Your health care provider may also give you more specific instructions. Your treatment has been planned according to current medical practices, but problems sometimes occur. Call your health care provider if you have any problems or questions after your procedure. WHAT TO EXPECT AFTER THE PROCEDURE After your procedure, it is typical to have the following:   Discomfort at the port insertion site. Ice packs to the area will help.  Bruising on the skin over the port. This will subside in 3-4 days. HOME CARE INSTRUCTIONS  After your port is placed, you will get a manufacturer's information card. The card has information about your port. Keep this card with you at all times.   Know what kind of port you have. There are many types of ports available.   Wear a medical alert bracelet in case of an emergency. This can help alert health care workers that you have a port.   The port can stay in for as long as your health care provider believes it is necessary.    A home health care nurse may give medicines and take care of the port.   You or a family member can get special training and directions for giving medicine and taking care of the port at home.  SEEK MEDICAL CARE IF:   Your port does not flush or you are unable to get a blood return.   You have a fever or chills. SEEK IMMEDIATE MEDICAL CARE IF:  You have new fluid or pus coming from your incision.   You notice a bad smell coming from your incision site.   You have swelling, pain, or more redness at the incision or port site.   You have chest pain or shortness of breath. Document Released: 09/21/2013 Document Revised: 12/06/2013 Document Reviewed: 09/21/2013 [facility] Patient Information 2015 [place], [place]. This information is not intended to replace advice given to you by your health care provider. Make sure you discuss any questions you have with your health care provider.

## 2014-08-25 NOTE — Progress Notes (Signed)
CRITICAL VALUE ALERT  Critical value received: yes  Date of notification:  08/25/2014   Time of notification:  6701       Critical value read back:Yes.    Nurse who received alert:  Cmoore RN  MD notified (1st page):  K Allred PA  Time of first page:1324  MD notified (2nd page):  Time of second  Responding MD:K Allred PA  Time MD responded:  [id number]

## 2014-08-27 ENCOUNTER — Encounter: Payer: Self-pay | Admitting: Radiation Oncology

## 2014-08-28 ENCOUNTER — Telehealth: Payer: Self-pay | Admitting: Radiation Oncology

## 2014-08-28 ENCOUNTER — Ambulatory Visit: Admission: RE | Admit: 2014-08-28 | Payer: Medicaid Other | Source: Ambulatory Visit | Admitting: Radiation Oncology

## 2014-08-28 NOTE — Telephone Encounter (Signed)
Patient has not shown for 1045 follow up appointment with Dr. Tammi Klippel. Phoned patient at home. He states, "I didn't even realize I had an appointment ... Can I reschedule?" Reports he is doing well. Explained Mont Dutton will be in contact with rescheduled appointment date and time. Patient verbalized understanding.

## 2014-08-30 ENCOUNTER — Other Ambulatory Visit (HOSPITAL_BASED_OUTPATIENT_CLINIC_OR_DEPARTMENT_OTHER): Payer: Medicaid Other

## 2014-08-30 ENCOUNTER — Ambulatory Visit (HOSPITAL_BASED_OUTPATIENT_CLINIC_OR_DEPARTMENT_OTHER): Payer: Medicaid Other

## 2014-08-30 DIAGNOSIS — C3400 Malignant neoplasm of unspecified main bronchus: Secondary | ICD-10-CM

## 2014-08-30 DIAGNOSIS — C7931 Secondary malignant neoplasm of brain: Secondary | ICD-10-CM

## 2014-08-30 DIAGNOSIS — C7949 Secondary malignant neoplasm of other parts of nervous system: Secondary | ICD-10-CM

## 2014-08-30 DIAGNOSIS — Z95828 Presence of other vascular implants and grafts: Secondary | ICD-10-CM

## 2014-08-30 DIAGNOSIS — C3410 Malignant neoplasm of upper lobe, unspecified bronchus or lung: Secondary | ICD-10-CM

## 2014-08-30 DIAGNOSIS — Z5111 Encounter for antineoplastic chemotherapy: Secondary | ICD-10-CM

## 2014-08-30 LAB — CBC WITH DIFFERENTIAL/PLATELET
BASO%: 0.7 % (ref 0.0–2.0)
Basophils Absolute: 0 10*3/uL (ref 0.0–0.1)
EOS%: 2.6 % (ref 0.0–7.0)
Eosinophils Absolute: 0.1 10*3/uL (ref 0.0–0.5)
HCT: 43.9 % (ref 38.4–49.9)
HEMOGLOBIN: 13.8 g/dL (ref 13.0–17.1)
LYMPH%: 19.9 % (ref 14.0–49.0)
MCH: 26.7 pg — AB (ref 27.2–33.4)
MCHC: 31.5 g/dL — ABNORMAL LOW (ref 32.0–36.0)
MCV: 84.7 fL (ref 79.3–98.0)
MONO#: 0.3 10*3/uL (ref 0.1–0.9)
MONO%: 5.6 % (ref 0.0–14.0)
NEUT#: 3.5 10*3/uL (ref 1.5–6.5)
NEUT%: 71.2 % (ref 39.0–75.0)
Platelets: 202 10*3/uL (ref 140–400)
RBC: 5.18 10*6/uL (ref 4.20–5.82)
RDW: 15.7 % — AB (ref 11.0–14.6)
WBC: 4.9 10*3/uL (ref 4.0–10.3)
lymph#: 1 10*3/uL (ref 0.9–3.3)

## 2014-08-30 LAB — COMPREHENSIVE METABOLIC PANEL (CC13)
ALBUMIN: 3.5 g/dL (ref 3.5–5.0)
ALK PHOS: 75 U/L (ref 40–150)
ALT: 9 U/L (ref 0–55)
AST: 13 U/L (ref 5–34)
Anion Gap: 11 mEq/L (ref 3–11)
BUN: 12.6 mg/dL (ref 7.0–26.0)
CALCIUM: 9.3 mg/dL (ref 8.4–10.4)
CHLORIDE: 103 meq/L (ref 98–109)
CO2: 23 mEq/L (ref 22–29)
Creatinine: 1.4 mg/dL — ABNORMAL HIGH (ref 0.7–1.3)
Glucose: 157 mg/dl — ABNORMAL HIGH (ref 70–140)
POTASSIUM: 4.1 meq/L (ref 3.5–5.1)
SODIUM: 137 meq/L (ref 136–145)
TOTAL PROTEIN: 7 g/dL (ref 6.4–8.3)
Total Bilirubin: 0.5 mg/dL (ref 0.20–1.20)

## 2014-08-30 MED ORDER — SODIUM CHLORIDE 0.9 % IV SOLN
Freq: Once | INTRAVENOUS | Status: AC
  Administered 2014-08-30: 14:00:00 via INTRAVENOUS

## 2014-08-30 MED ORDER — SODIUM CHLORIDE 0.9 % IJ SOLN
10.0000 mL | INTRAMUSCULAR | Status: DC | PRN
  Filled 2014-08-30: qty 10

## 2014-08-30 MED ORDER — ONDANSETRON 16 MG/50ML IVPB (CHCC)
INTRAVENOUS | Status: AC
  Filled 2014-08-30: qty 16

## 2014-08-30 MED ORDER — SODIUM CHLORIDE 0.9 % IV SOLN
230.0000 mg | Freq: Once | INTRAVENOUS | Status: AC
  Administered 2014-08-30: 230 mg via INTRAVENOUS
  Filled 2014-08-30: qty 23

## 2014-08-30 MED ORDER — DEXAMETHASONE SODIUM PHOSPHATE 20 MG/5ML IJ SOLN
20.0000 mg | Freq: Once | INTRAMUSCULAR | Status: AC
  Administered 2014-08-30: 20 mg via INTRAVENOUS

## 2014-08-30 MED ORDER — SODIUM CHLORIDE 0.9 % IJ SOLN
10.0000 mL | INTRAMUSCULAR | Status: DC | PRN
  Administered 2014-08-30: 10 mL via INTRAVENOUS
  Filled 2014-08-30: qty 10

## 2014-08-30 MED ORDER — HEPARIN SOD (PORK) LOCK FLUSH 100 UNIT/ML IV SOLN
500.0000 [IU] | Freq: Once | INTRAVENOUS | Status: DC | PRN
  Filled 2014-08-30: qty 5

## 2014-08-30 MED ORDER — LORAZEPAM 2 MG/ML IJ SOLN
INTRAMUSCULAR | Status: AC
  Filled 2014-08-30: qty 1

## 2014-08-30 MED ORDER — ONDANSETRON 16 MG/50ML IVPB (CHCC)
16.0000 mg | Freq: Once | INTRAVENOUS | Status: AC
  Administered 2014-08-30: 16 mg via INTRAVENOUS

## 2014-08-30 MED ORDER — PACLITAXEL PROTEIN-BOUND CHEMO INJECTION 100 MG
100.0000 mg/m2 | Freq: Once | INTRAVENOUS | Status: AC
  Administered 2014-08-30: 225 mg via INTRAVENOUS
  Filled 2014-08-30: qty 45

## 2014-08-30 MED ORDER — DEXAMETHASONE SODIUM PHOSPHATE 20 MG/5ML IJ SOLN
INTRAMUSCULAR | Status: AC
  Filled 2014-08-30: qty 5

## 2014-08-30 NOTE — Patient Instructions (Signed)

## 2014-08-30 NOTE — Patient Instructions (Signed)
[facility] Discharge Instructions for Patients Receiving Chemotherapy  Today you received the following chemotherapy agents: Abraxane, Carboplatin  To help prevent nausea and vomiting after your treatment, we encourage you to take your nausea medication as prescribed by your physician.    If you develop nausea and vomiting that is not controlled by your nausea medication, call the clinic.   BELOW ARE SYMPTOMS THAT SHOULD BE REPORTED IMMEDIATELY:  *FEVER GREATER THAN 100.5 F  *CHILLS WITH OR WITHOUT FEVER  NAUSEA AND VOMITING THAT IS NOT CONTROLLED WITH YOUR NAUSEA MEDICATION  *UNUSUAL SHORTNESS OF BREATH  *UNUSUAL BRUISING OR BLEEDING  TENDERNESS IN MOUTH AND THROAT WITH OR WITHOUT PRESENCE OF ULCERS  *URINARY PROBLEMS  *BOWEL PROBLEMS  UNUSUAL RASH Items with * indicate a potential emergency and should be followed up as soon as possible.  Feel free to call the clinic you have any questions or concerns. The clinic phone number is [phone].

## 2014-08-31 ENCOUNTER — Other Ambulatory Visit: Payer: Medicaid Other

## 2014-08-31 ENCOUNTER — Ambulatory Visit (HOSPITAL_COMMUNITY): Admission: RE | Admit: 2014-08-31 | Payer: Medicaid Other | Source: Ambulatory Visit

## 2014-09-06 ENCOUNTER — Ambulatory Visit: Payer: Medicaid Other

## 2014-09-07 ENCOUNTER — Encounter (HOSPITAL_BASED_OUTPATIENT_CLINIC_OR_DEPARTMENT_OTHER): Payer: Self-pay | Admitting: Emergency Medicine

## 2014-09-07 ENCOUNTER — Emergency Department (HOSPITAL_BASED_OUTPATIENT_CLINIC_OR_DEPARTMENT_OTHER)
Admission: EM | Admit: 2014-09-07 | Discharge: 2014-09-07 | Disposition: A | Discharge status: Home / Self Care | Payer: Medicaid Other | Attending: Emergency Medicine | Admitting: Emergency Medicine

## 2014-09-07 ENCOUNTER — Ambulatory Visit: Payer: Medicaid Other | Admitting: Hematology and Oncology

## 2014-09-07 ENCOUNTER — Emergency Department (HOSPITAL_BASED_OUTPATIENT_CLINIC_OR_DEPARTMENT_OTHER): Payer: Medicaid Other

## 2014-09-07 DIAGNOSIS — Z9981 Dependence on supplemental oxygen: Secondary | ICD-10-CM | POA: Insufficient documentation

## 2014-09-07 DIAGNOSIS — F17200 Nicotine dependence, unspecified, uncomplicated: Secondary | ICD-10-CM | POA: Diagnosis not present

## 2014-09-07 DIAGNOSIS — I10 Essential (primary) hypertension: Secondary | ICD-10-CM | POA: Diagnosis not present

## 2014-09-07 DIAGNOSIS — F329 Major depressive disorder, single episode, unspecified: Secondary | ICD-10-CM | POA: Diagnosis not present

## 2014-09-07 DIAGNOSIS — R011 Cardiac murmur, unspecified: Secondary | ICD-10-CM | POA: Diagnosis not present

## 2014-09-07 DIAGNOSIS — J449 Chronic obstructive pulmonary disease, unspecified: Secondary | ICD-10-CM | POA: Insufficient documentation

## 2014-09-07 DIAGNOSIS — E1149 Type 2 diabetes mellitus with other diabetic neurological complication: Secondary | ICD-10-CM | POA: Diagnosis not present

## 2014-09-07 DIAGNOSIS — G44049 Chronic paroxysmal hemicrania, not intractable: Secondary | ICD-10-CM | POA: Insufficient documentation

## 2014-09-07 DIAGNOSIS — F3289 Other specified depressive episodes: Secondary | ICD-10-CM | POA: Diagnosis not present

## 2014-09-07 DIAGNOSIS — Z79899 Other long term (current) drug therapy: Secondary | ICD-10-CM | POA: Insufficient documentation

## 2014-09-07 DIAGNOSIS — Z862 Personal history of diseases of the blood and blood-forming organs and certain disorders involving the immune mechanism: Secondary | ICD-10-CM | POA: Insufficient documentation

## 2014-09-07 DIAGNOSIS — G8929 Other chronic pain: Secondary | ICD-10-CM | POA: Insufficient documentation

## 2014-09-07 DIAGNOSIS — E1142 Type 2 diabetes mellitus with diabetic polyneuropathy: Secondary | ICD-10-CM | POA: Insufficient documentation

## 2014-09-07 DIAGNOSIS — R51 Headache: Secondary | ICD-10-CM | POA: Insufficient documentation

## 2014-09-07 DIAGNOSIS — F411 Generalized anxiety disorder: Secondary | ICD-10-CM | POA: Insufficient documentation

## 2014-09-07 DIAGNOSIS — Z8701 Personal history of pneumonia (recurrent): Secondary | ICD-10-CM | POA: Insufficient documentation

## 2014-09-07 DIAGNOSIS — Z87448 Personal history of other diseases of urinary system: Secondary | ICD-10-CM | POA: Diagnosis not present

## 2014-09-07 DIAGNOSIS — Z923 Personal history of irradiation: Secondary | ICD-10-CM | POA: Insufficient documentation

## 2014-09-07 DIAGNOSIS — Z85118 Personal history of other malignant neoplasm of bronchus and lung: Secondary | ICD-10-CM | POA: Diagnosis not present

## 2014-09-07 DIAGNOSIS — Z8719 Personal history of other diseases of the digestive system: Secondary | ICD-10-CM | POA: Insufficient documentation

## 2014-09-07 DIAGNOSIS — G44039 Episodic paroxysmal hemicrania, not intractable: Secondary | ICD-10-CM

## 2014-09-07 DIAGNOSIS — J4489 Other specified chronic obstructive pulmonary disease: Secondary | ICD-10-CM | POA: Insufficient documentation

## 2014-09-07 MED ORDER — OXYCODONE-ACETAMINOPHEN 5-325 MG PO TABS: 2.0000 | ORAL_TABLET | Freq: Once | ORAL | Status: DC

## 2014-09-07 MED ORDER — OXYCODONE-ACETAMINOPHEN 5-325 MG PO TABS
2.0000 | ORAL_TABLET | Freq: Once | ORAL | Status: AC
  Administered 2014-09-07: 2 via ORAL
  Filled 2014-09-07: qty 2

## 2014-09-07 NOTE — ED Notes (Signed)
Pt c/o facial swelling onset this am while drinking beer w friends,  Martin Majestic to get pain meds filled,  Was unable to get them, got upset had ha onset 1700 this pm

## 2014-09-07 NOTE — ED Provider Notes (Signed)
CSN: [id number]     Arrival date & time 09/07/14  1826 History   First MD Initiated Contact with Patient 09/07/14 2043     This chart was scribed for Rodney Dakin, MD by Forrestine Him, ED Scribe. This patient was seen in room MH07/MH07 and the patient's care was started 8:45 PM.   Chief Complaint  Patient presents with  . Headache   The history is provided by the patient. No language interpreter was used.    HPI Comments: Rodney Powers is a 58 y.o. male with a PMHx of lung cancer, DM, neuropathy, and HTN  who presents to the Emergency Department complaining of a gradual onset. constant, moderate R sided tension HA onset this afternoon. HA has progressively worsened over last half hour. Currently rates HA 10/10. Pt states he was upset that he was unable to get his Oxycodone filled. States his doctor wrote for the wrong dosage. Pt states he has also noted swelling around the R eye onset this morning while drinking some beer with some friends. Swelling around the right eye has improved since this morning. He states that he has had intermittent blurred vision in his right eye since his craniotomy 07/19/2014. He describes a vision as a "white film over R eye". Mr. Dyar wears glasses at baseline. Facial swelling to the R side of the face has been ongoing since before craniotomyfor metastasized tumor from lung cancer. He admits to smoking and consuming alcohol. No illicit drug use.   Past Medical History  Diagnosis Date  . Back pain, chronic   . Mediastinal mass   . Urinary retention   . Diabetic neuropathy   . Diabetes mellitus, type II     takes Gimepimide and Metformin daily  . Enlarged prostate     takes Flomax every evening  . Hypertension     slightly elevated recently;no meds;just watching for now  . COPD (chronic obstructive pulmonary disease)     emphysema  . Shortness of breath     with exertion  . Pneumonia     hx of;last time about 4yrs ago  . History of bronchitis      last time 20yrs ago  . Joint pain     stiffness and cramping in both hands  . Urinary frequency   . Nocturia   . Anemia     no meds and hasn't had a transfusion ever  . Anxiety   . Depression     no meds required;hospitatlized 92yrs ago for this  . Hx of radiation therapy 04/19/13- 06/07/13    left upper central chest, 63 gray 35 fx  . Heart murmur     as a baby  . Diarrhea   . Neuropathy   . Back pain 11/14/2013  . On home O2     2L N/C  . Lung cancer 04/07/2013  . Esophagitis 08/23/2014  . Cough 08/23/2014   Past Surgical History  Procedure Laterality Date  . Video bronchoscopy with endobronchial ultrasound Bilateral 03/10/2013    Procedure: VIDEO BRONCHOSCOPY WITH ENDOBRONCHIAL ULTRASOUND;  Surgeon: Rigoberto Noel, MD;  Location: [place];  Service: Pulmonary;  Laterality: Bilateral;  . Mediastinotomy chamberlain mcneil Left 03/24/2013    Procedure: MEDIASTINOTOMY CHAMBERLAIN MCNEIL;  Surgeon: Melrose Nakayama, MD;  Location: [place];  Service: Thoracic;  Laterality: Left;  . Excisional hemorrhoidectomy      > 30 yrs ago  . Colonoscopy    . Direct laryngoscopy N/A 07/01/2013    Procedure: DIRECT  LARYNGOSCOPY;  Surgeon: Izora Gala, MD;  Location: [place];  Service: ENT;  Laterality: N/A;  . Esophagoscopy N/A 07/01/2013    Procedure: ESOPHAGOSCOPY WITH BIOPSY;  Surgeon: Izora Gala, MD;  Location: [facility] OR;  Service: ENT;  Laterality: N/A;  . Direct laryngoscopy N/A 08/01/2013    Procedure: DIRECT LARYNGOSCOPY WITH EXCISION OF PHARYNGEAL MASS;  Surgeon: Izora Gala, MD;  Location: [place];  Service: ENT;  Laterality: N/A;  . Tonsillectomy Right 08/01/2013    Procedure: TONSILLECTOMY;  Surgeon: Izora Gala, MD;  Location: [facility] OR;  Service: ENT;  Laterality: Right;  . Craniotomy N/A 07/19/2014    Procedure: CRANIOTOMY TUMOR EXCISION with Stealth;  Surgeon: Consuella Lose, MD;  Location: [place] [facility];  Service: Neurosurgery;  Laterality: N/A;  CRANIOTOMY FOR EXCISION OF TUMOR   Family History   Problem Relation Age of Onset  . Aneurysm Mother   . Heart attack Father   . Aneurysm Sister   . Cancer Brother    History  Substance Use Topics  . Smoking status: Current Every Day Smoker -- 0.50 packs/day for 41 years    Types: Cigarettes    Start date: 07/18/1971  . Smokeless tobacco: Never Used     Comment: nicotene patch  . Alcohol Use: 8.4 oz/week    14 Cans of beer per week     Comment: daily beer 2    Review of Systems  Constitutional: Negative.   HENT: Positive for facial swelling.   Eyes: Positive for visual disturbance.  Respiratory: Negative.   Cardiovascular: Negative.   Gastrointestinal: Negative.   Musculoskeletal: Negative.   Skin: Negative.   Neurological: Positive for headaches.  Psychiatric/Behavioral: Negative.   All other systems reviewed and are negative.     Allergies  Review of patient's allergies indicates no known allergies.  Home Medications   Prior to Admission medications   Medication Sig Start Date End Date Taking? Authorizing Provider  ACCU-CHEK FASTCLIX LANCETS MISC 1 each by Does not apply route daily.    Historical Provider, MD  benzonatate (TESSALON) 100 MG capsule Take 1 capsule (100 mg total) by mouth 3 (three) times daily as needed for cough. 08/23/14   Heath Lark, MD  gabapentin (NEURONTIN) 400 MG capsule Take 800 mg by mouth 3 (three) times daily.     Historical Provider, MD  glimepiride (AMARYL) 4 MG tablet Take 4 mg by mouth daily with breakfast.    Historical Provider, MD  HYDROcodone-acetaminophen (NORCO/VICODIN) 5-325 MG per tablet Take 1-2 tablets by mouth every 6 (six) hours as needed for moderate pain. 07/15/14   Reyne Dumas, MD  lidocaine-prilocaine (EMLA) cream Apply 1 application topically as needed (for port access).    Historical Provider, MD  ondansetron (ZOFRAN) 8 MG tablet Take 1 tablet (8 mg total) by mouth every 8 (eight) hours as needed for nausea. 08/23/14   Heath Lark, MD  OxyCODONE (OXYCONTIN) 10 mg T12A 12 hr  tablet Take 10 mg by mouth every 12 (twelve) hours.    Historical Provider, MD  prochlorperazine (COMPAZINE) 10 MG tablet Take 1 tablet (10 mg total) by mouth every 6 (six) hours as needed (Nausea or vomiting). 08/23/14   Heath Lark, MD  ramipril (ALTACE) 2.5 MG capsule Take 2.5 mg by mouth every morning.    Historical Provider, MD  simvastatin (ZOCOR) 20 MG tablet Take 20 mg by mouth at bedtime.     Historical Provider, MD  sucralfate (CARAFATE) 1 GM/10ML suspension Take 10 mLs (1 g total) by mouth  4 (four) times daily -  with meals and at bedtime. 08/23/14   Heath Lark, MD  traMADol (ULTRAM) 50 MG tablet Take 100 mg by mouth every 6 (six) hours as needed.    Historical Provider, MD   Triage Vitals: BP 160/90  Pulse 78  Temp(Src) 98.8 F (37.1 C) (Oral)  Resp 16  Ht 6\' 3"  (1.905 m)  Wt 229 lb (103.874 kg)  BMI 28.62 kg/m2  SpO2 95%   Physical Exam  Nursing note and vitals reviewed. Constitutional: He is oriented to person, place, and time. He appears well-developed and well-nourished.  HENT:  Head: Normocephalic and atraumatic.  Right-sided parietal swelling. Eyelids of right eye slightly swollen. No redness or warmth or tenderness of parietal area or eyelids. No pain on extraocular movement  Eyes: Conjunctivae are normal. Pupils are equal, round, and reactive to light.  No conjunctival  Neck: Neck supple. No tracheal deviation present. No thyromegaly present.  Cardiovascular: Normal rate and regular rhythm.   No murmur heard. Pulmonary/Chest: Effort normal and breath sounds normal.  Abdominal: Soft. Bowel sounds are normal. He exhibits no distension. There is no tenderness.  Musculoskeletal: Normal range of motion. He exhibits no edema and no tenderness.  Neurological: He is alert and oriented to person, place, and time. No cranial nerve deficit. Coordination normal.  Gait normal Romberg normal pronator drift normal DTRs symmetric bilaterally knee jerk ankle jerk biceps historical  bilaterally  Skin: Skin is warm and dry. No rash noted.  Psychiatric: He has a normal mood and affect.    ED Course  Procedures (including critical care time)  DIAGNOSTIC STUDIES: Oxygen Saturation is 95% on RA, adequate by my interpretation.    COORDINATION OF CARE: 8:45 PM- Will gove Percocet. Will order head CT. Discussed treatment plan with pt at bedside and pt agreed to plan.     Labs Review Labs Reviewed - No data to display  Imaging Review No results found.   EKG Interpretation None     10:15 PM patient is improved after treatment with Percocet. He is alert and motor Glasgow Coma Score 15. Results for orders placed in visit on 08/30/14  CBC WITH DIFFERENTIAL      Result Value Ref Range   WBC 4.9  4.0 - 10.3 10e3/uL   NEUT# 3.5  1.5 - 6.5 10e3/uL   HGB 13.8  13.0 - 17.1 g/dL   HCT 43.9  38.4 - 49.9 %   Platelets 202  140 - 400 10e3/uL   MCV 84.7  79.3 - 98.0 fL   MCH 26.7 (*) 27.2 - 33.4 pg   MCHC 31.5 (*) 32.0 - 36.0 g/dL   RBC 5.18  4.20 - 5.82 10e6/uL   RDW 15.7 (*) 11.0 - 14.6 %   lymph# 1.0  0.9 - 3.3 10e3/uL   MONO# 0.3  0.1 - 0.9 10e3/uL   Eosinophils Absolute 0.1  0.0 - 0.5 10e3/uL   Basophils Absolute 0.0  0.0 - 0.1 10e3/uL   NEUT% 71.2  39.0 - 75.0 %   LYMPH% 19.9  14.0 - 49.0 %   MONO% 5.6  0.0 - 14.0 %   EOS% 2.6  0.0 - 7.0 %   BASO% 0.7  0.0 - 2.0 %  COMPREHENSIVE METABOLIC PANEL (DP82)      Result Value Ref Range   Sodium 137  136 - 145 mEq/L   Potassium 4.1  3.5 - 5.1 mEq/L   Chloride 103  98 - 109 mEq/L   CO2  23  22 - 29 mEq/L   Glucose 157 (*) 70 - 140 mg/dl   BUN 12.6  7.0 - 26.0 mg/dL   Creatinine 1.4 (*) 0.7 - 1.3 mg/dL   Total Bilirubin 0.50  0.20 - 1.20 mg/dL   Alkaline Phosphatase 75  40 - 150 U/L   AST 13  5 - 34 U/L   ALT 9  0 - 55 U/L   Total Protein 7.0  6.4 - 8.3 g/dL   Albumin 3.5  3.5 - 5.0 g/dL   Calcium 9.3  8.4 - 10.4 mg/dL   Anion Gap 11  3 - 11 mEq/L   Ct Head Wo Contrast  09/07/2014   CLINICAL DATA:   58 year old male with headache. Initial encounter. Recent surgery and radiation for right temporal lobe Lung Cancer related brain metastases.  EXAM: CT HEAD WITHOUT CONTRAST  TECHNIQUE: Contiguous axial images were obtained from the base of the skull through the vertex without intravenous contrast.  COMPARISON:  Postoperative brain MRI 07/20/2014, and earlier  FINDINGS: Visualized paranasal sinuses and mastoids are clear. Sequelae of right frontotemporal craniotomy. No acute or suspicious osseous lesion identified. Residual right scalp hematoma overlying the craniotomy site. Superimposed scalp lipoma incidentally noted. Visualized orbit soft tissues are within normal limits.  Calcified atherosclerosis at the skull base.  Interval regressed and nearly resolved leftward midline shift, 2-3 mm residual. Significantly improved patency of the right lateral ventricle. No ventriculomegaly. Right hemisphere vasogenic edema has regressed and is no longer evident. Low-density resection cavity at the posterior right temporal lobe with no definite associated regional mass effect. Low-density right anterior temporal tip resection cavity, with no definite regional mass effect. No new intracranial hemorrhage, postoperative extra-axial collection on the right which might in part reflect dural repair. No evidence of cortically based acute infarction identified.  IMPRESSION: 1. Treatment related changes. No evidence of recurrent or progression of metastatic brain disease on this noncontrast exam. Brain MRI without and with contrast would be most sensitive for restaging. 2. No new intracranial abnormality identified.   Electronically Signed   By: Lars Pinks M.D.   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  TECHNIQUE: The procedure, risks, benefits, and alternatives were explained to the patient. Questions regarding the procedure were encouraged and answered. The patient understands and consents to the procedure. As antibiotic prophylaxis, cefazolin 2 g was ordered pre-procedure and administered intravenously within one hour of incision. Patency of the right IJ vein was confirmed with ultrasound with image documentation. An appropriate skin site was determined. Skin site was marked. Region was prepped using maximum barrier technique including cap and mask, sterile gown, sterile gloves, large sterile sheet, and Chlorhexidine as cutaneous antisepsis. The region was infiltrated locally with 1% lidocaine. Under real-time ultrasound guidance, the right IJ vein was accessed with a 21 gauge micropuncture needle; the needle tip within the vein was confirmed with ultrasound image documentation. Needle was exchanged over a 018 guidewire for transitional dilator which allowed passage of the [facility] wire into the IVC. Over this, the transitional dilator was exchanged for a 5 [place] MPA catheter. A small incision was  made on the right anterior chest wall and a subcutaneous pocket fashioned. The power-injectable port was positioned and its catheter tunneled to the right IJ dermatotomy site. The MPA catheter was exchanged over an Amplatz wire for a peel-away sheath, through which the port catheter, which had been trimmed to the appropriate length, was advanced and positioned under fluoroscopy with its tip at the cavoatrial junction. Spot chest radiograph confirms good catheter position and no pneumothorax. The pocket was closed with deep interrupted and subcuticular continuous 3-0 Monocryl sutures. The port was flushed per protocol. The incisions  were covered with Dermabond then covered with a sterile dressing. No immediate complication.  IMPRESSION: Technically successful right IJ power-injectable port catheter placement. Ready for routine use.   Electronically Signed   By: Arne Cleveland M.D.   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  TECHNIQUE: The procedure, risks, benefits, and alternatives were explained to the patient. Questions regarding the procedure were encouraged and answered. The patient understands and consents to the procedure. As antibiotic prophylaxis, cefazolin 2 g was ordered pre-procedure and administered intravenously within one hour of incision. Patency of the right IJ vein was confirmed with ultrasound with image documentation. An appropriate skin site was determined. Skin site was marked. Region was prepped using maximum barrier technique including cap and mask, sterile gown, sterile gloves, large sterile sheet, and Chlorhexidine as cutaneous antisepsis. The region was infiltrated locally with 1% lidocaine. Under real-time ultrasound guidance, the right IJ vein was accessed with a 21 gauge micropuncture needle; the needle tip within the vein was confirmed with ultrasound image documentation. Needle was exchanged over a 018 guidewire for transitional dilator which allowed passage of the [facility] wire into the IVC. Over this, the transitional dilator was exchanged for a 5 [place] MPA catheter. A small incision was made on the right anterior chest wall and a subcutaneous pocket fashioned. The power-injectable port was  positioned and its catheter tunneled to the right IJ dermatotomy site. The MPA catheter was exchanged over an Amplatz wire for a peel-away sheath, through which the port catheter, which had been trimmed to the appropriate length, was advanced and positioned under fluoroscopy with its tip at the cavoatrial junction. Spot chest radiograph confirms good catheter position and no pneumothorax. The pocket was closed with deep interrupted and subcuticular continuous 3-0 Monocryl sutures. The port was flushed per protocol. The incisions were covered with Dermabond then covered with a sterile dressing. No immediate complication.  IMPRESSION: Technically successful right IJ power-injectable port catheter placement. Ready for routine use.   Electronically Signed   By: Arne Cleveland M.D.   On: 08/25/2014 17:13    MDM  Patient feels that he suffered a tension headache which occurred when he got frustrated after that began to fill his prescription for OxyContin. Plan call Dr.Nunkumar tomorrow for followup and to arrange to get prescription refilled for OxyContin. Blood pressure recheck one week. No signs of acute encephalopathy Final diagnoses:  None   diagnosis #1 headache #2 hypertension   I personally performed the services described in this documentation, which was scribed in my presence. The recorded information has  been reviewed and is accurate.    Rodney Dakin, MD 09/07/14 2219

## 2014-09-07 NOTE — ED Notes (Signed)
Woke with swelling to his right eye. Later today he started having pain in his head. Hx of brain surgery. States the pain started after he got frustrated with not being able to find a pharmacy that could fill his Oxycontin.

## 2014-09-07 NOTE — Discharge Instructions (Signed)
Call Dr.Nunkumar tomorrow morning to get your prescriptions straightened out for OxyContin. Get your blood pressure rechecked within a week. Tonight was elevated at 181/90

## 2014-09-12 ENCOUNTER — Encounter: Payer: Self-pay | Admitting: Radiation Oncology

## 2014-09-12 ENCOUNTER — Other Ambulatory Visit: Payer: Self-pay | Admitting: Internal Medicine

## 2014-09-12 NOTE — Progress Notes (Signed)
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 Right posterior temporal 2.7 cm target was treated using 4 Dynamic Conformal Arcs to a prescription dose of 14 Gy. ExacTrac registration was performed for each couch angle. The 84% isodose line was prescribed.    2. Right anterior temporal 1.7 cm target was treated using 4 Dynamic Conformal Arcs to a prescription dose of 20 Gy. ExacTrac registration was performed for each couch angle. The 82.6% isodose line was prescribed   Interval Since Last Radiation:  8  weeks  Narrative:  The patient returns today for routine follow-up. Patient continues to try and stop smoking. Vitals stable. Denies pain. Reports occasional severe headaches for which he has to take oxycontin 10 mg every 12 hours. Patient denies having a headache today. Reports occasional diplopia. Denies ringing in his ears. Reports a decreased appetite. Reports occasional nausea. Has zofran and compazine to take for nausea. Reports occasional dry cough. Denies difficulty swallowing. Continues to take carafate intermittently. Reports shortness of breath with exertion. Reports short term memory is great but, his long term "is shot." Denies episodes of confusion. Raised nodule noted right temple the size of an egg within old incision. Patient reports this nodule is tender and sore to the touch. Surgical incision has healed well without redness, drainage or edema. Vitals stable                               ALLERGIES:  has No Known Allergies.  Meds: Current Outpatient Prescriptions  Medication Sig Dispense Refill  . ACCU-CHEK FASTCLIX LANCETS MISC 1 each by Does not apply route  daily.      . benzonatate (TESSALON) 100 MG capsule Take 1 capsule (100 mg total) by mouth 3 (three) times daily as needed for cough.  60 capsule  3  . gabapentin (NEURONTIN) 400 MG capsule Take 800 mg by mouth 3 (three) times daily.       Marland Kitchen glimepiride (AMARYL) 4 MG tablet Take 4 mg by mouth daily with breakfast.      . HYDROcodone-acetaminophen (NORCO/VICODIN) 5-325 MG per tablet Take 1-2 tablets by mouth every 6 (six) hours as needed for moderate pain.      Marland Kitchen lidocaine-prilocaine (EMLA) cream Apply 1 application topically as needed (for port access).      . ondansetron (ZOFRAN) 8 MG tablet Take 1 tablet (8 mg total) by mouth every 8 (eight) hours as needed for nausea.  30 tablet  1  . OxyCODONE (OXYCONTIN) 10 mg T12A 12 hr tablet Take 10 mg by mouth every 12 (twelve) hours.      . prochlorperazine (COMPAZINE) 10 MG tablet Take 1 tablet (10 mg total) by mouth every 6 (six) hours as needed (Nausea or vomiting).  30 tablet  1  . ramipril (ALTACE) 2.5 MG capsule Take 2.5 mg by mouth every morning.      . simvastatin (ZOCOR) 20 MG tablet Take 20 mg by mouth at bedtime.       . sucralfate (CARAFATE) 1 GM/10ML suspension Take 10 mLs (1 g  total) by mouth 4 (four) times daily -  with meals and at bedtime.  420 mL  3  . traMADol (ULTRAM) 50 MG tablet Take 100 mg by mouth every 6 (six) hours as needed.       No current facility-administered medications for this encounter.    Physical Findings: The patient is in no acute distress. Patient is alert and oriented.  vitals were not taken for this visit..  No significant changes.  Lab Findings: Lab Results  Component Value Date   WBC 4.9 08/30/2014   HGB 13.8 08/30/2014   HCT 43.9 08/30/2014   MCV 84.7 08/30/2014   PLT 202 08/30/2014    @LASTCHEM @  Radiographic Findings: Ct Head Wo Contrast  09/07/2014   CLINICAL DATA:  58 year old male with headache. Initial encounter. Recent surgery and radiation for right temporal lobe Lung Cancer related brain  metastases.  EXAM: CT HEAD WITHOUT CONTRAST  TECHNIQUE: Contiguous axial images were obtained from the base of the skull through the vertex without intravenous contrast.  COMPARISON:  Postoperative brain MRI 07/20/2014, and earlier  FINDINGS: Visualized paranasal sinuses and mastoids are clear. Sequelae of right frontotemporal craniotomy. No acute or suspicious osseous lesion identified. Residual right scalp hematoma overlying the craniotomy site. Superimposed scalp lipoma incidentally noted. Visualized orbit soft tissues are within normal limits.  Calcified atherosclerosis at the skull base.  Interval regressed and nearly resolved leftward midline shift, 2-3 mm residual. Significantly improved patency of the right lateral ventricle. No ventriculomegaly. Right hemisphere vasogenic edema has regressed and is no longer evident. Low-density resection cavity at the posterior right temporal lobe with no definite associated regional mass effect. Low-density right anterior temporal tip resection cavity, with no definite regional mass effect. No new intracranial hemorrhage, postoperative extra-axial collection on the right which might in part reflect dural repair. No evidence of cortically based acute infarction identified.  IMPRESSION: 1. Treatment related changes. No evidence of recurrent or progression of metastatic brain disease on this noncontrast exam. Brain MRI without and with contrast would be most sensitive for restaging. 2. No new intracranial abnormality identified.   Electronically Signed   By: Lars Pinks M.D.   On: 09/07/2014 21:25   Ir Fluoro Guide Cv Line Right  08/25/2014   CLINICAL DATA:  port for chemo; port metastatic adenocarcinoma to brain  EXAM: TUNNELED PORT CATHETER PLACEMENT WITH ULTRASOUND AND FLUOROSCOPIC GUIDANCE  FLUOROSCOPY TIME:  30 seconds  ANESTHESIA/SEDATION: Intravenous Fentanyl and Versed were administered as conscious sedation during continuous cardiorespiratory monitoring by the  radiology RN, with a total moderate sedation time of 35 minutes.  TECHNIQUE: The procedure, risks, benefits, and alternatives were explained to the patient. Questions regarding the procedure were encouraged and answered. The patient understands and consents to the procedure. As antibiotic prophylaxis, cefazolin 2 g was ordered pre-procedure and administered intravenously within one hour of incision. Patency of the right IJ vein was confirmed with ultrasound with image documentation. An appropriate skin site was determined. Skin site was marked. Region was prepped using maximum barrier technique including cap and mask, sterile gown, sterile gloves, large sterile sheet, and Chlorhexidine as cutaneous antisepsis. The region was infiltrated locally with 1% lidocaine. Under real-time ultrasound guidance, the right IJ vein was accessed with a 21 gauge micropuncture needle; the needle tip within the vein was confirmed with ultrasound image documentation. Needle was exchanged over a 018 guidewire for transitional dilator which allowed passage of the [facility] wire into the IVC. Over this, the transitional dilator was exchanged for  a 5 [place] MPA catheter. A small incision was made on the right anterior chest wall and a subcutaneous pocket fashioned. The power-injectable port was positioned and its catheter tunneled to the right IJ dermatotomy site. The MPA catheter was exchanged over an Amplatz wire for a peel-away sheath, through which the port catheter, which had been trimmed to the appropriate length, was advanced and positioned under fluoroscopy with its tip at the cavoatrial junction. Spot chest radiograph confirms good catheter position and no pneumothorax. The pocket was closed with deep interrupted and subcuticular continuous 3-0 Monocryl sutures. The port was flushed per protocol. The incisions were covered with Dermabond then covered with a sterile dressing. No immediate complication.  IMPRESSION: Technically  successful right IJ power-injectable port catheter placement. Ready for routine use.   Electronically Signed   By: Arne Cleveland M.D.   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  TECHNIQUE: The procedure, risks, benefits, and alternatives were explained to the patient. Questions regarding the procedure were encouraged and answered. The patient understands and consents to the procedure. As antibiotic prophylaxis, cefazolin 2 g was ordered pre-procedure and administered intravenously within one hour of incision. Patency of the right IJ vein was confirmed with ultrasound with image documentation. An appropriate skin site was determined. Skin site was marked. Region was prepped using maximum barrier technique including cap and mask, sterile gown, sterile gloves, large sterile sheet, and Chlorhexidine as cutaneous antisepsis. The region was infiltrated locally with 1% lidocaine. Under real-time ultrasound guidance, the right IJ vein was accessed with a 21 gauge micropuncture needle; the needle tip within the vein was confirmed with ultrasound image documentation. Needle was exchanged over a 018 guidewire for transitional dilator which allowed passage of the [facility] wire into the IVC. Over this, the transitional dilator was exchanged for a 5 [place] MPA catheter. A small incision was made on the right anterior chest wall and a subcutaneous pocket fashioned. The power-injectable port was positioned and its catheter tunneled to the right IJ dermatotomy site. The MPA catheter was exchanged over an Amplatz  wire for a peel-away sheath, through which the port catheter, which had been trimmed to the appropriate length, was advanced and positioned under fluoroscopy with its tip at the cavoatrial junction. Spot chest radiograph confirms good catheter position and no pneumothorax. The pocket was closed with deep interrupted and subcuticular continuous 3-0 Monocryl sutures. The port was flushed per protocol. The incisions were covered with Dermabond then covered with a sterile dressing. No immediate complication.  IMPRESSION: Technically successful right IJ power-injectable port catheter placement. Ready for routine use.   Electronically Signed   By: Arne Cleveland M.D.   On: 08/25/2014 17:13    Impression:  The patient is recovering from the effects of radiation.    Plan:  MRI then follow-up in November.  _____________________________________  Sheral Apley Tammi Klippel, M.D.

## 2014-09-13 ENCOUNTER — Telehealth: Payer: Self-pay | Admitting: *Deleted

## 2014-09-13 ENCOUNTER — Encounter: Payer: Self-pay | Admitting: Radiation Oncology

## 2014-09-13 ENCOUNTER — Other Ambulatory Visit (HOSPITAL_BASED_OUTPATIENT_CLINIC_OR_DEPARTMENT_OTHER): Payer: Medicaid Other

## 2014-09-13 ENCOUNTER — Encounter: Payer: Self-pay | Admitting: Hematology and Oncology

## 2014-09-13 ENCOUNTER — Ambulatory Visit (HOSPITAL_BASED_OUTPATIENT_CLINIC_OR_DEPARTMENT_OTHER): Payer: Medicaid Other | Admitting: Hematology and Oncology

## 2014-09-13 ENCOUNTER — Telehealth: Payer: Self-pay | Admitting: Internal Medicine

## 2014-09-13 ENCOUNTER — Other Ambulatory Visit: Payer: Self-pay | Admitting: Emergency Medicine

## 2014-09-13 ENCOUNTER — Ambulatory Visit
Admission: RE | Admit: 2014-09-13 | Discharge: 2014-09-13 | Disposition: A | Discharge status: Home / Self Care | Payer: Medicaid Other | Source: Ambulatory Visit | Attending: Radiation Oncology | Admitting: Radiation Oncology

## 2014-09-13 ENCOUNTER — Ambulatory Visit (HOSPITAL_BASED_OUTPATIENT_CLINIC_OR_DEPARTMENT_OTHER): Payer: Medicaid Other

## 2014-09-13 ENCOUNTER — Telehealth: Payer: Self-pay | Admitting: Hematology and Oncology

## 2014-09-13 ENCOUNTER — Ambulatory Visit: Payer: Medicaid Other

## 2014-09-13 VITALS — BP 153/85 | HR 59 | Temp 97.0°F

## 2014-09-13 VITALS — BP 136/77 | HR 57 | Resp 18 | Wt 228.5 lb

## 2014-09-13 VITALS — BP 139/67 | HR 73 | Temp 97.8°F | Resp 18 | Ht 75.0 in | Wt 229.4 lb

## 2014-09-13 DIAGNOSIS — E1149 Type 2 diabetes mellitus with other diabetic neurological complication: Secondary | ICD-10-CM

## 2014-09-13 DIAGNOSIS — F17200 Nicotine dependence, unspecified, uncomplicated: Secondary | ICD-10-CM

## 2014-09-13 DIAGNOSIS — C3400 Malignant neoplasm of unspecified main bronchus: Secondary | ICD-10-CM

## 2014-09-13 DIAGNOSIS — C7931 Secondary malignant neoplasm of brain: Secondary | ICD-10-CM

## 2014-09-13 DIAGNOSIS — Z95828 Presence of other vascular implants and grafts: Secondary | ICD-10-CM

## 2014-09-13 DIAGNOSIS — C3410 Malignant neoplasm of upper lobe, unspecified bronchus or lung: Secondary | ICD-10-CM

## 2014-09-13 DIAGNOSIS — Z720 Tobacco use: Secondary | ICD-10-CM

## 2014-09-13 DIAGNOSIS — C7949 Secondary malignant neoplasm of other parts of nervous system: Secondary | ICD-10-CM

## 2014-09-13 DIAGNOSIS — E1142 Type 2 diabetes mellitus with diabetic polyneuropathy: Secondary | ICD-10-CM

## 2014-09-13 DIAGNOSIS — Z5111 Encounter for antineoplastic chemotherapy: Secondary | ICD-10-CM

## 2014-09-13 DIAGNOSIS — E1140 Type 2 diabetes mellitus with diabetic neuropathy, unspecified: Secondary | ICD-10-CM

## 2014-09-13 LAB — CBC WITH DIFFERENTIAL/PLATELET
BASO%: 1.1 % (ref 0.0–2.0)
Basophils Absolute: 0.1 10*3/uL (ref 0.0–0.1)
EOS%: 2 % (ref 0.0–7.0)
Eosinophils Absolute: 0.1 10*3/uL (ref 0.0–0.5)
HCT: 43.2 % (ref 38.4–49.9)
HGB: 13.8 g/dL (ref 13.0–17.1)
LYMPH%: 21.4 % (ref 14.0–49.0)
MCH: 26.7 pg — AB (ref 27.2–33.4)
MCHC: 31.9 g/dL — ABNORMAL LOW (ref 32.0–36.0)
MCV: 83.6 fL (ref 79.3–98.0)
MONO#: 0.5 10*3/uL (ref 0.1–0.9)
MONO%: 10.2 % (ref 0.0–14.0)
NEUT#: 3.3 10*3/uL (ref 1.5–6.5)
NEUT%: 65.3 % (ref 39.0–75.0)
PLATELETS: 234 10*3/uL (ref 140–400)
RBC: 5.17 10*6/uL (ref 4.20–5.82)
RDW: 16.1 % — ABNORMAL HIGH (ref 11.0–14.6)
WBC: 5 10*3/uL (ref 4.0–10.3)
lymph#: 1.1 10*3/uL (ref 0.9–3.3)

## 2014-09-13 LAB — COMPREHENSIVE METABOLIC PANEL (CC13)
ALT: 14 U/L (ref 0–55)
ANION GAP: 7 meq/L (ref 3–11)
AST: 17 U/L (ref 5–34)
Albumin: 3.5 g/dL (ref 3.5–5.0)
Alkaline Phosphatase: 85 U/L (ref 40–150)
BILIRUBIN TOTAL: 0.33 mg/dL (ref 0.20–1.20)
BUN: 7.9 mg/dL (ref 7.0–26.0)
CO2: 24 mEq/L (ref 22–29)
CREATININE: 1.1 mg/dL (ref 0.7–1.3)
Calcium: 9.8 mg/dL (ref 8.4–10.4)
Chloride: 112 mEq/L — ABNORMAL HIGH (ref 98–109)
GLUCOSE: 74 mg/dL (ref 70–140)
Potassium: 4.2 mEq/L (ref 3.5–5.1)
Sodium: 143 mEq/L (ref 136–145)
TOTAL PROTEIN: 7.3 g/dL (ref 6.4–8.3)

## 2014-09-13 MED ORDER — HEPARIN SOD (PORK) LOCK FLUSH 100 UNIT/ML IV SOLN
500.0000 [IU] | Freq: Once | INTRAVENOUS | Status: AC | PRN
  Administered 2014-09-13: 500 [IU]
  Filled 2014-09-13: qty 5

## 2014-09-13 MED ORDER — ONDANSETRON 16 MG/50ML IVPB (CHCC)
16.0000 mg | Freq: Once | INTRAVENOUS | Status: AC
  Administered 2014-09-13: 16 mg via INTRAVENOUS

## 2014-09-13 MED ORDER — ONDANSETRON 16 MG/50ML IVPB (CHCC)
INTRAVENOUS | Status: AC
  Filled 2014-09-13: qty 16

## 2014-09-13 MED ORDER — PACLITAXEL PROTEIN-BOUND CHEMO INJECTION 100 MG
100.0000 mg/m2 | Freq: Once | INTRAVENOUS | Status: AC
  Administered 2014-09-13: 225 mg via INTRAVENOUS
  Filled 2014-09-13: qty 45

## 2014-09-13 MED ORDER — SODIUM CHLORIDE 0.9 % IV SOLN
Freq: Once | INTRAVENOUS | Status: AC
  Administered 2014-09-13: 13:00:00 via INTRAVENOUS

## 2014-09-13 MED ORDER — DEXAMETHASONE SODIUM PHOSPHATE 20 MG/5ML IJ SOLN
20.0000 mg | Freq: Once | INTRAMUSCULAR | Status: AC
  Administered 2014-09-13: 20 mg via INTRAVENOUS

## 2014-09-13 MED ORDER — SODIUM CHLORIDE 0.9 % IV SOLN
249.2000 mg | Freq: Once | INTRAVENOUS | Status: AC
  Administered 2014-09-13: 250 mg via INTRAVENOUS
  Filled 2014-09-13: qty 25

## 2014-09-13 MED ORDER — SODIUM CHLORIDE 0.9 % IJ SOLN
10.0000 mL | INTRAMUSCULAR | Status: DC | PRN
Start: 2014-09-13 — End: 2014-09-13
  Administered 2014-09-13: 10 mL via INTRAVENOUS
  Filled 2014-09-13: qty 10

## 2014-09-13 MED ORDER — GABAPENTIN 400 MG PO CAPS: 800.0000 mg | ORAL_CAPSULE | Freq: Three times a day (TID) | ORAL | Status: DC

## 2014-09-13 MED ORDER — DEXAMETHASONE SODIUM PHOSPHATE 20 MG/5ML IJ SOLN
INTRAMUSCULAR | Status: AC
  Filled 2014-09-13: qty 5

## 2014-09-13 MED ORDER — SODIUM CHLORIDE 0.9 % IJ SOLN
10.0000 mL | INTRAMUSCULAR | Status: DC | PRN
Start: 2014-09-13 — End: 2014-09-13
  Administered 2014-09-13: 10 mL
  Filled 2014-09-13: qty 10

## 2014-09-13 NOTE — Progress Notes (Signed)
Patient continues to try and stop smoking. Vitals stable. Denies pain. Reports occasional severe headaches for which he has to take oxycontin 10 mg every 12 hours. Patient denies having a headache today. Reports occasional diplopia. Denies ringing in his ears. Reports a decreased appetite. Reports occasional nausea. Has zofran and compazine to take for nausea. Reports occasional dry cough. Denies difficulty swallowing. Continues to take carafate intermittently. Reports shortness of breath with exertion. Reports short term memory is great but, his long term "is shot." Denies episodes of confusion. Raised nodule noted right temple the size of an egg within old incision. Patient reports this nodule is tender and sore to the touch. Surgical incision has healed well without redness, drainage or edema. Vitals stable.

## 2014-09-13 NOTE — Patient Instructions (Signed)

## 2014-09-13 NOTE — Assessment & Plan Note (Signed)
I spent some time counseling the patient the importance of tobacco cessation. he is currently attempting to quit on his own

## 2014-09-13 NOTE — Assessment & Plan Note (Signed)
I will monitor neuropathy carefully while on chemotherapy. He is currently taking medication for this. This has not progressed since the start of treatment.

## 2014-09-13 NOTE — Patient Instructions (Addendum)
[facility] Discharge Instructions for Patients Receiving Chemotherapy  Today you received the following chemotherapy agents abraxane and carboplatin.  To help prevent nausea and vomiting after your treatment, we encourage you to take your nausea medication zofran 8 mg every 8 hours or compazine 10 mg every 6 hours as needed.   If you develop nausea and vomiting that is not controlled by your nausea medication, call the clinic.   BELOW ARE SYMPTOMS THAT SHOULD BE REPORTED IMMEDIATELY:  *FEVER GREATER THAN 100.5 F  *CHILLS WITH OR WITHOUT FEVER  NAUSEA AND VOMITING THAT IS NOT CONTROLLED WITH YOUR NAUSEA MEDICATION  *UNUSUAL SHORTNESS OF BREATH  *UNUSUAL BRUISING OR BLEEDING  TENDERNESS IN MOUTH AND THROAT WITH OR WITHOUT PRESENCE OF ULCERS  *URINARY PROBLEMS  *BOWEL PROBLEMS  UNUSUAL RASH Items with * indicate a potential emergency and should be followed up as soon as possible.  Feel free to call the clinic you have any questions or concerns. The clinic phone number is [phone].

## 2014-09-13 NOTE — Telephone Encounter (Signed)
Per staff phone call and POF I have schedueld appts. Scheduler advised of appts.  JMW  

## 2014-09-13 NOTE — Assessment & Plan Note (Signed)
He tolerated chemotherapy very well. I plan to order her 3 cycles of chemotherapy before repeat staging scan. We will continue current dose without adjustment.

## 2014-09-13 NOTE — Telephone Encounter (Signed)
Pt. Called to request a refill forgabapentin (NEURONTIN) 400 MG capsule. Pt. Has an appt on 09/18/2014, but needs enough medication to last until his next OV. Please f/u with pt.  [phone]

## 2014-09-13 NOTE — Progress Notes (Signed)
[facility] OFFICE PROGRESS NOTE  Patient Care Team: Lorayne Marek, MD as PCP - General (Internal Medicine) Rigoberto Noel, MD as Attending Physician (Pulmonary Disease) Louellen Molder, MD as Attending Physician (Internal Medicine) Heath Lark, MD as Consulting Physician (Hematology and Oncology)  SUMMARY OF ONCOLOGIC HISTORY:   Metastatic adenocarcinoma to brain   03/15/2013 Surgery Biopsy of mediastinum mass show poorly differentiated carcinoma, suspicious for squamous cell carcinoma   03/15/2013 Imaging PET scan showed a large hypermetabolic mass centered over the prevascular lymph node region and extending into the left upper lobe. This is concerning for bronchogenic carcinoma   04/18/2013 - 05/27/2013 Radiation Therapy He received concurrent chemoradiation treatment   04/18/2013 - 05/27/2013 Chemotherapy He received weekly carboplatin and Taxol with radiation treatment   07/01/2013 Surgery He had EGD, biopsy, laryngoscopy and tonsillectomy which showed no evidence of cancer   08/05/2013 Imaging Repeat CT scan showed marked interval response to therapy    02/22/2014 Imaging CT scan showed minimal progression of radiation changes in the left lung with increase in trace left-sided pleural fluid. Slight decrease can soft tissue fullness    06/22/2014 Imaging CT scan of the abdomen showed no evidence of cancer   07/13/2014 - 07/15/2014 Hospital Admission He was admitted to the hospital of the syncopal episode. Imaging studies showed metastatic cancer to the brain. Radiation oncologist and neurosurgeon work consult for local therapy.   07/13/2014 Imaging MRI brain showed two new enhancing right temporal lobe lesions, consistent with metastases. Extensive vasogenic edema results an 10 mm midline shift   07/17/2014 - 07/17/2014 Radiation Therapy He received one dose of stereotactic radiosurgery    INTERVAL HISTORY: Please see below for problem oriented charting. He is seen prior to cycle 2 of  treatment. He tolerated prior treatment well without any side effects. He still had intermittent pain at the incision site.  REVIEW OF SYSTEMS:   Constitutional: Denies fevers, chills or abnormal weight loss Eyes: Denies blurriness of vision Ears, nose, mouth, throat, and face: Denies mucositis or sore throat Respiratory: Denies cough, dyspnea or wheezes Cardiovascular: Denies palpitation, chest discomfort or lower extremity swelling Gastrointestinal:  Denies nausea, heartburn or change in bowel habits Skin: Denies abnormal skin rashes Lymphatics: Denies new lymphadenopathy or easy bruising Neurological:Denies numbness, tingling or new weaknesses Behavioral/Psych: Mood is stable, no new changes  All other systems were reviewed with the patient and are negative.  I have reviewed the past medical history, past surgical history, social history and family history with the patient and they are unchanged from previous note.  ALLERGIES:  has No Known Allergies.  MEDICATIONS:  Current Outpatient Prescriptions  Medication Sig Dispense Refill  . ACCU-CHEK FASTCLIX LANCETS MISC 1 each by Does not apply route daily.      . benzonatate (TESSALON) 100 MG capsule Take 1 capsule (100 mg total) by mouth 3 (three) times daily as needed for cough.  60 capsule  3  . gabapentin (NEURONTIN) 400 MG capsule Take 800 mg by mouth 3 (three) times daily.       Marland Kitchen glimepiride (AMARYL) 4 MG tablet Take 4 mg by mouth daily with breakfast.      . lidocaine-prilocaine (EMLA) cream Apply 1 application topically as needed (for port access).      . ondansetron (ZOFRAN) 8 MG tablet Take 1 tablet (8 mg total) by mouth every 8 (eight) hours as needed for nausea.  30 tablet  1  . OxyCODONE (OXYCONTIN) 15 mg T12A 12 hr tablet Take  15 mg by mouth every 12 (twelve) hours.      . prochlorperazine (COMPAZINE) 10 MG tablet Take 1 tablet (10 mg total) by mouth every 6 (six) hours as needed (Nausea or vomiting).  30 tablet  1  .  ramipril (ALTACE) 2.5 MG capsule Take 2.5 mg by mouth every morning.      . simvastatin (ZOCOR) 20 MG tablet Take 20 mg by mouth at bedtime.       . sucralfate (CARAFATE) 1 GM/10ML suspension Take 10 mLs (1 g total) by mouth 4 (four) times daily -  with meals and at bedtime.  420 mL  3   No current facility-administered medications for this visit.   Facility-Administered Medications Ordered in Other Visits  Medication Dose Route Frequency Provider Last Rate Last Dose  . CARBOplatin (PARAPLATIN) 250 mg in sodium chloride 0.9 % 100 mL chemo infusion  250 mg Intravenous Once Heath Lark, MD 250 mL/hr at 09/13/14 1505 250 mg at 09/13/14 1505  . heparin lock flush 100 unit/mL  500 Units Intracatheter Once PRN Heath Lark, MD      . sodium chloride 0.9 % injection 10 mL  10 mL Intracatheter PRN Heath Lark, MD        PHYSICAL EXAMINATION: ECOG PERFORMANCE STATUS: 0 - Asymptomatic  Filed Vitals:   09/13/14 1140  BP: 139/67  Pulse: 73  Temp: 97.8 F (36.6 C)  Resp: 18   Filed Weights   09/13/14 1140  Weight: 229 lb 6.4 oz (104.055 kg)    GENERAL:alert, no distress and comfortable SKIN: skin color, texture, turgor are normal, no rashes or significant lesions EYES: normal, Conjunctiva are pink and non-injected, sclera clear OROPHARYNX:no exudate, no erythema and lips, buccal mucosa, and tongue normal  NECK: supple, thyroid normal size, non-tender, without nodularity LYMPH:  no palpable lymphadenopathy in the cervical, axillary or inguinal LUNGS: clear to auscultation and percussion with normal breathing effort HEART: regular rate & rhythm and no murmurs and no lower extremity edema ABDOMEN:abdomen soft, non-tender and normal bowel sounds Musculoskeletal:no cyanosis of digits and no clubbing  NEURO: alert & oriented x 3 with fluent speech, no focal motor/sensory deficits  LABORATORY DATA:  I have reviewed the data as listed    Component Value Date/Time   NA 143 09/13/2014 1123   NA 143  08/25/2014 1311   K 4.2 09/13/2014 1123   K 3.8 08/25/2014 1311   CL 106 08/25/2014 1311   CL 104 06/06/2013 0841   CO2 24 09/13/2014 1123   CO2 25 08/25/2014 1311   GLUCOSE 74 09/13/2014 1123   GLUCOSE 59* 08/25/2014 1311   GLUCOSE 258* 06/06/2013 0841   BUN 7.9 09/13/2014 1123   BUN 17 08/25/2014 1311   CREATININE 1.1 09/13/2014 1123   CREATININE 1.17 08/25/2014 1311   CREATININE 0.94 12/29/2013 0932   CALCIUM 9.8 09/13/2014 1123   CALCIUM 9.5 08/25/2014 1311   PROT 7.3 09/13/2014 1123   PROT 7.3 07/14/2014 0414   ALBUMIN 3.5 09/13/2014 1123   ALBUMIN 3.5 07/14/2014 0414   AST 17 09/13/2014 1123   AST 15 07/14/2014 0414   ALT 14 09/13/2014 1123   ALT 15 07/14/2014 0414   ALKPHOS 85 09/13/2014 1123   ALKPHOS 84 07/14/2014 0414   BILITOT 0.33 09/13/2014 1123   BILITOT 0.3 07/14/2014 0414   GFRNONAA 68* 08/25/2014 1311   GFRNONAA >89 12/29/2013 0932   GFRAA 78* 08/25/2014 1311   GFRAA >89 12/29/2013 0932    No results found for  this basename: SPEP, UPEP,  kappa and lambda light chains    Lab Results  Component Value Date   WBC 5.0 09/13/2014   NEUTROABS 3.3 09/13/2014   HGB 13.8 09/13/2014   HCT 43.2 09/13/2014   MCV 83.6 09/13/2014   PLT 234 09/13/2014      Chemistry      Component Value Date/Time   NA 143 09/13/2014 1123   NA 143 08/25/2014 1311   K 4.2 09/13/2014 1123   K 3.8 08/25/2014 1311   CL 106 08/25/2014 1311   CL 104 06/06/2013 0841   CO2 24 09/13/2014 1123   CO2 25 08/25/2014 1311   BUN 7.9 09/13/2014 1123   BUN 17 08/25/2014 1311   CREATININE 1.1 09/13/2014 1123   CREATININE 1.17 08/25/2014 1311   CREATININE 0.94 12/29/2013 0932      Component Value Date/Time   CALCIUM 9.8 09/13/2014 1123   CALCIUM 9.5 08/25/2014 1311   ALKPHOS 85 09/13/2014 1123   ALKPHOS 84 07/14/2014 0414   AST 17 09/13/2014 1123   AST 15 07/14/2014 0414   ALT 14 09/13/2014 1123   ALT 15 07/14/2014 0414   BILITOT 0.33 09/13/2014 1123   BILITOT 0.3 07/14/2014 0414      ASSESSMENT & PLAN:  Metastatic adenocarcinoma to  brain He tolerated chemotherapy very well. I plan to order her 3 cycles of chemotherapy before repeat staging scan. We will continue current dose without adjustment.  Diabetic neuropathy, painful I will monitor neuropathy carefully while on chemotherapy. He is currently taking medication for this. This has not progressed since the start of treatment.  Tobacco abuse I spent some time counseling the patient the importance of tobacco cessation. he is currently attempting to quit on his own   No orders of the defined types were placed in this encounter.   All questions were answered. The patient knows to call the clinic with any problems, questions or concerns. No barriers to learning was detected. I spent 25 minutes counseling the patient face to face. The total time spent in the appointment was 30 minutes and more than 50% was on counseling and review of test results     [facility], [place], MD 09/13/2014 3:17 PM

## 2014-09-18 ENCOUNTER — Encounter: Payer: Self-pay | Admitting: Internal Medicine

## 2014-09-18 ENCOUNTER — Ambulatory Visit: Payer: Medicaid Other | Attending: Internal Medicine | Admitting: Internal Medicine

## 2014-09-18 VITALS — BP 143/84 | HR 83 | Temp 98.0°F | Resp 16 | Wt 233.0 lb

## 2014-09-18 DIAGNOSIS — R05 Cough: Secondary | ICD-10-CM | POA: Insufficient documentation

## 2014-09-18 DIAGNOSIS — F17210 Nicotine dependence, cigarettes, uncomplicated: Secondary | ICD-10-CM | POA: Diagnosis not present

## 2014-09-18 DIAGNOSIS — C7931 Secondary malignant neoplasm of brain: Secondary | ICD-10-CM | POA: Insufficient documentation

## 2014-09-18 DIAGNOSIS — J439 Emphysema, unspecified: Secondary | ICD-10-CM | POA: Diagnosis not present

## 2014-09-18 DIAGNOSIS — R350 Frequency of micturition: Secondary | ICD-10-CM | POA: Diagnosis not present

## 2014-09-18 DIAGNOSIS — R338 Other retention of urine: Secondary | ICD-10-CM | POA: Diagnosis not present

## 2014-09-18 DIAGNOSIS — C3490 Malignant neoplasm of unspecified part of unspecified bronchus or lung: Secondary | ICD-10-CM | POA: Insufficient documentation

## 2014-09-18 DIAGNOSIS — M549 Dorsalgia, unspecified: Secondary | ICD-10-CM | POA: Diagnosis not present

## 2014-09-18 DIAGNOSIS — F329 Major depressive disorder, single episode, unspecified: Secondary | ICD-10-CM | POA: Insufficient documentation

## 2014-09-18 DIAGNOSIS — D649 Anemia, unspecified: Secondary | ICD-10-CM | POA: Insufficient documentation

## 2014-09-18 DIAGNOSIS — Z9981 Dependence on supplemental oxygen: Secondary | ICD-10-CM | POA: Diagnosis not present

## 2014-09-18 DIAGNOSIS — N401 Enlarged prostate with lower urinary tract symptoms: Secondary | ICD-10-CM | POA: Diagnosis not present

## 2014-09-18 DIAGNOSIS — Z79899 Other long term (current) drug therapy: Secondary | ICD-10-CM | POA: Insufficient documentation

## 2014-09-18 DIAGNOSIS — R222 Localized swelling, mass and lump, trunk: Secondary | ICD-10-CM | POA: Insufficient documentation

## 2014-09-18 DIAGNOSIS — Z8249 Family history of ischemic heart disease and other diseases of the circulatory system: Secondary | ICD-10-CM | POA: Diagnosis not present

## 2014-09-18 DIAGNOSIS — F419 Anxiety disorder, unspecified: Secondary | ICD-10-CM | POA: Insufficient documentation

## 2014-09-18 DIAGNOSIS — G8929 Other chronic pain: Secondary | ICD-10-CM | POA: Diagnosis not present

## 2014-09-18 DIAGNOSIS — I10 Essential (primary) hypertension: Secondary | ICD-10-CM | POA: Diagnosis not present

## 2014-09-18 DIAGNOSIS — E1140 Type 2 diabetes mellitus with diabetic neuropathy, unspecified: Secondary | ICD-10-CM

## 2014-09-18 DIAGNOSIS — F17200 Nicotine dependence, unspecified, uncomplicated: Secondary | ICD-10-CM

## 2014-09-18 DIAGNOSIS — K209 Esophagitis, unspecified: Secondary | ICD-10-CM | POA: Diagnosis not present

## 2014-09-18 DIAGNOSIS — E089 Diabetes mellitus due to underlying condition without complications: Secondary | ICD-10-CM

## 2014-09-18 DIAGNOSIS — Z720 Tobacco use: Secondary | ICD-10-CM

## 2014-09-18 DIAGNOSIS — Z9889 Other specified postprocedural states: Secondary | ICD-10-CM | POA: Diagnosis not present

## 2014-09-18 DIAGNOSIS — E119 Type 2 diabetes mellitus without complications: Secondary | ICD-10-CM | POA: Diagnosis present

## 2014-09-18 LAB — GLUCOSE, POCT (MANUAL RESULT ENTRY): POC Glucose: 120 mg/dl — AB (ref 70–99)

## 2014-09-18 LAB — POCT GLYCOSYLATED HEMOGLOBIN (HGB A1C): HEMOGLOBIN A1C: 7.6

## 2014-09-18 MED ORDER — GABAPENTIN 400 MG PO CAPS: 800.0000 mg | ORAL_CAPSULE | Freq: Three times a day (TID) | ORAL | Status: AC

## 2014-09-18 MED ORDER — GLIMEPIRIDE 4 MG PO TABS: 4.0000 mg | ORAL_TABLET | Freq: Two times a day (BID) | ORAL | Status: AC

## 2014-09-18 NOTE — Progress Notes (Signed)
MRN: [id number] Name: Rodney Powers  Sex: male Age: 58 y.o. DOB: Nov 18, 1956  Allergies: Review of patient's allergies indicates no known allergies.  Chief Complaint  Patient presents with  . Follow-up    HPI: Patient is 58 y.o. male who has to of diabetes hypertension diabetes neuropathy, history of lung cancer following up with oncologist and undergoing chemotherapy, comes today for followup he denies any hypoglycemic symptoms currently taking Amaryl 4 mg, his A1c has trended up, patient used to take metformin in the past which she could not tolerate her, his blood pressure is borderline elevated but denies any headache dizziness chest and shortness of breath.  Past Medical History  Diagnosis Date  . Back pain, chronic   . Mediastinal mass   . Urinary retention   . Diabetic neuropathy   . Diabetes mellitus, type II     takes Gimepimide and Metformin daily  . Enlarged prostate     takes Flomax every evening  . Hypertension     slightly elevated recently;no meds;just watching for now  . COPD (chronic obstructive pulmonary disease)     emphysema  . Shortness of breath     with exertion  . Pneumonia     hx of;last time about 19yrs ago  . History of bronchitis     last time 31yrs ago  . Joint pain     stiffness and cramping in both hands  . Urinary frequency   . Nocturia   . Anemia     no meds and hasn't had a transfusion ever  . Anxiety   . Depression     no meds required;hospitatlized 52yrs ago for this  . Hx of radiation therapy 04/19/13- 06/07/13    left upper central chest, 63 gray 35 fx  . Heart murmur     as a baby  . Diarrhea   . Neuropathy   . Back pain 11/14/2013  . On home O2     2L N/C  . Lung cancer 04/07/2013  . Esophagitis 08/23/2014  . Cough 08/23/2014    Past Surgical History  Procedure Laterality Date  . Video bronchoscopy with endobronchial ultrasound Bilateral 03/10/2013    Procedure: VIDEO BRONCHOSCOPY WITH ENDOBRONCHIAL ULTRASOUND;  Surgeon:  Rigoberto Noel, MD;  Location: [facility];  Service: Pulmonary;  Laterality: Bilateral;  . Mediastinotomy chamberlain mcneil Left 03/24/2013    Procedure: MEDIASTINOTOMY CHAMBERLAIN MCNEIL;  Surgeon: Melrose Nakayama, MD;  Location: [facility];  Service: Thoracic;  Laterality: Left;  . Excisional hemorrhoidectomy      > 30 yrs ago  . Colonoscopy    . Direct laryngoscopy N/A 07/01/2013    Procedure: DIRECT LARYNGOSCOPY;  Surgeon: Izora Gala, MD;  Location: [facility];  Service: ENT;  Laterality: N/A;  . Esophagoscopy N/A 07/01/2013    Procedure: ESOPHAGOSCOPY WITH BIOPSY;  Surgeon: Izora Gala, MD;  Location: [facility] OR;  Service: ENT;  Laterality: N/A;  . Direct laryngoscopy N/A 08/01/2013    Procedure: DIRECT LARYNGOSCOPY WITH EXCISION OF PHARYNGEAL MASS;  Surgeon: Izora Gala, MD;  Location: [facility];  Service: ENT;  Laterality: N/A;  . Tonsillectomy Right 08/01/2013    Procedure: TONSILLECTOMY;  Surgeon: Izora Gala, MD;  Location: [facility] OR;  Service: ENT;  Laterality: Right;  . Craniotomy N/A 07/19/2014    Procedure: CRANIOTOMY TUMOR EXCISION with Stealth;  Surgeon: Consuella Lose, MD;  Location: [facility];  Service: Neurosurgery;  Laterality: N/A;  CRANIOTOMY FOR EXCISION OF TUMOR      Medication  List       This list is accurate as of: 09/18/14 12:30 PM.  Always use your most recent med list.               ACCU-CHEK FASTCLIX LANCETS Misc  1 each by Does not apply route daily.     benzonatate 100 MG capsule  Commonly known as:  TESSALON  Take 1 capsule (100 mg total) by mouth 3 (three) times daily as needed for cough.     gabapentin 400 MG capsule  Commonly known as:  NEURONTIN  Take 2 capsules (800 mg total) by mouth 3 (three) times daily.     glimepiride 4 MG tablet  Commonly known as:  AMARYL  Take 1 tablet (4 mg total) by mouth 2 (two) times daily.     lidocaine-prilocaine cream  Commonly known as:  EMLA  Apply 1 application topically as needed (for port access).     ondansetron  8 MG tablet  Commonly known as:  ZOFRAN  Take 1 tablet (8 mg total) by mouth every 8 (eight) hours as needed for nausea.     OXYCONTIN 15 mg T12a 12 hr tablet  Generic drug:  OxyCODONE  Take 15 mg by mouth every 12 (twelve) hours.     prochlorperazine 10 MG tablet  Commonly known as:  COMPAZINE  Take 1 tablet (10 mg total) by mouth every 6 (six) hours as needed (Nausea or vomiting).     ramipril 2.5 MG capsule  Commonly known as:  ALTACE  Take 2.5 mg by mouth every morning.     simvastatin 20 MG tablet  Commonly known as:  ZOCOR  Take 20 mg by mouth at bedtime.     sucralfate 1 GM/10ML suspension  Commonly known as:  CARAFATE  Take 10 mLs (1 g total) by mouth 4 (four) times daily -  with meals and at bedtime.        Meds ordered this encounter  Medications  . glimepiride (AMARYL) 4 MG tablet    Sig: Take 1 tablet (4 mg total) by mouth 2 (two) times daily.    Dispense:  60 tablet    Refill:  3  . gabapentin (NEURONTIN) 400 MG capsule    Sig: Take 2 capsules (800 mg total) by mouth 3 (three) times daily.    Dispense:  180 capsule    Refill:  2    Immunization History  Administered Date(s) Administered  . Influenza,inj,Quad PF,36+ Mos 08/23/2014    Family History  Problem Relation Age of Onset  . Aneurysm Mother   . Heart attack Father   . Aneurysm Sister   . Cancer Brother     History  Substance Use Topics  . Smoking status: Current Every Day Smoker -- 0.50 packs/day for 41 years    Types: Cigarettes    Start date: 07/18/1971  . Smokeless tobacco: Never Used     Comment: nicotene patch  . Alcohol Use: 8.4 oz/week    14 Cans of beer per week     Comment: daily beer 2    Review of Systems   As noted in HPI  Filed Vitals:   09/18/14 1146  BP: 143/84  Pulse: 83  Temp: 98 F (36.7 C)  Resp: 16    Physical Exam  Physical Exam  Constitutional: No distress.  Eyes: EOM are normal. Pupils are equal, round, and reactive to light.  Cardiovascular:  Normal rate and regular rhythm.   Pulmonary/Chest: Breath sounds normal.  No respiratory distress. He has no wheezes. He has no rales.  Musculoskeletal: He exhibits no edema.    CBC    Component Value Date/Time   WBC 5.0 09/13/2014 1122   WBC 7.3 08/25/2014 1311   RBC 5.17 09/13/2014 1122   RBC 5.12 08/25/2014 1311   HGB 13.8 09/13/2014 1122   HGB 14.1 08/25/2014 1311   HCT 43.2 09/13/2014 1122   HCT 43.0 08/25/2014 1311   PLT 234 09/13/2014 1122   PLT 217 08/25/2014 1311   MCV 83.6 09/13/2014 1122   MCV 84.0 08/25/2014 1311   LYMPHSABS 1.1 09/13/2014 1122   LYMPHSABS 0.7 08/25/2014 1311   MONOABS 0.5 09/13/2014 1122   MONOABS 0.3 08/25/2014 1311   EOSABS 0.1 09/13/2014 1122   EOSABS 0.0 08/25/2014 1311   BASOSABS 0.1 09/13/2014 1122   BASOSABS 0.0 08/25/2014 1311    CMP     Component Value Date/Time   NA 143 09/13/2014 1123   NA 143 08/25/2014 1311   K 4.2 09/13/2014 1123   K 3.8 08/25/2014 1311   CL 106 08/25/2014 1311   CL 104 06/06/2013 0841   CO2 24 09/13/2014 1123   CO2 25 08/25/2014 1311   GLUCOSE 74 09/13/2014 1123   GLUCOSE 59* 08/25/2014 1311   GLUCOSE 258* 06/06/2013 0841   BUN 7.9 09/13/2014 1123   BUN 17 08/25/2014 1311   CREATININE 1.1 09/13/2014 1123   CREATININE 1.17 08/25/2014 1311   CREATININE 0.94 12/29/2013 0932   CALCIUM 9.8 09/13/2014 1123   CALCIUM 9.5 08/25/2014 1311   PROT 7.3 09/13/2014 1123   PROT 7.3 07/14/2014 0414   ALBUMIN 3.5 09/13/2014 1123   ALBUMIN 3.5 07/14/2014 0414   AST 17 09/13/2014 1123   AST 15 07/14/2014 0414   ALT 14 09/13/2014 1123   ALT 15 07/14/2014 0414   ALKPHOS 85 09/13/2014 1123   ALKPHOS 84 07/14/2014 0414   BILITOT 0.33 09/13/2014 1123   BILITOT 0.3 07/14/2014 0414   GFRNONAA 68* 08/25/2014 1311   GFRNONAA >89 12/29/2013 0932   GFRAA 78* 08/25/2014 1311   GFRAA >89 12/29/2013 0932    Lab Results  Component Value Date/Time   CHOL 233* 12/29/2013  9:32 AM    No components found with this basename: hga1c    Lab Results  Component Value Date/Time    AST 17 09/13/2014 11:23 AM   AST 15 07/14/2014  4:14 AM    Assessment and Plan  Diabetes mellitus due to underlying condition without complications - Plan:  Results for orders placed in visit on 09/18/14  GLUCOSE, POCT (MANUAL RESULT ENTRY)      Result Value Ref Range   POC Glucose 120 (*) 70 - 99 mg/dl  POCT GLYCOSYLATED HEMOGLOBIN (HGB A1C)      Result Value Ref Range   Hemoglobin A1C 7.6     HgB A1c has trended up compared to last visit, I have advised patient for diabetes meal planning, also increased Amaryl to take 2 times a day, glimepiride (AMARYL) 4 MG tablet, advised patient to keep the fingerstick log.  Essential hypertension, benign Blood pressure is borderline elevated, advise patient for DASH diet continue with ramipril  Smoker Patient is to smoke cigarettes, I have counseled patient to quit smoking.  Diabetic neuropathy, painful - Plan: Patient is given refill on gabapentin (NEURONTIN) 400 MG capsule     Return in about 3 months (around 12/19/2014) for diabetes, hypertension.  Lorayne Marek, MD

## 2014-09-18 NOTE — Progress Notes (Signed)
Patient here for follow up on his diabetes

## 2014-09-18 NOTE — Patient Instructions (Addendum)
DASH Eating Plan DASH stands for "Dietary Approaches to Stop Hypertension." The DASH eating plan is a healthy eating plan that has been shown to reduce high blood pressure (hypertension). Additional health benefits may include reducing the risk of type 2 diabetes mellitus, heart disease, and stroke. The DASH eating plan may also help with weight loss. WHAT DO I NEED TO KNOW ABOUT THE DASH EATING PLAN? For the DASH eating plan, you will follow these general guidelines:  Choose foods with a percent daily value for sodium of less than 5% (as listed on the food label).  Use salt-free seasonings or herbs instead of table salt or sea salt.  Check with your health care provider or pharmacist before using salt substitutes.  Eat lower-sodium products, often labeled as "lower sodium" or "no salt added."  Eat fresh foods.  Eat more vegetables, fruits, and low-fat dairy products.  Choose whole grains. Look for the word "whole" as the first word in the ingredient list.  Choose fish and skinless chicken or turkey more often than red meat. Limit fish, poultry, and meat to 6 oz (170 g) each day.  Limit sweets, desserts, sugars, and sugary drinks.  Choose heart-healthy fats.  Limit cheese to 1 oz (28 g) per day.  Eat more home-cooked food and less restaurant, buffet, and fast food.  Limit fried foods.  Cook foods using methods other than frying.  Limit canned vegetables. If you do use them, rinse them well to decrease the sodium.  When eating at a restaurant, ask that your food be prepared with less salt, or no salt if possible. WHAT FOODS CAN I EAT? Seek help from a dietitian for individual calorie needs. Grains Whole grain or whole wheat bread. Brown rice. Whole grain or whole wheat pasta. Quinoa, bulgur, and whole grain cereals. Low-sodium cereals. Corn or whole wheat flour tortillas. Whole grain cornbread. Whole grain crackers. Low-sodium crackers. Vegetables Fresh or frozen vegetables  (raw, steamed, roasted, or grilled). Low-sodium or reduced-sodium tomato and vegetable juices. Low-sodium or reduced-sodium tomato sauce and paste. Low-sodium or reduced-sodium canned vegetables.  Fruits All fresh, canned (in natural juice), or frozen fruits. Meat and Other Protein Products Ground beef (85% or leaner), grass-fed beef, or beef trimmed of fat. Skinless chicken or turkey. Ground chicken or turkey. Pork trimmed of fat. All fish and seafood. Eggs. Dried beans, peas, or lentils. Unsalted nuts and seeds. Unsalted canned beans. Dairy Low-fat dairy products, such as skim or 1% milk, 2% or reduced-fat cheeses, low-fat ricotta or cottage cheese, or plain low-fat yogurt. Low-sodium or reduced-sodium cheeses. Fats and Oils Tub margarines without trans fats. Light or reduced-fat mayonnaise and salad dressings (reduced sodium). Avocado. Safflower, olive, or canola oils. Natural peanut or almond butter. Other Unsalted popcorn and pretzels. The items listed above may not be a complete list of recommended foods or beverages. Contact your dietitian for more options. WHAT FOODS ARE NOT RECOMMENDED? Grains White bread. White pasta. White rice. Refined cornbread. Bagels and croissants. Crackers that contain trans fat. Vegetables Creamed or fried vegetables. Vegetables in a cheese sauce. Regular canned vegetables. Regular canned tomato sauce and paste. Regular tomato and vegetable juices. Fruits Dried fruits. Canned fruit in light or heavy syrup. Fruit juice. Meat and Other Protein Products Fatty cuts of meat. Ribs, chicken wings, bacon, sausage, bologna, salami, chitterlings, fatback, hot dogs, bratwurst, and packaged luncheon meats. Salted nuts and seeds. Canned beans with salt. Dairy Whole or 2% milk, cream, half-and-half, and cream cheese. Whole-fat or sweetened yogurt. Full-fat   cheeses or blue cheese. Nondairy creamers and whipped toppings. Processed cheese, cheese spreads, or cheese  curds. Condiments Onion and garlic salt, seasoned salt, table salt, and sea salt. Canned and packaged gravies. Worcestershire sauce. Tartar sauce. Barbecue sauce. Teriyaki sauce. Soy sauce, including reduced sodium. Steak sauce. Fish sauce. Oyster sauce. Cocktail sauce. Horseradish. Ketchup and mustard. Meat flavorings and tenderizers. Bouillon cubes. Hot sauce. Tabasco sauce. Marinades. Taco seasonings. Relishes. Fats and Oils Butter, stick margarine, lard, shortening, ghee, and bacon fat. Coconut, palm kernel, or palm oils. Regular salad dressings. Other Pickles and olives. Salted popcorn and pretzels. The items listed above may not be a complete list of foods and beverages to avoid. Contact your dietitian for more information. WHERE CAN I FIND MORE INFORMATION? National Heart, Lung, and Blood Institute: travelstabloid.com Document Released: 11/20/2011 Document Revised: 04/17/2014 Document Reviewed: 10/05/2013 [facility] Patient Information 2015 [place], [place]. This information is not intended to replace advice given to you by your health care provider. Make sure you discuss any questions you have with your health care provider. DASH Eating Plan DASH stands for "Dietary Approaches to Stop Hypertension." The DASH eating plan is a healthy eating plan that has been shown to reduce high blood pressure (hypertension). Additional health benefits may include reducing the risk of type 2 diabetes mellitus, heart disease, and stroke. The DASH eating plan may also help with weight loss. WHAT DO I NEED TO KNOW ABOUT THE DASH EATING PLAN? For the DASH eating plan, you will follow these general guidelines:  Choose foods with a percent daily value for sodium of less than 5% (as listed on the food label).  Use salt-free seasonings or herbs instead of table salt or sea salt.  Check with your health care provider or pharmacist before using salt substitutes.  Eat lower-sodium  products, often labeled as "lower sodium" or "no salt added."  Eat fresh foods.  Eat more vegetables, fruits, and low-fat dairy products.  Choose whole grains. Look for the word "whole" as the first word in the ingredient list.  Choose fish and skinless chicken or [place] more often than red meat. Limit fish, poultry, and meat to 6 oz (170 g) each day.  Limit sweets, desserts, sugars, and sugary drinks.  Choose heart-healthy fats.  Limit cheese to 1 oz (28 g) per day.  Eat more home-cooked food and less restaurant, buffet, and fast food.  Limit fried foods.  Cook foods using methods other than frying.  Limit canned vegetables. If you do use them, rinse them well to decrease the sodium.  When eating at a restaurant, ask that your food be prepared with less salt, or no salt if possible. WHAT FOODS CAN I EAT? Seek help from a dietitian for individual calorie needs. Grains Whole grain or whole wheat bread. Brown rice. Whole grain or whole wheat pasta. Quinoa, bulgur, and whole grain cereals. Low-sodium cereals. Corn or whole wheat flour tortillas. Whole grain cornbread. Whole grain crackers. Low-sodium crackers. Vegetables Fresh or frozen vegetables (raw, steamed, roasted, or grilled). Low-sodium or reduced-sodium tomato and vegetable juices. Low-sodium or reduced-sodium tomato sauce and paste. Low-sodium or reduced-sodium canned vegetables.  Fruits All fresh, canned (in natural juice), or frozen fruits. Meat and Other Protein Products Ground beef (85% or leaner), grass-fed beef, or beef trimmed of fat. Skinless chicken or [place]. Ground chicken or [place]. Pork trimmed of fat. All fish and seafood. Eggs. Dried beans, peas, or lentils. Unsalted nuts and seeds. Unsalted canned beans. Dairy Low-fat dairy products, such as skim  or 1% milk, 2% or reduced-fat cheeses, low-fat ricotta or cottage cheese, or plain low-fat yogurt. Low-sodium or reduced-sodium cheeses. Fats and Oils Tub  margarines without trans fats. Light or reduced-fat mayonnaise and salad dressings (reduced sodium). Avocado. Safflower, olive, or canola oils. Natural peanut or almond butter. Other Unsalted popcorn and pretzels. The items listed above may not be a complete list of recommended foods or beverages. Contact your dietitian for more options. WHAT FOODS ARE NOT RECOMMENDED? Grains White bread. White pasta. White rice. Refined cornbread. Bagels and croissants. Crackers that contain trans fat. Vegetables Creamed or fried vegetables. Vegetables in a cheese sauce. Regular canned vegetables. Regular canned tomato sauce and paste. Regular tomato and vegetable juices. Fruits Dried fruits. Canned fruit in light or heavy syrup. Fruit juice. Meat and Other Protein Products Fatty cuts of meat. Ribs, chicken wings, bacon, sausage, bologna, salami, chitterlings, fatback, hot dogs, bratwurst, and packaged luncheon meats. Salted nuts and seeds. Canned beans with salt. Dairy Whole or 2% milk, cream, half-and-half, and cream cheese. Whole-fat or sweetened yogurt. Full-fat cheeses or blue cheese. Nondairy creamers and whipped toppings. Processed cheese, cheese spreads, or cheese curds. Condiments Onion and garlic salt, seasoned salt, table salt, and sea salt. Canned and packaged gravies. Worcestershire sauce. Tartar sauce. Barbecue sauce. Teriyaki sauce. Soy sauce, including reduced sodium. Steak sauce. Fish sauce. Oyster sauce. Cocktail sauce. Horseradish. Ketchup and mustard. Meat flavorings and tenderizers. Bouillon cubes. Hot sauce. Tabasco sauce. Marinades. Taco seasonings. Relishes. Fats and Oils Butter, stick margarine, lard, shortening, ghee, and bacon fat. Coconut, palm kernel, or palm oils. Regular salad dressings. Other Pickles and olives. Salted popcorn and pretzels. The items listed above may not be a complete list of foods and beverages to avoid. Contact your dietitian for more information. WHERE CAN I  FIND MORE INFORMATION? National Heart, Lung, and Blood Institute: travelstabloid.com Document Released: 11/20/2011 Document Revised: 04/17/2014 Document Reviewed: 10/05/2013 [facility] Patient Information 2015 [place], [place]. This information is not intended to replace advice given to you by your health care provider. Make sure you discuss any questions you have with your health care provider.

## 2014-09-19 ENCOUNTER — Ambulatory Visit: Payer: Medicaid Other

## 2014-09-20 ENCOUNTER — Ambulatory Visit (HOSPITAL_BASED_OUTPATIENT_CLINIC_OR_DEPARTMENT_OTHER): Payer: Medicaid Other

## 2014-09-20 ENCOUNTER — Ambulatory Visit: Payer: Medicaid Other

## 2014-09-20 ENCOUNTER — Other Ambulatory Visit (HOSPITAL_BASED_OUTPATIENT_CLINIC_OR_DEPARTMENT_OTHER): Payer: Medicaid Other

## 2014-09-20 VITALS — BP 120/73 | HR 84 | Temp 98.0°F | Resp 16

## 2014-09-20 DIAGNOSIS — C3410 Malignant neoplasm of upper lobe, unspecified bronchus or lung: Secondary | ICD-10-CM

## 2014-09-20 DIAGNOSIS — C7931 Secondary malignant neoplasm of brain: Secondary | ICD-10-CM

## 2014-09-20 DIAGNOSIS — C3400 Malignant neoplasm of unspecified main bronchus: Secondary | ICD-10-CM

## 2014-09-20 DIAGNOSIS — Z95828 Presence of other vascular implants and grafts: Secondary | ICD-10-CM

## 2014-09-20 DIAGNOSIS — Z5111 Encounter for antineoplastic chemotherapy: Secondary | ICD-10-CM

## 2014-09-20 LAB — COMPREHENSIVE METABOLIC PANEL (CC13)
ALT: 24 U/L (ref 0–55)
AST: 18 U/L (ref 5–34)
Albumin: 3.5 g/dL (ref 3.5–5.0)
Alkaline Phosphatase: 77 U/L (ref 40–150)
Anion Gap: 8 mEq/L (ref 3–11)
BUN: 10.8 mg/dL (ref 7.0–26.0)
CHLORIDE: 109 meq/L (ref 98–109)
CO2: 26 mEq/L (ref 22–29)
CREATININE: 1.1 mg/dL (ref 0.7–1.3)
Calcium: 9.8 mg/dL (ref 8.4–10.4)
Glucose: 129 mg/dl (ref 70–140)
Potassium: 4.2 mEq/L (ref 3.5–5.1)
Sodium: 143 mEq/L (ref 136–145)
Total Bilirubin: 0.47 mg/dL (ref 0.20–1.20)
Total Protein: 7 g/dL (ref 6.4–8.3)

## 2014-09-20 LAB — CBC WITH DIFFERENTIAL/PLATELET
BASO%: 1.1 % (ref 0.0–2.0)
Basophils Absolute: 0 10*3/uL (ref 0.0–0.1)
EOS%: 2.2 % (ref 0.0–7.0)
Eosinophils Absolute: 0.1 10*3/uL (ref 0.0–0.5)
HCT: 42.8 % (ref 38.4–49.9)
HGB: 13.8 g/dL (ref 13.0–17.1)
LYMPH#: 0.9 10*3/uL (ref 0.9–3.3)
LYMPH%: 23.5 % (ref 14.0–49.0)
MCH: 26.7 pg — ABNORMAL LOW (ref 27.2–33.4)
MCHC: 32.3 g/dL (ref 32.0–36.0)
MCV: 82.7 fL (ref 79.3–98.0)
MONO#: 0.3 10*3/uL (ref 0.1–0.9)
MONO%: 6.7 % (ref 0.0–14.0)
NEUT#: 2.7 10*3/uL (ref 1.5–6.5)
NEUT%: 66.5 % (ref 39.0–75.0)
Platelets: 206 10*3/uL (ref 140–400)
RBC: 5.17 10*6/uL (ref 4.20–5.82)
RDW: 16.1 % — ABNORMAL HIGH (ref 11.0–14.6)
WBC: 4 10*3/uL (ref 4.0–10.3)

## 2014-09-20 MED ORDER — SODIUM CHLORIDE 0.9 % IJ SOLN
10.0000 mL | INTRAMUSCULAR | Status: DC | PRN
  Administered 2014-09-20: 10 mL via INTRAVENOUS
  Filled 2014-09-20: qty 10

## 2014-09-20 MED ORDER — HEPARIN SOD (PORK) LOCK FLUSH 100 UNIT/ML IV SOLN
500.0000 [IU] | Freq: Once | INTRAVENOUS | Status: AC | PRN
  Administered 2014-09-20: 500 [IU]
  Filled 2014-09-20: qty 5

## 2014-09-20 MED ORDER — SODIUM CHLORIDE 0.9 % IJ SOLN
10.0000 mL | INTRAMUSCULAR | Status: DC | PRN
Start: 2014-09-20 — End: 2014-09-20
  Administered 2014-09-20: 10 mL
  Filled 2014-09-20: qty 10

## 2014-09-20 MED ORDER — ONDANSETRON 16 MG/50ML IVPB (CHCC)
INTRAVENOUS | Status: AC
  Filled 2014-09-20: qty 16

## 2014-09-20 MED ORDER — DEXAMETHASONE SODIUM PHOSPHATE 20 MG/5ML IJ SOLN
INTRAMUSCULAR | Status: AC
  Filled 2014-09-20: qty 5

## 2014-09-20 MED ORDER — DEXAMETHASONE SODIUM PHOSPHATE 20 MG/5ML IJ SOLN
20.0000 mg | Freq: Once | INTRAMUSCULAR | Status: AC
  Administered 2014-09-20: 20 mg via INTRAVENOUS

## 2014-09-20 MED ORDER — SODIUM CHLORIDE 0.9 % IV SOLN
Freq: Once | INTRAVENOUS | Status: AC
  Administered 2014-09-20: 13:00:00 via INTRAVENOUS

## 2014-09-20 MED ORDER — ONDANSETRON 16 MG/50ML IVPB (CHCC)
16.0000 mg | Freq: Once | INTRAVENOUS | Status: AC
  Administered 2014-09-20: 16 mg via INTRAVENOUS

## 2014-09-20 MED ORDER — PACLITAXEL PROTEIN-BOUND CHEMO INJECTION 100 MG
100.0000 mg/m2 | Freq: Once | INTRAVENOUS | Status: AC
  Administered 2014-09-20: 225 mg via INTRAVENOUS
  Filled 2014-09-20: qty 45

## 2014-09-20 MED ORDER — SODIUM CHLORIDE 0.9 % IV SOLN
249.2000 mg | Freq: Once | INTRAVENOUS | Status: AC
  Administered 2014-09-20: 250 mg via INTRAVENOUS
  Filled 2014-09-20: qty 25

## 2014-09-20 NOTE — Patient Instructions (Signed)

## 2014-09-20 NOTE — Patient Instructions (Addendum)
[facility] Discharge Instructions for Patients Receiving Chemotherapy  Today you received the following chemotherapy agents : Abraxane and Carboplatin   To help prevent nausea and vomiting after your treatment, we encourage you to take your nausea medications as directed:  Compazine 10 mg every 6 hours as needed  Zofran 8 mg every 8 hours as needed If you develop nausea and vomiting that is not controlled by your nausea medication, call the clinic.   BELOW ARE SYMPTOMS THAT SHOULD BE REPORTED IMMEDIATELY:  *FEVER GREATER THAN 100.5 F  *CHILLS WITH OR WITHOUT FEVER  NAUSEA AND VOMITING THAT IS NOT CONTROLLED WITH YOUR NAUSEA MEDICATION  *UNUSUAL SHORTNESS OF BREATH  *UNUSUAL BRUISING OR BLEEDING  TENDERNESS IN MOUTH AND THROAT WITH OR WITHOUT PRESENCE OF ULCERS  *URINARY PROBLEMS  *BOWEL PROBLEMS--Take Imodium #2 tablets four times daily if needed for loose stool. Call MD if not resolved in 24 hours.  UNUSUAL RASH Items with * indicate a potential emergency and should be followed up as soon as possible.  Feel free to call the clinic should you have any questions or concerns. The clinic phone number is [phone].  It has been a pleasure to serve you today!

## 2014-10-04 ENCOUNTER — Telehealth: Payer: Self-pay | Admitting: Hematology and Oncology

## 2014-10-04 ENCOUNTER — Ambulatory Visit: Payer: Medicaid Other

## 2014-10-04 ENCOUNTER — Encounter: Payer: Self-pay | Admitting: Hematology and Oncology

## 2014-10-04 ENCOUNTER — Ambulatory Visit (HOSPITAL_BASED_OUTPATIENT_CLINIC_OR_DEPARTMENT_OTHER): Payer: Medicaid Other

## 2014-10-04 ENCOUNTER — Other Ambulatory Visit (HOSPITAL_BASED_OUTPATIENT_CLINIC_OR_DEPARTMENT_OTHER): Payer: Medicaid Other

## 2014-10-04 ENCOUNTER — Ambulatory Visit (HOSPITAL_BASED_OUTPATIENT_CLINIC_OR_DEPARTMENT_OTHER): Payer: Medicaid Other | Admitting: Hematology and Oncology

## 2014-10-04 VITALS — BP 151/84 | HR 60 | Temp 98.8°F | Resp 20 | Ht 75.0 in | Wt 235.9 lb

## 2014-10-04 DIAGNOSIS — M545 Low back pain, unspecified: Secondary | ICD-10-CM

## 2014-10-04 DIAGNOSIS — D701 Agranulocytosis secondary to cancer chemotherapy: Secondary | ICD-10-CM | POA: Insufficient documentation

## 2014-10-04 DIAGNOSIS — Z95828 Presence of other vascular implants and grafts: Secondary | ICD-10-CM

## 2014-10-04 DIAGNOSIS — T50905A Adverse effect of unspecified drugs, medicaments and biological substances, initial encounter: Secondary | ICD-10-CM

## 2014-10-04 DIAGNOSIS — M546 Pain in thoracic spine: Secondary | ICD-10-CM

## 2014-10-04 DIAGNOSIS — C3410 Malignant neoplasm of upper lobe, unspecified bronchus or lung: Secondary | ICD-10-CM

## 2014-10-04 DIAGNOSIS — Z5111 Encounter for antineoplastic chemotherapy: Secondary | ICD-10-CM

## 2014-10-04 DIAGNOSIS — E1140 Type 2 diabetes mellitus with diabetic neuropathy, unspecified: Secondary | ICD-10-CM

## 2014-10-04 DIAGNOSIS — Z720 Tobacco use: Secondary | ICD-10-CM

## 2014-10-04 DIAGNOSIS — C7931 Secondary malignant neoplasm of brain: Secondary | ICD-10-CM

## 2014-10-04 DIAGNOSIS — C3430 Malignant neoplasm of lower lobe, unspecified bronchus or lung: Secondary | ICD-10-CM

## 2014-10-04 DIAGNOSIS — D6959 Other secondary thrombocytopenia: Secondary | ICD-10-CM | POA: Insufficient documentation

## 2014-10-04 DIAGNOSIS — C3400 Malignant neoplasm of unspecified main bronchus: Secondary | ICD-10-CM

## 2014-10-04 DIAGNOSIS — D630 Anemia in neoplastic disease: Secondary | ICD-10-CM | POA: Insufficient documentation

## 2014-10-04 DIAGNOSIS — T451X5A Adverse effect of antineoplastic and immunosuppressive drugs, initial encounter: Secondary | ICD-10-CM

## 2014-10-04 LAB — CBC WITH DIFFERENTIAL/PLATELET
BASO%: 0.6 % (ref 0.0–2.0)
BASOS ABS: 0 10*3/uL (ref 0.0–0.1)
EOS ABS: 0 10*3/uL (ref 0.0–0.5)
EOS%: 1.2 % (ref 0.0–7.0)
HEMATOCRIT: 39.8 % (ref 38.4–49.9)
HGB: 12.8 g/dL — ABNORMAL LOW (ref 13.0–17.1)
LYMPH%: 23.8 % (ref 14.0–49.0)
MCH: 27.1 pg — ABNORMAL LOW (ref 27.2–33.4)
MCHC: 32.2 g/dL (ref 32.0–36.0)
MCV: 84.1 fL (ref 79.3–98.0)
MONO#: 0.5 10*3/uL (ref 0.1–0.9)
MONO%: 13.3 % (ref 0.0–14.0)
NEUT%: 61.1 % (ref 39.0–75.0)
NEUTROS ABS: 2.1 10*3/uL (ref 1.5–6.5)
PLATELETS: 135 10*3/uL — AB (ref 140–400)
RBC: 4.73 10*6/uL (ref 4.20–5.82)
RDW: 17.4 % — ABNORMAL HIGH (ref 11.0–14.6)
WBC: 3.5 10*3/uL — AB (ref 4.0–10.3)
lymph#: 0.8 10*3/uL — ABNORMAL LOW (ref 0.9–3.3)
nRBC: 0 % (ref 0–0)

## 2014-10-04 LAB — COMPREHENSIVE METABOLIC PANEL
ALT: 17 U/L (ref 0–53)
AST: 16 U/L (ref 0–37)
Albumin: 3.4 g/dL — ABNORMAL LOW (ref 3.5–5.2)
Alkaline Phosphatase: 68 U/L (ref 39–117)
BUN: 12 mg/dL (ref 6–23)
CHLORIDE: 105 meq/L (ref 96–112)
CO2: 26 mEq/L (ref 19–32)
CREATININE: 1.15 mg/dL (ref 0.50–1.35)
Calcium: 9.3 mg/dL (ref 8.4–10.5)
GLUCOSE: 128 mg/dL — AB (ref 70–99)
Potassium: 4.1 mEq/L (ref 3.5–5.3)
Sodium: 142 mEq/L (ref 135–145)
Total Bilirubin: 0.3 mg/dL (ref 0.3–1.2)
Total Protein: 7 g/dL (ref 6.0–8.3)

## 2014-10-04 MED ORDER — SODIUM CHLORIDE 0.9 % IV SOLN
Freq: Once | INTRAVENOUS | Status: AC
  Administered 2014-10-04: 11:00:00 via INTRAVENOUS

## 2014-10-04 MED ORDER — SODIUM CHLORIDE 0.9 % IV SOLN
249.2000 mg | Freq: Once | INTRAVENOUS | Status: AC
  Administered 2014-10-04: 250 mg via INTRAVENOUS
  Filled 2014-10-04: qty 25

## 2014-10-04 MED ORDER — SODIUM CHLORIDE 0.9 % IJ SOLN
10.0000 mL | INTRAMUSCULAR | Status: DC | PRN
  Administered 2014-10-04: 10 mL
  Filled 2014-10-04: qty 10

## 2014-10-04 MED ORDER — PACLITAXEL PROTEIN-BOUND CHEMO INJECTION 100 MG
100.0000 mg/m2 | Freq: Once | INTRAVENOUS | Status: AC
  Administered 2014-10-04: 225 mg via INTRAVENOUS
  Filled 2014-10-04: qty 45

## 2014-10-04 MED ORDER — DEXAMETHASONE SODIUM PHOSPHATE 20 MG/5ML IJ SOLN
20.0000 mg | Freq: Once | INTRAMUSCULAR | Status: AC
  Administered 2014-10-04: 20 mg via INTRAVENOUS

## 2014-10-04 MED ORDER — SODIUM CHLORIDE 0.9 % IJ SOLN
10.0000 mL | INTRAMUSCULAR | Status: DC | PRN
  Administered 2014-10-04: 10 mL via INTRAVENOUS
  Filled 2014-10-04: qty 10

## 2014-10-04 MED ORDER — MORPHINE SULFATE 15 MG PO TABS: 15.0000 mg | ORAL_TABLET | ORAL | Status: AC | PRN

## 2014-10-04 MED ORDER — ONDANSETRON 16 MG/50ML IVPB (CHCC)
16.0000 mg | Freq: Once | INTRAVENOUS | Status: AC
  Administered 2014-10-04: 16 mg via INTRAVENOUS

## 2014-10-04 MED ORDER — DEXAMETHASONE SODIUM PHOSPHATE 20 MG/5ML IJ SOLN
INTRAMUSCULAR | Status: AC
  Filled 2014-10-04: qty 5

## 2014-10-04 MED ORDER — HEPARIN SOD (PORK) LOCK FLUSH 100 UNIT/ML IV SOLN
500.0000 [IU] | Freq: Once | INTRAVENOUS | Status: AC | PRN
  Administered 2014-10-04: 500 [IU]
  Filled 2014-10-04: qty 5

## 2014-10-04 MED ORDER — ONDANSETRON 16 MG/50ML IVPB (CHCC)
INTRAVENOUS | Status: AC
  Filled 2014-10-04: qty 16

## 2014-10-04 NOTE — Progress Notes (Signed)
[facility] OFFICE PROGRESS NOTE  Patient Care Team: Lorayne Marek, MD as PCP - General (Internal Medicine) Rigoberto Noel, MD as Attending Physician (Pulmonary Disease) Louellen Molder, MD as Attending Physician (Internal Medicine) Heath Lark, MD as Consulting Physician (Hematology and Oncology)  SUMMARY OF ONCOLOGIC HISTORY:   Metastatic adenocarcinoma to brain   03/15/2013 Surgery Biopsy of mediastinum mass show poorly differentiated carcinoma, suspicious for squamous cell carcinoma   03/15/2013 Imaging PET scan showed a large hypermetabolic mass centered over the prevascular lymph node region and extending into the left upper lobe. This is concerning for bronchogenic carcinoma   04/18/2013 - 05/27/2013 Radiation Therapy He received concurrent chemoradiation treatment   04/18/2013 - 05/27/2013 Chemotherapy He received weekly carboplatin and Taxol with radiation treatment   07/01/2013 Surgery He had EGD, biopsy, laryngoscopy and tonsillectomy which showed no evidence of cancer   08/05/2013 Imaging Repeat CT scan showed marked interval response to therapy    02/22/2014 Imaging CT scan showed minimal progression of radiation changes in the left lung with increase in trace left-sided pleural fluid. Slight decrease can soft tissue fullness    06/22/2014 Imaging CT scan of the abdomen showed no evidence of cancer   07/13/2014 - 07/15/2014 Hospital Admission He was admitted to the hospital of the syncopal episode. Imaging studies showed metastatic cancer to the brain. Radiation oncologist and neurosurgeon work consult for local therapy.   07/13/2014 Imaging MRI brain showed two new enhancing right temporal lobe lesions, consistent with metastases. Extensive vasogenic edema results an 10 mm midline shift   07/17/2014 - 07/17/2014 Radiation Therapy He received one dose of stereotactic radiosurgery   08/23/2014 -  Chemotherapy The patient is on palliative chemotherapy with carboplatin and Abraxane, 2 weeks on  one-week off     INTERVAL HISTORY: Please see below for problem oriented charting. He is seen prior to chemotherapy today. He tolerated treatment well. He had mild nausea but no vomiting. He complained of lower back pain that is chronic in nature. Denies recent injury or fracture.  REVIEW OF SYSTEMS:   Constitutional: Denies fevers, chills or abnormal weight loss Eyes: Denies blurriness of vision Ears, nose, mouth, throat, and face: Denies mucositis or sore throat Respiratory: Denies cough, dyspnea or wheezes Cardiovascular: Denies palpitation, chest discomfort or lower extremity swelling Gastrointestinal:  Denies nausea, heartburn or change in bowel habits Skin: Denies abnormal skin rashes Lymphatics: Denies new lymphadenopathy or easy bruising Neurological:Denies numbness, tingling or new weaknesses Behavioral/Psych: Mood is stable, no new changes  All other systems were reviewed with the patient and are negative.  I have reviewed the past medical history, past surgical history, social history and family history with the patient and they are unchanged from previous note.  ALLERGIES:  has No Known Allergies.  MEDICATIONS:  Current Outpatient Prescriptions  Medication Sig Dispense Refill  . ACCU-CHEK FASTCLIX LANCETS MISC 1 each by Does not apply route daily.      . benzonatate (TESSALON) 100 MG capsule Take 1 capsule (100 mg total) by mouth 3 (three) times daily as needed for cough.  60 capsule  3  . gabapentin (NEURONTIN) 400 MG capsule Take 2 capsules (800 mg total) by mouth 3 (three) times daily.  180 capsule  2  . glimepiride (AMARYL) 4 MG tablet Take 1 tablet (4 mg total) by mouth 2 (two) times daily.  60 tablet  3  . lidocaine-prilocaine (EMLA) cream Apply 1 application topically as needed (for port access).      Marland Kitchen  ondansetron (ZOFRAN) 8 MG tablet Take 1 tablet (8 mg total) by mouth every 8 (eight) hours as needed for nausea.  30 tablet  1  . OxyCODONE (OXYCONTIN) 15 mg  T12A 12 hr tablet Take 15 mg by mouth every 12 (twelve) hours.      . prochlorperazine (COMPAZINE) 10 MG tablet Take 1 tablet (10 mg total) by mouth every 6 (six) hours as needed (Nausea or vomiting).  30 tablet  1  . ramipril (ALTACE) 2.5 MG capsule Take 2.5 mg by mouth every morning.      . simvastatin (ZOCOR) 20 MG tablet Take 20 mg by mouth at bedtime.       . sucralfate (CARAFATE) 1 GM/10ML suspension Take 10 mLs (1 g total) by mouth 4 (four) times daily -  with meals and at bedtime.  420 mL  3  . morphine (MSIR) 15 MG tablet Take 1 tablet (15 mg total) by mouth every 4 (four) hours as needed.  60 tablet  0   No current facility-administered medications for this visit.    PHYSICAL EXAMINATION: ECOG PERFORMANCE STATUS: 1 - Symptomatic but completely ambulatory  Filed Vitals:   10/04/14 1001  BP: 151/84  Pulse: 60  Temp: 98.8 F (37.1 C)  Resp: 20   Filed Weights   10/04/14 1001  Weight: 235 lb 14.4 oz (107.004 kg)    GENERAL:alert, no distress and comfortable SKIN: skin color, texture, turgor are normal, no rashes or significant lesions EYES: normal, Conjunctiva are pink and non-injected, sclera clear Musculoskeletal:no cyanosis of digits and no clubbing  NEURO: alert & oriented x 3 with fluent speech, no focal motor/sensory deficits  LABORATORY DATA:  I have reviewed the data as listed    Component Value Date/Time   NA 142 10/04/2014 0930   NA 143 09/20/2014 1142   K 4.1 10/04/2014 0930   K 4.2 09/20/2014 1142   CL 105 10/04/2014 0930   CL 104 06/06/2013 0841   CO2 26 10/04/2014 0930   CO2 26 09/20/2014 1142   GLUCOSE 128* 10/04/2014 0930   GLUCOSE 129 09/20/2014 1142   GLUCOSE 258* 06/06/2013 0841   BUN 12 10/04/2014 0930   BUN 10.8 09/20/2014 1142   CREATININE 1.15 10/04/2014 0930   CREATININE 1.1 09/20/2014 1142   CREATININE 0.94 12/29/2013 0932   CALCIUM 9.3 10/04/2014 0930   CALCIUM 9.8 09/20/2014 1142   PROT 7.0 10/04/2014 0930   PROT 7.0 09/20/2014 1142    ALBUMIN 3.4* 10/04/2014 0930   ALBUMIN 3.5 09/20/2014 1142   AST 16 10/04/2014 0930   AST 18 09/20/2014 1142   ALT 17 10/04/2014 0930   ALT 24 09/20/2014 1142   ALKPHOS 68 10/04/2014 0930   ALKPHOS 77 09/20/2014 1142   BILITOT 0.3 10/04/2014 0930   BILITOT 0.47 09/20/2014 1142   GFRNONAA 68* 08/25/2014 1311   GFRNONAA >89 12/29/2013 0932   GFRAA 78* 08/25/2014 1311   GFRAA >89 12/29/2013 0932    No results found for this basename: SPEP, UPEP,  kappa and lambda light chains    Lab Results  Component Value Date   WBC 3.5* 10/04/2014   NEUTROABS 2.1 10/04/2014   HGB 12.8* 10/04/2014   HCT 39.8 10/04/2014   MCV 84.1 10/04/2014   PLT 135* 10/04/2014      Chemistry      Component Value Date/Time   NA 142 10/04/2014 0930   NA 143 09/20/2014 1142   K 4.1 10/04/2014 0930   K 4.2 09/20/2014 1142  CL 105 10/04/2014 0930   CL 104 06/06/2013 0841   CO2 26 10/04/2014 0930   CO2 26 09/20/2014 1142   BUN 12 10/04/2014 0930   BUN 10.8 09/20/2014 1142   CREATININE 1.15 10/04/2014 0930   CREATININE 1.1 09/20/2014 1142   CREATININE 0.94 12/29/2013 0932      Component Value Date/Time   CALCIUM 9.3 10/04/2014 0930   CALCIUM 9.8 09/20/2014 1142   ALKPHOS 68 10/04/2014 0930   ALKPHOS 77 09/20/2014 1142   AST 16 10/04/2014 0930   AST 18 09/20/2014 1142   ALT 17 10/04/2014 0930   ALT 24 09/20/2014 1142   BILITOT 0.3 10/04/2014 0930   BILITOT 0.47 09/20/2014 1142      ASSESSMENT & PLAN:  Metastatic adenocarcinoma to brain He tolerated chemotherapy very well. I plan to order repeat staging scan after current cycle of treatment. We will continue current dose without adjustment.    Diabetic neuropathy, painful I will monitor neuropathy carefully while on chemotherapy. He is currently taking medication for this. This has not progressed since the start of treatment.    Tobacco abuse I spent some time counseling the patient the importance of tobacco cessation. he is currently attempting to quit  on his own    Back pain of thoracolumbar region I discussed narcotic refill policy. I gave him prescription of morphine sulfate as needed for back pain.  Anemia in neoplastic disease This is likely due to recent treatment. The patient denies recent history of bleeding such as epistaxis, hematuria or hematochezia. He is asymptomatic from the anemia. I will observe for now.  He does not require transfusion now. I will continue the chemotherapy at current dose without dosage adjustment.  If the anemia gets progressive worse in the future, I might have to delay his treatment or adjust the chemotherapy dose.   Leukopenia due to antineoplastic chemotherapy This is likely due to recent treatment. The patient denies recent history of fevers, cough, chills, diarrhea or dysuria. He is asymptomatic from the leukopenia. I will observe for now.  I will continue the chemotherapy at current dose without dosage adjustment.  If the leukopenia gets progressive worse in the future, I might have to delay his treatment or adjust the chemotherapy dose.    Thrombocytopenia due to drugs This is likely due to recent treatment. The patient denies recent history of bleeding such as epistaxis, hematuria or hematochezia. He is asymptomatic from the low platelet count. I will observe for now.  he does not require transfusion now. I will continue the chemotherapy at current dose without dosage adjustment.  If the thrombocytopenia gets progressive worse in the future, I might have to delay his treatment or adjust the chemotherapy dose.       Orders Placed This Encounter  Procedures  . CT Chest W Contrast    Standing Status: Future     Number of Occurrences:      Standing Expiration Date: 12/04/2015    Order Specific Question:  Reason for Exam (SYMPTOM  OR DIAGNOSIS REQUIRED)    Answer:  lung ca, assess response to to Rx    Order Specific Question:  Preferred imaging location?    Answer:  [facility]    All questions were answered. The patient knows to call the clinic with any problems, questions or concerns. No barriers to learning was detected. I spent 40 minutes counseling the patient face to face. The total time spent in the appointment was 55 minutes and more than  50% was on counseling and review of test results     [facility], Laiken Nohr, MD 10/04/2014 9:10 PM

## 2014-10-04 NOTE — Assessment & Plan Note (Signed)
This is likely due to recent treatment. The patient denies recent history of bleeding such as epistaxis, hematuria or hematochezia. He is asymptomatic from the anemia. I will observe for now.  He does not require transfusion now. I will continue the chemotherapy at current dose without dosage adjustment.  If the anemia gets progressive worse in the future, I might have to delay his treatment or adjust the chemotherapy dose.  

## 2014-10-04 NOTE — Patient Instructions (Signed)
[facility] Discharge Instructions for Patients Receiving Chemotherapy  Today you received the following chemotherapy agents Abraxane and Carboplatin.  To help prevent nausea and vomiting after your treatment, we encourage you to take your nausea medication.   If you develop nausea and vomiting that is not controlled by your nausea medication, call the clinic.   BELOW ARE SYMPTOMS THAT SHOULD BE REPORTED IMMEDIATELY:  *FEVER GREATER THAN 100.5 F  *CHILLS WITH OR WITHOUT FEVER  NAUSEA AND VOMITING THAT IS NOT CONTROLLED WITH YOUR NAUSEA MEDICATION  *UNUSUAL SHORTNESS OF BREATH  *UNUSUAL BRUISING OR BLEEDING  TENDERNESS IN MOUTH AND THROAT WITH OR WITHOUT PRESENCE OF ULCERS  *URINARY PROBLEMS  *BOWEL PROBLEMS  UNUSUAL RASH Items with * indicate a potential emergency and should be followed up as soon as possible.  Feel free to call the clinic you have any questions or concerns. The clinic phone number is [phone].

## 2014-10-04 NOTE — Assessment & Plan Note (Signed)
I discussed narcotic refill policy. I gave him prescription of morphine sulfate as needed for back pain.

## 2014-10-04 NOTE — Assessment & Plan Note (Signed)
This is likely due to recent treatment. The patient denies recent history of bleeding such as epistaxis, hematuria or hematochezia. He is asymptomatic from the low platelet count. I will observe for now.  he does not require transfusion now. I will continue the chemotherapy at current dose without dosage adjustment.  If the thrombocytopenia gets progressive worse in the future, I might have to delay his treatment or adjust the chemotherapy dose.

## 2014-10-04 NOTE — Telephone Encounter (Signed)
gv adn printed appt sched and avs for pt for OCT adn NOV...sed added tx.

## 2014-10-04 NOTE — Assessment & Plan Note (Signed)
I spent some time counseling the patient the importance of tobacco cessation. he is currently attempting to quit on his own

## 2014-10-04 NOTE — Assessment & Plan Note (Signed)
He tolerated chemotherapy very well. I plan to order repeat staging scan after current cycle of treatment. We will continue current dose without adjustment.

## 2014-10-04 NOTE — Assessment & Plan Note (Signed)
I will monitor neuropathy carefully while on chemotherapy. He is currently taking medication for this. This has not progressed since the start of treatment.

## 2014-10-04 NOTE — Patient Instructions (Signed)

## 2014-10-04 NOTE — Assessment & Plan Note (Signed)
This is likely due to recent treatment. The patient denies recent history of fevers, cough, chills, diarrhea or dysuria. He is asymptomatic from the leukopenia. I will observe for now.  I will continue the chemotherapy at current dose without dosage adjustment.  If the leukopenia gets progressive worse in the future, I might have to delay his treatment or adjust the chemotherapy dose. 

## 2014-10-10 ENCOUNTER — Telehealth: Payer: Self-pay | Admitting: *Deleted

## 2014-10-10 NOTE — Telephone Encounter (Signed)
Left VM for pt to keep his chemo appt tomorrow as scheduled. Asked him to call back if any questions,.

## 2014-10-10 NOTE — Telephone Encounter (Signed)
Pt reports congestion and body aches x 3 days.  Denies fever. Denies sore throat or coughing.  He thinks he has a cold and asks if ok to proceed w/ his chemo appt as scheduled tomorrow?

## 2014-10-10 NOTE — Telephone Encounter (Signed)
OK to treat if symtpoms are minor

## 2014-10-11 ENCOUNTER — Encounter: Payer: Self-pay | Admitting: Hematology and Oncology

## 2014-10-11 ENCOUNTER — Other Ambulatory Visit: Payer: Self-pay | Admitting: Hematology and Oncology

## 2014-10-11 ENCOUNTER — Ambulatory Visit (HOSPITAL_BASED_OUTPATIENT_CLINIC_OR_DEPARTMENT_OTHER): Payer: Medicaid Other | Admitting: Hematology and Oncology

## 2014-10-11 ENCOUNTER — Ambulatory Visit: Payer: Medicaid Other

## 2014-10-11 ENCOUNTER — Other Ambulatory Visit (HOSPITAL_BASED_OUTPATIENT_CLINIC_OR_DEPARTMENT_OTHER): Payer: Medicaid Other

## 2014-10-11 ENCOUNTER — Ambulatory Visit (HOSPITAL_BASED_OUTPATIENT_CLINIC_OR_DEPARTMENT_OTHER): Payer: Medicaid Other

## 2014-10-11 ENCOUNTER — Other Ambulatory Visit: Payer: Self-pay | Admitting: *Deleted

## 2014-10-11 VITALS — BP 128/79 | HR 91 | Temp 98.0°F | Resp 18

## 2014-10-11 VITALS — BP 128/79 | HR 91 | Temp 98.0°F | Resp 18 | Ht 75.0 in | Wt 236.1 lb

## 2014-10-11 DIAGNOSIS — R197 Diarrhea, unspecified: Secondary | ICD-10-CM

## 2014-10-11 DIAGNOSIS — D72829 Elevated white blood cell count, unspecified: Secondary | ICD-10-CM

## 2014-10-11 DIAGNOSIS — R112 Nausea with vomiting, unspecified: Secondary | ICD-10-CM

## 2014-10-11 DIAGNOSIS — R05 Cough: Secondary | ICD-10-CM

## 2014-10-11 DIAGNOSIS — C3400 Malignant neoplasm of unspecified main bronchus: Secondary | ICD-10-CM

## 2014-10-11 DIAGNOSIS — N179 Acute kidney failure, unspecified: Secondary | ICD-10-CM

## 2014-10-11 DIAGNOSIS — C7931 Secondary malignant neoplasm of brain: Secondary | ICD-10-CM

## 2014-10-11 DIAGNOSIS — R059 Cough, unspecified: Secondary | ICD-10-CM

## 2014-10-11 DIAGNOSIS — J069 Acute upper respiratory infection, unspecified: Secondary | ICD-10-CM | POA: Insufficient documentation

## 2014-10-11 DIAGNOSIS — C3410 Malignant neoplasm of upper lobe, unspecified bronchus or lung: Secondary | ICD-10-CM

## 2014-10-11 DIAGNOSIS — Z95828 Presence of other vascular implants and grafts: Secondary | ICD-10-CM

## 2014-10-11 DIAGNOSIS — R5383 Other fatigue: Secondary | ICD-10-CM

## 2014-10-11 LAB — INFLUENZA A AND B
Inflenza A Ag: NEGATIVE
Influenza B Ag: NEGATIVE

## 2014-10-11 LAB — CBC WITH DIFFERENTIAL/PLATELET
BASO%: 0.4 % (ref 0.0–2.0)
Basophils Absolute: 0 10*3/uL (ref 0.0–0.1)
EOS%: 2 % (ref 0.0–7.0)
Eosinophils Absolute: 0.1 10*3/uL (ref 0.0–0.5)
HCT: 39.8 % (ref 38.4–49.9)
HEMOGLOBIN: 13 g/dL (ref 13.0–17.1)
LYMPH%: 13.6 % — ABNORMAL LOW (ref 14.0–49.0)
MCH: 27.4 pg (ref 27.2–33.4)
MCHC: 32.7 g/dL (ref 32.0–36.0)
MCV: 83.8 fL (ref 79.3–98.0)
MONO#: 0.3 10*3/uL (ref 0.1–0.9)
MONO%: 6.1 % (ref 0.0–14.0)
NEUT#: 4 10*3/uL (ref 1.5–6.5)
NEUT%: 77.9 % — ABNORMAL HIGH (ref 39.0–75.0)
PLATELETS: 150 10*3/uL (ref 140–400)
RBC: 4.75 10*6/uL (ref 4.20–5.82)
RDW: 17.3 % — AB (ref 11.0–14.6)
WBC: 5.1 10*3/uL (ref 4.0–10.3)
lymph#: 0.7 10*3/uL — ABNORMAL LOW (ref 0.9–3.3)

## 2014-10-11 LAB — COMPREHENSIVE METABOLIC PANEL (CC13)
ALK PHOS: 72 U/L (ref 40–150)
ALT: 18 U/L (ref 0–55)
ANION GAP: 8 meq/L (ref 3–11)
AST: 11 U/L (ref 5–34)
Albumin: 3.5 g/dL (ref 3.5–5.0)
BUN: 17.4 mg/dL (ref 7.0–26.0)
CALCIUM: 9.6 mg/dL (ref 8.4–10.4)
CO2: 25 mEq/L (ref 22–29)
Chloride: 107 mEq/L (ref 98–109)
Creatinine: 1.4 mg/dL — ABNORMAL HIGH (ref 0.7–1.3)
GLUCOSE: 113 mg/dL (ref 70–140)
Potassium: 4.2 mEq/L (ref 3.5–5.1)
Sodium: 139 mEq/L (ref 136–145)
Total Bilirubin: 0.42 mg/dL (ref 0.20–1.20)
Total Protein: 6.8 g/dL (ref 6.4–8.3)

## 2014-10-11 MED ORDER — SODIUM CHLORIDE 0.9 % IJ SOLN
10.0000 mL | INTRAMUSCULAR | Status: DC | PRN
  Administered 2014-10-11: 10 mL
  Filled 2014-10-11: qty 10

## 2014-10-11 MED ORDER — SODIUM CHLORIDE 0.9 % IV SOLN
1000.0000 mL | Freq: Once | INTRAVENOUS | Status: AC
Start: 2014-10-11 — End: 2014-10-11
  Administered 2014-10-11: 15:00:00 via INTRAVENOUS

## 2014-10-11 MED ORDER — SODIUM CHLORIDE 0.9 % IJ SOLN
10.0000 mL | INTRAMUSCULAR | Status: DC | PRN
  Administered 2014-10-11: 10 mL via INTRAVENOUS
  Filled 2014-10-11: qty 10

## 2014-10-11 MED ORDER — HEPARIN SOD (PORK) LOCK FLUSH 100 UNIT/ML IV SOLN
500.0000 [IU] | Freq: Once | INTRAVENOUS | Status: AC | PRN
  Administered 2014-10-11: 500 [IU]
  Filled 2014-10-11: qty 5

## 2014-10-11 MED ORDER — ONDANSETRON 8 MG/NS 50 ML IVPB
INTRAVENOUS | Status: AC
  Filled 2014-10-11: qty 8

## 2014-10-11 MED ORDER — ONDANSETRON 8 MG/50ML IVPB (CHCC)
8.0000 mg | Freq: Once | INTRAVENOUS | Status: AC
  Administered 2014-10-11: 8 mg via INTRAVENOUS

## 2014-10-11 NOTE — Assessment & Plan Note (Signed)
He has recent nausea, vomiting and diarrhea. Clinically, he appeared dehydrated with associated acute kidney injury. I recommend intravenous fluid resuscitation.

## 2014-10-11 NOTE — Assessment & Plan Note (Signed)
This is not related to chemotherapy. I recommend anti-emetics as needed.

## 2014-10-11 NOTE — Progress Notes (Signed)
[facility] OFFICE PROGRESS NOTE  Patient Care Team: Lorayne Marek, MD as PCP - General (Internal Medicine) Rigoberto Noel, MD as Attending Physician (Pulmonary Disease) Louellen Molder, MD as Attending Physician (Internal Medicine) Heath Lark, MD as Consulting Physician (Hematology and Oncology)  SUMMARY OF ONCOLOGIC HISTORY:   Metastatic adenocarcinoma to brain   03/15/2013 Surgery Biopsy of mediastinum mass show poorly differentiated carcinoma, suspicious for squamous cell carcinoma   03/15/2013 Imaging PET scan showed a large hypermetabolic mass centered over the prevascular lymph node region and extending into the left upper lobe. This is concerning for bronchogenic carcinoma   04/18/2013 - 05/27/2013 Radiation Therapy He received concurrent chemoradiation treatment   04/18/2013 - 05/27/2013 Chemotherapy He received weekly carboplatin and Taxol with radiation treatment   07/01/2013 Surgery He had EGD, biopsy, laryngoscopy and tonsillectomy which showed no evidence of cancer   08/05/2013 Imaging Repeat CT scan showed marked interval response to therapy    02/22/2014 Imaging CT scan showed minimal progression of radiation changes in the left lung with increase in trace left-sided pleural fluid. Slight decrease can soft tissue fullness    06/22/2014 Imaging CT scan of the abdomen showed no evidence of cancer   07/13/2014 - 07/15/2014 Hospital Admission He was admitted to the hospital of the syncopal episode. Imaging studies showed metastatic cancer to the brain. Radiation oncologist and neurosurgeon work consult for local therapy.   07/13/2014 Imaging MRI brain showed two new enhancing right temporal lobe lesions, consistent with metastases. Extensive vasogenic edema results an 10 mm midline shift   07/17/2014 - 07/17/2014 Radiation Therapy He received one dose of stereotactic radiosurgery   08/23/2014 -  Chemotherapy The patient is on palliative chemotherapy with carboplatin and Abraxane, 2 weeks on  one-week off    INTERVAL HISTORY: Please see below for problem oriented charting. He is seen urgently today. The patient is supposed to get day 8 chemotherapy. However, he complained of nausea, vomiting, diarrhea, flulike illness with diffuse myalgias, arthralgias, upper respiratory congestion with mild productive cough. He had chills last night but no fever.  REVIEW OF SYSTEMS:   Eyes: Denies blurriness of vision Ears, nose, mouth, throat, and face: Denies mucositis or sore throat Cardiovascular: Denies palpitation, chest discomfort or lower extremity swelling Skin: Denies abnormal skin rashes Lymphatics: Denies new lymphadenopathy or easy bruising Neurological:Denies numbness, tingling or new weaknesses Behavioral/Psych: Mood is stable, no new changes  All other systems were reviewed with the patient and are negative.  I have reviewed the past medical history, past surgical history, social history and family history with the patient and they are unchanged from previous note.  ALLERGIES:  has No Known Allergies.  MEDICATIONS:  Current Outpatient Prescriptions  Medication Sig Dispense Refill  . ACCU-CHEK FASTCLIX LANCETS MISC 1 each by Does not apply route daily.      Marland Kitchen gabapentin (NEURONTIN) 400 MG capsule Take 2 capsules (800 mg total) by mouth 3 (three) times daily.  180 capsule  2  . glimepiride (AMARYL) 4 MG tablet Take 1 tablet (4 mg total) by mouth 2 (two) times daily.  60 tablet  3  . lidocaine-prilocaine (EMLA) cream Apply 1 application topically as needed (for port access).      . morphine (MSIR) 15 MG tablet Take 1 tablet (15 mg total) by mouth every 4 (four) hours as needed.  60 tablet  0  . ondansetron (ZOFRAN) 8 MG tablet Take 1 tablet (8 mg total) by mouth every 8 (eight) hours as needed  for nausea.  30 tablet  1  . OxyCODONE (OXYCONTIN) 15 mg T12A 12 hr tablet Take 15 mg by mouth every 12 (twelve) hours.      . prochlorperazine (COMPAZINE) 10 MG tablet Take 1 tablet (10  mg total) by mouth every 6 (six) hours as needed (Nausea or vomiting).  30 tablet  1  . ramipril (ALTACE) 2.5 MG capsule Take 2.5 mg by mouth every morning.      . simvastatin (ZOCOR) 20 MG tablet Take 20 mg by mouth at bedtime.       . sucralfate (CARAFATE) 1 GM/10ML suspension Take 10 mLs (1 g total) by mouth 4 (four) times daily -  with meals and at bedtime.  420 mL  3   No current facility-administered medications for this visit.   Facility-Administered Medications Ordered in Other Visits  Medication Dose Route Frequency Provider Last Rate Last Dose  . sodium chloride 0.9 % injection 10 mL  10 mL Intracatheter PRN Heath Lark, MD   10 mL at 10/11/14 1653    PHYSICAL EXAMINATION: ECOG PERFORMANCE STATUS: 1 - Symptomatic but completely ambulatory  Filed Vitals:   10/11/14 1506  BP: 128/79  Pulse: 91  Temp: 98 F (36.7 C)  Resp: 18   Filed Weights   10/11/14 1506  Weight: 236 lb 1.6 oz (107.094 kg)    GENERAL:alert, no distress and comfortable SKIN: skin color, texture, turgor are normal, no rashes or significant lesions EYES: normal, Conjunctiva are pink and non-injected, sclera clear OROPHARYNX:no exudate, no erythema and lips, buccal mucosa, and tongue normal  NECK: supple, thyroid normal size, non-tender, without nodularity LYMPH:  no palpable lymphadenopathy in the cervical, axillary or inguinal LUNGS: clear to auscultation and percussion with normal breathing effort HEART: regular rate & rhythm and no murmurs and no lower extremity edema ABDOMEN:abdomen soft, non-tender and normal bowel sounds Musculoskeletal:no cyanosis of digits and no clubbing  NEURO: alert & oriented x 3 with fluent speech, no focal motor/sensory deficits  LABORATORY DATA:  I have reviewed the data as listed    Component Value Date/Time   NA 139 10/11/2014 1253   NA 142 10/04/2014 0930   K 4.2 10/11/2014 1253   K 4.1 10/04/2014 0930   CL 105 10/04/2014 0930   CL 104 06/06/2013 0841   CO2 25  10/11/2014 1253   CO2 26 10/04/2014 0930   GLUCOSE 113 10/11/2014 1253   GLUCOSE 128* 10/04/2014 0930   GLUCOSE 258* 06/06/2013 0841   BUN 17.4 10/11/2014 1253   BUN 12 10/04/2014 0930   CREATININE 1.4* 10/11/2014 1253   CREATININE 1.15 10/04/2014 0930   CREATININE 0.94 12/29/2013 0932   CALCIUM 9.6 10/11/2014 1253   CALCIUM 9.3 10/04/2014 0930   PROT 6.8 10/11/2014 1253   PROT 7.0 10/04/2014 0930   ALBUMIN 3.5 10/11/2014 1253   ALBUMIN 3.4* 10/04/2014 0930   AST 11 10/11/2014 1253   AST 16 10/04/2014 0930   ALT 18 10/11/2014 1253   ALT 17 10/04/2014 0930   ALKPHOS 72 10/11/2014 1253   ALKPHOS 68 10/04/2014 0930   BILITOT 0.42 10/11/2014 1253   BILITOT 0.3 10/04/2014 0930   GFRNONAA 68* 08/25/2014 1311   GFRNONAA >89 12/29/2013 0932   GFRAA 78* 08/25/2014 1311   GFRAA >89 12/29/2013 0932    No results found for this basename: SPEP, UPEP,  kappa and lambda light chains    Lab Results  Component Value Date   WBC 5.1 10/11/2014   NEUTROABS 4.0 10/11/2014  HGB 13.0 10/11/2014   HCT 39.8 10/11/2014   MCV 83.8 10/11/2014   PLT 150 10/11/2014      Chemistry      Component Value Date/Time   NA 139 10/11/2014 1253   NA 142 10/04/2014 0930   K 4.2 10/11/2014 1253   K 4.1 10/04/2014 0930   CL 105 10/04/2014 0930   CL 104 06/06/2013 0841   CO2 25 10/11/2014 1253   CO2 26 10/04/2014 0930   BUN 17.4 10/11/2014 1253   BUN 12 10/04/2014 0930   CREATININE 1.4* 10/11/2014 1253   CREATININE 1.15 10/04/2014 0930   CREATININE 0.94 12/29/2013 0932      Component Value Date/Time   CALCIUM 9.6 10/11/2014 1253   CALCIUM 9.3 10/04/2014 0930   ALKPHOS 72 10/11/2014 1253   ALKPHOS 68 10/04/2014 0930   AST 11 10/11/2014 1253   AST 16 10/04/2014 0930   ALT 18 10/11/2014 1253   ALT 17 10/04/2014 0930   BILITOT 0.42 10/11/2014 1253   BILITOT 0.3 10/04/2014 0930      ASSESSMENT & PLAN:  Metastatic adenocarcinoma to brain Due to his acute illness, I will not proceed with  chemotherapy today and we plan to restart treatment next month as previously scheduled.  Viral upper respiratory tract infection Clinically, he appears to have viral upper respiratory tract infection. I would not recommend antibiotics therapy. I will order influenza swab to exclude influenza. The patient has been vaccinated this winter. Recommend conservative management only right now.  AKI (acute kidney injury) He has recent nausea, vomiting and diarrhea. Clinically, he appeared dehydrated with associated acute kidney injury. I recommend intravenous fluid resuscitation.  Diarrhea Suspect this is related to viral illness. I will give him IV fluids and recommend conservative management only.  Nausea and vomiting This is not related to chemotherapy. I recommend anti-emetics as needed.   No orders of the defined types were placed in this encounter.   All questions were answered. The patient knows to call the clinic with any problems, questions or concerns. No barriers to learning was detected. I spent 30 minutes counseling the patient face to face. The total time spent in the appointment was 40 minutes and more than 50% was on counseling and review of test results     [facility], [place], MD 10/11/2014 7:30 PM

## 2014-10-11 NOTE — Progress Notes (Signed)
Patient reports productive cough, chills at home for last 2 days (denies any now), increased fatigue, diarrhea x4-5 episodes in last 24 hours, emesis x1 in last 24 hours, and decreased po intake. Patient reports he has been drinking beer lately because it is all that tastes good, but has not had any today. VSS, afebrile in infusion room. Unable to reach Dr. Alvy Bimler; Selena Lesser, NP notified.    Patient then assessed by Dr. Alvy Bimler. Per MD, hold chemotherapy today and hydrate with IVF. MD placed orders. Nasal swab performed by Jill Alexanders, RN

## 2014-10-11 NOTE — Assessment & Plan Note (Signed)
Clinically, he appears to have viral upper respiratory tract infection. I would not recommend antibiotics therapy. I will order influenza swab to exclude influenza. The patient has been vaccinated this winter. Recommend conservative management only right now.

## 2014-10-11 NOTE — Patient Instructions (Signed)

## 2014-10-11 NOTE — Assessment & Plan Note (Signed)
Due to his acute illness, I will not proceed with chemotherapy today and we plan to restart treatment next month as previously scheduled.

## 2014-10-11 NOTE — Patient Instructions (Signed)
Dehydration, Adult Dehydration is when you lose more fluids from the body than you take in. Vital organs like the kidneys, brain, and heart cannot function without a proper amount of fluids and salt. Any loss of fluids from the body can cause dehydration.  CAUSES   Vomiting.  Diarrhea.  Excessive sweating.  Excessive urine output.  Fever. SYMPTOMS  Mild dehydration  Thirst.  Dry lips.  Slightly dry mouth. Moderate dehydration  Very dry mouth.  Sunken eyes.  Skin does not bounce back quickly when lightly pinched and released.  Dark urine and decreased urine production.  Decreased tear production.  Headache. Severe dehydration  Very dry mouth.  Extreme thirst.  Rapid, weak pulse (more than 100 beats per minute at rest).  Cold hands and feet.  Not able to sweat in spite of heat and temperature.  Rapid breathing.  Blue lips.  Confusion and lethargy.  Difficulty being awakened.  Minimal urine production.  No tears. DIAGNOSIS  Your caregiver will diagnose dehydration based on your symptoms and your exam. Blood and urine tests will help confirm the diagnosis. The diagnostic evaluation should also identify the cause of dehydration. TREATMENT  Treatment of mild or moderate dehydration can often be done at home by increasing the amount of fluids that you drink. It is best to drink small amounts of fluid more often. Drinking too much at one time can make vomiting worse. Refer to the home care instructions below. Severe dehydration needs to be treated at the hospital where you will probably be given intravenous (IV) fluids that contain water and electrolytes. HOME CARE INSTRUCTIONS   Ask your caregiver about specific rehydration instructions.  Drink enough fluids to keep your urine clear or pale yellow.  Drink small amounts frequently if you have nausea and vomiting.  Eat as you normally do.  Avoid:  Foods or drinks high in sugar.  Carbonated  drinks.  Juice.  Extremely hot or cold fluids.  Drinks with caffeine.  Fatty, greasy foods.  Alcohol.  Tobacco.  Overeating.  Gelatin desserts.  Wash your hands well to avoid spreading bacteria and viruses.  Only take over-the-counter or prescription medicines for pain, discomfort, or fever as directed by your caregiver.  Ask your caregiver if you should continue all prescribed and over-the-counter medicines.  Keep all follow-up appointments with your caregiver. SEEK MEDICAL CARE IF:  You have abdominal pain and it increases or stays in one area (localizes).  You have a rash, stiff neck, or severe headache.  You are irritable, sleepy, or difficult to awaken.  You are weak, dizzy, or extremely thirsty. SEEK IMMEDIATE MEDICAL CARE IF:   You are unable to keep fluids down or you get worse despite treatment.  You have frequent episodes of vomiting or diarrhea.  You have blood or green matter (bile) in your vomit.  You have blood in your stool or your stool looks black and tarry.  You have not urinated in 6 to 8 hours, or you have only urinated a small amount of very dark urine.  You have a fever.  You faint. MAKE SURE YOU:   Understand these instructions.  Will watch your condition.  Will get help right away if you are not doing well or get worse. Document Released: 12/01/2005 Document Revised: 02/23/2012 Document Reviewed: 07/21/2011 ExitCare Patient Information 2015 [employer]. This information is not intended to replace advice given to you by your health care provider. Make sure you discuss any questions you have with your health care   provider.  

## 2014-10-11 NOTE — Assessment & Plan Note (Signed)
Suspect this is related to viral illness. I will give him IV fluids and recommend conservative management only.

## 2014-10-12 ENCOUNTER — Ambulatory Visit: Payer: Medicaid Other | Admitting: Hematology and Oncology

## 2014-10-18 ENCOUNTER — Encounter (HOSPITAL_COMMUNITY): Payer: Self-pay

## 2014-10-18 ENCOUNTER — Ambulatory Visit (HOSPITAL_COMMUNITY)
Admission: RE | Admit: 2014-10-18 | Discharge: 2014-10-18 | Disposition: A | Discharge status: Home / Self Care | Payer: Medicaid Other | Source: Ambulatory Visit | Attending: Diagnostic Radiology | Admitting: Diagnostic Radiology

## 2014-10-18 DIAGNOSIS — R911 Solitary pulmonary nodule: Secondary | ICD-10-CM | POA: Diagnosis not present

## 2014-10-18 DIAGNOSIS — C7931 Secondary malignant neoplasm of brain: Secondary | ICD-10-CM

## 2014-10-18 DIAGNOSIS — R591 Generalized enlarged lymph nodes: Secondary | ICD-10-CM | POA: Diagnosis not present

## 2014-10-18 DIAGNOSIS — C3430 Malignant neoplasm of lower lobe, unspecified bronchus or lung: Secondary | ICD-10-CM

## 2014-10-18 DIAGNOSIS — Z923 Personal history of irradiation: Secondary | ICD-10-CM | POA: Diagnosis not present

## 2014-10-18 MED ORDER — IOHEXOL 300 MG/ML  SOLN
80.0000 mL | Freq: Once | INTRAMUSCULAR | Status: AC | PRN
  Administered 2014-10-18: 80 mL via INTRAVENOUS

## 2014-10-20 ENCOUNTER — Ambulatory Visit
Admission: RE | Admit: 2014-10-20 | Discharge: 2014-10-20 | Disposition: A | Discharge status: Home / Self Care | Payer: Medicaid Other | Source: Ambulatory Visit | Attending: Radiation Oncology | Admitting: Radiation Oncology

## 2014-10-20 DIAGNOSIS — C7949 Secondary malignant neoplasm of other parts of nervous system: Principal | ICD-10-CM

## 2014-10-20 DIAGNOSIS — C7931 Secondary malignant neoplasm of brain: Secondary | ICD-10-CM

## 2014-10-23 ENCOUNTER — Emergency Department (HOSPITAL_BASED_OUTPATIENT_CLINIC_OR_DEPARTMENT_OTHER): Payer: Medicaid Other

## 2014-10-23 ENCOUNTER — Encounter (HOSPITAL_BASED_OUTPATIENT_CLINIC_OR_DEPARTMENT_OTHER): Payer: Self-pay | Admitting: *Deleted

## 2014-10-23 ENCOUNTER — Ambulatory Visit: Payer: Medicaid Other | Admitting: Radiation Oncology

## 2014-10-23 ENCOUNTER — Ambulatory Visit: Payer: Medicaid Other

## 2014-10-23 ENCOUNTER — Emergency Department (HOSPITAL_BASED_OUTPATIENT_CLINIC_OR_DEPARTMENT_OTHER)
Admission: EM | Admit: 2014-10-23 | Discharge: 2014-10-23 | Disposition: A | Discharge status: Home / Self Care | Payer: Medicaid Other | Attending: Emergency Medicine | Admitting: Emergency Medicine

## 2014-10-23 DIAGNOSIS — Z923 Personal history of irradiation: Secondary | ICD-10-CM | POA: Diagnosis not present

## 2014-10-23 DIAGNOSIS — R197 Diarrhea, unspecified: Secondary | ICD-10-CM | POA: Diagnosis not present

## 2014-10-23 DIAGNOSIS — J440 Chronic obstructive pulmonary disease with acute lower respiratory infection: Secondary | ICD-10-CM | POA: Diagnosis not present

## 2014-10-23 DIAGNOSIS — H109 Unspecified conjunctivitis: Secondary | ICD-10-CM | POA: Diagnosis not present

## 2014-10-23 DIAGNOSIS — N400 Enlarged prostate without lower urinary tract symptoms: Secondary | ICD-10-CM | POA: Diagnosis not present

## 2014-10-23 DIAGNOSIS — R011 Cardiac murmur, unspecified: Secondary | ICD-10-CM | POA: Diagnosis not present

## 2014-10-23 DIAGNOSIS — C7800 Secondary malignant neoplasm of unspecified lung: Secondary | ICD-10-CM | POA: Insufficient documentation

## 2014-10-23 DIAGNOSIS — F329 Major depressive disorder, single episode, unspecified: Secondary | ICD-10-CM | POA: Insufficient documentation

## 2014-10-23 DIAGNOSIS — R059 Cough, unspecified: Secondary | ICD-10-CM

## 2014-10-23 DIAGNOSIS — I10 Essential (primary) hypertension: Secondary | ICD-10-CM | POA: Insufficient documentation

## 2014-10-23 DIAGNOSIS — E1140 Type 2 diabetes mellitus with diabetic neuropathy, unspecified: Secondary | ICD-10-CM | POA: Insufficient documentation

## 2014-10-23 DIAGNOSIS — G8929 Other chronic pain: Secondary | ICD-10-CM | POA: Insufficient documentation

## 2014-10-23 DIAGNOSIS — Z9981 Dependence on supplemental oxygen: Secondary | ICD-10-CM | POA: Insufficient documentation

## 2014-10-23 DIAGNOSIS — K210 Gastro-esophageal reflux disease with esophagitis: Secondary | ICD-10-CM | POA: Diagnosis not present

## 2014-10-23 DIAGNOSIS — Z79899 Other long term (current) drug therapy: Secondary | ICD-10-CM | POA: Insufficient documentation

## 2014-10-23 DIAGNOSIS — J40 Bronchitis, not specified as acute or chronic: Secondary | ICD-10-CM

## 2014-10-23 DIAGNOSIS — Z8701 Personal history of pneumonia (recurrent): Secondary | ICD-10-CM | POA: Diagnosis not present

## 2014-10-23 DIAGNOSIS — Z862 Personal history of diseases of the blood and blood-forming organs and certain disorders involving the immune mechanism: Secondary | ICD-10-CM | POA: Insufficient documentation

## 2014-10-23 DIAGNOSIS — F419 Anxiety disorder, unspecified: Secondary | ICD-10-CM | POA: Insufficient documentation

## 2014-10-23 DIAGNOSIS — R05 Cough: Secondary | ICD-10-CM | POA: Diagnosis present

## 2014-10-23 DIAGNOSIS — Z720 Tobacco use: Secondary | ICD-10-CM | POA: Diagnosis not present

## 2014-10-23 LAB — BASIC METABOLIC PANEL
Anion gap: 12 (ref 5–15)
BUN: 11 mg/dL (ref 6–23)
CO2: 24 mEq/L (ref 19–32)
Calcium: 8.9 mg/dL (ref 8.4–10.5)
Chloride: 106 mEq/L (ref 96–112)
Creatinine, Ser: 1.2 mg/dL (ref 0.50–1.35)
GFR calc Af Amer: 76 mL/min — ABNORMAL LOW (ref >90)
GFR calc non Af Amer: 65 mL/min — ABNORMAL LOW (ref >90)
GLUCOSE: 69 mg/dL — AB (ref 70–99)
POTASSIUM: 3.8 meq/L (ref 3.7–5.3)
Sodium: 142 mEq/L (ref 137–147)

## 2014-10-23 LAB — HEPATIC FUNCTION PANEL
ALT: 12 U/L (ref 0–53)
AST: 15 U/L (ref 0–37)
Albumin: 3.2 g/dL — ABNORMAL LOW (ref 3.5–5.2)
Alkaline Phosphatase: 67 U/L (ref 39–117)
Bilirubin, Direct: <0.2 mg/dL (ref 0.0–0.3)
Total Bilirubin: 0.5 mg/dL (ref 0.3–1.2)
Total Protein: 6.7 g/dL (ref 6.0–8.3)

## 2014-10-23 LAB — CBC WITH DIFFERENTIAL/PLATELET
Basophils Absolute: 0 10*3/uL (ref 0.0–0.1)
Basophils Relative: 1 % (ref 0–1)
Eosinophils Absolute: 0.1 10*3/uL (ref 0.0–0.7)
Eosinophils Relative: 2 % (ref 0–5)
HCT: 38.3 % — ABNORMAL LOW (ref 39.0–52.0)
HEMOGLOBIN: 12.2 g/dL — AB (ref 13.0–17.0)
LYMPHS ABS: 0.8 10*3/uL (ref 0.7–4.0)
Lymphocytes Relative: 11 % — ABNORMAL LOW (ref 12–46)
MCH: 27.1 pg (ref 26.0–34.0)
MCHC: 31.9 g/dL (ref 30.0–36.0)
MCV: 84.9 fL (ref 78.0–100.0)
MONOS PCT: 11 % (ref 3–12)
Monocytes Absolute: 0.8 10*3/uL (ref 0.1–1.0)
NEUTROS PCT: 75 % (ref 43–77)
Neutro Abs: 5 10*3/uL (ref 1.7–7.7)
Platelets: 154 10*3/uL (ref 150–400)
RBC: 4.51 MIL/uL (ref 4.22–5.81)
RDW: 16.6 % — ABNORMAL HIGH (ref 11.5–15.5)
WBC: 6.6 10*3/uL (ref 4.0–10.5)

## 2014-10-23 MED ORDER — ALBUTEROL SULFATE HFA 108 (90 BASE) MCG/ACT IN AERS: 2.0000 | INHALATION_SPRAY | RESPIRATORY_TRACT | Status: DC | PRN

## 2014-10-23 MED ORDER — AZITHROMYCIN 250 MG PO TABS: 250.0000 mg | ORAL_TABLET | Freq: Every day | ORAL | Status: DC

## 2014-10-23 MED ORDER — PENTAFLUOROPROP-TETRAFLUOROETH EX AERO
INHALATION_SPRAY | CUTANEOUS | Status: AC
  Filled 2014-10-23: qty 30

## 2014-10-23 MED ORDER — HEPARIN SOD (PORK) LOCK FLUSH 100 UNIT/ML IV SOLN
INTRAVENOUS | Status: AC
  Administered 2014-10-23: 500 [IU]
  Filled 2014-10-23: qty 5

## 2014-10-23 MED ORDER — NEOMYCIN-POLYMYXIN-DEXAMETH 3.5-10000-0.1 OP OINT
TOPICAL_OINTMENT | Freq: Four times a day (QID) | OPHTHALMIC | Status: DC
  Filled 2014-10-23: qty 3.5

## 2014-10-23 NOTE — ED Provider Notes (Signed)
Plains of cough, congestion for the past 3 weeks he admits to mild shortness of breath he also port discharge from both eyes onset today. On exam patient is alert speaks in paragraphs no respiratory distress . Not ill-appearingHEENT exam supple conjunctival erythema bilaterally with yellowish material on eyelashes.  Neck supple lungs clear auscultation. Medical decision making in light ofsymptoms lasting 3 weeks we'll treat empirically with antibiotic. Also treated with antibiotic eyedrops for conjunctivitis  Orlie Dakin, MD 10/23/14 1617

## 2014-10-23 NOTE — ED Notes (Signed)
Cough with yellow sputum, diarrhea, headache and aching all over. Symptoms x 3 weeks.

## 2014-10-23 NOTE — ED Notes (Signed)
Portacath deaccessed.

## 2014-10-23 NOTE — ED Provider Notes (Signed)
CSN: [id number]     Arrival date & time 10/23/14  1225 History   First MD Initiated Contact with Patient 10/23/14 1401     Chief Complaint  Patient presents with  . Influenza     (Consider location/radiation/quality/duration/timing/severity/associated sxs/prior Treatment) HPI Comments: Pt comes in today with complaint of a productive cough and diarrhea times 3 weeks. Pt is currently undergoing chemo for metastatic lung cancer. Pt denies fever. He states that he has just had so much sputum that he is continuously coughing. States that he was supposed to have chemo last week but because of his cold and his labs showing dehydration he didn't have it. Pt was tested  For flu. No known fever but has had chills. Other family members have been sick. Pt states that he is having general myalgias  The history is provided by the patient. No language interpreter was used.    Past Medical History  Diagnosis Date  . Back pain, chronic   . Mediastinal mass   . Urinary retention   . Diabetic neuropathy   . Diabetes mellitus, type II     takes Gimepimide and Metformin daily  . Enlarged prostate     takes Flomax every evening  . Hypertension     slightly elevated recently;no meds;just watching for now  . COPD (chronic obstructive pulmonary disease)     emphysema  . Shortness of breath     with exertion  . Pneumonia     hx of;last time about 50yrs ago  . History of bronchitis     last time 13yrs ago  . Joint pain     stiffness and cramping in both hands  . Urinary frequency   . Nocturia   . Anemia     no meds and hasn't had a transfusion ever  . Anxiety   . Depression     no meds required;hospitatlized 39yrs ago for this  . Hx of radiation therapy 04/19/13- 06/07/13    left upper central chest, 63 gray 35 fx  . Heart murmur     as a baby  . Diarrhea   . Neuropathy   . Back pain 11/14/2013  . On home O2     2L N/C  . Lung cancer 04/07/2013  . Esophagitis 08/23/2014  . Cough 08/23/2014   Past  Surgical History  Procedure Laterality Date  . Video bronchoscopy with endobronchial ultrasound Bilateral 03/10/2013    Procedure: VIDEO BRONCHOSCOPY WITH ENDOBRONCHIAL ULTRASOUND;  Surgeon: Rigoberto Noel, MD;  Location: [facility];  Service: Pulmonary;  Laterality: Bilateral;  . Mediastinotomy chamberlain mcneil Left 03/24/2013    Procedure: MEDIASTINOTOMY CHAMBERLAIN MCNEIL;  Surgeon: Melrose Nakayama, MD;  Location: [facility];  Service: Thoracic;  Laterality: Left;  . Excisional hemorrhoidectomy      > 30 yrs ago  . Colonoscopy    . Direct laryngoscopy N/A 07/01/2013    Procedure: DIRECT LARYNGOSCOPY;  Surgeon: Izora Gala, MD;  Location: [facility];  Service: ENT;  Laterality: N/A;  . Esophagoscopy N/A 07/01/2013    Procedure: ESOPHAGOSCOPY WITH BIOPSY;  Surgeon: Izora Gala, MD;  Location: [facility];  Service: ENT;  Laterality: N/A;  . Direct laryngoscopy N/A 08/01/2013    Procedure: DIRECT LARYNGOSCOPY WITH EXCISION OF PHARYNGEAL MASS;  Surgeon: Izora Gala, MD;  Location: [facility];  Service: ENT;  Laterality: N/A;  . Tonsillectomy Right 08/01/2013    Procedure: TONSILLECTOMY;  Surgeon: Izora Gala, MD;  Location: [facility];  Service: ENT;  Laterality: Right;  .  Craniotomy N/A 07/19/2014    Procedure: CRANIOTOMY TUMOR EXCISION with Stealth;  Surgeon: Consuella Lose, MD;  Location: [facility];  Service: Neurosurgery;  Laterality: N/A;  CRANIOTOMY FOR EXCISION OF TUMOR   Family History  Problem Relation Age of Onset  . Aneurysm Mother   . Heart attack Father   . Aneurysm Sister   . Cancer Brother    History  Substance Use Topics  . Smoking status: Current Every Day Smoker -- 0.50 packs/day for 41 years    Types: Cigarettes    Start date: 07/18/1971  . Smokeless tobacco: Never Used     Comment: nicotene patch  . Alcohol Use: 8.4 oz/week    14 Cans of beer per week     Comment: daily beer 2    Review of Systems  All other systems reviewed and are negative.     Allergies  Review of  patient's allergies indicates no known allergies.  Home Medications   Prior to Admission medications   Medication Sig Start Date End Date Taking? Authorizing Provider  ACCU-CHEK FASTCLIX LANCETS MISC 1 each by Does not apply route daily.    Historical Provider, MD  gabapentin (NEURONTIN) 400 MG capsule Take 2 capsules (800 mg total) by mouth 3 (three) times daily. 09/18/14   Lorayne Marek, MD  glimepiride (AMARYL) 4 MG tablet Take 1 tablet (4 mg total) by mouth 2 (two) times daily. 09/18/14   Lorayne Marek, MD  lidocaine-prilocaine (EMLA) cream Apply 1 application topically as needed (for port access).    Historical Provider, MD  morphine (MSIR) 15 MG tablet Take 1 tablet (15 mg total) by mouth every 4 (four) hours as needed. 10/04/14   Heath Lark, MD  ondansetron (ZOFRAN) 8 MG tablet Take 1 tablet (8 mg total) by mouth every 8 (eight) hours as needed for nausea. 08/23/14   Heath Lark, MD  OxyCODONE (OXYCONTIN) 15 mg T12A 12 hr tablet Take 15 mg by mouth every 12 (twelve) hours.    Historical Provider, MD  prochlorperazine (COMPAZINE) 10 MG tablet Take 1 tablet (10 mg total) by mouth every 6 (six) hours as needed (Nausea or vomiting). 08/23/14   Heath Lark, MD  ramipril (ALTACE) 2.5 MG capsule Take 2.5 mg by mouth every morning.    Historical Provider, MD  simvastatin (ZOCOR) 20 MG tablet Take 20 mg by mouth at bedtime.     Historical Provider, MD  sucralfate (CARAFATE) 1 GM/10ML suspension Take 10 mLs (1 g total) by mouth 4 (four) times daily -  with meals and at bedtime. 08/23/14   Ni Gorsuch, MD   BP 125/75 mmHg  Pulse 95  Temp(Src) 98.3 F (36.8 C) (Oral)  Resp 20  Ht 6\' 3"  (1.905 m)  Wt 235 lb (106.595 kg)  BMI 29.37 kg/m2  SpO2 95% Physical Exam  Constitutional: He is oriented to person, place, and time. He appears well-developed and well-nourished.  HENT:  Right Ear: External ear normal.  Left Ear: External ear normal.  Nose: Rhinorrhea present.  Mouth/Throat: Posterior oropharyngeal  erythema present.  Eyes: EOM are normal.  Bilateral redness to conjunctive and yellow drainage noted  Cardiovascular: Normal rate and regular rhythm.   Pulmonary/Chest: Effort normal and breath sounds normal.  Neurological: He is alert and oriented to person, place, and time.  Skin: Skin is warm and dry.  Psychiatric: He has a normal mood and affect.  Nursing note and vitals reviewed.   ED Course  Procedures (including critical care time) Labs Review  Labs Reviewed  CBC WITH DIFFERENTIAL - Abnormal; Notable for the following:    Hemoglobin 12.2 (*)    HCT 38.3 (*)    RDW 16.6 (*)    Lymphocytes Relative 11 (*)    All other components within normal limits  BASIC METABOLIC PANEL - Abnormal; Notable for the following:    Glucose, Bld 69 (*)    GFR calc non Af Amer 65 (*)    GFR calc Af Amer 76 (*)    All other components within normal limits  HEPATIC FUNCTION PANEL - Abnormal; Notable for the following:    Albumin 3.2 (*)    All other components within normal limits    Imaging Review Dg Chest 2 View  10/23/2014   CLINICAL DATA:  Cough and congestion for 3 weeks.  EXAM: CHEST  2 VIEW  COMPARISON:  Chest x-ray June 22, 2014  FINDINGS: The heart size and mediastinal contours are stable. Emphysematous changes of bilateral lungs particularly in the upper lobes are unchanged. Chronic increased pulmonary interstitium are identified in bilateral lung bases unchanged. There is scar in the left lung base. There is no focal infiltrate, pulmonary edema, or pleural effusion. The visualized skeletal structures are stable.  IMPRESSION: No active cardiopulmonary disease. Emphysematous changes stable compared to prior exam.   Electronically Signed   By: Abelardo Diesel M.D.   On: 10/23/2014 14:59     EKG Interpretation None      MDM   Final diagnoses:  Cough  Bilateral conjunctivitis  Bronchitis    Pt in no distress at this time. Will treat conjunctivitis with neomycin-polymyxin. Due to  prolonged period of symptoms will treat with antibiotics and inhaler. Pt in no distress and vitals are stable    Glendell Docker, NP 10/23/14 [place], MD 10/23/14 [id number]

## 2014-10-23 NOTE — Discharge Instructions (Signed)

## 2014-10-23 NOTE — ED Notes (Signed)
Pt reports flu like symptoms and diarrhea x 3 weeks. Denies fever. States daughter was sick with the flu a few weeks ago and got it from her.

## 2014-10-24 ENCOUNTER — Ambulatory Visit: Payer: Medicaid Other | Attending: Radiation Oncology | Admitting: Radiation Oncology

## 2014-10-25 ENCOUNTER — Telehealth: Payer: Self-pay | Admitting: Hematology and Oncology

## 2014-10-25 ENCOUNTER — Ambulatory Visit: Payer: Medicaid Other

## 2014-10-25 ENCOUNTER — Other Ambulatory Visit (HOSPITAL_BASED_OUTPATIENT_CLINIC_OR_DEPARTMENT_OTHER): Payer: Medicaid Other

## 2014-10-25 ENCOUNTER — Encounter: Payer: Self-pay | Admitting: Hematology and Oncology

## 2014-10-25 ENCOUNTER — Ambulatory Visit (HOSPITAL_BASED_OUTPATIENT_CLINIC_OR_DEPARTMENT_OTHER): Payer: Medicaid Other | Admitting: Hematology and Oncology

## 2014-10-25 VITALS — BP 120/69 | HR 102 | Temp 97.4°F | Resp 18 | Ht 75.0 in | Wt 231.6 lb

## 2014-10-25 DIAGNOSIS — D630 Anemia in neoplastic disease: Secondary | ICD-10-CM

## 2014-10-25 DIAGNOSIS — J069 Acute upper respiratory infection, unspecified: Secondary | ICD-10-CM

## 2014-10-25 DIAGNOSIS — C3400 Malignant neoplasm of unspecified main bronchus: Secondary | ICD-10-CM

## 2014-10-25 DIAGNOSIS — C801 Malignant (primary) neoplasm, unspecified: Secondary | ICD-10-CM

## 2014-10-25 DIAGNOSIS — Z720 Tobacco use: Secondary | ICD-10-CM

## 2014-10-25 DIAGNOSIS — Z95828 Presence of other vascular implants and grafts: Secondary | ICD-10-CM

## 2014-10-25 DIAGNOSIS — R05 Cough: Secondary | ICD-10-CM

## 2014-10-25 DIAGNOSIS — C7931 Secondary malignant neoplasm of brain: Secondary | ICD-10-CM

## 2014-10-25 DIAGNOSIS — R059 Cough, unspecified: Secondary | ICD-10-CM

## 2014-10-25 LAB — CBC WITH DIFFERENTIAL/PLATELET
BASO%: 0.7 % (ref 0.0–2.0)
BASOS ABS: 0.1 10*3/uL (ref 0.0–0.1)
EOS ABS: 0.1 10*3/uL (ref 0.0–0.5)
EOS%: 0.9 % (ref 0.0–7.0)
HCT: 39.4 % (ref 38.4–49.9)
HEMOGLOBIN: 12.5 g/dL — AB (ref 13.0–17.1)
LYMPH%: 10.5 % — ABNORMAL LOW (ref 14.0–49.0)
MCH: 26.9 pg — ABNORMAL LOW (ref 27.2–33.4)
MCHC: 31.7 g/dL — ABNORMAL LOW (ref 32.0–36.0)
MCV: 84.9 fL (ref 79.3–98.0)
MONO#: 0.9 10*3/uL (ref 0.1–0.9)
MONO%: 12.2 % (ref 0.0–14.0)
NEUT#: 5.3 10*3/uL (ref 1.5–6.5)
NEUT%: 75.7 % — ABNORMAL HIGH (ref 39.0–75.0)
Platelets: 191 10*3/uL (ref 140–400)
RBC: 4.64 10*6/uL (ref 4.20–5.82)
RDW: 17.4 % — AB (ref 11.0–14.6)
WBC: 7.1 10*3/uL (ref 4.0–10.3)
lymph#: 0.7 10*3/uL — ABNORMAL LOW (ref 0.9–3.3)

## 2014-10-25 LAB — COMPREHENSIVE METABOLIC PANEL (CC13)
ALBUMIN: 3.2 g/dL — AB (ref 3.5–5.0)
ALT: 13 U/L (ref 0–55)
ANION GAP: 8 meq/L (ref 3–11)
AST: 15 U/L (ref 5–34)
Alkaline Phosphatase: 66 U/L (ref 40–150)
BUN: 9.3 mg/dL (ref 7.0–26.0)
CALCIUM: 9.4 mg/dL (ref 8.4–10.4)
CO2: 25 mEq/L (ref 22–29)
Chloride: 107 mEq/L (ref 98–109)
Creatinine: 1.2 mg/dL (ref 0.7–1.3)
Glucose: 96 mg/dl (ref 70–140)
POTASSIUM: 3.7 meq/L (ref 3.5–5.1)
Sodium: 140 mEq/L (ref 136–145)
Total Bilirubin: 0.52 mg/dL (ref 0.20–1.20)
Total Protein: 6.9 g/dL (ref 6.4–8.3)

## 2014-10-25 MED ORDER — SODIUM CHLORIDE 0.9 % IJ SOLN
10.0000 mL | INTRAMUSCULAR | Status: DC | PRN
  Administered 2014-10-25: 10 mL via INTRAVENOUS
  Filled 2014-10-25: qty 10

## 2014-10-25 MED ORDER — GUAIFENESIN-CODEINE 100-10 MG/5ML PO SOLN: 5.0000 mL | Freq: Four times a day (QID) | ORAL | Status: AC | PRN

## 2014-10-25 NOTE — Progress Notes (Signed)
[facility] OFFICE PROGRESS NOTE  Patient Care Team: Lorayne Marek, MD as PCP - General (Internal Medicine) Rigoberto Noel, MD as Attending Physician (Pulmonary Disease) Louellen Molder, MD as Attending Physician (Internal Medicine) Heath Lark, MD as Consulting Physician (Hematology and Oncology)  SUMMARY OF ONCOLOGIC HISTORY:   Metastatic adenocarcinoma to brain   03/15/2013 Surgery Biopsy of mediastinum mass show poorly differentiated carcinoma, suspicious for squamous cell carcinoma   03/15/2013 Imaging PET scan showed a large hypermetabolic mass centered over the prevascular lymph node region and extending into the left upper lobe. This is concerning for bronchogenic carcinoma   04/18/2013 - 05/27/2013 Radiation Therapy He received concurrent chemoradiation treatment   04/18/2013 - 05/27/2013 Chemotherapy He received weekly carboplatin and Taxol with radiation treatment   07/01/2013 Surgery He had EGD, biopsy, laryngoscopy and tonsillectomy which showed no evidence of cancer   08/05/2013 Imaging Repeat CT scan showed marked interval response to therapy    02/22/2014 Imaging CT scan showed minimal progression of radiation changes in the left lung with increase in trace left-sided pleural fluid. Slight decrease can soft tissue fullness    06/22/2014 Imaging CT scan of the abdomen showed no evidence of cancer   07/13/2014 - 07/15/2014 Hospital Admission He was admitted to the hospital of the syncopal episode. Imaging studies showed metastatic cancer to the brain. Radiation oncologist and neurosurgeon work consult for local therapy.   07/13/2014 Imaging MRI brain showed two new enhancing right temporal lobe lesions, consistent with metastases. Extensive vasogenic edema results an 10 mm midline shift   07/17/2014 - 07/17/2014 Radiation Therapy He received one dose of stereotactic radiosurgery   08/23/2014 - 10/04/2014 Chemotherapy The patient is on palliative chemotherapy with carboplatin and Abraxane, 2  weeks on one-week off. Treatment was discontinued due to undesirable side effects.   10/18/2014 Imaging CT scan of the chest showed no evidence of disease progression    INTERVAL HISTORY: Please see below for problem oriented charting. The patient is seen today before chemotherapy. He recently went to the emergency department because of persistent cough. Denies fevers or chills. Denies hemoptysis.he continues to smoke. He denies headaches or neurological deficit. Denies worsening peripheral neuropathy. He could not complete his MRI because of cough.  REVIEW OF SYSTEMS:   Constitutional: Denies fevers, chills or abnormal weight loss Eyes: Denies blurriness of vision Ears, nose, mouth, throat, and face: Denies mucositis or sore throat Cardiovascular: Denies palpitation, chest discomfort or lower extremity swelling Gastrointestinal:  Denies nausea, heartburn or change in bowel habits Skin: Denies abnormal skin rashes Lymphatics: Denies new lymphadenopathy or easy bruising Neurological:Denies numbness, tingling or new weaknesses Behavioral/Psych: Mood is stable, no new changes  All other systems were reviewed with the patient and are negative.  I have reviewed the past medical history, past surgical history, social history and family history with the patient and they are unchanged from previous note.  ALLERGIES:  has No Known Allergies.  MEDICATIONS:  Current Outpatient Prescriptions  Medication Sig Dispense Refill  . ACCU-CHEK FASTCLIX LANCETS MISC 1 each by Does not apply route daily.    Marland Kitchen azithromycin (ZITHROMAX) 250 MG tablet Take 1 tablet (250 mg total) by mouth daily. Take first 2 tablets together, then 1 every day until finished. 6 tablet 0  . gabapentin (NEURONTIN) 400 MG capsule Take 2 capsules (800 mg total) by mouth 3 (three) times daily. 180 capsule 2  . glimepiride (AMARYL) 4 MG tablet Take 1 tablet (4 mg total) by mouth 2 (two) times  daily. 60 tablet 3  .  lidocaine-prilocaine (EMLA) cream Apply 1 application topically as needed (for port access).    . morphine (MSIR) 15 MG tablet Take 1 tablet (15 mg total) by mouth every 4 (four) hours as needed. 60 tablet 0  . ondansetron (ZOFRAN) 8 MG tablet Take 1 tablet (8 mg total) by mouth every 8 (eight) hours as needed for nausea. 30 tablet 1  . OxyCODONE (OXYCONTIN) 15 mg T12A 12 hr tablet Take 15 mg by mouth every 12 (twelve) hours.    . prochlorperazine (COMPAZINE) 10 MG tablet Take 1 tablet (10 mg total) by mouth every 6 (six) hours as needed (Nausea or vomiting). 30 tablet 1  . ramipril (ALTACE) 2.5 MG capsule Take 2.5 mg by mouth every morning.    . simvastatin (ZOCOR) 20 MG tablet Take 20 mg by mouth at bedtime.     . sucralfate (CARAFATE) 1 GM/10ML suspension Take 10 mLs (1 g total) by mouth 4 (four) times daily -  with meals and at bedtime. 420 mL 3  . guaiFENesin-codeine 100-10 MG/5ML syrup Take 5 mLs by mouth every 6 (six) hours as needed for cough. 473 mL 0   No current facility-administered medications for this visit.    PHYSICAL EXAMINATION: ECOG PERFORMANCE STATUS: 1 - Symptomatic but completely ambulatory  Filed Vitals:   10/25/14 1040  BP: 120/69  Pulse: 102  Temp: 97.4 F (36.3 C)  Resp: 18   Filed Weights   10/25/14 1040  Weight: 231 lb 9.6 oz (105.053 kg)    GENERAL:alert, no distress and comfortable SKIN: skin color, texture, turgor are normal, no rashes or significant lesions EYES: normal, Conjunctiva are pink and non-injected, sclera clear OROPHARYNX:no exudate, no erythema and lips, buccal mucosa, and tongue normal  NECK: supple, thyroid normal size, non-tender, without nodularity LYMPH:  no palpable lymphadenopathy in the cervical, axillary or inguinal LUNGS: clear to auscultation and percussion with normal breathing effort HEART: regular rate & rhythm and no murmurs and no lower extremity edema ABDOMEN:abdomen soft, non-tender and normal bowel  sounds Musculoskeletal:no cyanosis of digits and no clubbing  NEURO: alert & oriented x 3 with fluent speech, no focal motor/sensory deficits  LABORATORY DATA:  I have reviewed the data as listed    Component Value Date/Time   NA 140 10/25/2014 1020   NA 142 10/23/2014 1425   K 3.7 10/25/2014 1020   K 3.8 10/23/2014 1425   CL 106 10/23/2014 1425   CL 104 06/06/2013 0841   CO2 25 10/25/2014 1020   CO2 24 10/23/2014 1425   GLUCOSE 96 10/25/2014 1020   GLUCOSE 69* 10/23/2014 1425   GLUCOSE 258* 06/06/2013 0841   BUN 9.3 10/25/2014 1020   BUN 11 10/23/2014 1425   CREATININE 1.2 10/25/2014 1020   CREATININE 1.20 10/23/2014 1425   CREATININE 0.94 12/29/2013 0932   CALCIUM 9.4 10/25/2014 1020   CALCIUM 8.9 10/23/2014 1425   PROT 6.9 10/25/2014 1020   PROT 6.7 10/23/2014 1425   ALBUMIN 3.2* 10/25/2014 1020   ALBUMIN 3.2* 10/23/2014 1425   AST 15 10/25/2014 1020   AST 15 10/23/2014 1425   ALT 13 10/25/2014 1020   ALT 12 10/23/2014 1425   ALKPHOS 66 10/25/2014 1020   ALKPHOS 67 10/23/2014 1425   BILITOT 0.52 10/25/2014 1020   BILITOT 0.5 10/23/2014 1425   GFRNONAA 65* 10/23/2014 1425   GFRNONAA >89 12/29/2013 0932   GFRAA 76* 10/23/2014 1425   GFRAA >89 12/29/2013 0932  No results found for: SPEP, UPEP  Lab Results  Component Value Date   WBC 7.1 10/25/2014   NEUTROABS 5.3 10/25/2014   HGB 12.5* 10/25/2014   HCT 39.4 10/25/2014   MCV 84.9 10/25/2014   PLT 191 10/25/2014      Chemistry      Component Value Date/Time   NA 140 10/25/2014 1020   NA 142 10/23/2014 1425   K 3.7 10/25/2014 1020   K 3.8 10/23/2014 1425   CL 106 10/23/2014 1425   CL 104 06/06/2013 0841   CO2 25 10/25/2014 1020   CO2 24 10/23/2014 1425   BUN 9.3 10/25/2014 1020   BUN 11 10/23/2014 1425   CREATININE 1.2 10/25/2014 1020   CREATININE 1.20 10/23/2014 1425   CREATININE 0.94 12/29/2013 0932      Component Value Date/Time   CALCIUM 9.4 10/25/2014 1020   CALCIUM 8.9 10/23/2014 1425    ALKPHOS 66 10/25/2014 1020   ALKPHOS 67 10/23/2014 1425   AST 15 10/25/2014 1020   AST 15 10/23/2014 1425   ALT 13 10/25/2014 1020   ALT 12 10/23/2014 1425   BILITOT 0.52 10/25/2014 1020   BILITOT 0.5 10/23/2014 1425       RADIOGRAPHIC STUDIES:I reviewed the most recent CT scan. I have personally reviewed the radiological images as listed and agreed with the findings in the report.  ASSESSMENT & PLAN:  Metastatic adenocarcinoma to brain The patient tolerated treatment poorly. CT scan show no evidence of disease progression. He does not want to continue more treatment and I will discontinue treatment. I will bring him back every 6 weeks to maintain port patency. MRI of the head is still pending. If MRI of the head is negative, I will observe him with history, physical examination and imaging every 3 months.   Anemia in neoplastic disease This is likely due to recent treatment. The patient denies recent history of bleeding such as epistaxis, hematuria or hematochezia. He is asymptomatic from the anemia. I will observe for now.    Cough I gave him prescription cough suppressant medicine to take as needed.  Tobacco abuse I spent some time counseling the patient the importance of tobacco cessation. he is currently attempting to quit on his own    Viral upper respiratory tract infection Recent chest x-ray show no evidence of pneumonia. He is completing a course of antibiotic therapy.   Orders Placed This Encounter  Procedures  . MR Brain W Contrast    Standing Status: Future     Number of Occurrences:      Standing Expiration Date: 12/25/2015    Order Specific Question:  Reason for Exam (SYMPTOM  OR DIAGNOSIS REQUIRED)    Answer:  brain mets, exclude progression    Order Specific Question:  Preferred imaging location?    Answer:  [facility]    Order Specific Question:  Does the patient have a pacemaker or implanted devices?    Answer:  No    Order Specific  Question:  What is the patient's sedation requirement?    Answer:  No Sedation  . CT Chest W Contrast    Standing Status: Future     Number of Occurrences:      Standing Expiration Date: 12/25/2015    Order Specific Question:  Reason for Exam (SYMPTOM  OR DIAGNOSIS REQUIRED)    Answer:  staging lung cancer    Order Specific Question:  Preferred imaging location?    Answer:  [facility]  All questions were answered. The patient knows to call the clinic with any problems, questions or concerns. No barriers to learning was detected. I spent 40 minutes counseling the patient face to face. The total time spent in the appointment was 55 minutes and more than 50% was on counseling and review of test results     [facility], [place], MD 10/25/2014 3:06 PM

## 2014-10-25 NOTE — Assessment & Plan Note (Signed)
I spent some time counseling the patient the importance of tobacco cessation. he is currently attempting to quit on his own

## 2014-10-25 NOTE — Assessment & Plan Note (Signed)
This is likely due to recent treatment. The patient denies recent history of bleeding such as epistaxis, hematuria or hematochezia. He is asymptomatic from the anemia. I will observe for now.    

## 2014-10-25 NOTE — Patient Instructions (Signed)

## 2014-10-25 NOTE — Assessment & Plan Note (Addendum)
Recent chest x-ray show no evidence of pneumonia. He is completing a course of antibiotic therapy.

## 2014-10-25 NOTE — Assessment & Plan Note (Signed)
I gave him prescription cough suppressant medicine to take as needed.

## 2014-10-25 NOTE — Assessment & Plan Note (Signed)
The patient tolerated treatment poorly. CT scan show no evidence of disease progression. He does not want to continue more treatment and I will discontinue treatment. I will bring him back every 6 weeks to maintain port patency. MRI of the head is still pending. If MRI of the head is negative, I will observe him with history, physical examination and imaging every 3 months.

## 2014-10-25 NOTE — Telephone Encounter (Signed)
gv and printed appt sched and avs for pt for DEC thru Feb 2016

## 2014-10-26 ENCOUNTER — Other Ambulatory Visit: Payer: Self-pay | Admitting: Hematology and Oncology

## 2014-10-27 ENCOUNTER — Other Ambulatory Visit: Payer: Medicaid Other

## 2014-10-30 ENCOUNTER — Ambulatory Visit: Payer: Medicaid Other | Admitting: Radiation Oncology

## 2014-10-31 ENCOUNTER — Telehealth: Payer: Self-pay | Admitting: Radiation Oncology

## 2014-10-31 NOTE — Telephone Encounter (Signed)
Let [name] leave [facility] Dutton a voicemail. He is wanting to reschedule his MRI with [facility]. It was cancelled due to him having the flu.

## 2014-11-01 ENCOUNTER — Ambulatory Visit: Payer: Medicaid Other

## 2014-11-06 ENCOUNTER — Encounter: Payer: Self-pay | Admitting: Radiation Therapy

## 2014-11-06 ENCOUNTER — Ambulatory Visit
Admission: RE | Admit: 2014-11-06 | Discharge: 2014-11-06 | Disposition: A | Discharge status: Home / Self Care | Payer: Medicaid Other | Source: Ambulatory Visit | Attending: Radiation Oncology | Admitting: Radiation Oncology

## 2014-11-06 MED ORDER — GADOBENATE DIMEGLUMINE 529 MG/ML IV SOLN
20.0000 mL | Freq: Once | INTRAVENOUS | Status: AC | PRN
  Administered 2014-11-06: 20 mL via INTRAVENOUS

## 2014-11-06 NOTE — Progress Notes (Signed)
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 His follow-up imaging studies including chest CT through March 2015 showed no evidence of recurrence. On 07/12/14 the patient presented with left hemiparesis and CT and subsequent brain MRI showed 2 isolated right temporal brain mets (2.7 cm post, 1.7 cm ant). Dr. Kathyrn Sheriff evaluated the patient and felt that resection of both may be indicated given the degree of mass effect and in order to prevent recurrence. He was treated with pre-op SRS on 07/17/14 followed by surgical resection on 07/19/14.           During his 8 week follow-up the patient reported occasional headaches, diplopia, decreased appetite, occasional nausea, occasional dry cough, shortness of breath with exertion, and that his long term memory "is shot". There was also a raised nodule on his rt. temple the size of an egg, within an old incision, this was reported as tender and sore to the touch.            Previous Radiation: Indication for treatment: Definitive treatment along with radiosensitizing chemotherapy  Radiation treatment dates: May 6 through June 24  Site/dose: Left upper central chest 63 gray in 35 fractions under the care of Dr. Sondra Come and Dr. Lamonte Sakai.   Pre-Op SRS: 07/17/14  1. Right posterior temporal 2.7 cm target was treated to a prescription dose of 14 Gy.  2. Right anterior temporal 1.7 cm target was treated to a  prescription dose of 20 Gy.   Narrative:  The patient returns today for routine follow-up.  The recent films were presented in our multidisciplinary conference with neuroradiology just prior to the clinic.                                ALLERGIES:  has No Known Allergies.  Meds: Current Outpatient Prescriptions  Medication Sig Dispense Refill  . ACCU-CHEK FASTCLIX LANCETS MISC 1 each by Does not apply route daily.    Marland Kitchen azithromycin (ZITHROMAX) 250 MG tablet Take 1 tablet (250 mg total) by mouth daily. Take first 2 tablets together, then 1 every day until finished. 6 tablet 0  . gabapentin (NEURONTIN) 400 MG capsule Take 2 capsules (800 mg total) by mouth 3 (three) times daily. 180 capsule 2  . glimepiride (AMARYL) 4 MG tablet Take 1 tablet (4 mg total) by mouth 2 (two) times daily. 60 tablet 3  . guaiFENesin-codeine 100-10 MG/5ML syrup Take 5 mLs by mouth every 6 (six) hours as needed for cough. 473 mL 0  . lidocaine-prilocaine (EMLA) cream Apply 1 application topically as needed (for port access).    . morphine (MSIR) 15 MG tablet Take 1 tablet (15 mg total) by mouth every 4 (four) hours as needed. 60 tablet 0  . OxyCODONE (OXYCONTIN) 15 mg T12A 12 hr tablet Take 15 mg by mouth  every 12 (twelve) hours.    . ramipril (ALTACE) 2.5 MG capsule Take 2.5 mg by mouth every morning.    . simvastatin (ZOCOR) 20 MG tablet Take 20 mg by mouth at bedtime.     . sucralfate (CARAFATE) 1 GM/10ML suspension Take 10 mLs (1 g total) by mouth 4 (four) times daily -  with meals and at bedtime. 420 mL 3   No current facility-administered medications for this encounter.   Facility-Administered Medications Ordered in Other Encounters  Medication Dose Route Frequency Provider Last Rate Last Dose  . gadobenate dimeglumine (MULTIHANCE) injection 20 mL  20 mL Intravenous Once PRN Medication Radiologist, MD        Physical Findings: The patient is in no acute distress. Patient is alert and oriented.  vitals were  not taken for this visit..  No significant changes.  Lab Findings: Lab Results  Component Value Date   WBC 7.1 10/25/2014   HGB 12.5* 10/25/2014   HCT 39.4 10/25/2014   MCV 84.9 10/25/2014   PLT 191 10/25/2014    @LASTCHEM @  Radiographic Findings: Dg Chest 2 View  10/23/2014   CLINICAL DATA:  Cough and congestion for 3 weeks.  EXAM: CHEST  2 VIEW  COMPARISON:  Chest x-ray June 22, 2014  FINDINGS: The heart size and mediastinal contours are stable. Emphysematous changes of bilateral lungs particularly in the upper lobes are unchanged. Chronic increased pulmonary interstitium are identified in bilateral lung bases unchanged. There is scar in the left lung base. There is no focal infiltrate, pulmonary edema, or pleural effusion. The visualized skeletal structures are stable.  IMPRESSION: No active cardiopulmonary disease. Emphysematous changes stable compared to prior exam.   Electronically Signed   By: Abelardo Diesel M.D.   On: 10/23/2014 14:59   Ct Chest W Contrast  10/18/2014   CLINICAL DATA:  Lung cancer with brain metastases, chemotherapy underlying, XRT complete. Cough, shortness of breath.  EXAM: CT CHEST WITH CONTRAST  TECHNIQUE: Multidetector CT imaging of the chest was performed during intravenous contrast administration.  CONTRAST:  56mL OMNIPAQUE IOHEXOL 300 MG/ML  SOLN  COMPARISON:  08/04/2014  FINDINGS: Medial left upper lobe/ paramediastinal radiation changes.  4 mm nodule in the lateral right middle lobe (series 5/ image 39), previously 4 mm. No new/suspicious pulmonary nodules.  Underlying moderate to severe emphysematous changes with bullous changes in the left lung apex. Mild pleural thickening on the left. No pleural effusion or pneumothorax.  Visualized thyroid is unremarkable.  Heart is normal in size. No pericardial effusion. Coronary atherosclerosis. Mild atherosclerotic calcifications of the aortic arch.  Right chest port terminates at the cavoatrial junction.  Residual  prevascular/AP window nodal soft tissue measuring 1.5 x 3.2 cm (series 2/ image 26), grossly unchanged. Additional small mediastinal nodes, including an 8 mm short axis right paratracheal node (series 2/image 26) and a 9 mm short axis subcarinal node (series 2/ image 34), unchanged.  Visualized upper abdomen is notable for layering gallstones in a small right renal cyst.  Visualized osseous structures are within normal limits.  IMPRESSION: Radiation changes in the medial left upper lobe/ paramediastinal region.  Stable thoracic lymphadenopathy, as above.  4 mm nodule in the right middle lobe, stable versus mildly decreased.  No evidence of new/ progressive metastatic disease in the chest.   Electronically Signed   By: Julian Hy M.D.   On: 10/18/2014 08:40    Impression:  The patient is recovering from the effects of radiation.  He does have a  cough and pain.  Plan:  Given oxy IR and will follow-up at 3 months with MRI  _____________________________________  Sheral Apley. Tammi Klippel, M.D.

## 2014-11-08 ENCOUNTER — Telehealth: Payer: Self-pay | Admitting: *Deleted

## 2014-11-08 ENCOUNTER — Ambulatory Visit
Admission: RE | Admit: 2014-11-08 | Discharge: 2014-11-08 | Disposition: A | Discharge status: Home / Self Care | Payer: Medicaid Other | Source: Ambulatory Visit | Attending: Radiation Oncology | Admitting: Radiation Oncology

## 2014-11-08 ENCOUNTER — Encounter: Payer: Self-pay | Admitting: Radiation Oncology

## 2014-11-08 VITALS — BP 127/66 | HR 69 | Temp 99.2°F | Resp 16 | Wt 234.8 lb

## 2014-11-08 DIAGNOSIS — C7931 Secondary malignant neoplasm of brain: Secondary | ICD-10-CM

## 2014-11-08 MED ORDER — OXYCODONE HCL 5 MG PO TABS: 5.0000 mg | ORAL_TABLET | ORAL | Status: AC | PRN

## 2014-11-08 NOTE — Progress Notes (Signed)
Weight and vitals stable. Requesting oxycontin refill. Here to review MRI from 11/23. Reports frontal headache today and chronic low back pain. Reports productive cough with white to yellow sputum. Denies hemoptysis. Reports SOB with exertion. Reports intermittent ringing in his ears. Reports tearing of both eyes is less but, each morning upon waking his eyes are crusty. Denies dizziness. Denies nausea or vomiting.

## 2014-11-08 NOTE — Telephone Encounter (Signed)
Pt states he is moving unexpectedly out of town and "is not coming back."  He asks what he needs to do to maintain his Port?   Asked pt where he is going so we can make a referral and send his records.  Encouraged pt to establish care with Oncologist in new town as soon as possible.  He will need f/u and have PAC flushed every 4 to 6 weeks.  Pt verbalized understanding.  He does not know exactly where he will be living yet but somewhere close to [facility] or [place], [place].   He will call back when he has a new address.  He did not elaborate on reason or nature for his sudden move.

## 2014-11-08 NOTE — Progress Notes (Signed)
Radiation Oncology         [phone] ________________________________  Name: Rodney Powers MRN: [id number]  Date: 11/08/2014  DOB: [birth year]  Follow-Up Visit Note  CC: Lorayne Marek, MD  Lorayne Marek, MD  Diagnosis:   58 yo man s/p SRS and surgery for 2 brain mets from s/p SRS on 07/17/2014   1. Right posterior temporal 2.7 cm target was treated using 4 Dynamic Conformal Arcs to a prescription dose of 14 Gy. ExacTrac registration was performed for each couch angle. The 84% isodose line was prescribed.   2. Right anterior temporal 1.7 cm target was treated using 4 Dynamic Conformal Arcs to a prescription dose of 20 Gy. ExacTrac registration was performed for each couch angle. The 82.6% isodose line was prescribed    ICD-9-CM ICD-10-CM   1. Brain metastases 198.3 C79.31     Interval Since Last Radiation:  3  months  Narrative:  The patient returns today for routine follow-up.  Weight and vitals stable. Requesting oxycontin refill. Here to review MRI from 11/23. Reports frontal headache today and chronic low back pain. Reports productive cough with white to yellow sputum. Denies hemoptysis. Reports SOB with exertion. Reports intermittent ringing in his ears. Reports tearing of both eyes is less but, each morning upon waking his eyes are crusty. Denies dizziness. Denies nausea or vomiting.                               ALLERGIES:  has No Known Allergies.  Meds: Current Outpatient Prescriptions  Medication Sig Dispense Refill  . ACCU-CHEK FASTCLIX LANCETS MISC 1 each by Does not apply route daily.    Marland Kitchen gabapentin (NEURONTIN) 400 MG capsule Take 2 capsules (800 mg total) by mouth 3 (three) times daily. 180 capsule 2  . glimepiride (AMARYL) 4 MG tablet Take 1 tablet (4 mg total) by mouth 2 (two) times daily. 60 tablet 3  . lidocaine-prilocaine (EMLA) cream Apply 1 application topically as needed (for port access).    . morphine (MSIR) 15 MG tablet Take 1  tablet (15 mg total) by mouth every 4 (four) hours as needed. 60 tablet 0  . ramipril (ALTACE) 2.5 MG capsule Take 2.5 mg by mouth every morning.    . simvastatin (ZOCOR) 20 MG tablet Take 20 mg by mouth at bedtime.     . sucralfate (CARAFATE) 1 GM/10ML suspension Take 10 mLs (1 g total) by mouth 4 (four) times daily -  with meals and at bedtime. 420 mL 3  . guaiFENesin-codeine 100-10 MG/5ML syrup Take 5 mLs by mouth every 6 (six) hours as needed for cough. (Patient not taking: Reported on 11/08/2014) 473 mL 0  . OxyCODONE (OXYCONTIN) 15 mg T12A 12 hr tablet Take 15 mg by mouth every 12 (twelve) hours.     No current facility-administered medications for this encounter.    Physical Findings: The patient is in no acute distress. Patient is alert and oriented.  weight is 234 lb 12.8 oz (106.505 kg). His oral temperature is 99.2 F (37.3 C). His blood pressure is 127/66 and his pulse is 69. His respiration is 16 and oxygen saturation is 97%. .  No significant changes.  Lab Findings: Lab Results  Component Value Date   WBC 7.1 10/25/2014   HGB 12.5* 10/25/2014   HCT 39.4 10/25/2014   MCV 84.9 10/25/2014   PLT 191 10/25/2014    @LASTCHEM @  Radiographic Findings: Dg Chest  2 View  10/23/2014   CLINICAL DATA:  Cough and congestion for 3 weeks.  EXAM: CHEST  2 VIEW  COMPARISON:  Chest x-ray June 22, 2014  FINDINGS: The heart size and mediastinal contours are stable. Emphysematous changes of bilateral lungs particularly in the upper lobes are unchanged. Chronic increased pulmonary interstitium are identified in bilateral lung bases unchanged. There is scar in the left lung base. There is no focal infiltrate, pulmonary edema, or pleural effusion. The visualized skeletal structures are stable.  IMPRESSION: No active cardiopulmonary disease. Emphysematous changes stable compared to prior exam.   Electronically Signed   By: Abelardo Diesel M.D.   On: 10/23/2014 14:59   Ct Chest W Contrast  10/18/2014    CLINICAL DATA:  Lung cancer with brain metastases, chemotherapy underlying, XRT complete. Cough, shortness of breath.  EXAM: CT CHEST WITH CONTRAST  TECHNIQUE: Multidetector CT imaging of the chest was performed during intravenous contrast administration.  CONTRAST:  6mL OMNIPAQUE IOHEXOL 300 MG/ML  SOLN  COMPARISON:  08/04/2014  FINDINGS: Medial left upper lobe/ paramediastinal radiation changes.  4 mm nodule in the lateral right middle lobe (series 5/ image 39), previously 4 mm. No new/suspicious pulmonary nodules.  Underlying moderate to severe emphysematous changes with bullous changes in the left lung apex. Mild pleural thickening on the left. No pleural effusion or pneumothorax.  Visualized thyroid is unremarkable.  Heart is normal in size. No pericardial effusion. Coronary atherosclerosis. Mild atherosclerotic calcifications of the aortic arch.  Right chest port terminates at the cavoatrial junction.  Residual prevascular/AP window nodal soft tissue measuring 1.5 x 3.2 cm (series 2/ image 26), grossly unchanged. Additional small mediastinal nodes, including an 8 mm short axis right paratracheal node (series 2/image 26) and a 9 mm short axis subcarinal node (series 2/ image 34), unchanged.  Visualized upper abdomen is notable for layering gallstones in a small right renal cyst.  Visualized osseous structures are within normal limits.  IMPRESSION: Radiation changes in the medial left upper lobe/ paramediastinal region.  Stable thoracic lymphadenopathy, as above.  4 mm nodule in the right middle lobe, stable versus mildly decreased.  No evidence of new/ progressive metastatic disease in the chest.   Electronically Signed   By: Julian Hy M.D.   On: 10/18/2014 08:40   Mr Jeri Cos TD Contrast  11/06/2014   CLINICAL DATA:  Metastatic lung cancer. Postop craniotomy and stereotactic radiosurgery. Three-month SRS restaging MRI  EXAM: MRI HEAD WITHOUT AND WITH CONTRAST  TECHNIQUE: Multiplanar, multiecho  pulse sequences of the brain and surrounding structures were obtained without and with intravenous contrast.  CONTRAST:  80mL MULTIHANCE GADOBENATE DIMEGLUMINE 529 MG/ML IV SOLN  COMPARISON:  MRI preop 07/18/2014 M postop 07/20/2014  FINDINGS: Postop resection of right temporal tip metastatic deposit with a small amount of central blood products in the surgical cavity. Following contrast infusion, there is some peripheral enhancement around the cavity suggestive of recurrent or residual tumor. This was present previously as well and is similar. Continued close followup is suggested. Improvement in right temporal lobe edema which is now mild.  Postop resection of right temporoparietal metastatic deposits. Postop cavity with hemosiderin is noted. Resolving infarct posterior to the surgical cavity. This area showed restricted diffusion previously and now shows encephalomalacia. There is mild enhancement along the posterior wall of the resection cavity, likely enhancement related to recent infarct however residual tumor not completely excluded and continued followup recommended. Greatly improved edema throughout the basal ganglia, right temporal lobe, and right  parietal lobe compared with the postoperative MRI.  Ventricle size is normal. No shift of the midline structures. No acute infarct.  No other metastatic deposits are identified  Bilateral mastoid sinus effusion. Mucosal edema in the paranasal sinuses bilaterally. Large right temporal subcutaneous lipoma unchanged from preoperative studies.  IMPRESSION: Resection of right temporal tip metastatic deposit. There is ring enhancement of the surgical cavity worrisome for residual tumor. Continued follow-up recommended.  Postop resection of right temporoparietal metastatic deposit. Enhancement along the posterior wall of the resection cavity likely is related enhancing subacute infarct which was identified on the postop MRI. Residual tumor not excluded and continued  close followup recommended  Greatly improved vasogenic edema throughout the right hemisphere. No midline shift.   Electronically Signed   By: Franchot Gallo M.D.   On: 11/06/2014 12:23    Impression:  The patient is recovering from the effects of radiation.  He does have a cough.  Plan:  Plan MRI in 3 months then follow-up. Given OxyIR for headaches and cough.   _____________________________________  Sheral Apley. Tammi Klippel, M.D.

## 2014-11-21 ENCOUNTER — Telehealth: Payer: Self-pay | Admitting: *Deleted

## 2014-11-21 NOTE — Telephone Encounter (Signed)
Received call from pt stating that he has moved to [place], [place].  There is a hospital near [place].  Pt would like a referral from Dr. Alvy Bimler to find a physician near his new residence. Pt's  Phone   [phone].

## 2014-11-22 ENCOUNTER — Encounter: Payer: Self-pay | Admitting: *Deleted

## 2014-11-22 NOTE — Progress Notes (Signed)
Pt requests referral to Oncologist in his new town.   Dr. Alvy Bimler asks for referral to be made to Oncology at [facility].  Request given to [facility] in [place].

## 2014-11-27 ENCOUNTER — Telehealth: Payer: Self-pay | Admitting: Hematology and Oncology

## 2014-11-27 NOTE — Telephone Encounter (Signed)
Faxed pt medical records to [facility]. After review of the records they will call pt with appt.  Left pt vm  In ref to appt.

## 2015-01-04 ENCOUNTER — Encounter: Payer: Self-pay | Admitting: Radiation Therapy

## 2015-01-04 NOTE — Progress Notes (Signed)
I called Mr. Orsino to set up a follow-up in February for Dr. Tammi Klippel. Mr. Weakland informed me that he has moved to [place] and transferred his medical care to [facility] under the management of Dr. Andreas Ohm. He has asked that all future appointments here be cancelled. I will pass this along to Doctors Tammi Klippel, Kathyrn Sheriff and [place].   Mont Dutton R.T.(R)(T) [facility]   Radiation Oncology  Radiation Special Procedures Navigator Office: [phone]   Pager: [phone]  [facility] Website: [email or web address]

## 2015-02-05 ENCOUNTER — Other Ambulatory Visit: Payer: Medicaid Other

## 2015-02-05 ENCOUNTER — Other Ambulatory Visit (HOSPITAL_COMMUNITY): Payer: Medicaid Other

## 2015-02-05 ENCOUNTER — Ambulatory Visit (HOSPITAL_COMMUNITY): Payer: Medicaid Other

## 2015-02-06 ENCOUNTER — Ambulatory Visit: Payer: Medicaid Other | Admitting: Hematology and Oncology

## 2015-02-07 ENCOUNTER — Ambulatory Visit: Payer: Medicaid Other | Admitting: Radiation Oncology

## 2015-03-06 ENCOUNTER — Telehealth: Payer: Self-pay | Admitting: *Deleted

## 2015-03-06 NOTE — Telephone Encounter (Signed)
INSTRUCTED PT. TO GO TO [facility] TO SIGN A RELEASE OF RECORDS FORM WHICH WILL BE FAXED TO [facility]. THIS OFFICE WILL FAX RECORDS BACK TO [facility] AT [phone]. [place] PHONE AND FAX NUMBERS WERE GIVEN TO PT. IF THERE SHOULD BE ANY PROBLEMS.

## 2015-07-31 ENCOUNTER — Other Ambulatory Visit: Payer: Self-pay | Admitting: Internal Medicine

## 2015-08-01 ENCOUNTER — Other Ambulatory Visit: Payer: Self-pay

## 2016-11-14 DEATH — deceased
